# Patient Record
Sex: Female | Born: 1965 | Race: Black or African American | Hispanic: No | Marital: Single
Health system: Southern US, Academic
[De-identification: ages and names within clinical notes are randomized; demographics above are authoritative.]

## PROBLEM LIST (undated history)

## (undated) ENCOUNTER — Ambulatory Visit

## (undated) ENCOUNTER — Other Ambulatory Visit

## (undated) ENCOUNTER — Encounter

## (undated) ENCOUNTER — Encounter: Attending: Nephrology | Primary: Nephrology

## (undated) ENCOUNTER — Ambulatory Visit: Payer: MEDICARE

## (undated) ENCOUNTER — Inpatient Hospital Stay

## (undated) ENCOUNTER — Telehealth

## (undated) ENCOUNTER — Encounter: Payer: MEDICARE | Attending: Nephrology | Primary: Nephrology

## (undated) ENCOUNTER — Encounter: Attending: Adult Health | Primary: Adult Health

## (undated) ENCOUNTER — Ambulatory Visit: Payer: Medicare (Managed Care)

## (undated) ENCOUNTER — Ambulatory Visit: Payer: Medicare (Managed Care) | Attending: Nephrology | Primary: Nephrology

## (undated) ENCOUNTER — Ambulatory Visit: Payer: MEDICARE | Attending: Nephrology | Primary: Nephrology

## (undated) ENCOUNTER — Inpatient Hospital Stay: Payer: Medicare (Managed Care)

## (undated) ENCOUNTER — Encounter: Payer: MEDICARE | Attending: General Practice | Primary: General Practice

## (undated) ENCOUNTER — Non-Acute Institutional Stay: Payer: MEDICARE

## (undated) DIAGNOSIS — I871 Compression of vein: Secondary | ICD-10-CM

## (undated) DIAGNOSIS — E119 Type 2 diabetes mellitus without complications: Secondary | ICD-10-CM

## (undated) DIAGNOSIS — I499 Cardiac arrhythmia, unspecified: Secondary | ICD-10-CM

## (undated) DIAGNOSIS — Z8614 Personal history of Methicillin resistant Staphylococcus aureus infection: Secondary | ICD-10-CM

## (undated) DIAGNOSIS — D0512 Intraductal carcinoma in situ of left breast: Secondary | ICD-10-CM

## (undated) DIAGNOSIS — I639 Cerebral infarction, unspecified: Secondary | ICD-10-CM

## (undated) DIAGNOSIS — I340 Nonrheumatic mitral (valve) insufficiency: Secondary | ICD-10-CM

## (undated) DIAGNOSIS — T82868A Thrombosis of vascular prosthetic devices, implants and grafts, initial encounter: Secondary | ICD-10-CM

## (undated) DIAGNOSIS — G40909 Epilepsy, unspecified, not intractable, without status epilepticus: Secondary | ICD-10-CM

## (undated) DIAGNOSIS — Z992 Dependence on renal dialysis: Secondary | ICD-10-CM

## (undated) DIAGNOSIS — Z940 Kidney transplant status: Secondary | ICD-10-CM

## (undated) DIAGNOSIS — Z9289 Personal history of other medical treatment: Secondary | ICD-10-CM

## (undated) DIAGNOSIS — A419 Sepsis, unspecified organism: Secondary | ICD-10-CM

## (undated) DIAGNOSIS — F419 Anxiety disorder, unspecified: Secondary | ICD-10-CM

## (undated) DIAGNOSIS — F32A Depression, unspecified: Secondary | ICD-10-CM

## (undated) DIAGNOSIS — R0789 Other chest pain: Secondary | ICD-10-CM

## (undated) DIAGNOSIS — N186 End stage renal disease: Secondary | ICD-10-CM

## (undated) DIAGNOSIS — I619 Nontraumatic intracerebral hemorrhage, unspecified: Secondary | ICD-10-CM

## (undated) DIAGNOSIS — I219 Acute myocardial infarction, unspecified: Secondary | ICD-10-CM

## (undated) DIAGNOSIS — K219 Gastro-esophageal reflux disease without esophagitis: Secondary | ICD-10-CM

## (undated) DIAGNOSIS — I10 Essential (primary) hypertension: Secondary | ICD-10-CM

## (undated) DIAGNOSIS — R569 Unspecified convulsions: Secondary | ICD-10-CM

## (undated) DIAGNOSIS — D7582 Heparin induced thrombocytopenia (HIT): Secondary | ICD-10-CM

## (undated) DIAGNOSIS — G2581 Restless legs syndrome: Secondary | ICD-10-CM

## (undated) DIAGNOSIS — D75829 Heparin-induced thrombocytopenia, unspecified: Secondary | ICD-10-CM

## (undated) DIAGNOSIS — M1990 Unspecified osteoarthritis, unspecified site: Secondary | ICD-10-CM

## (undated) HISTORY — DX: Essential (primary) hypertension: I10

## (undated) HISTORY — DX: Kidney transplant status: Z94.0

## (undated) HISTORY — DX: Other chest pain: R07.89

## (undated) HISTORY — DX: Gastro-esophageal reflux disease without esophagitis: K21.9

## (undated) HISTORY — DX: Thrombosis due to vascular prosthetic devices, implants and grafts, initial encounter: T82.868A

## (undated) HISTORY — DX: Dependence on renal dialysis: Z99.2

## (undated) HISTORY — DX: Cerebral infarction, unspecified: I63.9

## (undated) HISTORY — PX: INSERTION OF DIALYSIS CATHETER: SHX1324

## (undated) HISTORY — DX: Personal history of other medical treatment: Z92.89

## (undated) HISTORY — DX: Heparin induced thrombocytopenia (HIT): D75.82

## (undated) HISTORY — DX: Intraductal carcinoma in situ of left breast: D05.12

## (undated) HISTORY — DX: Nonrheumatic mitral (valve) insufficiency: I34.0

## (undated) HISTORY — DX: Heparin-induced thrombocytopenia, unspecified: D75.829

## (undated) HISTORY — DX: Restless legs syndrome: G25.81

## (undated) HISTORY — DX: Epilepsy, unspecified, not intractable, without status epilepticus: G40.909

## (undated) HISTORY — DX: End stage renal disease: N18.6

## (undated) HISTORY — DX: Sepsis, unspecified organism: A41.9

## (undated) HISTORY — DX: Cardiac arrhythmia, unspecified: I49.9

## (undated) HISTORY — DX: Nontraumatic intracerebral hemorrhage, unspecified: I61.9

## (undated) HISTORY — DX: Personal history of Methicillin resistant Staphylococcus aureus infection: Z86.14

## (undated) HISTORY — DX: Compression of vein: I87.1

---
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
## 2005-10-31 ENCOUNTER — Emergency Department: Payer: Self-pay | Admitting: General Practice

## 2006-04-08 ENCOUNTER — Ambulatory Visit: Payer: Self-pay

## 2007-02-12 ENCOUNTER — Ambulatory Visit: Payer: Self-pay | Admitting: Nephrology

## 2007-03-26 ENCOUNTER — Ambulatory Visit: Payer: Self-pay | Admitting: Nephrology

## 2007-06-16 ENCOUNTER — Ambulatory Visit: Payer: Self-pay | Admitting: Internal Medicine

## 2008-05-01 ENCOUNTER — Inpatient Hospital Stay: Payer: Self-pay | Admitting: *Deleted

## 2008-05-01 ENCOUNTER — Other Ambulatory Visit: Payer: Self-pay

## 2008-08-03 DIAGNOSIS — I639 Cerebral infarction, unspecified: Secondary | ICD-10-CM

## 2008-08-03 HISTORY — DX: Cerebral infarction, unspecified: I63.9

## 2008-10-26 ENCOUNTER — Ambulatory Visit: Payer: Self-pay | Admitting: Pulmonary Disease

## 2008-10-26 ENCOUNTER — Inpatient Hospital Stay (HOSPITAL_COMMUNITY): Admission: AD | Admit: 2008-10-26 | Discharge: 2008-11-01 | Payer: Self-pay | Admitting: Internal Medicine

## 2008-10-26 ENCOUNTER — Emergency Department: Payer: Self-pay | Admitting: Emergency Medicine

## 2008-10-30 ENCOUNTER — Ambulatory Visit: Payer: Self-pay | Admitting: Physical Medicine & Rehabilitation

## 2008-11-08 ENCOUNTER — Emergency Department: Payer: Self-pay | Admitting: Unknown Physician Specialty

## 2008-11-10 ENCOUNTER — Emergency Department: Payer: Self-pay | Admitting: Unknown Physician Specialty

## 2008-11-24 ENCOUNTER — Ambulatory Visit: Payer: Self-pay | Admitting: Internal Medicine

## 2010-04-02 IMAGING — CR DG CHEST 1V PORT
1 series · 1 of 1 positions shown · non-contrast
Comparison: none

REASON FOR EXAM: Shortness of breath
COMMENTS:

[view not recorded]
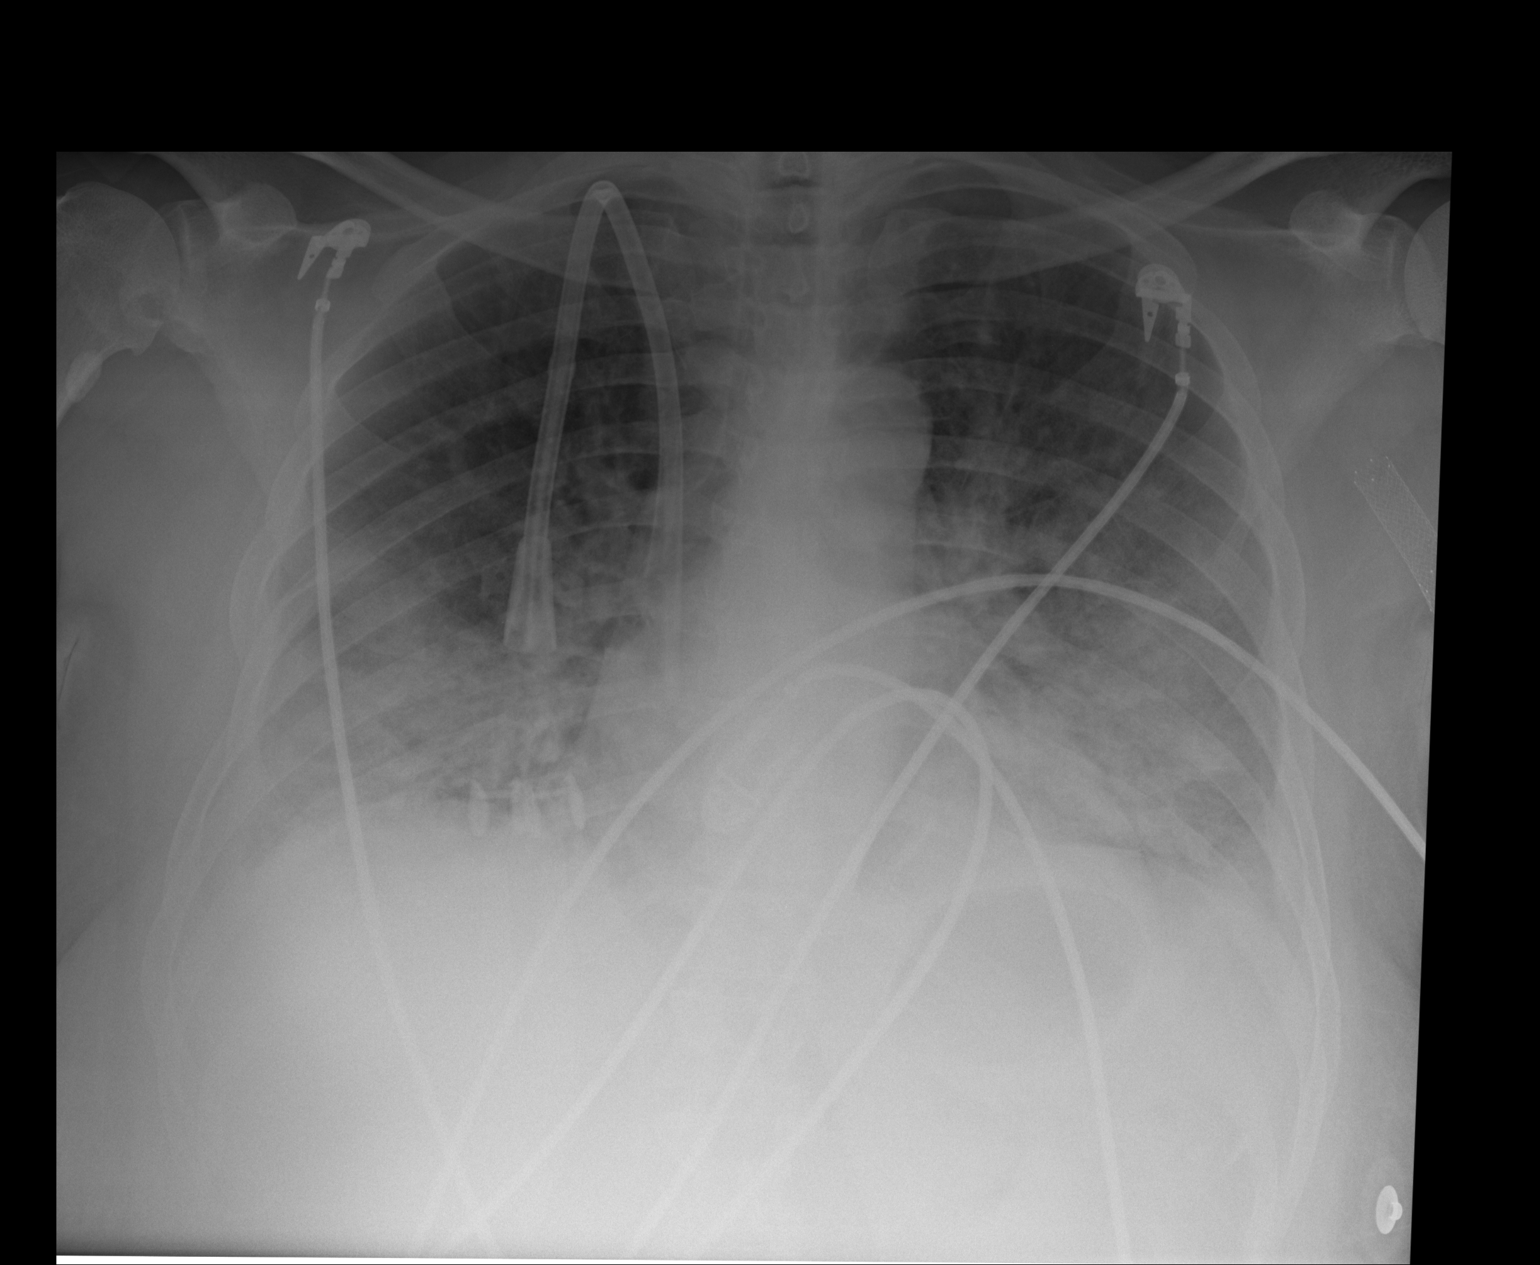

[1 of 1 positions shown; findings below may reference images not displayed]

PROCEDURE:     DXR - DXR PORTABLE CHEST SINGLE VIEW  - May 01, 2008  [DATE]

RESULT:     There is no previous exam available for comparison.

There is a large caliber central venous catheter present with two lumens.
The distal port is in the RIGHT atrium. The proximal port is in the inferior
portion of the superior vena cava. There is evidence of pulmonary vascular
congestion with pulmonary edema. Superimposed basilar pneumonia would be
difficult to completely exclude. Cardiac monitoring electrodes are present.
IMPRESSION: Pulmonary edema. Follow-up is recommended.

## 2010-04-19 ENCOUNTER — Emergency Department: Payer: Self-pay | Admitting: Emergency Medicine

## 2010-07-22 ENCOUNTER — Emergency Department: Payer: Self-pay | Admitting: Emergency Medicine

## 2010-09-01 ENCOUNTER — Emergency Department: Payer: Self-pay | Admitting: Emergency Medicine

## 2010-11-12 LAB — RENAL FUNCTION PANEL
BUN: 32 mg/dL — ABNORMAL HIGH (ref 6–23)
Chloride: 99 mEq/L (ref 96–112)
Glucose, Bld: 72 mg/dL (ref 70–99)
Potassium: 4.5 mEq/L (ref 3.5–5.1)

## 2010-11-13 LAB — CBC
Hemoglobin: 12.1 g/dL (ref 12.0–15.0)
Hemoglobin: 12.7 g/dL (ref 12.0–15.0)
MCHC: 33.9 g/dL (ref 30.0–36.0)
MCHC: 34.3 g/dL (ref 30.0–36.0)
MCHC: 34.5 g/dL (ref 30.0–36.0)
MCV: 95.6 fL (ref 78.0–100.0)
MCV: 95.6 fL (ref 78.0–100.0)
Platelets: 179 10*3/uL (ref 150–400)
Platelets: 263 10*3/uL (ref 150–400)
RBC: 3.54 MIL/uL — ABNORMAL LOW (ref 3.87–5.11)
RBC: 3.66 MIL/uL — ABNORMAL LOW (ref 3.87–5.11)
RBC: 3.85 MIL/uL — ABNORMAL LOW (ref 3.87–5.11)
WBC: 10 10*3/uL (ref 4.0–10.5)
WBC: 10.1 10*3/uL (ref 4.0–10.5)
WBC: 9.8 10*3/uL (ref 4.0–10.5)
WBC: 9.9 10*3/uL (ref 4.0–10.5)

## 2010-11-13 LAB — BASIC METABOLIC PANEL
BUN: 21 mg/dL (ref 6–23)
CO2: 24 mEq/L (ref 19–32)
CO2: 26 mEq/L (ref 19–32)
Calcium: 7.9 mg/dL — ABNORMAL LOW (ref 8.4–10.5)
Calcium: 8.6 mg/dL (ref 8.4–10.5)
Calcium: 9 mg/dL (ref 8.4–10.5)
Chloride: 106 mEq/L (ref 96–112)
Creatinine, Ser: 10.64 mg/dL — ABNORMAL HIGH (ref 0.4–1.2)
Creatinine, Ser: 11.19 mg/dL — ABNORMAL HIGH (ref 0.4–1.2)
GFR calc Af Amer: 4 mL/min — ABNORMAL LOW (ref >60)
GFR calc Af Amer: 5 mL/min — ABNORMAL LOW (ref >60)
GFR calc non Af Amer: 3 mL/min — ABNORMAL LOW (ref >60)
GFR calc non Af Amer: 4 mL/min — ABNORMAL LOW (ref >60)
Glucose, Bld: 103 mg/dL — ABNORMAL HIGH (ref 70–99)
Sodium: 135 mEq/L (ref 135–145)
Sodium: 138 mEq/L (ref 135–145)

## 2010-11-13 LAB — BLOOD GAS, ARTERIAL
Acid-Base Excess: 2.5 mmol/L — ABNORMAL HIGH (ref 0.0–2.0)
Acid-base deficit: 1.5 mmol/L (ref 0.0–2.0)
Acid-base deficit: 1.6 mmol/L (ref 0.0–2.0)
Acid-base deficit: 1.8 mmol/L (ref 0.0–2.0)
Bicarbonate: 22.8 mEq/L (ref 20.0–24.0)
Bicarbonate: 23.8 mEq/L (ref 20.0–24.0)
Drawn by: 311491
FIO2: 0.4 %
FIO2: 0.4 %
FIO2: 100 %
MECHVT: 500 mL
O2 Saturation: 93.7 %
O2 Saturation: 98.6 %
O2 Saturation: 98.7 %
PEEP: 5 cmH2O
PEEP: 5 cmH2O
Patient temperature: 100
Patient temperature: 98.1
RATE: 12 resp/min
RATE: 12 resp/min
pCO2 arterial: 35.6 mmHg (ref 35.0–45.0)
pCO2 arterial: 44.3 mmHg (ref 35.0–45.0)
pH, Arterial: 7.397 (ref 7.350–7.400)
pO2, Arterial: 104 mmHg — ABNORMAL HIGH (ref 80.0–100.0)
pO2, Arterial: 130 mmHg — ABNORMAL HIGH (ref 80.0–100.0)
pO2, Arterial: 326 mmHg — ABNORMAL HIGH (ref 80.0–100.0)

## 2010-11-13 LAB — RENAL FUNCTION PANEL
Albumin: 2.7 g/dL — ABNORMAL LOW (ref 3.5–5.2)
Albumin: 3 g/dL — ABNORMAL LOW (ref 3.5–5.2)
Calcium: 8.6 mg/dL (ref 8.4–10.5)
Chloride: 104 mEq/L (ref 96–112)
Chloride: 105 mEq/L (ref 96–112)
Creatinine, Ser: 12.94 mg/dL — ABNORMAL HIGH (ref 0.4–1.2)
GFR calc Af Amer: 3 mL/min — ABNORMAL LOW (ref >60)
GFR calc Af Amer: 4 mL/min — ABNORMAL LOW (ref >60)
GFR calc non Af Amer: 3 mL/min — ABNORMAL LOW (ref >60)
GFR calc non Af Amer: 3 mL/min — ABNORMAL LOW (ref >60)
Phosphorus: 5.3 mg/dL — ABNORMAL HIGH (ref 2.3–4.6)
Phosphorus: 5.3 mg/dL — ABNORMAL HIGH (ref 2.3–4.6)
Potassium: 6.5 mEq/L (ref 3.5–5.1)

## 2010-11-13 LAB — GLUCOSE, CAPILLARY
Glucose-Capillary: 102 mg/dL — ABNORMAL HIGH (ref 70–99)
Glucose-Capillary: 105 mg/dL — ABNORMAL HIGH (ref 70–99)
Glucose-Capillary: 105 mg/dL — ABNORMAL HIGH (ref 70–99)
Glucose-Capillary: 110 mg/dL — ABNORMAL HIGH (ref 70–99)
Glucose-Capillary: 85 mg/dL (ref 70–99)
Glucose-Capillary: 88 mg/dL (ref 70–99)
Glucose-Capillary: 99 mg/dL (ref 70–99)

## 2010-11-13 LAB — HEPATIC FUNCTION PANEL
Alkaline Phosphatase: 124 U/L — ABNORMAL HIGH (ref 39–117)
Bilirubin, Direct: 0.2 mg/dL (ref 0.0–0.3)
Indirect Bilirubin: 0.5 mg/dL (ref 0.3–0.9)
Total Bilirubin: 0.7 mg/dL (ref 0.3–1.2)

## 2010-11-13 LAB — MAGNESIUM
Magnesium: 2.2 mg/dL (ref 1.5–2.5)
Magnesium: 2.4 mg/dL (ref 1.5–2.5)

## 2010-11-13 LAB — APTT: aPTT: 29 seconds (ref 24–37)

## 2010-11-13 LAB — PROTIME-INR: Prothrombin Time: 13.9 seconds (ref 11.6–15.2)

## 2010-11-13 LAB — COMPREHENSIVE METABOLIC PANEL
CO2: 26 mEq/L (ref 19–32)
Calcium: 8.4 mg/dL (ref 8.4–10.5)
Creatinine, Ser: 10.97 mg/dL — ABNORMAL HIGH (ref 0.4–1.2)
GFR calc non Af Amer: 4 mL/min — ABNORMAL LOW (ref >60)
Glucose, Bld: 101 mg/dL — ABNORMAL HIGH (ref 70–99)

## 2010-12-16 NOTE — Procedures (Signed)
CLINICAL HISTORY:  A 46 year old female was found to have seizure, CT  scan demonstrate a right frontal lobe bleeding.   CURRENT MEDICATIONS:  Dilantin, Coumadin, Cozaar, clonidine, Norvasc,  Tylenol, Renagel, Nasonex, Versed, Fentanyl.   TECHNICAL COMMENT:  An 18-channel EEG was performed based on standard  international 10-20 system.  Total recording time is 21.2 minutes,  seventeenth channel dedicated to the EKG which has demonstrated normal  sinus rhythm of 78 beats per minute.   Upon awakening, the posterior background activity was diffusely low in  the theta range.  The anterior background activity was in the slower  range delta range activity, very low amplitude.   There are also intermittent right side slower activity, but there is no  evidence of epileptiform discharge.  There was occasionally C4 sharp  transient noticed, there was also frequent pulse artifact, but there was  no evidence of epileptiform discharge.  Photic stimulation and  hyperventilation was not performed.   In conclusion, this is an abnormal study because generalized slowing  indicating a cerebral dysfunction,  could be due to sedative effect of  Versed,fentanyl, or metabolic -toxic reasons.  There is no evidence of  epileptiform discharge.      Marcial Pacas, MD  Electronically Signed     [id number]  D:  10/26/2008 22:35:45  T:  10/27/2008 05:00:14  Job #:  [id number]

## 2010-12-16 NOTE — Consult Note (Signed)
[name], [name]              ACCOUNT NO.:  [id number]   MEDICAL RECORD NO.:  [id number]          PATIENT TYPE:  INP   LOCATION:  3109                         FACILITY:  [place]   PHYSICIAN:  Kary Kos, M.D.        DATE OF BIRTH:  10/27/1965   DATE OF CONSULTATION:  10/26/2008  DATE OF DISCHARGE:                                 CONSULTATION   REASON FOR CONSULTATION:  Right frontal lobar intracerebral hematoma.   HISTORY OF PRESENT ILLNESS:  The patient is a 45 year old female with  limited available history, but was known as she has end-stage renal  disease, history of HITT, CHF, hypertension who presented after being  found by her boyfriend seizing.  EMS picked her up.  No witnessed  seizures by EMS.  The patient was intubated, taken to [place].  On  arrival to [place], reportedly the patient's systolic pressures were in  excess of 200, and the patient underwent workup including a CT of her  head showing a lobar hypertensive hemorrhage and the patient was  transferred here.  Reportedly, the patient also has been on Coumadin and  anticoagulated.  Although the coags from [place] show her INR to be  1.1.   Remainder of her past medical history is not available nor surgical  history at this time.   PHYSICAL EXAMINATION:  The patient is obtunded and sedated on Diprivan.  Pupils are 2-mm and nonreactive.  She appears to be symmetric,  purposeful upper and lower extremities, but certainly does not follow  command secondary to sedation.   On review of her CT scan, there is a small 3 x 4 cm right frontal  intracerebral hematoma with very minimal mass effect, no midline shift,  all local.  No other masses, lesions, or hemorrhages visualized.   ASSESSMENT:  This is a 45 year old female with significant past medical  history and coagulopathy who probably has a hypertensive hemorrhage,  though etiology is still somewhat unclear.  History is also limited at  this time until  additional family will come and be available to obtain  from.  The patient has been admitted to critical care.  Recommend  continued aggressive hypertensive management.  Continue reverse  coagulopathy, although INR is already 1.1 so most wound healing being  done was probably dysfunctional platelets, seizure prophylaxis, and  initiate Dilantin.  Neurology consult for management of seizures and  also continue workup etiology of the hemorrhage.           ______________________________  Kary Kos, M.D.     GC/MEDQ  D:  10/26/2008  T:  10/26/2008  Job:  [id number]

## 2010-12-16 NOTE — Consult Note (Signed)
[name], [name]              ACCOUNT NO.:  [id number]   MEDICAL RECORD NO.:  [id number]          PATIENT TYPE:  INP   LOCATION:  3109                         FACILITY:  [place]   PHYSICIAN:  Marcial Pacas, MD          DATE OF BIRTH:  10/27/1965   DATE OF CONSULTATION:  DATE OF DISCHARGE:                                 CONSULTATION   CHIEF COMPLAINT:  Intracranial bleeding and seizure.   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  CT of the brain showed a 4 x 3 x 6 cm right  frontal lobe acute hemorrhage, later transferred to [facility] for continued care.   Neurology is consulted for management of intracranial hemorrhage and  seizure.   REVIEW OF SYSTEMS:  Not obtainable.   PAST MEDICAL HISTORY:  Hypertension, chronic renal insufficiency,  hemodialysis dependent, heparin-induced thrombocytopenia, congestive  heart failure, Coumadin for unknown reasons.   PAST SURGICAL HISTORY:  Unknown.   FAMILY HISTORY:  Unknown.   SOCIAL HISTORY:  Not obtainable.   HOME MEDICATION:  Amlodipine, atenolol, clonidine, Cozaar, Prevacid,  renal tab multivitamin, Sensipar, and Coumadin 4 mg every day.   ALLERGIES:  HEPARIN, IBUPROFEN, PENICILLIN.   PHYSICAL EXAMINATION:  GENERAL:  She is intubated; on low-dose sedation,  Versed and fentanyl.  VITAL SIGNS:  Heart rate was is 80s, blood pressure now is 140/80.  CARDIAC:  Regular rate and  rhythm.  PULMONARY:  Clear to auscultation bilaterally.  NECK:  Supple.  No carotid bruits.  NEUROLOGIC:  She is moving four extremities, and on stimulation.  Cranial nerves II through XII, pupil was small, sluggish, reactive,  doll's eyes present, cornea brisk, breathing over vent.  Motor  examination, moves all extremities, withdrawal to pain, and seems to be  fairly symmetric.  Deep tendon reflexes were brisk, symmetric.  Plantar  responses were flexor.   CT of the head without contrast from [facility]  reviewed.  There was right frontal acute hemorrhage, 4 x 3 cm, involving  6 slice, calculated volume around 36 mL.   LABORATORY EVALUATION:  CMP, low sodium 133, elevated potassium 5.0,  creatinine 11, normal liver functional tests.  INR was 1.0, CBC elevated  WBC of 13.7.   ASSESSMENT AND PLAN:  A 45 year old female, with intracranial  hemorrhage, most likely due to hypertension.  Also, has comorbidity of  end-stage renal disease hemodialysis dependent, was on Coumadin  treatment for left upper extremity DVT.  1. Hold off anticoagulation.  2. Blood pressure control goal is 140-160/80-90.  3. Repeat CT head without contrast this afternoon.  4. Load with  Dilantin 20 mg/kg and then 100 mg t.i.d., based on      current examination, she is no longer actively seizing, EEG.  5. Continue hemodialysis.      Marcial Pacas, MD  Electronically Signed     YY/MEDQ  D:  10/26/2008  T:  10/27/2008  Job:  [id number]

## 2010-12-19 NOTE — Discharge Summary (Signed)
[name], [name]              ACCOUNT NO.:  [id number]   MEDICAL RECORD NO.:  [id number]          PATIENT TYPE:  INP   LOCATION:  6706                         FACILITY:  [place]   PHYSICIAN:  Donato Heinz, M.D.DATE OF BIRTH:  [birth year]   DATE OF ADMISSION:  10/26/2008  DATE OF DISCHARGE:  11/01/2008                               DISCHARGE SUMMARY   ADMISSION DIAGNOSES:  77. A 45 year old black female with acute right frontal hemorrhage.  2. Malignant hypertension.  3. Seizure activity.  4. History of heparin-induced thrombocytopenia-thrombosis.  5. History of end-stage renal disease on chronic hemodialysis.  6. History of congestive heart failure.   DISCHARGE DIAGNOSES:  1. Right frontal hemorrhage/cerebrovascular accident.  2. Malignant hypertension.  3. Seizure disorder.  4. End-stage renal disease on chronic hemodialysis.  5. Anemia.  6. Secondary hyperparathyroidism.  7. History of heparin-induced thrombocytopenia.  8. Nonfunctional arteriovenous fistula status post successful      dilatation of outflow vein stenosis by fistulogram and angioplasty.   BRIEF HISTORY/REASON FOR THE ADMISSION:  [name] is a 45 year old  black female with history of end-stage renal disease on chronic  hemodialysis at the [facility] followed by Dr. Rudi Coco at the [facility] found by her boyfriend seizing for an unknown  length of time and presented to [facility].  Initially,  she was noted then to have recurrent seizure with difficulty protecting  airway, subsequently intubated, blood pressure was elevated at 200/120  on presentation.  CT scan of her brain showed a 4 x 3 x 6-cm right  frontal lobe acute hemorrhage.  They transferred to [place] were continued to care.  Neurology was consulted  for management of intracranial hemorrhage and seizure.   PHYSICAL EXAMINATION:  On admission by Dr. Krista Blue showed:  GENERAL APPEARANCE:  A black female intubated on low-dose of adenosine  with Versed and fentanyl.  VITAL SIGNS:  Blood pressure was now 140/80, heart rate was in the 80s.  HEART:  She had regular rate and rhythm auscultated.  PULMONARY:  Lungs are clear to auscultation.  NECK:  Supple.  No carotid bruits heard.  NEUROLOGIC:  She was moving all 4 extremities upon stimulation.  Cranial  nerves II through XII, pupils were small and sluggish,  reactive.  Dull  eyes present, corneal brisk, breathing over the vent.  MOTOR:  Moves all extremities, withdraws to pain, seems to be fairly  symmetric.  Deep tendon reflexes were brisk, symmetric.  Plantar  responses were flexor.   CT scan of her head without contrast from [facility]  revealed that there was a right frontal acute hemorrhage 4 x 6 involving  6-slice calculated volume of around 36 mm.   LABORATORY EVALUATION:  CMP showed a sodium of 133, potassium 5.0, and  creatinine 11.  Normal liver function tests.  INR was 1.0.  CBC showed a  white count of 13.7.   INITIAL ASSESSMENT AND PLAN:  A 45 year old black female with  intracranial hemorrhage, most likely due to hypertension, had been on  Coumadin for treatment of  left upper extremity deep venous thrombosis.  Plan was to hold off anticoagulation for this time to control her blood  pressure having a goal range of 140-160/80-90 initially.  Repeat CT scan  of her head without contrast.  Load with Dilantin and continue on  hemodialysis as scheduled.   COURSE IN THE HOSPITAL:  1. Acute right frontal hemorrhage.  The patient was seen in consult by      Critical Care Service and Neurology Service.  Neurosurgical Service      was consulted initially.  Note:  She is a nonsurgical candidate at      this time, wanted Neurology Service to consult etiology of      hematoma, which was thought to be probably malignant hypertension,      which caused the seizing and bleed.  She was  loaded with IV      Dilantin.  Blood pressure was maintained and levels noted on      admission to prevent significant drops.  Vent was managed by      Critical Care Service.  An MRA of the head without contrast on      October 29, 2008.  Exam quality was limited by the patient's motion.      There is no large vessel occlusion, probable atherosclerotic      disease in both vertebral arteries.  Noted acute hematoma at the      right frontal lobe unchanged from prior CT scan most likely primary      hemorrhage related to the patient's hypertension.  Underlying      vascular malformation or amyloid angio were not enlarged.  There      was no acute ischemic infarct.  The right frontal hematoma measured      39 x 23 cm.   Note, the patient was on, at home, the medication list of amlodipine,  atenolol, clonidine, and Cozaar for hypertension.  There is a  questionable history of noncompliance, but this is on background of flow  sheets.  Diastolics are 0000000 and elevated systolics on all flow sheet  at the [facility].  __________hemodialysis weight gain is  5.   Note, the patient was extubated at a.m. on October 28, 2008.  Blood  pressure was somewhat elevated.  Started on p.o. Cozaar, clonidine, and  Norvasc.  1. Hypertension.  The patient was feeling better with blood pressure      fairly stable at 172/108 near the time of discharge and deemed      might be discharged to home by Dr. Marval Regal.  The patient was up,      ambulating.  For blood pressure medications, she did ask for      refills on her clonidine, amlodipine, which are written for her.      She was told to be more compliant or she might have another seizure-      type activity and more bleed.  2. End-stage renal disease.  The patient was continued on      hemodialysis.  She was using a right IJ PermCath, which was      inserted in some at the time at [facility] area.  Note, she did      have a fistulogram was to  be scheduled at [facility], but this was      ordered by the Renal Service and done on October 31, 2008, by      Radiology here.  She had successful dilatation of  outflow vein      stenosis in antecubital fossa to 6 mm.  She had some elevated      venous pressures noted when her fistula was attempted to be used      here.  Last day of dialysis on November 01, 2008, and we were using a      PermCath, was to use her AV fistula, when she returned to her home      unit in [place] by Dr. Rudi Coco to follow up on that.   Note, she is followed by [facility], Dr. Rudi Coco at  [facility] on Tuesday, Thursday, and Saturday as scheduled.  Her  dry weight will be 73.0 kg.  She will continue to use no-heparin  hemodialysis because of her intracranial bleed and history of positive  HIT.  1. Seizure disorder.  The patient was loaded initially on IV Dilantin      and continued on p.o. Dilantin at 300 mg at bedtime as ordered by      Neurology.  She will need to have a followup __________ total dose      of Dilantin as an outpatient at the dialysis unit.  Neurology      __________ the patient was noted on November 01, 2008, ready for      discharge to home with home health RN to continue with physical      therapy.  Follow up with Dr. Krista Blue in 2 months' time.  The MRI from      October 29, 2008, showed right frontal hemorrhage unchanged from      previous CAT scan.  Again, noted MRA showed no large vessel      occlusion.  Neurology exam on the day of discharge appeared cranial      nerve II through XII were normal per Neurology and her muscle tone      was normal, 5/5.  Sensation was intact.  2. Anemia.  The patient had a hemoglobin of 9.9 at the time of      discharge.  She is on Epogen at 2000 units every hemodialysis.  3. Secondary hyperparathyroidism.  She was noted to have calcium of      8.1, phosphorous of 4.8, in the setting of albumin at 2.7.  She is      on Sensipar 60 mg  daily, Renagel 800 mg 2 each meal, not on Vitamin      D by her records given during hospital stay.  Follow PTH per Dr.      Mervyn Skeeters as an outpatient at hemodialysis unit.  4. The patient noted in the past to have DVT and had been on Coumadin      as an outpatient, but this was stopped because of her bleed.  She      was noted to have subtherapeutic INR from outpatient records on      October 25, 2008, with INR noted below 1.5.   MEDICATIONS AT THE TIME OF DISCHARGE:  1. Clonidine/Catapres patch TTS-3 one to chest, every Saturday.  2. Amlodipine 5 mg at bedtime.  3. Cozaar 50 mg 2 at bedtime.  4. Sensipar 60 mg daily.  5. Renagel 800 mg 2 tablets each meal t.i.d.  6. Nephro-Vite daily.  7. Protonix 40 mg daily.  8. Dilantin 100 mg 3 at bedtime.  9. Lopressor 50 mg b.i.d.  10.Outpatient hemodialysis, Epogen 2000 every hemodialysis.   DIET:  Renal-failure diet, limit total fluids to 1200 mL  daily.   ACTIVITY:  As tolerated.  Have home health RN, PT, see the patient.   WOUND CARE:  Told to keep her PermCath dry and clean and no showers.   FOLLOWUP:  Follow up with the [facility] on Tuesday,  Thursday, and Saturday, Dr. Joycelyn Schmid Kiser's group.  Follow up with Dr.  Krista Blue, neurologist at [facility] in 2 months and she is instructed.   Discharge summary and planning took more than 30 minutes' time.      Foye Clock, P.A.    ______________________________  Donato Heinz, M.D.    DWZ/MEDQ  D:  11/07/2008  T:  11/08/2008  Job:  [id number]   cc:   Rudi Coco

## 2011-02-16 ENCOUNTER — Inpatient Hospital Stay: Payer: Self-pay | Admitting: Internal Medicine

## 2011-12-14 LAB — HM PAP SMEAR

## 2013-11-02 ENCOUNTER — Emergency Department: Payer: Self-pay | Admitting: Emergency Medicine

## 2013-11-02 LAB — COMPREHENSIVE METABOLIC PANEL
ALK PHOS: 286 U/L — AB
ALT: 19 U/L (ref 12–78)
Albumin: 3.6 g/dL (ref 3.4–5.0)
Anion Gap: 12 (ref 7–16)
BUN: 30 mg/dL — ABNORMAL HIGH (ref 7–18)
Bilirubin,Total: 0.3 mg/dL (ref 0.2–1.0)
CALCIUM: 8.1 mg/dL — AB (ref 8.5–10.1)
CREATININE: 10.81 mg/dL — AB (ref 0.60–1.30)
Chloride: 97 mmol/L — ABNORMAL LOW (ref 98–107)
Co2: 24 mmol/L (ref 21–32)
EGFR (African American): 4 — ABNORMAL LOW
EGFR (Non-African Amer.): 4 — ABNORMAL LOW
Glucose: 152 mg/dL — ABNORMAL HIGH (ref 65–99)
Osmolality: 276 (ref 275–301)
POTASSIUM: 3.9 mmol/L (ref 3.5–5.1)
SGOT(AST): 17 U/L (ref 15–37)
Sodium: 133 mmol/L — ABNORMAL LOW (ref 136–145)
Total Protein: 8.8 g/dL — ABNORMAL HIGH (ref 6.4–8.2)

## 2013-11-02 LAB — CK TOTAL AND CKMB (NOT AT ARMC)
CK, TOTAL: 202 U/L — AB
CK-MB: 1 ng/mL (ref 0.5–3.6)

## 2013-11-02 LAB — CBC
HCT: 44.2 % (ref 35.0–47.0)
HGB: 14.4 g/dL (ref 12.0–16.0)
MCH: 29.9 pg (ref 26.0–34.0)
MCHC: 32.5 g/dL (ref 32.0–36.0)
MCV: 92 fL (ref 80–100)
Platelet: 325 10*3/uL (ref 150–440)
RBC: 4.8 10*6/uL (ref 3.80–5.20)
RDW: 14.3 % (ref 11.5–14.5)
WBC: 8.9 10*3/uL (ref 3.6–11.0)

## 2013-11-02 LAB — APTT: Activated PTT: 38.3 secs — ABNORMAL HIGH (ref 23.6–35.9)

## 2013-11-02 LAB — PROTIME-INR
INR: 1
Prothrombin Time: 12.8 secs (ref 11.5–14.7)

## 2013-11-02 LAB — TROPONIN I: Troponin-I: <0.02 ng/mL

## 2013-11-03 ENCOUNTER — Telehealth: Payer: Self-pay

## 2013-11-03 NOTE — Telephone Encounter (Signed)
Called pt to sched f/u w/ Dr. Rockey Situ after visit to [facility] ED for arrhythmia on amiodarone.  Pt sched to be seen 11/17/13 @ 2:15.

## 2013-11-17 ENCOUNTER — Ambulatory Visit (INDEPENDENT_AMBULATORY_CARE_PROVIDER_SITE_OTHER): Payer: Medicare Other | Admitting: Cardiovascular Disease

## 2013-11-17 ENCOUNTER — Encounter: Payer: Self-pay | Admitting: Cardiovascular Disease

## 2013-11-17 ENCOUNTER — Encounter (INDEPENDENT_AMBULATORY_CARE_PROVIDER_SITE_OTHER): Payer: Self-pay

## 2013-11-17 VITALS — BP 128/98 | HR 80 | Ht 66.0 in | Wt 207.0 lb

## 2013-11-17 DIAGNOSIS — I498 Other specified cardiac arrhythmias: Secondary | ICD-10-CM

## 2013-11-17 DIAGNOSIS — I471 Supraventricular tachycardia: Secondary | ICD-10-CM

## 2013-11-17 DIAGNOSIS — I619 Nontraumatic intracerebral hemorrhage, unspecified: Secondary | ICD-10-CM

## 2013-11-17 DIAGNOSIS — G40909 Epilepsy, unspecified, not intractable, without status epilepticus: Secondary | ICD-10-CM

## 2013-11-17 DIAGNOSIS — I129 Hypertensive chronic kidney disease with stage 1 through stage 4 chronic kidney disease, or unspecified chronic kidney disease: Secondary | ICD-10-CM | POA: Insufficient documentation

## 2013-11-17 DIAGNOSIS — Z992 Dependence on renal dialysis: Secondary | ICD-10-CM

## 2013-11-17 DIAGNOSIS — I499 Cardiac arrhythmia, unspecified: Secondary | ICD-10-CM

## 2013-11-17 DIAGNOSIS — N186 End stage renal disease: Secondary | ICD-10-CM | POA: Insufficient documentation

## 2013-11-17 DIAGNOSIS — I10 Essential (primary) hypertension: Secondary | ICD-10-CM

## 2013-11-17 HISTORY — DX: Nontraumatic intracerebral hemorrhage, unspecified: I61.9

## 2013-11-17 HISTORY — DX: Dependence on renal dialysis: Z99.2

## 2013-11-17 MED ORDER — AMIODARONE HCL 200 MG PO TABS: 200.0000 mg | ORAL_TABLET | Freq: Two times a day (BID) | ORAL | Status: DC | PRN

## 2013-11-17 NOTE — Assessment & Plan Note (Signed)
Periodic tachycardia and hypotension on dialysis. Likely related to low dry weight

## 2013-11-17 NOTE — Assessment & Plan Note (Signed)
She has been on a transplant list for 7 years, followed at [facility].

## 2013-11-17 NOTE — Progress Notes (Signed)
Patient ID: [name], female    DOB: 01/30/1966, 48 y.o.   MRN: [id number]  HPI Comments: Ms. Otremba is a pleasant 48 year old woman with end-stage renal disease, on hemodialysis for the past 7 years, with history of seizure disorder, hypertension, stroke in March 2010 with intracranial bleed requiring intubation for at least 47 week at [facility], who has been unable to tolerate anticoagulation, who presents for narrow complex tachycardia with rate 160 beats per minute seen in the emergency room 11/02/2013. Prior hospital notes indicate history of MRSA in the past, restless leg syndrome, clot of the AV graft on her left arm, previously on Coumadin  She reports that she had severe allergies, sinus congestion, chest congestion on April 2. She was laying in bed when she had acute onset of tachycardia. Symptoms lasted 5 hours. She was not thinking clearly and finally went to the emergency room after family encouraged her to go. She had palpitations, shortness of breath, fatigue with malaise..  In the emergency room, she was given amiodarone infusion as it was unsure if she was in SVT or atrial flutter. Her rhythm broke and she was discharged on amiodarone by mouth twice a day.  In the last 2 weeks, she denies having any further arrhythmia or tachycardia. She has felt well with no complaints. She reports that she does have tachycardia during hemodialysis if her dry weight is set low. Recently she has been feeling well during dialysis. She is on a transplant list and reports having previous echocardiogram and stress test which were normal by her report.  EKG shows normal sinus rhythm with rate 80 beats per minute, no significant ST or T wave changes Laboratory hospital 11/03/2011 shows creatinine 10.8, normal cardiac enzymes, normal LFTs, sodium 133   Outpatient Encounter Prescriptions as of 11/17/2013  Medication Sig  . acetaminophen (TYLENOL) 500 MG tablet Take 500 mg by mouth every 6  (six) hours as needed.  Marland Kitchen amiodarone (PACERONE) 200 MG tablet Take 1 tablet (200 mg total) by mouth 2 (two) times daily as needed.  Marland Kitchen b complex-vitamin c-folic acid (NEPHRO-VITE) 0.8 MG TABS tablet Take 1 tablet by mouth at bedtime.  . cinacalcet (SENSIPAR) 60 MG tablet Take 60 mg by mouth daily.  Marland Kitchen levETIRAcetam (KEPPRA) 250 MG tablet Take 500 mg by mouth 2 (two) times daily. Takes also 250 mg when has dialysis days.  Marland Kitchen NASONEX 50 MCG/ACT nasal spray Place 2 sprays into the nose daily.   Marland Kitchen omeprazole (PRILOSEC) 20 MG capsule Take 20 mg by mouth daily.   Marland Kitchen rOPINIRole (REQUIP) 0.5 MG tablet Take 0.5 mg by mouth as needed.   . sevelamer (RENAGEL) 800 MG tablet Take 800 mg by mouth 3 (three) times daily with meals.  . sodium bicarbonate 325 MG tablet Take 325 mg by mouth daily.  . SPS 15 GM/60ML suspension Take 60 g by mouth once a week.    Review of Systems  Constitutional: Negative.   HENT: Negative.   Eyes: Negative.   Respiratory: Negative.   Cardiovascular: Negative.   Gastrointestinal: Negative.   Endocrine: Negative.   Musculoskeletal: Negative.   Skin: Negative.   Allergic/Immunologic: Negative.   Neurological: Negative.   Hematological: Negative.   Psychiatric/Behavioral: Negative.   All other systems reviewed and are negative.   BP 128/98  Pulse 80  Ht 5\' 6"  (1.676 m)  Wt 207 lb (93.895 kg)  BMI 33.43 kg/m2  Physical Exam  Nursing note and vitals reviewed. Constitutional: She is oriented  to person, place, and time. She appears well-developed and well-nourished.  HENT:  Head: Normocephalic.  Nose: Nose normal.  Mouth/Throat: Oropharynx is clear and moist.  Eyes: Conjunctivae are normal. Pupils are equal, round, and reactive to light.  Neck: Normal range of motion. Neck supple. No JVD present.  Cardiovascular: Normal rate, regular rhythm, S1 normal, S2 normal, normal heart sounds and intact distal pulses.  Exam reveals no gallop and no friction rub.   No murmur  heard. Pulmonary/Chest: Effort normal and breath sounds normal. No respiratory distress. She has no wheezes. She has no rales. She exhibits no tenderness.  Abdominal: Soft. Bowel sounds are normal. She exhibits no distension. There is no tenderness.  Musculoskeletal: Normal range of motion. She exhibits no edema and no tenderness.  Lymphadenopathy:    She has no cervical adenopathy.  Neurological: She is alert and oriented to person, place, and time. Coordination normal.  Skin: Skin is warm and dry. No rash noted. No erythema.  Psychiatric: She has a normal mood and affect. Her behavior is normal. Judgment and thought content normal.    Assessment and Plan

## 2013-11-17 NOTE — Assessment & Plan Note (Signed)
Blood pressure is well controlled on today's visit. No changes made to the medications. We did talk about starting low-dose metoprolol for heart rate control. She reports having hypotension in the past

## 2013-11-17 NOTE — Patient Instructions (Addendum)
You are doing well. Please decrease the amiodarone down to one a day  If you go back into the fast rhythm Take 2 amiodarone pills,lay on the bed Wait a few hour (2 hours) Then take 2 more amiodarone Call the office  Please call us if you have new issues that need to be addressed before your next appt.  Your physician wants you to follow-up in: 6 months.  You will receive a reminder letter in the mail two months in advance. If you don't receive a letter, please call our office to schedule the follow-up appointment.

## 2013-11-17 NOTE — Assessment & Plan Note (Signed)
Rhythm appears to be SVT though unable to exclude other arrhythmia such as atrial flutter. As she is unable to be anticoagulated, given her prior history of intracranial bleed, we will continue her on low-dose amiodarone 200 mg daily. We have recommended if she has further episodes of profound tachycardia concerning for arrhythmia, that she take 2 of her amiodarone pills, weight several hours and then repeat 2 more pills if she remains any tachycardia. Also recommended that she call our office immediately. No anticoagulation at this time. We'll try to obtain prior echocardiogram and stress test done in 2014.

## 2013-11-17 NOTE — Assessment & Plan Note (Signed)
Prior stroke in 2010. No anticoagulation at this time

## 2014-03-03 HISTORY — PX: KIDNEY TRANSPLANT: SHX239

## 2014-03-24 DIAGNOSIS — Z940 Kidney transplant status: Secondary | ICD-10-CM | POA: Insufficient documentation

## 2014-03-24 DIAGNOSIS — K219 Gastro-esophageal reflux disease without esophagitis: Secondary | ICD-10-CM | POA: Insufficient documentation

## 2014-03-24 HISTORY — DX: Kidney transplant status: Z94.0

## 2014-03-30 ENCOUNTER — Other Ambulatory Visit: Payer: Self-pay | Admitting: Nephrology

## 2014-03-30 LAB — CBC WITH DIFFERENTIAL/PLATELET
Basophil #: 0.1 10*3/uL (ref 0.0–0.1)
Basophil %: 0.5 %
EOS PCT: 0 %
Eosinophil #: 0 10*3/uL (ref 0.0–0.7)
HCT: 27.2 % — AB (ref 35.0–47.0)
HGB: 8.5 g/dL — ABNORMAL LOW (ref 12.0–16.0)
Lymphocyte #: 0.1 10*3/uL — ABNORMAL LOW (ref 1.0–3.6)
Lymphocyte %: 0.6 %
MCH: 29.5 pg (ref 26.0–34.0)
MCHC: 31.4 g/dL — ABNORMAL LOW (ref 32.0–36.0)
MCV: 94 fL (ref 80–100)
MONO ABS: 0.8 x10 3/mm (ref 0.2–0.9)
Monocyte %: 7.1 %
NEUTROS ABS: 10.8 10*3/uL — AB (ref 1.4–6.5)
NEUTROS PCT: 91.8 %
Platelet: 260 10*3/uL (ref 150–440)
RBC: 2.9 10*6/uL — ABNORMAL LOW (ref 3.80–5.20)
RDW: 13.9 % (ref 11.5–14.5)
WBC: 11.7 10*3/uL — ABNORMAL HIGH (ref 3.6–11.0)

## 2014-03-30 LAB — MAGNESIUM: Magnesium: 1.7 mg/dL — ABNORMAL LOW

## 2014-03-30 LAB — LIPID PANEL
CHOLESTEROL: 139 mg/dL (ref 0–200)
HDL Cholesterol: 31 mg/dL — ABNORMAL LOW (ref 40–60)
LDL CHOLESTEROL, CALC: 66 mg/dL (ref 0–100)
TRIGLYCERIDES: 210 mg/dL — AB (ref 0–200)
VLDL CHOLESTEROL, CALC: 42 mg/dL — AB (ref 5–40)

## 2014-03-30 LAB — COMPREHENSIVE METABOLIC PANEL
ALBUMIN: 3.1 g/dL — AB (ref 3.4–5.0)
ALT: 22 U/L
ANION GAP: 11 (ref 7–16)
AST: 12 U/L — AB (ref 15–37)
Alkaline Phosphatase: 272 U/L — ABNORMAL HIGH
BILIRUBIN TOTAL: 0.7 mg/dL (ref 0.2–1.0)
BUN: 21 mg/dL — ABNORMAL HIGH (ref 7–18)
CO2: 19 mmol/L — AB (ref 21–32)
CREATININE: 2.44 mg/dL — AB (ref 0.60–1.30)
Calcium, Total: 7.2 mg/dL — ABNORMAL LOW (ref 8.5–10.1)
Chloride: 107 mmol/L (ref 98–107)
EGFR (Non-African Amer.): 23 — ABNORMAL LOW
GFR CALC AF AMER: 26 — AB
GLUCOSE: 102 mg/dL — AB (ref 65–99)
Osmolality: 277 (ref 275–301)
Potassium: 3.9 mmol/L (ref 3.5–5.1)
Sodium: 137 mmol/L (ref 136–145)
Total Protein: 7.1 g/dL (ref 6.4–8.2)

## 2014-03-30 LAB — PHOSPHORUS: Phosphorus: 2.7 mg/dL (ref 2.5–4.9)

## 2014-03-30 LAB — ALT: ALT: 23 U/L

## 2014-03-30 LAB — GAMMA GT: GGT: 61 U/L (ref 5–85)

## 2014-04-02 LAB — BASIC METABOLIC PANEL
Anion Gap: 10 (ref 7–16)
BUN: 16 mg/dL (ref 7–18)
Calcium, Total: 7.1 mg/dL — ABNORMAL LOW (ref 8.5–10.1)
Chloride: 107 mmol/L (ref 98–107)
Co2: 19 mmol/L — ABNORMAL LOW (ref 21–32)
Creatinine: 2.34 mg/dL — ABNORMAL HIGH (ref 0.60–1.30)
GFR CALC AF AMER: 28 — AB
GFR CALC NON AF AMER: 24 — AB
Glucose: 101 mg/dL — ABNORMAL HIGH (ref 65–99)
OSMOLALITY: 273 (ref 275–301)
Potassium: 4.3 mmol/L (ref 3.5–5.1)
Sodium: 136 mmol/L (ref 136–145)

## 2014-04-02 LAB — CBC WITH DIFFERENTIAL/PLATELET
Basophil #: 0.1 10*3/uL (ref 0.0–0.1)
Basophil %: 1.2 %
EOS ABS: 0 10*3/uL (ref 0.0–0.7)
EOS PCT: 0.1 %
HCT: 26 % — ABNORMAL LOW (ref 35.0–47.0)
HGB: 8.5 g/dL — AB (ref 12.0–16.0)
Lymphocyte #: 0.2 10*3/uL — ABNORMAL LOW (ref 1.0–3.6)
Lymphocyte %: 2.5 %
MCH: 30.3 pg (ref 26.0–34.0)
MCHC: 32.5 g/dL (ref 32.0–36.0)
MCV: 93 fL (ref 80–100)
MONO ABS: 0.7 x10 3/mm (ref 0.2–0.9)
Monocyte %: 8 %
NEUTROS ABS: 7.8 10*3/uL — AB (ref 1.4–6.5)
NEUTROS PCT: 88.2 %
Platelet: 417 10*3/uL (ref 150–440)
RBC: 2.79 10*6/uL — AB (ref 3.80–5.20)
RDW: 14.1 % (ref 11.5–14.5)
WBC: 8.9 10*3/uL (ref 3.6–11.0)

## 2014-04-02 LAB — MAGNESIUM: Magnesium: 1.9 mg/dL

## 2014-04-02 LAB — PHOSPHORUS: PHOSPHORUS: 3.7 mg/dL (ref 2.5–4.9)

## 2014-04-03 ENCOUNTER — Other Ambulatory Visit: Payer: Self-pay | Admitting: Nephrology

## 2014-04-06 LAB — CBC WITH DIFFERENTIAL/PLATELET
Basophil #: 0.1 10*3/uL (ref 0.0–0.1)
Basophil %: 1 %
Eosinophil #: 0 10*3/uL (ref 0.0–0.7)
Eosinophil %: 0.1 %
HCT: 25.4 % — ABNORMAL LOW (ref 35.0–47.0)
HGB: 7.9 g/dL — ABNORMAL LOW (ref 12.0–16.0)
LYMPHS ABS: 0.1 10*3/uL — AB (ref 1.0–3.6)
LYMPHS PCT: 1.2 %
MCH: 28.9 pg (ref 26.0–34.0)
MCHC: 31.3 g/dL — ABNORMAL LOW (ref 32.0–36.0)
MCV: 92 fL (ref 80–100)
Monocyte #: 0.6 x10 3/mm (ref 0.2–0.9)
Monocyte %: 7.3 %
Neutrophil #: 7.4 10*3/uL — ABNORMAL HIGH (ref 1.4–6.5)
Neutrophil %: 90.4 %
Platelet: 426 10*3/uL (ref 150–440)
RBC: 2.75 10*6/uL — ABNORMAL LOW (ref 3.80–5.20)
RDW: 14.4 % (ref 11.5–14.5)
WBC: 8.2 10*3/uL (ref 3.6–11.0)

## 2014-04-06 LAB — BASIC METABOLIC PANEL
ANION GAP: 9 (ref 7–16)
BUN: 14 mg/dL (ref 7–18)
CO2: 20 mmol/L — AB (ref 21–32)
Calcium, Total: 7.6 mg/dL — ABNORMAL LOW (ref 8.5–10.1)
Chloride: 107 mmol/L (ref 98–107)
Creatinine: 2.4 mg/dL — ABNORMAL HIGH (ref 0.60–1.30)
EGFR (African American): 27 — ABNORMAL LOW
EGFR (Non-African Amer.): 23 — ABNORMAL LOW
Glucose: 104 mg/dL — ABNORMAL HIGH (ref 65–99)
OSMOLALITY: 273 (ref 275–301)
Potassium: 4.3 mmol/L (ref 3.5–5.1)
Sodium: 136 mmol/L (ref 136–145)

## 2014-04-06 LAB — PHOSPHORUS: Phosphorus: 4.8 mg/dL (ref 2.5–4.9)

## 2014-04-06 LAB — MAGNESIUM: MAGNESIUM: 1.9 mg/dL

## 2014-04-10 LAB — CBC WITH DIFFERENTIAL/PLATELET
BASOS PCT: 0.8 %
Basophil #: 0.1 10*3/uL (ref 0.0–0.1)
EOS PCT: 0.1 %
Eosinophil #: 0 10*3/uL (ref 0.0–0.7)
HCT: 27.5 % — AB (ref 35.0–47.0)
HGB: 8.5 g/dL — ABNORMAL LOW (ref 12.0–16.0)
LYMPHS ABS: 0.2 10*3/uL — AB (ref 1.0–3.6)
Lymphocyte %: 2.6 %
MCH: 28.8 pg (ref 26.0–34.0)
MCHC: 31 g/dL — ABNORMAL LOW (ref 32.0–36.0)
MCV: 93 fL (ref 80–100)
Monocyte #: 0.5 x10 3/mm (ref 0.2–0.9)
Monocyte %: 7.9 %
Neutrophil #: 5.8 10*3/uL (ref 1.4–6.5)
Neutrophil %: 88.6 %
PLATELETS: 439 10*3/uL (ref 150–440)
RBC: 2.96 10*6/uL — AB (ref 3.80–5.20)
RDW: 15 % — ABNORMAL HIGH (ref 11.5–14.5)
WBC: 6.5 10*3/uL (ref 3.6–11.0)

## 2014-04-10 LAB — BASIC METABOLIC PANEL
ANION GAP: 7 (ref 7–16)
BUN: 10 mg/dL (ref 7–18)
CO2: 21 mmol/L (ref 21–32)
CREATININE: 2.18 mg/dL — AB (ref 0.60–1.30)
Calcium, Total: 7.9 mg/dL — ABNORMAL LOW (ref 8.5–10.1)
Chloride: 109 mmol/L — ABNORMAL HIGH (ref 98–107)
EGFR (African American): 30 — ABNORMAL LOW
EGFR (Non-African Amer.): 26 — ABNORMAL LOW
Glucose: 96 mg/dL (ref 65–99)
Osmolality: 273 (ref 275–301)
Potassium: 4.6 mmol/L (ref 3.5–5.1)
Sodium: 137 mmol/L (ref 136–145)

## 2014-04-10 LAB — MAGNESIUM: MAGNESIUM: 1.6 mg/dL — AB

## 2014-04-10 LAB — PHOSPHORUS: Phosphorus: 4.9 mg/dL (ref 2.5–4.9)

## 2014-04-13 LAB — CBC WITH DIFFERENTIAL/PLATELET
BASOS ABS: 0.1 10*3/uL (ref 0.0–0.1)
Basophil %: 1 %
Eosinophil #: 0 10*3/uL (ref 0.0–0.7)
Eosinophil %: 0.1 %
HCT: 27.2 % — ABNORMAL LOW (ref 35.0–47.0)
HGB: 8.6 g/dL — ABNORMAL LOW (ref 12.0–16.0)
LYMPHS ABS: 0.1 10*3/uL — AB (ref 1.0–3.6)
Lymphocyte %: 2.2 %
MCH: 29.5 pg (ref 26.0–34.0)
MCHC: 31.8 g/dL — ABNORMAL LOW (ref 32.0–36.0)
MCV: 93 fL (ref 80–100)
MONO ABS: 0.4 x10 3/mm (ref 0.2–0.9)
Monocyte %: 8.3 %
NEUTROS PCT: 88.4 %
Neutrophil #: 4.4 10*3/uL (ref 1.4–6.5)
Platelet: 451 10*3/uL — ABNORMAL HIGH (ref 150–440)
RBC: 2.93 10*6/uL — AB (ref 3.80–5.20)
RDW: 14.7 % — ABNORMAL HIGH (ref 11.5–14.5)
WBC: 5 10*3/uL (ref 3.6–11.0)

## 2014-04-13 LAB — BASIC METABOLIC PANEL
Anion Gap: 11 (ref 7–16)
BUN: 14 mg/dL (ref 7–18)
Calcium, Total: 6.9 mg/dL — CL (ref 8.5–10.1)
Chloride: 109 mmol/L — ABNORMAL HIGH (ref 98–107)
Co2: 18 mmol/L — ABNORMAL LOW (ref 21–32)
Creatinine: 2.17 mg/dL — ABNORMAL HIGH (ref 0.60–1.30)
EGFR (African American): 30 — ABNORMAL LOW
EGFR (Non-African Amer.): 26 — ABNORMAL LOW
GLUCOSE: 93 mg/dL (ref 65–99)
Osmolality: 276 (ref 275–301)
Potassium: 4.9 mmol/L (ref 3.5–5.1)
Sodium: 138 mmol/L (ref 136–145)

## 2014-04-13 LAB — PHOSPHORUS: Phosphorus: 5.2 mg/dL — ABNORMAL HIGH (ref 2.5–4.9)

## 2014-04-13 LAB — MAGNESIUM: Magnesium: 1.8 mg/dL

## 2014-04-19 LAB — BASIC METABOLIC PANEL
Anion Gap: 10 (ref 7–16)
BUN: 16 mg/dL (ref 7–18)
CHLORIDE: 113 mmol/L — AB (ref 98–107)
Calcium, Total: 8.8 mg/dL (ref 8.5–10.1)
Co2: 17 mmol/L — ABNORMAL LOW (ref 21–32)
Creatinine: 2.02 mg/dL — ABNORMAL HIGH (ref 0.60–1.30)
GFR CALC AF AMER: 33 — AB
GFR CALC NON AF AMER: 29 — AB
GLUCOSE: 98 mg/dL (ref 65–99)
Osmolality: 281 (ref 275–301)
Potassium: 4.5 mmol/L (ref 3.5–5.1)
Sodium: 140 mmol/L (ref 136–145)

## 2014-04-19 LAB — CBC WITH DIFFERENTIAL/PLATELET
BASOS ABS: 0.1 10*3/uL (ref 0.0–0.1)
BASOS PCT: 1.1 %
EOS ABS: 0 10*3/uL (ref 0.0–0.7)
EOS PCT: 0.5 %
HCT: 28.5 % — AB (ref 35.0–47.0)
HGB: 8.7 g/dL — ABNORMAL LOW (ref 12.0–16.0)
Lymphocyte #: 0.2 10*3/uL — ABNORMAL LOW (ref 1.0–3.6)
Lymphocyte %: 3.8 %
MCH: 28.5 pg (ref 26.0–34.0)
MCHC: 30.4 g/dL — ABNORMAL LOW (ref 32.0–36.0)
MCV: 94 fL (ref 80–100)
MONO ABS: 0.4 x10 3/mm (ref 0.2–0.9)
Monocyte %: 8.7 %
NEUTROS PCT: 85.9 %
Neutrophil #: 4.2 10*3/uL (ref 1.4–6.5)
Platelet: 452 10*3/uL — ABNORMAL HIGH (ref 150–440)
RBC: 3.04 10*6/uL — ABNORMAL LOW (ref 3.80–5.20)
RDW: 15.2 % — AB (ref 11.5–14.5)
WBC: 4.9 10*3/uL (ref 3.6–11.0)

## 2014-04-19 LAB — MAGNESIUM: Magnesium: 1.9 mg/dL

## 2014-04-19 LAB — PHOSPHORUS: Phosphorus: 4.6 mg/dL (ref 2.5–4.9)

## 2014-04-23 LAB — CBC WITH DIFFERENTIAL/PLATELET
Basophil #: 0 10*3/uL (ref 0.0–0.1)
Basophil %: 0.9 %
EOS PCT: 0.8 %
Eosinophil #: 0 10*3/uL (ref 0.0–0.7)
HCT: 29.3 % — AB (ref 35.0–47.0)
HGB: 9 g/dL — ABNORMAL LOW (ref 12.0–16.0)
LYMPHS ABS: 0.2 10*3/uL — AB (ref 1.0–3.6)
LYMPHS PCT: 4.4 %
MCH: 28.5 pg (ref 26.0–34.0)
MCHC: 30.7 g/dL — AB (ref 32.0–36.0)
MCV: 93 fL (ref 80–100)
Monocyte #: 0.4 x10 3/mm (ref 0.2–0.9)
Monocyte %: 10.4 %
NEUTROS PCT: 83.5 %
Neutrophil #: 3.4 10*3/uL (ref 1.4–6.5)
PLATELETS: 379 10*3/uL (ref 150–440)
RBC: 3.15 10*6/uL — ABNORMAL LOW (ref 3.80–5.20)
RDW: 15.2 % — ABNORMAL HIGH (ref 11.5–14.5)
WBC: 4.1 10*3/uL (ref 3.6–11.0)

## 2014-04-23 LAB — BASIC METABOLIC PANEL
Anion Gap: 8 (ref 7–16)
BUN: 15 mg/dL (ref 7–18)
CHLORIDE: 111 mmol/L — AB (ref 98–107)
Calcium, Total: 9.4 mg/dL (ref 8.5–10.1)
Co2: 18 mmol/L — ABNORMAL LOW (ref 21–32)
Creatinine: 2.07 mg/dL — ABNORMAL HIGH (ref 0.60–1.30)
EGFR (African American): 32 — ABNORMAL LOW
GFR CALC NON AF AMER: 28 — AB
Glucose: 99 mg/dL (ref 65–99)
Osmolality: 275 (ref 275–301)
Potassium: 4.9 mmol/L (ref 3.5–5.1)
Sodium: 137 mmol/L (ref 136–145)

## 2014-04-23 LAB — PHOSPHORUS: Phosphorus: 4 mg/dL (ref 2.5–4.9)

## 2014-04-23 LAB — MAGNESIUM: MAGNESIUM: 2 mg/dL

## 2014-05-03 ENCOUNTER — Encounter: Payer: Self-pay | Admitting: Nephrology

## 2014-05-03 LAB — CBC WITH DIFFERENTIAL/PLATELET
BASOS ABS: 0 10*3/uL (ref 0.0–0.1)
Basophil %: 0.7 %
EOS PCT: 2.2 %
Eosinophil #: 0.1 10*3/uL (ref 0.0–0.7)
HCT: 30.5 % — AB (ref 35.0–47.0)
HGB: 9.6 g/dL — ABNORMAL LOW (ref 12.0–16.0)
Lymphocyte #: 0.3 10*3/uL — ABNORMAL LOW (ref 1.0–3.6)
Lymphocyte %: 5.7 %
MCH: 29.5 pg (ref 26.0–34.0)
MCHC: 31.5 g/dL — ABNORMAL LOW (ref 32.0–36.0)
MCV: 94 fL (ref 80–100)
Monocyte #: 0.6 x10 3/mm (ref 0.2–0.9)
Monocyte %: 10.1 %
Neutrophil #: 4.6 10*3/uL (ref 1.4–6.5)
Neutrophil %: 81.3 %
Platelet: 297 10*3/uL (ref 150–440)
RBC: 3.26 10*6/uL — AB (ref 3.80–5.20)
RDW: 16.3 % — ABNORMAL HIGH (ref 11.5–14.5)
WBC: 5.6 10*3/uL (ref 3.6–11.0)

## 2014-05-03 LAB — BASIC METABOLIC PANEL
ANION GAP: 8 (ref 7–16)
BUN: 11 mg/dL (ref 7–18)
CHLORIDE: 110 mmol/L — AB (ref 98–107)
CO2: 19 mmol/L — AB (ref 21–32)
Calcium, Total: 9.3 mg/dL (ref 8.5–10.1)
Creatinine: 1.78 mg/dL — ABNORMAL HIGH (ref 0.60–1.30)
EGFR (African American): 39 — ABNORMAL LOW
GFR CALC NON AF AMER: 32 — AB
GLUCOSE: 102 mg/dL — AB (ref 65–99)
OSMOLALITY: 273 (ref 275–301)
POTASSIUM: 5.2 mmol/L — AB (ref 3.5–5.1)
Sodium: 137 mmol/L (ref 136–145)

## 2014-05-03 LAB — MAGNESIUM: Magnesium: 1.5 mg/dL — ABNORMAL LOW

## 2014-05-03 LAB — PHOSPHORUS: Phosphorus: 4.1 mg/dL (ref 2.5–4.9)

## 2014-05-07 LAB — CBC WITH DIFFERENTIAL/PLATELET
BASOS ABS: 0 10*3/uL (ref 0.0–0.1)
Basophil %: 0.6 %
EOS ABS: 0.2 10*3/uL (ref 0.0–0.7)
EOS PCT: 2.7 %
HCT: 29.2 % — ABNORMAL LOW (ref 35.0–47.0)
HGB: 8.9 g/dL — ABNORMAL LOW (ref 12.0–16.0)
LYMPHS PCT: 6.3 %
Lymphocyte #: 0.4 10*3/uL — ABNORMAL LOW (ref 1.0–3.6)
MCH: 28.8 pg (ref 26.0–34.0)
MCHC: 30.4 g/dL — ABNORMAL LOW (ref 32.0–36.0)
MCV: 95 fL (ref 80–100)
Monocyte #: 0.6 x10 3/mm (ref 0.2–0.9)
Monocyte %: 10.5 %
Neutrophil #: 4.6 10*3/uL (ref 1.4–6.5)
Neutrophil %: 79.9 %
Platelet: 267 10*3/uL (ref 150–440)
RBC: 3.09 10*6/uL — ABNORMAL LOW (ref 3.80–5.20)
RDW: 16.2 % — ABNORMAL HIGH (ref 11.5–14.5)
WBC: 5.8 10*3/uL (ref 3.6–11.0)

## 2014-05-07 LAB — BASIC METABOLIC PANEL
ANION GAP: 5 — AB (ref 7–16)
BUN: 17 mg/dL (ref 7–18)
CALCIUM: 9 mg/dL (ref 8.5–10.1)
CHLORIDE: 108 mmol/L — AB (ref 98–107)
CO2: 20 mmol/L — AB (ref 21–32)
CREATININE: 1.99 mg/dL — AB (ref 0.60–1.30)
EGFR (African American): 35 — ABNORMAL LOW
EGFR (Non-African Amer.): 29 — ABNORMAL LOW
Glucose: 100 mg/dL — ABNORMAL HIGH (ref 65–99)
OSMOLALITY: 268 (ref 275–301)
Potassium: 5.3 mmol/L — ABNORMAL HIGH (ref 3.5–5.1)
Sodium: 133 mmol/L — ABNORMAL LOW (ref 136–145)

## 2014-05-07 LAB — MAGNESIUM: Magnesium: 1.4 mg/dL — ABNORMAL LOW

## 2014-05-07 LAB — PHOSPHORUS: Phosphorus: 4.3 mg/dL (ref 2.5–4.9)

## 2014-05-10 LAB — BASIC METABOLIC PANEL
Anion Gap: 7 (ref 7–16)
BUN: 13 mg/dL (ref 7–18)
CHLORIDE: 112 mmol/L — AB (ref 98–107)
CO2: 18 mmol/L — AB (ref 21–32)
Calcium, Total: 9.1 mg/dL (ref 8.5–10.1)
Creatinine: 1.78 mg/dL — ABNORMAL HIGH (ref 0.60–1.30)
EGFR (African American): 39 — ABNORMAL LOW
EGFR (Non-African Amer.): 32 — ABNORMAL LOW
Glucose: 103 mg/dL — ABNORMAL HIGH (ref 65–99)
Osmolality: 274 (ref 275–301)
POTASSIUM: 5.1 mmol/L (ref 3.5–5.1)
Sodium: 137 mmol/L (ref 136–145)

## 2014-05-10 LAB — CBC WITH DIFFERENTIAL/PLATELET
BASOS ABS: 0 10*3/uL (ref 0.0–0.1)
BASOS PCT: 0.6 %
EOS ABS: 0.1 10*3/uL (ref 0.0–0.7)
EOS PCT: 2.3 %
HCT: 31.1 % — ABNORMAL LOW (ref 35.0–47.0)
HGB: 9.8 g/dL — AB (ref 12.0–16.0)
LYMPHS ABS: 0.3 10*3/uL — AB (ref 1.0–3.6)
Lymphocyte %: 7.5 %
MCH: 30.2 pg (ref 26.0–34.0)
MCHC: 31.7 g/dL — ABNORMAL LOW (ref 32.0–36.0)
MCV: 95 fL (ref 80–100)
MONO ABS: 0.4 x10 3/mm (ref 0.2–0.9)
Monocyte %: 9.9 %
NEUTROS ABS: 3.5 10*3/uL (ref 1.4–6.5)
Neutrophil %: 79.7 %
PLATELETS: 302 10*3/uL (ref 150–440)
RBC: 3.26 10*6/uL — ABNORMAL LOW (ref 3.80–5.20)
RDW: 16.1 % — ABNORMAL HIGH (ref 11.5–14.5)
WBC: 4.5 10*3/uL (ref 3.6–11.0)

## 2014-05-10 LAB — PHOSPHORUS: Phosphorus: 4 mg/dL (ref 2.5–4.9)

## 2014-05-10 LAB — MAGNESIUM: Magnesium: 1.6 mg/dL — ABNORMAL LOW

## 2014-05-14 LAB — CBC WITH DIFFERENTIAL/PLATELET
BASOS PCT: 0.7 %
Basophil #: 0 10*3/uL (ref 0.0–0.1)
EOS ABS: 0.1 10*3/uL (ref 0.0–0.7)
EOS PCT: 2.1 %
HCT: 31.6 % — ABNORMAL LOW (ref 35.0–47.0)
HGB: 9.9 g/dL — AB (ref 12.0–16.0)
Lymphocyte #: 0.4 10*3/uL — ABNORMAL LOW (ref 1.0–3.6)
Lymphocyte %: 6.8 %
MCH: 29.9 pg (ref 26.0–34.0)
MCHC: 31.4 g/dL — ABNORMAL LOW (ref 32.0–36.0)
MCV: 95 fL (ref 80–100)
Monocyte #: 0.5 x10 3/mm (ref 0.2–0.9)
Monocyte %: 9 %
NEUTROS ABS: 4.3 10*3/uL (ref 1.4–6.5)
NEUTROS PCT: 81.4 %
PLATELETS: 301 10*3/uL (ref 150–440)
RBC: 3.33 10*6/uL — AB (ref 3.80–5.20)
RDW: 17.1 % — AB (ref 11.5–14.5)
WBC: 5.3 10*3/uL (ref 3.6–11.0)

## 2014-05-14 LAB — BASIC METABOLIC PANEL
ANION GAP: 10 (ref 7–16)
BUN: 14 mg/dL (ref 7–18)
CALCIUM: 9.2 mg/dL (ref 8.5–10.1)
CHLORIDE: 111 mmol/L — AB (ref 98–107)
CO2: 19 mmol/L — AB (ref 21–32)
CREATININE: 1.65 mg/dL — AB (ref 0.60–1.30)
EGFR (Non-African Amer.): 35 — ABNORMAL LOW
GFR CALC AF AMER: 43 — AB
GLUCOSE: 98 mg/dL (ref 65–99)
Osmolality: 280 (ref 275–301)
Potassium: 5 mmol/L (ref 3.5–5.1)
Sodium: 140 mmol/L (ref 136–145)

## 2014-05-14 LAB — MAGNESIUM: MAGNESIUM: 1.7 mg/dL — AB

## 2014-05-14 LAB — PHOSPHORUS: Phosphorus: 3.3 mg/dL (ref 2.5–4.9)

## 2014-05-17 LAB — COMPREHENSIVE METABOLIC PANEL
ALBUMIN: 3.9 g/dL (ref 3.4–5.0)
ANION GAP: 7 (ref 7–16)
AST: 13 U/L — AB (ref 15–37)
Alkaline Phosphatase: 208 U/L — ABNORMAL HIGH
BILIRUBIN TOTAL: 0.2 mg/dL (ref 0.2–1.0)
BUN: 18 mg/dL (ref 7–18)
CALCIUM: 9.5 mg/dL (ref 8.5–10.1)
CHLORIDE: 111 mmol/L — AB (ref 98–107)
Co2: 19 mmol/L — ABNORMAL LOW (ref 21–32)
Creatinine: 1.83 mg/dL — ABNORMAL HIGH (ref 0.60–1.30)
GLUCOSE: 103 mg/dL — AB (ref 65–99)
OSMOLALITY: 276 (ref 275–301)
POTASSIUM: 5.2 mmol/L — AB (ref 3.5–5.1)
SGPT (ALT): 24 U/L
SODIUM: 137 mmol/L (ref 136–145)
TOTAL PROTEIN: 7.8 g/dL (ref 6.4–8.2)

## 2014-05-17 LAB — CBC WITH DIFFERENTIAL/PLATELET
Basophil #: 0 10*3/uL (ref 0.0–0.1)
Basophil %: 0.5 %
Eosinophil #: 0.2 10*3/uL (ref 0.0–0.7)
Eosinophil %: 2.4 %
HCT: 32.2 % — ABNORMAL LOW (ref 35.0–47.0)
HGB: 9.8 g/dL — ABNORMAL LOW (ref 12.0–16.0)
Lymphocyte #: 0.4 10*3/uL — ABNORMAL LOW (ref 1.0–3.6)
Lymphocyte %: 7 %
MCH: 28.9 pg (ref 26.0–34.0)
MCHC: 30.5 g/dL — AB (ref 32.0–36.0)
MCV: 95 fL (ref 80–100)
Monocyte #: 0.5 x10 3/mm (ref 0.2–0.9)
Monocyte %: 8 %
Neutrophil #: 5.3 10*3/uL (ref 1.4–6.5)
Neutrophil %: 82.1 %
Platelet: 311 10*3/uL (ref 150–440)
RBC: 3.39 10*6/uL — ABNORMAL LOW (ref 3.80–5.20)
RDW: 16.9 % — ABNORMAL HIGH (ref 11.5–14.5)
WBC: 6.4 10*3/uL (ref 3.6–11.0)

## 2014-05-17 LAB — PHOSPHORUS: Phosphorus: 4.2 mg/dL (ref 2.5–4.9)

## 2014-05-17 LAB — MAGNESIUM: MAGNESIUM: 1.5 mg/dL — AB

## 2014-05-21 LAB — CBC WITH DIFFERENTIAL/PLATELET
Basophil #: 0.1 10*3/uL (ref 0.0–0.1)
Basophil %: 0.9 %
Eosinophil #: 0.1 10*3/uL (ref 0.0–0.7)
Eosinophil %: 1.9 %
HCT: 31.9 % — ABNORMAL LOW (ref 35.0–47.0)
HGB: 9.8 g/dL — ABNORMAL LOW (ref 12.0–16.0)
Lymphocyte #: 0.5 10*3/uL — ABNORMAL LOW (ref 1.0–3.6)
Lymphocyte %: 8.8 %
MCH: 29.2 pg (ref 26.0–34.0)
MCHC: 30.8 g/dL — ABNORMAL LOW (ref 32.0–36.0)
MCV: 95 fL (ref 80–100)
Monocyte #: 0.5 x10 3/mm (ref 0.2–0.9)
Monocyte %: 9.2 %
Neutrophil #: 4.7 10*3/uL (ref 1.4–6.5)
Neutrophil %: 79.2 %
Platelet: 328 10*3/uL (ref 150–440)
RBC: 3.36 10*6/uL — ABNORMAL LOW (ref 3.80–5.20)
RDW: 16.7 % — ABNORMAL HIGH (ref 11.5–14.5)
WBC: 6 10*3/uL (ref 3.6–11.0)

## 2014-05-21 LAB — PHOSPHORUS: Phosphorus: 3.5 mg/dL (ref 2.5–4.9)

## 2014-05-21 LAB — COMPREHENSIVE METABOLIC PANEL
Albumin: 3.7 g/dL (ref 3.4–5.0)
Alkaline Phosphatase: 204 U/L — ABNORMAL HIGH
Anion Gap: 7 (ref 7–16)
BUN: 14 mg/dL (ref 7–18)
Bilirubin,Total: 0.2 mg/dL (ref 0.2–1.0)
Calcium, Total: 9 mg/dL (ref 8.5–10.1)
Chloride: 111 mmol/L — ABNORMAL HIGH (ref 98–107)
Co2: 19 mmol/L — ABNORMAL LOW (ref 21–32)
Creatinine: 1.97 mg/dL — ABNORMAL HIGH (ref 0.60–1.30)
EGFR (African American): 35 — ABNORMAL LOW
EGFR (Non-African Amer.): 29 — ABNORMAL LOW
Glucose: 104 mg/dL — ABNORMAL HIGH (ref 65–99)
Osmolality: 275 (ref 275–301)
Potassium: 4.8 mmol/L (ref 3.5–5.1)
SGOT(AST): 10 U/L — ABNORMAL LOW (ref 15–37)
SGPT (ALT): 18 U/L
Sodium: 137 mmol/L (ref 136–145)
Total Protein: 8.1 g/dL (ref 6.4–8.2)

## 2014-05-21 LAB — MAGNESIUM: Magnesium: 1.8 mg/dL

## 2014-05-28 LAB — CBC WITH DIFFERENTIAL/PLATELET
BASOS ABS: 0 10*3/uL (ref 0.0–0.1)
Basophil %: 0.8 %
EOS PCT: 2.3 %
Eosinophil #: 0.1 10*3/uL (ref 0.0–0.7)
HCT: 32.8 % — ABNORMAL LOW (ref 35.0–47.0)
HGB: 10.3 g/dL — ABNORMAL LOW (ref 12.0–16.0)
Lymphocyte #: 0.5 10*3/uL — ABNORMAL LOW (ref 1.0–3.6)
Lymphocyte %: 8.5 %
MCH: 29.9 pg (ref 26.0–34.0)
MCHC: 31.5 g/dL — ABNORMAL LOW (ref 32.0–36.0)
MCV: 95 fL (ref 80–100)
MONO ABS: 0.6 x10 3/mm (ref 0.2–0.9)
MONOS PCT: 10.2 %
NEUTROS ABS: 4.4 10*3/uL (ref 1.4–6.5)
NEUTROS PCT: 78.2 %
Platelet: 314 10*3/uL (ref 150–440)
RBC: 3.46 10*6/uL — AB (ref 3.80–5.20)
RDW: 17.1 % — ABNORMAL HIGH (ref 11.5–14.5)
WBC: 5.6 10*3/uL (ref 3.6–11.0)

## 2014-05-28 LAB — BASIC METABOLIC PANEL
ANION GAP: 7 (ref 7–16)
BUN: 21 mg/dL — AB (ref 7–18)
CO2: 20 mmol/L — AB (ref 21–32)
Calcium, Total: 9.2 mg/dL (ref 8.5–10.1)
Chloride: 112 mmol/L — ABNORMAL HIGH (ref 98–107)
Creatinine: 1.65 mg/dL — ABNORMAL HIGH (ref 0.60–1.30)
EGFR (African American): 43 — ABNORMAL LOW
EGFR (Non-African Amer.): 35 — ABNORMAL LOW
GLUCOSE: 103 mg/dL — AB (ref 65–99)
Osmolality: 281 (ref 275–301)
Potassium: 5.1 mmol/L (ref 3.5–5.1)
Sodium: 139 mmol/L (ref 136–145)

## 2014-05-28 LAB — MAGNESIUM: Magnesium: 1.6 mg/dL — ABNORMAL LOW

## 2014-05-28 LAB — PHOSPHORUS: Phosphorus: 4 mg/dL (ref 2.5–4.9)

## 2014-06-03 ENCOUNTER — Encounter: Payer: Self-pay | Admitting: Nephrology

## 2014-06-04 LAB — CBC WITH DIFFERENTIAL/PLATELET
BASOS PCT: 0.5 %
Basophil #: 0 10*3/uL (ref 0.0–0.1)
EOS ABS: 0.1 10*3/uL (ref 0.0–0.7)
EOS PCT: 1.8 %
HCT: 35.2 % (ref 35.0–47.0)
HGB: 10.9 g/dL — ABNORMAL LOW (ref 12.0–16.0)
Lymphocyte #: 0.7 10*3/uL — ABNORMAL LOW (ref 1.0–3.6)
Lymphocyte %: 11.2 %
MCH: 29.4 pg (ref 26.0–34.0)
MCHC: 31 g/dL — AB (ref 32.0–36.0)
MCV: 95 fL (ref 80–100)
MONO ABS: 0.6 x10 3/mm (ref 0.2–0.9)
Monocyte %: 9.5 %
Neutrophil #: 5 10*3/uL (ref 1.4–6.5)
Neutrophil %: 77 %
Platelet: 307 10*3/uL (ref 150–440)
RBC: 3.7 10*6/uL — ABNORMAL LOW (ref 3.80–5.20)
RDW: 16.9 % — ABNORMAL HIGH (ref 11.5–14.5)
WBC: 6.5 10*3/uL (ref 3.6–11.0)

## 2014-06-04 LAB — COMPREHENSIVE METABOLIC PANEL
ALK PHOS: 190 U/L — AB
ANION GAP: 8 (ref 7–16)
Albumin: 3.9 g/dL (ref 3.4–5.0)
BILIRUBIN TOTAL: 0.3 mg/dL (ref 0.2–1.0)
BUN: 10 mg/dL (ref 7–18)
CHLORIDE: 109 mmol/L — AB (ref 98–107)
CO2: 22 mmol/L (ref 21–32)
Calcium, Total: 9.3 mg/dL (ref 8.5–10.1)
Creatinine: 1.66 mg/dL — ABNORMAL HIGH (ref 0.60–1.30)
EGFR (Non-African Amer.): 35 — ABNORMAL LOW
GFR CALC AF AMER: 43 — AB
Glucose: 99 mg/dL (ref 65–99)
Osmolality: 277 (ref 275–301)
Potassium: 5.2 mmol/L — ABNORMAL HIGH (ref 3.5–5.1)
SGOT(AST): 16 U/L (ref 15–37)
SGPT (ALT): 19 U/L
Sodium: 139 mmol/L (ref 136–145)
Total Protein: 7.9 g/dL (ref 6.4–8.2)

## 2014-06-04 LAB — LIPID PANEL
CHOLESTEROL: 178 mg/dL (ref 0–200)
HDL Cholesterol: 26 mg/dL — ABNORMAL LOW (ref 40–60)
LDL CHOLESTEROL, CALC: 105 mg/dL — AB (ref 0–100)
Triglycerides: 234 mg/dL — ABNORMAL HIGH (ref 0–200)
VLDL CHOLESTEROL, CALC: 47 mg/dL — AB (ref 5–40)

## 2014-06-04 LAB — MAGNESIUM: MAGNESIUM: 1.8 mg/dL

## 2014-06-04 LAB — GAMMA GT: GGT: 34 U/L (ref 5–85)

## 2014-06-04 LAB — PHOSPHORUS: PHOSPHORUS: 3.2 mg/dL (ref 2.5–4.9)

## 2014-06-11 LAB — BASIC METABOLIC PANEL
Anion Gap: 8 (ref 7–16)
BUN: 21 mg/dL — AB (ref 7–18)
CO2: 19 mmol/L — AB (ref 21–32)
CREATININE: 1.86 mg/dL — AB (ref 0.60–1.30)
Calcium, Total: 9 mg/dL (ref 8.5–10.1)
Chloride: 109 mmol/L — ABNORMAL HIGH (ref 98–107)
EGFR (Non-African Amer.): 31 — ABNORMAL LOW
GFR CALC AF AMER: 37 — AB
Glucose: 101 mg/dL — ABNORMAL HIGH (ref 65–99)
OSMOLALITY: 275 (ref 275–301)
POTASSIUM: 5.4 mmol/L — AB (ref 3.5–5.1)
Sodium: 136 mmol/L (ref 136–145)

## 2014-06-11 LAB — CBC WITH DIFFERENTIAL/PLATELET
BASOS ABS: 0.1 10*3/uL (ref 0.0–0.1)
Basophil %: 0.8 %
EOS PCT: 1.7 %
Eosinophil #: 0.1 10*3/uL (ref 0.0–0.7)
HCT: 34.2 % — ABNORMAL LOW (ref 35.0–47.0)
HGB: 10.8 g/dL — AB (ref 12.0–16.0)
LYMPHS PCT: 11.7 %
Lymphocyte #: 0.9 10*3/uL — ABNORMAL LOW (ref 1.0–3.6)
MCH: 29.8 pg (ref 26.0–34.0)
MCHC: 31.5 g/dL — ABNORMAL LOW (ref 32.0–36.0)
MCV: 94 fL (ref 80–100)
Monocyte #: 0.8 x10 3/mm (ref 0.2–0.9)
Monocyte %: 9.9 %
Neutrophil #: 6 10*3/uL (ref 1.4–6.5)
Neutrophil %: 75.9 %
Platelet: 290 10*3/uL (ref 150–440)
RBC: 3.62 10*6/uL — ABNORMAL LOW (ref 3.80–5.20)
RDW: 17.2 % — AB (ref 11.5–14.5)
WBC: 7.9 10*3/uL (ref 3.6–11.0)

## 2014-06-11 LAB — MAGNESIUM: Magnesium: 1.6 mg/dL — ABNORMAL LOW

## 2014-06-11 LAB — PHOSPHORUS: Phosphorus: 4 mg/dL (ref 2.5–4.9)

## 2014-06-15 LAB — BASIC METABOLIC PANEL
Anion Gap: 8 (ref 7–16)
BUN: 21 mg/dL — AB (ref 7–18)
CALCIUM: 8.6 mg/dL (ref 8.5–10.1)
CREATININE: 1.96 mg/dL — AB (ref 0.60–1.30)
Chloride: 109 mmol/L — ABNORMAL HIGH (ref 98–107)
Co2: 19 mmol/L — ABNORMAL LOW (ref 21–32)
EGFR (African American): 35 — ABNORMAL LOW
EGFR (Non-African Amer.): 29 — ABNORMAL LOW
Glucose: 89 mg/dL (ref 65–99)
OSMOLALITY: 274 (ref 275–301)
POTASSIUM: 4.9 mmol/L (ref 3.5–5.1)
Sodium: 136 mmol/L (ref 136–145)

## 2014-06-15 LAB — CBC WITH DIFFERENTIAL/PLATELET
BASOS PCT: 0.6 %
Basophil #: 0 10*3/uL (ref 0.0–0.1)
EOS ABS: 0.1 10*3/uL (ref 0.0–0.7)
Eosinophil %: 1.6 %
HCT: 33.2 % — ABNORMAL LOW (ref 35.0–47.0)
HGB: 10.4 g/dL — AB (ref 12.0–16.0)
Lymphocyte #: 0.8 10*3/uL — ABNORMAL LOW (ref 1.0–3.6)
Lymphocyte %: 11.6 %
MCH: 29.8 pg (ref 26.0–34.0)
MCHC: 31.4 g/dL — ABNORMAL LOW (ref 32.0–36.0)
MCV: 95 fL (ref 80–100)
MONO ABS: 0.7 x10 3/mm (ref 0.2–0.9)
MONOS PCT: 9.9 %
NEUTROS PCT: 76.3 %
Neutrophil #: 5.3 10*3/uL (ref 1.4–6.5)
Platelet: 274 10*3/uL (ref 150–440)
RBC: 3.5 10*6/uL — ABNORMAL LOW (ref 3.80–5.20)
RDW: 16.5 % — ABNORMAL HIGH (ref 11.5–14.5)
WBC: 6.9 10*3/uL (ref 3.6–11.0)

## 2014-06-15 LAB — PHOSPHORUS: Phosphorus: 3.6 mg/dL (ref 2.5–4.9)

## 2014-06-15 LAB — MAGNESIUM: Magnesium: 1.7 mg/dL — ABNORMAL LOW

## 2014-06-18 LAB — CBC WITH DIFFERENTIAL/PLATELET
BASOS ABS: 0 10*3/uL (ref 0.0–0.1)
BASOS PCT: 0.4 %
EOS PCT: 1.7 %
Eosinophil #: 0.1 10*3/uL (ref 0.0–0.7)
HCT: 35.4 % (ref 35.0–47.0)
HGB: 11.1 g/dL — ABNORMAL LOW (ref 12.0–16.0)
LYMPHS ABS: 0.9 10*3/uL — AB (ref 1.0–3.6)
Lymphocyte %: 13.8 %
MCH: 29.7 pg (ref 26.0–34.0)
MCHC: 31.2 g/dL — AB (ref 32.0–36.0)
MCV: 95 fL (ref 80–100)
MONO ABS: 0.5 x10 3/mm (ref 0.2–0.9)
MONOS PCT: 7.9 %
NEUTROS ABS: 4.9 10*3/uL (ref 1.4–6.5)
Neutrophil %: 76.2 %
PLATELETS: 297 10*3/uL (ref 150–440)
RBC: 3.72 10*6/uL — ABNORMAL LOW (ref 3.80–5.20)
RDW: 17.3 % — ABNORMAL HIGH (ref 11.5–14.5)
WBC: 6.4 10*3/uL (ref 3.6–11.0)

## 2014-06-18 LAB — BASIC METABOLIC PANEL
ANION GAP: 4 — AB (ref 7–16)
BUN: 16 mg/dL (ref 7–18)
CREATININE: 1.72 mg/dL — AB (ref 0.60–1.30)
Calcium, Total: 9.1 mg/dL (ref 8.5–10.1)
Chloride: 111 mmol/L — ABNORMAL HIGH (ref 98–107)
Co2: 21 mmol/L (ref 21–32)
EGFR (African American): 41 — ABNORMAL LOW
GFR CALC NON AF AMER: 34 — AB
Glucose: 100 mg/dL — ABNORMAL HIGH (ref 65–99)
OSMOLALITY: 273 (ref 275–301)
Potassium: 5.3 mmol/L — ABNORMAL HIGH (ref 3.5–5.1)
Sodium: 136 mmol/L (ref 136–145)

## 2014-06-18 LAB — MAGNESIUM: MAGNESIUM: 1.5 mg/dL — AB

## 2014-06-18 LAB — PHOSPHORUS: Phosphorus: 3.5 mg/dL (ref 2.5–4.9)

## 2014-06-25 LAB — CBC WITH DIFFERENTIAL/PLATELET
BASOS ABS: 0 10*3/uL (ref 0.0–0.1)
BASOS PCT: 0.5 %
EOS PCT: 2.3 %
Eosinophil #: 0.1 10*3/uL (ref 0.0–0.7)
HCT: 35 % (ref 35.0–47.0)
HGB: 10.8 g/dL — ABNORMAL LOW (ref 12.0–16.0)
Lymphocyte #: 0.8 10*3/uL — ABNORMAL LOW (ref 1.0–3.6)
Lymphocyte %: 13 %
MCH: 29.4 pg (ref 26.0–34.0)
MCHC: 30.9 g/dL — AB (ref 32.0–36.0)
MCV: 95 fL (ref 80–100)
MONO ABS: 0.6 x10 3/mm (ref 0.2–0.9)
MONOS PCT: 10.4 %
Neutrophil #: 4.3 10*3/uL (ref 1.4–6.5)
Neutrophil %: 73.8 %
PLATELETS: 298 10*3/uL (ref 150–440)
RBC: 3.68 10*6/uL — ABNORMAL LOW (ref 3.80–5.20)
RDW: 17.2 % — ABNORMAL HIGH (ref 11.5–14.5)
WBC: 5.9 10*3/uL (ref 3.6–11.0)

## 2014-06-25 LAB — BASIC METABOLIC PANEL
Anion Gap: 6 — ABNORMAL LOW (ref 7–16)
BUN: 23 mg/dL — ABNORMAL HIGH (ref 7–18)
CALCIUM: 8.8 mg/dL (ref 8.5–10.1)
Chloride: 113 mmol/L — ABNORMAL HIGH (ref 98–107)
Co2: 20 mmol/L — ABNORMAL LOW (ref 21–32)
Creatinine: 1.83 mg/dL — ABNORMAL HIGH (ref 0.60–1.30)
EGFR (African American): 38 — ABNORMAL LOW
EGFR (Non-African Amer.): 31 — ABNORMAL LOW
GLUCOSE: 92 mg/dL (ref 65–99)
Osmolality: 281 (ref 275–301)
Potassium: 5.1 mmol/L (ref 3.5–5.1)
SODIUM: 139 mmol/L (ref 136–145)

## 2014-06-25 LAB — PHOSPHORUS: PHOSPHORUS: 4 mg/dL (ref 2.5–4.9)

## 2014-06-25 LAB — MAGNESIUM: MAGNESIUM: 1.9 mg/dL

## 2014-07-02 LAB — CBC WITH DIFFERENTIAL/PLATELET
Basophil #: 0 10*3/uL (ref 0.0–0.1)
Basophil %: 0.6 %
EOS PCT: 2 %
Eosinophil #: 0.1 10*3/uL (ref 0.0–0.7)
HCT: 35.7 % (ref 35.0–47.0)
HGB: 11 g/dL — ABNORMAL LOW (ref 12.0–16.0)
Lymphocyte #: 0.7 10*3/uL — ABNORMAL LOW (ref 1.0–3.6)
Lymphocyte %: 11.2 %
MCH: 29.6 pg (ref 26.0–34.0)
MCHC: 30.9 g/dL — ABNORMAL LOW (ref 32.0–36.0)
MCV: 96 fL (ref 80–100)
MONO ABS: 0.6 x10 3/mm (ref 0.2–0.9)
Monocyte %: 9.6 %
Neutrophil #: 4.9 10*3/uL (ref 1.4–6.5)
Neutrophil %: 76.6 %
Platelet: 291 10*3/uL (ref 150–440)
RBC: 3.73 10*6/uL — ABNORMAL LOW (ref 3.80–5.20)
RDW: 16.8 % — ABNORMAL HIGH (ref 11.5–14.5)
WBC: 6.4 10*3/uL (ref 3.6–11.0)

## 2014-07-02 LAB — BASIC METABOLIC PANEL
ANION GAP: 8 (ref 7–16)
BUN: 23 mg/dL — AB (ref 7–18)
CREATININE: 1.86 mg/dL — AB (ref 0.60–1.30)
Calcium, Total: 8.9 mg/dL (ref 8.5–10.1)
Chloride: 108 mmol/L — ABNORMAL HIGH (ref 98–107)
Co2: 19 mmol/L — ABNORMAL LOW (ref 21–32)
EGFR (African American): 37 — ABNORMAL LOW
EGFR (Non-African Amer.): 31 — ABNORMAL LOW
GLUCOSE: 95 mg/dL (ref 65–99)
OSMOLALITY: 274 (ref 275–301)
POTASSIUM: 5 mmol/L (ref 3.5–5.1)
Sodium: 135 mmol/L — ABNORMAL LOW (ref 136–145)

## 2014-07-02 LAB — PHOSPHORUS: PHOSPHORUS: 3.7 mg/dL (ref 2.5–4.9)

## 2014-07-02 LAB — MAGNESIUM: Magnesium: 1.7 mg/dL — ABNORMAL LOW

## 2014-07-03 ENCOUNTER — Encounter: Payer: Self-pay | Admitting: Nephrology

## 2014-07-09 LAB — CBC WITH DIFFERENTIAL/PLATELET
BASOS ABS: 0 10*3/uL (ref 0.0–0.1)
BASOS PCT: 0.5 %
Eosinophil #: 0.2 10*3/uL (ref 0.0–0.7)
Eosinophil %: 4 %
HCT: 37.8 % (ref 35.0–47.0)
HGB: 11.6 g/dL — AB (ref 12.0–16.0)
Lymphocyte #: 0.7 10*3/uL — ABNORMAL LOW (ref 1.0–3.6)
Lymphocyte %: 12.2 %
MCH: 29.7 pg (ref 26.0–34.0)
MCHC: 30.8 g/dL — AB (ref 32.0–36.0)
MCV: 97 fL (ref 80–100)
MONO ABS: 0.5 x10 3/mm (ref 0.2–0.9)
Monocyte %: 9.8 %
Neutrophil #: 4 10*3/uL (ref 1.4–6.5)
Neutrophil %: 73.5 %
Platelet: 321 10*3/uL (ref 150–440)
RBC: 3.91 10*6/uL (ref 3.80–5.20)
RDW: 16.5 % — ABNORMAL HIGH (ref 11.5–14.5)
WBC: 5.4 10*3/uL (ref 3.6–11.0)

## 2014-07-09 LAB — BASIC METABOLIC PANEL
Anion Gap: 10 (ref 7–16)
BUN: 21 mg/dL — ABNORMAL HIGH (ref 7–18)
CALCIUM: 8.4 mg/dL — AB (ref 8.5–10.1)
CHLORIDE: 107 mmol/L (ref 98–107)
CREATININE: 1.9 mg/dL — AB (ref 0.60–1.30)
Co2: 20 mmol/L — ABNORMAL LOW (ref 21–32)
EGFR (African American): 37 — ABNORMAL LOW
GFR CALC NON AF AMER: 30 — AB
Glucose: 101 mg/dL — ABNORMAL HIGH (ref 65–99)
OSMOLALITY: 277 (ref 275–301)
Potassium: 5.4 mmol/L — ABNORMAL HIGH (ref 3.5–5.1)
Sodium: 137 mmol/L (ref 136–145)

## 2014-07-09 LAB — MAGNESIUM: Magnesium: 1.6 mg/dL — ABNORMAL LOW

## 2014-07-09 LAB — PHOSPHORUS: PHOSPHORUS: 3.5 mg/dL (ref 2.5–4.9)

## 2014-07-16 LAB — BASIC METABOLIC PANEL
Anion Gap: 8 (ref 7–16)
BUN: 26 mg/dL — ABNORMAL HIGH (ref 7–18)
CALCIUM: 9 mg/dL (ref 8.5–10.1)
CO2: 19 mmol/L — AB (ref 21–32)
Chloride: 110 mmol/L — ABNORMAL HIGH (ref 98–107)
Creatinine: 1.84 mg/dL — ABNORMAL HIGH (ref 0.60–1.30)
EGFR (African American): 38 — ABNORMAL LOW
EGFR (Non-African Amer.): 31 — ABNORMAL LOW
GLUCOSE: 109 mg/dL — AB (ref 65–99)
Osmolality: 279 (ref 275–301)
Potassium: 5.1 mmol/L (ref 3.5–5.1)
SODIUM: 137 mmol/L (ref 136–145)

## 2014-07-16 LAB — CBC WITH DIFFERENTIAL/PLATELET
BASOS PCT: 1.2 %
Basophil #: 0.1 10*3/uL (ref 0.0–0.1)
Eosinophil #: 0.1 10*3/uL (ref 0.0–0.7)
Eosinophil %: 2 %
HCT: 35.3 % (ref 35.0–47.0)
HGB: 11 g/dL — ABNORMAL LOW (ref 12.0–16.0)
LYMPHS ABS: 0.7 10*3/uL — AB (ref 1.0–3.6)
LYMPHS PCT: 12.7 %
MCH: 29.9 pg (ref 26.0–34.0)
MCHC: 31.3 g/dL — ABNORMAL LOW (ref 32.0–36.0)
MCV: 96 fL (ref 80–100)
MONOS PCT: 7.8 %
Monocyte #: 0.4 x10 3/mm (ref 0.2–0.9)
NEUTROS ABS: 4.3 10*3/uL (ref 1.4–6.5)
Neutrophil %: 76.3 %
Platelet: 283 10*3/uL (ref 150–440)
RBC: 3.69 10*6/uL — AB (ref 3.80–5.20)
RDW: 16 % — ABNORMAL HIGH (ref 11.5–14.5)
WBC: 5.7 10*3/uL (ref 3.6–11.0)

## 2014-07-16 LAB — MAGNESIUM: MAGNESIUM: 1.8 mg/dL

## 2014-07-16 LAB — PHOSPHORUS: Phosphorus: 3.9 mg/dL (ref 2.5–4.9)

## 2014-07-23 LAB — CBC WITH DIFFERENTIAL/PLATELET
Basophil #: 0 10*3/uL (ref 0.0–0.1)
Basophil %: 0.5 %
EOS ABS: 0.1 10*3/uL (ref 0.0–0.7)
Eosinophil %: 2.3 %
HCT: 35 % (ref 35.0–47.0)
HGB: 11.1 g/dL — AB (ref 12.0–16.0)
LYMPHS PCT: 13.2 %
Lymphocyte #: 0.8 10*3/uL — ABNORMAL LOW (ref 1.0–3.6)
MCH: 29.8 pg (ref 26.0–34.0)
MCHC: 31.6 g/dL — ABNORMAL LOW (ref 32.0–36.0)
MCV: 94 fL (ref 80–100)
Monocyte #: 0.4 x10 3/mm (ref 0.2–0.9)
Monocyte %: 6.5 %
Neutrophil #: 4.8 10*3/uL (ref 1.4–6.5)
Neutrophil %: 77.5 %
Platelet: 260 10*3/uL (ref 150–440)
RBC: 3.7 10*6/uL — ABNORMAL LOW (ref 3.80–5.20)
RDW: 15.6 % — ABNORMAL HIGH (ref 11.5–14.5)
WBC: 6.3 10*3/uL (ref 3.6–11.0)

## 2014-07-23 LAB — BASIC METABOLIC PANEL
Anion Gap: 5 — ABNORMAL LOW (ref 7–16)
BUN: 19 mg/dL — ABNORMAL HIGH (ref 7–18)
CALCIUM: 8.6 mg/dL (ref 8.5–10.1)
CREATININE: 1.75 mg/dL — AB (ref 0.60–1.30)
Chloride: 109 mmol/L — ABNORMAL HIGH (ref 98–107)
Co2: 21 mmol/L (ref 21–32)
EGFR (African American): 40 — ABNORMAL LOW
GFR CALC NON AF AMER: 33 — AB
GLUCOSE: 119 mg/dL — AB (ref 65–99)
Osmolality: 273 (ref 275–301)
Potassium: 5 mmol/L (ref 3.5–5.1)
Sodium: 135 mmol/L — ABNORMAL LOW (ref 136–145)

## 2014-07-23 LAB — MAGNESIUM: Magnesium: 1.7 mg/dL — ABNORMAL LOW

## 2014-07-23 LAB — PHOSPHORUS: PHOSPHORUS: 3.4 mg/dL (ref 2.5–4.9)

## 2014-07-30 LAB — CBC WITH DIFFERENTIAL/PLATELET
BASOS PCT: 0.3 %
Basophil #: 0 10*3/uL (ref 0.0–0.1)
EOS ABS: 0.1 10*3/uL (ref 0.0–0.7)
Eosinophil %: 3 %
HCT: 35.9 % (ref 35.0–47.0)
HGB: 11.1 g/dL — ABNORMAL LOW (ref 12.0–16.0)
LYMPHS ABS: 0.6 10*3/uL — AB (ref 1.0–3.6)
Lymphocyte %: 13.3 %
MCH: 29.1 pg (ref 26.0–34.0)
MCHC: 30.8 g/dL — ABNORMAL LOW (ref 32.0–36.0)
MCV: 94 fL (ref 80–100)
Monocyte #: 0.3 x10 3/mm (ref 0.2–0.9)
Monocyte %: 5.7 %
NEUTROS ABS: 3.5 10*3/uL (ref 1.4–6.5)
Neutrophil %: 77.7 %
Platelet: 247 10*3/uL (ref 150–440)
RBC: 3.81 10*6/uL (ref 3.80–5.20)
RDW: 15.9 % — AB (ref 11.5–14.5)
WBC: 4.5 10*3/uL (ref 3.6–11.0)

## 2014-07-30 LAB — BASIC METABOLIC PANEL
Anion Gap: 6 — ABNORMAL LOW (ref 7–16)
BUN: 21 mg/dL — ABNORMAL HIGH (ref 7–18)
Calcium, Total: 9 mg/dL (ref 8.5–10.1)
Chloride: 110 mmol/L — ABNORMAL HIGH (ref 98–107)
Co2: 21 mmol/L (ref 21–32)
Creatinine: 1.67 mg/dL — ABNORMAL HIGH (ref 0.60–1.30)
EGFR (Non-African Amer.): 35 — ABNORMAL LOW
GFR CALC AF AMER: 42 — AB
Glucose: 101 mg/dL — ABNORMAL HIGH (ref 65–99)
OSMOLALITY: 277 (ref 275–301)
Potassium: 5.1 mmol/L (ref 3.5–5.1)
SODIUM: 137 mmol/L (ref 136–145)

## 2014-07-30 LAB — PHOSPHORUS: PHOSPHORUS: 3.5 mg/dL (ref 2.5–4.9)

## 2014-07-30 LAB — MAGNESIUM: Magnesium: 1.6 mg/dL — ABNORMAL LOW

## 2014-08-03 ENCOUNTER — Encounter: Payer: Self-pay | Admitting: Nephrology

## 2014-08-06 LAB — BASIC METABOLIC PANEL
Anion Gap: 9 (ref 7–16)
BUN: 23 mg/dL — ABNORMAL HIGH (ref 7–18)
CALCIUM: 9.1 mg/dL (ref 8.5–10.1)
CREATININE: 1.87 mg/dL — AB (ref 0.60–1.30)
Chloride: 110 mmol/L — ABNORMAL HIGH (ref 98–107)
Co2: 20 mmol/L — ABNORMAL LOW (ref 21–32)
EGFR (African American): 37 — ABNORMAL LOW
EGFR (Non-African Amer.): 31 — ABNORMAL LOW
GLUCOSE: 90 mg/dL (ref 65–99)
Osmolality: 281 (ref 275–301)
Potassium: 4.7 mmol/L (ref 3.5–5.1)
SODIUM: 139 mmol/L (ref 136–145)

## 2014-08-06 LAB — CBC WITH DIFFERENTIAL/PLATELET
Basophil #: 0 10*3/uL (ref 0.0–0.1)
Basophil %: 0 %
Eosinophil #: 0.1 10*3/uL (ref 0.0–0.7)
Eosinophil %: 3 %
HCT: 38.7 % (ref 35.0–47.0)
HGB: 12.1 g/dL (ref 12.0–16.0)
LYMPHS PCT: 17.2 %
Lymphocyte #: 0.7 10*3/uL — ABNORMAL LOW (ref 1.0–3.6)
MCH: 29.1 pg (ref 26.0–34.0)
MCHC: 31.3 g/dL — AB (ref 32.0–36.0)
MCV: 93 fL (ref 80–100)
MONOS PCT: 5 %
Monocyte #: 0.2 x10 3/mm (ref 0.2–0.9)
Neutrophil #: 2.9 10*3/uL (ref 1.4–6.5)
Neutrophil %: 74.8 %
PLATELETS: 278 10*3/uL (ref 150–440)
RBC: 4.17 10*6/uL (ref 3.80–5.20)
RDW: 15.1 % — AB (ref 11.5–14.5)
WBC: 3.9 10*3/uL (ref 3.6–11.0)

## 2014-08-06 LAB — MAGNESIUM: Magnesium: 1.6 mg/dL — ABNORMAL LOW

## 2014-08-06 LAB — PHOSPHORUS: PHOSPHORUS: 3.5 mg/dL (ref 2.5–4.9)

## 2014-08-13 LAB — CBC WITH DIFFERENTIAL/PLATELET
BASOS PCT: 1 %
Basophil #: 0 10*3/uL (ref 0.0–0.1)
EOS ABS: 0.1 10*3/uL (ref 0.0–0.7)
EOS PCT: 1.8 %
HCT: 36.4 % (ref 35.0–47.0)
HGB: 11.4 g/dL — AB (ref 12.0–16.0)
LYMPHS ABS: 0.6 10*3/uL — AB (ref 1.0–3.6)
Lymphocyte %: 17.4 %
MCH: 28.8 pg (ref 26.0–34.0)
MCHC: 31.2 g/dL — ABNORMAL LOW (ref 32.0–36.0)
MCV: 92 fL (ref 80–100)
MONO ABS: 0.3 x10 3/mm (ref 0.2–0.9)
Monocyte %: 8.6 %
Neutrophil #: 2.6 10*3/uL (ref 1.4–6.5)
Neutrophil %: 71.2 %
Platelet: 269 10*3/uL (ref 150–440)
RBC: 3.95 10*6/uL (ref 3.80–5.20)
RDW: 15.4 % — ABNORMAL HIGH (ref 11.5–14.5)
WBC: 3.7 10*3/uL (ref 3.6–11.0)

## 2014-08-13 LAB — BASIC METABOLIC PANEL
Anion Gap: 8 (ref 7–16)
BUN: 20 mg/dL — ABNORMAL HIGH (ref 7–18)
CALCIUM: 8.9 mg/dL (ref 8.5–10.1)
CHLORIDE: 106 mmol/L (ref 98–107)
Co2: 19 mmol/L — ABNORMAL LOW (ref 21–32)
Creatinine: 1.97 mg/dL — ABNORMAL HIGH (ref 0.60–1.30)
EGFR (African American): 35 — ABNORMAL LOW
EGFR (Non-African Amer.): 29 — ABNORMAL LOW
Glucose: 98 mg/dL (ref 65–99)
Osmolality: 269 (ref 275–301)
POTASSIUM: 5.1 mmol/L (ref 3.5–5.1)
SODIUM: 133 mmol/L — AB (ref 136–145)

## 2014-08-13 LAB — MAGNESIUM: Magnesium: 1.6 mg/dL — ABNORMAL LOW

## 2014-08-13 LAB — PHOSPHORUS: PHOSPHORUS: 3.4 mg/dL (ref 2.5–4.9)

## 2014-08-15 ENCOUNTER — Encounter: Payer: Self-pay | Admitting: Cardiovascular Disease

## 2014-08-15 ENCOUNTER — Ambulatory Visit (INDEPENDENT_AMBULATORY_CARE_PROVIDER_SITE_OTHER): Payer: Medicare Other | Admitting: Cardiovascular Disease

## 2014-08-15 VITALS — BP 110/72 | HR 70 | Ht 66.5 in | Wt 204.2 lb

## 2014-08-15 DIAGNOSIS — N186 End stage renal disease: Secondary | ICD-10-CM

## 2014-08-15 DIAGNOSIS — I10 Essential (primary) hypertension: Secondary | ICD-10-CM

## 2014-08-15 DIAGNOSIS — I471 Supraventricular tachycardia: Secondary | ICD-10-CM

## 2014-08-15 NOTE — Progress Notes (Signed)
Patient ID: [name], female    DOB: 1966/01/24, 49 y.o.   MRN: [id number]  HPI Comments: [name] is a pleasant 49 year old woman with end-stage renal disease, on hemodialysis for the past 7 years, with history of seizure disorder, hypertension, stroke in March 2010 with intracranial bleed requiring intubation for at least one week at [facility], who has been unable to tolerate anticoagulation, history of narrow complex tachycardia with rate 160 beats per minute  11/02/2013, in the setting of upper respiratory infection, started on amiodarone. 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 This was done at [facility]. She has done well, reports having some problem in October with her electrolytes but otherwise no problems. She denies having any significant arrhythmia. Her amiodarone was held by [facility] Without the amiodarone, she reports doing well. She denies any lower extremity edema, shortness of breath or fluid retention All of her blood work is done through [facility]  EKG on today's visit shows normal sinus rhythm with rate 69 bpm, no significant ST or T-wave changes  Other past medical history She reports that she had severe allergies, sinus congestion, chest congestion on April 2. She was laying in bed when she had acute onset of tachycardia. Symptoms lasted 5 hours. She was not thinking clearly and finally went to the emergency room after family encouraged her to go. She had palpitations, shortness of breath, fatigue with malaise..  In the emergency room, she was given amiodarone infusion as it was unsure if she was in SVT or atrial flutter. Her rhythm broke and she was discharged on amiodarone by mouth twice a  day.   Allergies  Allergen Reactions  . Heparin   . Ibuprofen   . Penicillins     Outpatient Encounter Prescriptions as of 08/15/2014  Medication Sig  . acetaminophen (TYLENOL) 500 MG tablet Take 500 mg by mouth every 6 (six) hours as needed.  Marland Kitchen acetaminophen-codeine (TYLENOL #3) 300-30 MG per tablet Take 1 tablet by mouth every 6 (six) hours as needed.   Marland Kitchen aspirin 81 MG chewable tablet Chew 81 mg by mouth daily.   . carvedilol (COREG) 12.5 MG tablet Take 12.5 mg by mouth 2 (two) times daily with a meal.   . clindamycin (CLEOCIN) 300 MG capsule Take 2 capsules (600 mg) 30-60 minutes prior to dental procedure.  . dapsone 100 MG tablet Take 100 mg by mouth daily.   Marland Kitchen gabapentin (NEURONTIN) 300 MG capsule Take 3 capsules (300 mg) by mouth in the morning and at noon, 600 mg in the evening.  . levETIRAcetam (KEPPRA) 250 MG tablet Take 500 mg by mouth 2 (two) times daily.   . mycophenolate (MYFORTIC) 180 MG EC tablet Take 540 mg by mouth 2 (two) times daily.   Marland Kitchen NASONEX 50 MCG/ACT nasal spray Place 2 sprays into the nose daily.   Marland Kitchen omeprazole (PRILOSEC) 20 MG capsule Take 20 mg by mouth at bedtime.   . sodium bicarbonate 325 MG tablet Take 325 mg by mouth daily.  . sodium bicarbonate 650 MG tablet Take 2 tablets (1300 mg) three times a day.  . tacrolimus (PROGRAF) 1 MG capsule 6mg  in the am and 6mg  in the pm, V42.0 kidney transplant, tx date 03/24/14, DAW1  . [  DISCONTINUED] amiodarone (PACERONE) 200 MG tablet Take 1 tablet (200 mg total) by mouth 2 (two) times daily as needed. (Patient not taking: Reported on 08/15/2014)  . [DISCONTINUED] b complex-vitamin c-folic acid (NEPHRO-VITE) 0.8 MG TABS tablet Take 1 tablet by mouth at bedtime.  . [DISCONTINUED] cinacalcet (SENSIPAR) 60 MG tablet Take 60 mg by mouth daily.  . [DISCONTINUED] levETIRAcetam (KEPPRA) 250 MG tablet Take 500 mg by mouth 2 (two) times daily. Takes also 250 mg when has dialysis days.  . [DISCONTINUED] rOPINIRole (REQUIP) 0.5 MG  tablet Take 0.5 mg by mouth as needed.   . [DISCONTINUED] sevelamer (RENAGEL) 800 MG tablet Take 800 mg by mouth 3 (three) times daily with meals.  . [DISCONTINUED] SPS 15 GM/60ML suspension Take 60 g by mouth once a week.     Past Medical History  Diagnosis Date  . End stage renal disease     dialysis on Tues, Thursday, and Sat.   . Hypertension   . RLS (restless legs syndrome)   . End stage renal disease   . Seizure disorder   . History of methicillin resistant Staphylococcus aureus infection   . Cerebrovascular accident 2010    without any focal neurological deficits.   . Clotted renal dialysis AV graft   . Arrhythmia   . Dialysis patient     3 x weekly.     Past Surgical History  Procedure Laterality Date  . Insertion of dialysis catheter      x 2  . Kidney transplant  03/2014    right side.    Social History  reports that she has never smoked. She does not have any smokeless tobacco history on file. She reports that she does not drink alcohol or use illicit drugs.  Family History family history includes Heart attack in her father and mother; Hyperlipidemia in her father and mother; Hypertension in her father and mother.   Review of Systems  Constitutional: Negative.   Respiratory: Negative.   Cardiovascular: Negative.   Gastrointestinal: Negative.   Endocrine: Negative.   Musculoskeletal: Negative.   Skin: Negative.   Neurological: Negative.   Hematological: Negative.   Psychiatric/Behavioral: Negative.   All other systems reviewed and are negative.   BP 110/72 mmHg  Pulse 70  Ht 5' 6.5" (1.689 m)  Wt 204 lb 4 oz (92.647 kg)  BMI 32.48 kg/m2  Physical Exam  Constitutional: She is oriented to person, place, and time. She appears well-developed and well-nourished.  Obese  HENT:  Head: Normocephalic.  Nose: Nose normal.  Mouth/Throat: Oropharynx is clear and moist.  Eyes: Conjunctivae are normal. Pupils are equal, round, and reactive to light.  Neck:  Normal range of motion. Neck supple. No JVD present.  Cardiovascular: Normal rate, regular rhythm, S1 normal, S2 normal, normal heart sounds and intact distal pulses.  Exam reveals no gallop and no friction rub.   No murmur heard. Pulmonary/Chest: Effort normal and breath sounds normal. No respiratory distress. She has no wheezes. She has no rales. She exhibits no tenderness.  Abdominal: Soft. Bowel sounds are normal. She exhibits no distension. There is no tenderness.  Musculoskeletal: Normal range of motion. She exhibits no edema or tenderness.  Lymphadenopathy:    She has no cervical adenopathy.  Neurological: She is alert and oriented to person, place, and time. Coordination normal.  Skin: Skin is warm and dry. No rash noted. No erythema.  Psychiatric: She has a normal mood and affect. Her behavior is normal. Judgment and thought content normal.  Assessment and Plan  Nursing note and vitals reviewed.

## 2014-08-15 NOTE — Assessment & Plan Note (Signed)
Blood pressure is well controlled on today's visit. No changes made to the medications. 

## 2014-08-15 NOTE — Patient Instructions (Signed)
You are doing well. No medication changes were made.  Please call us if you have new issues that need to be addressed before your next appt.  Your physician wants you to follow-up in: 12 months.  You will receive a reminder letter in the mail two months in advance. If you don't receive a letter, please call our office to schedule the follow-up appointment. 

## 2014-08-15 NOTE — Assessment & Plan Note (Signed)
Renal transplant in 2015. Doing well

## 2014-08-15 NOTE — Assessment & Plan Note (Signed)
No significant arrhythmia. She is off the amiodarone We have instructed her to call our office for any tachycardia

## 2014-08-20 LAB — CBC WITH DIFFERENTIAL/PLATELET
BASOS PCT: 1.3 %
Basophil #: 0.1 10*3/uL (ref 0.0–0.1)
EOS ABS: 0.1 10*3/uL (ref 0.0–0.7)
EOS PCT: 2.5 %
HCT: 37 % (ref 35.0–47.0)
HGB: 11.6 g/dL — ABNORMAL LOW (ref 12.0–16.0)
Lymphocyte #: 0.8 10*3/uL — ABNORMAL LOW (ref 1.0–3.6)
Lymphocyte %: 18.8 %
MCH: 28.1 pg (ref 26.0–34.0)
MCHC: 31.2 g/dL — AB (ref 32.0–36.0)
MCV: 90 fL (ref 80–100)
MONOS PCT: 9.4 %
Monocyte #: 0.4 x10 3/mm (ref 0.2–0.9)
NEUTROS ABS: 2.8 10*3/uL (ref 1.4–6.5)
Neutrophil %: 68 %
Platelet: 355 10*3/uL (ref 150–440)
RBC: 4.11 10*6/uL (ref 3.80–5.20)
RDW: 15.6 % — ABNORMAL HIGH (ref 11.5–14.5)
WBC: 4.1 10*3/uL (ref 3.6–11.0)

## 2014-08-20 LAB — BASIC METABOLIC PANEL
Anion Gap: 7 (ref 7–16)
BUN: 24 mg/dL — AB (ref 7–18)
Calcium, Total: 8.9 mg/dL (ref 8.5–10.1)
Chloride: 107 mmol/L (ref 98–107)
Co2: 21 mmol/L (ref 21–32)
Creatinine: 1.88 mg/dL — ABNORMAL HIGH (ref 0.60–1.30)
EGFR (African American): 37 — ABNORMAL LOW
GFR CALC NON AF AMER: 30 — AB
GLUCOSE: 98 mg/dL (ref 65–99)
Osmolality: 274 (ref 275–301)
POTASSIUM: 5.3 mmol/L — AB (ref 3.5–5.1)
Sodium: 135 mmol/L — ABNORMAL LOW (ref 136–145)

## 2014-08-20 LAB — PHOSPHORUS: Phosphorus: 3.4 mg/dL (ref 2.5–4.9)

## 2014-08-20 LAB — MAGNESIUM: Magnesium: 1.7 mg/dL — ABNORMAL LOW

## 2014-09-03 ENCOUNTER — Other Ambulatory Visit: Payer: Self-pay | Admitting: Nephrology

## 2014-09-10 LAB — CBC WITH DIFFERENTIAL/PLATELET
BASOS PCT: 0.7 %
Basophil #: 0.1 10*3/uL (ref 0.0–0.1)
EOS PCT: 1.8 %
Eosinophil #: 0.2 10*3/uL (ref 0.0–0.7)
HCT: 35.5 % (ref 35.0–47.0)
HGB: 11.3 g/dL — ABNORMAL LOW (ref 12.0–16.0)
LYMPHS PCT: 14.3 %
Lymphocyte #: 1.2 10*3/uL (ref 1.0–3.6)
MCH: 28 pg (ref 26.0–34.0)
MCHC: 31.8 g/dL — ABNORMAL LOW (ref 32.0–36.0)
MCV: 88 fL (ref 80–100)
MONO ABS: 0.6 x10 3/mm (ref 0.2–0.9)
Monocyte %: 7.5 %
Neutrophil #: 6.5 10*3/uL (ref 1.4–6.5)
Neutrophil %: 75.7 %
Platelet: 302 10*3/uL (ref 150–440)
RBC: 4.04 10*6/uL (ref 3.80–5.20)
RDW: 16.8 % — ABNORMAL HIGH (ref 11.5–14.5)
WBC: 8.6 10*3/uL (ref 3.6–11.0)

## 2014-09-10 LAB — BASIC METABOLIC PANEL
ANION GAP: 10 (ref 7–16)
BUN: 27 mg/dL — AB (ref 7–18)
CHLORIDE: 108 mmol/L — AB (ref 98–107)
Calcium, Total: 9 mg/dL (ref 8.5–10.1)
Co2: 19 mmol/L — ABNORMAL LOW (ref 21–32)
Creatinine: 2.32 mg/dL — ABNORMAL HIGH (ref 0.60–1.30)
EGFR (African American): 29 — ABNORMAL LOW
GFR CALC NON AF AMER: 24 — AB
Glucose: 115 mg/dL — ABNORMAL HIGH (ref 65–99)
OSMOLALITY: 280 (ref 275–301)
Potassium: 5.3 mmol/L — ABNORMAL HIGH (ref 3.5–5.1)
Sodium: 137 mmol/L (ref 136–145)

## 2014-09-10 LAB — MAGNESIUM: Magnesium: 1.7 mg/dL — ABNORMAL LOW

## 2014-09-10 LAB — PHOSPHORUS: Phosphorus: 3.9 mg/dL (ref 2.5–4.9)

## 2014-10-02 ENCOUNTER — Other Ambulatory Visit
Admit: 2014-10-02 | Disposition: A | Discharge status: Home / Self Care | Payer: Self-pay | Attending: Nephrology | Admitting: Nephrology

## 2014-11-02 ENCOUNTER — Other Ambulatory Visit
Admit: 2014-11-02 | Disposition: A | Discharge status: Home / Self Care | Payer: Self-pay | Attending: Nephrology | Admitting: Nephrology

## 2014-11-09 LAB — BASIC METABOLIC PANEL
Anion Gap: 7 (ref 7–16)
BUN: 35 mg/dL — ABNORMAL HIGH
CREATININE: 2.16 mg/dL — AB
Calcium, Total: 9.5 mg/dL
Chloride: 107 mmol/L
Co2: 21 mmol/L — ABNORMAL LOW
EGFR (African American): 31 — ABNORMAL LOW
EGFR (Non-African Amer.): 26 — ABNORMAL LOW
Glucose: 103 mg/dL — ABNORMAL HIGH
Potassium: 5 mmol/L
Sodium: 135 mmol/L

## 2014-11-09 LAB — CBC WITH DIFFERENTIAL/PLATELET
Basophil #: 0 10*3/uL (ref 0.0–0.1)
Basophil %: 0.5 %
Eosinophil #: 0.1 10*3/uL (ref 0.0–0.7)
Eosinophil %: 1.3 %
HCT: 39.1 % (ref 35.0–47.0)
HGB: 12.3 g/dL (ref 12.0–16.0)
Lymphocyte #: 1 10*3/uL (ref 1.0–3.6)
Lymphocyte %: 15.2 %
MCH: 27.7 pg (ref 26.0–34.0)
MCHC: 31.3 g/dL — ABNORMAL LOW (ref 32.0–36.0)
MCV: 88 fL (ref 80–100)
Monocyte #: 0.5 x10 3/mm (ref 0.2–0.9)
Monocyte %: 7.5 %
Neutrophil #: 4.9 10*3/uL (ref 1.4–6.5)
Neutrophil %: 75.5 %
Platelet: 237 10*3/uL (ref 150–440)
RBC: 4.43 10*6/uL (ref 3.80–5.20)
RDW: 17.1 % — ABNORMAL HIGH (ref 11.5–14.5)
WBC: 6.4 10*3/uL (ref 3.6–11.0)

## 2014-11-09 LAB — MAGNESIUM: Magnesium: 1.7 mg/dL

## 2014-11-09 LAB — PHOSPHORUS: Phosphorus: 3.4 mg/dL

## 2014-11-19 LAB — CBC WITH DIFFERENTIAL/PLATELET
BASOS ABS: 0.1 10*3/uL (ref 0.0–0.1)
BASOS PCT: 0.8 %
Eosinophil #: 0.1 10*3/uL (ref 0.0–0.7)
Eosinophil %: 1.8 %
HCT: 42.1 % (ref 35.0–47.0)
HGB: 13.2 g/dL (ref 12.0–16.0)
Lymphocyte #: 1.3 10*3/uL (ref 1.0–3.6)
Lymphocyte %: 19.9 %
MCH: 27.9 pg (ref 26.0–34.0)
MCHC: 31.3 g/dL — AB (ref 32.0–36.0)
MCV: 89 fL (ref 80–100)
MONO ABS: 0.5 x10 3/mm (ref 0.2–0.9)
Monocyte %: 7.2 %
NEUTROS PCT: 70.3 %
Neutrophil #: 4.4 10*3/uL (ref 1.4–6.5)
PLATELETS: 247 10*3/uL (ref 150–440)
RBC: 4.73 10*6/uL (ref 3.80–5.20)
RDW: 16.1 % — ABNORMAL HIGH (ref 11.5–14.5)
WBC: 6.3 10*3/uL (ref 3.6–11.0)

## 2014-11-19 LAB — PHOSPHORUS: Phosphorus: 4 mg/dL

## 2014-11-19 LAB — BASIC METABOLIC PANEL
ANION GAP: 6 — AB (ref 7–16)
BUN: 25 mg/dL — ABNORMAL HIGH
CO2: 23 mmol/L
CREATININE: 1.87 mg/dL — AB
Calcium, Total: 9.7 mg/dL
Chloride: 106 mmol/L
EGFR (African American): 36 — ABNORMAL LOW
EGFR (Non-African Amer.): 31 — ABNORMAL LOW
Glucose: 106 mg/dL — ABNORMAL HIGH
Potassium: 5 mmol/L
Sodium: 135 mmol/L

## 2014-11-19 LAB — MAGNESIUM: MAGNESIUM: 1.7 mg/dL

## 2014-12-07 ENCOUNTER — Other Ambulatory Visit
Admission: RE | Admit: 2014-12-07 | Discharge: 2014-12-07 | Disposition: A | Discharge status: Home / Self Care | Payer: Medicare Other | Source: Ambulatory Visit | Attending: Nephrology | Admitting: Nephrology

## 2014-12-07 DIAGNOSIS — Z789 Other specified health status: Secondary | ICD-10-CM | POA: Diagnosis not present

## 2014-12-07 DIAGNOSIS — E1129 Type 2 diabetes mellitus with other diabetic kidney complication: Secondary | ICD-10-CM | POA: Insufficient documentation

## 2014-12-07 DIAGNOSIS — E559 Vitamin D deficiency, unspecified: Secondary | ICD-10-CM | POA: Insufficient documentation

## 2014-12-07 DIAGNOSIS — Z1159 Encounter for screening for other viral diseases: Secondary | ICD-10-CM | POA: Insufficient documentation

## 2014-12-07 DIAGNOSIS — D631 Anemia in chronic kidney disease: Secondary | ICD-10-CM | POA: Diagnosis not present

## 2014-12-07 DIAGNOSIS — Z79899 Other long term (current) drug therapy: Secondary | ICD-10-CM | POA: Diagnosis not present

## 2014-12-07 DIAGNOSIS — N390 Urinary tract infection, site not specified: Secondary | ICD-10-CM | POA: Diagnosis not present

## 2014-12-07 DIAGNOSIS — Z940 Kidney transplant status: Secondary | ICD-10-CM | POA: Insufficient documentation

## 2014-12-07 LAB — CBC WITH DIFFERENTIAL/PLATELET
Basophils Absolute: 0 10*3/uL (ref 0–0.1)
Basophils Relative: 1 %
EOS ABS: 0.1 10*3/uL (ref 0–0.7)
EOS PCT: 2 %
HCT: 40.6 % (ref 35.0–47.0)
Hemoglobin: 12.8 g/dL (ref 12.0–16.0)
LYMPHS ABS: 0.8 10*3/uL — AB (ref 1.0–3.6)
Lymphocytes Relative: 14 %
MCH: 28.1 pg (ref 26.0–34.0)
MCHC: 31.6 g/dL — AB (ref 32.0–36.0)
MCV: 88.7 fL (ref 80.0–100.0)
MONO ABS: 0.5 10*3/uL (ref 0.2–0.9)
MONOS PCT: 8 %
Neutro Abs: 4.8 10*3/uL (ref 1.4–6.5)
Neutrophils Relative %: 75 %
PLATELETS: 229 10*3/uL (ref 150–440)
RBC: 4.57 MIL/uL (ref 3.80–5.20)
RDW: 15.6 % — ABNORMAL HIGH (ref 11.5–14.5)
WBC: 6.2 10*3/uL (ref 3.6–11.0)

## 2014-12-07 LAB — LIPID PANEL
CHOL/HDL RATIO: 6.3 ratio
Cholesterol: 213 mg/dL — ABNORMAL HIGH (ref 0–200)
HDL: 34 mg/dL — ABNORMAL LOW (ref >40)
LDL Cholesterol: 144 mg/dL — ABNORMAL HIGH (ref 0–99)
Triglycerides: 175 mg/dL — ABNORMAL HIGH (ref <150)
VLDL: 35 mg/dL (ref 0–40)

## 2014-12-07 LAB — COMPREHENSIVE METABOLIC PANEL
ALT: 12 U/L — ABNORMAL LOW (ref 14–54)
ANION GAP: 6 (ref 5–15)
AST: 14 U/L — ABNORMAL LOW (ref 15–41)
Albumin: 3.8 g/dL (ref 3.5–5.0)
Alkaline Phosphatase: 67 U/L (ref 38–126)
BILIRUBIN TOTAL: 0.4 mg/dL (ref 0.3–1.2)
BUN: 28 mg/dL — ABNORMAL HIGH (ref 6–20)
CO2: 23 mmol/L (ref 22–32)
CREATININE: 1.94 mg/dL — AB (ref 0.44–1.00)
Calcium: 9.2 mg/dL (ref 8.9–10.3)
Chloride: 105 mmol/L (ref 101–111)
GFR calc Af Amer: 34 mL/min — ABNORMAL LOW (ref >60)
GFR, EST NON AFRICAN AMERICAN: 29 mL/min — AB (ref >60)
Glucose, Bld: 97 mg/dL (ref 65–99)
Potassium: 4.9 mmol/L (ref 3.5–5.1)
Sodium: 134 mmol/L — ABNORMAL LOW (ref 135–145)
Total Protein: 7.1 g/dL (ref 6.5–8.1)

## 2014-12-07 LAB — GAMMA GT: GGT: 28 U/L (ref 7–50)

## 2014-12-07 LAB — MAGNESIUM: Magnesium: 1.6 mg/dL — ABNORMAL LOW (ref 1.7–2.4)

## 2014-12-07 LAB — PHOSPHORUS: Phosphorus: 3.6 mg/dL (ref 2.5–4.6)

## 2014-12-21 ENCOUNTER — Encounter
Admission: RE | Admit: 2014-12-21 | Discharge: 2014-12-21 | Disposition: A | Discharge status: Home / Self Care | Payer: Medicare Other | Source: Ambulatory Visit | Attending: Nephrology | Admitting: Nephrology

## 2014-12-21 DIAGNOSIS — E559 Vitamin D deficiency, unspecified: Secondary | ICD-10-CM | POA: Insufficient documentation

## 2014-12-21 DIAGNOSIS — Z114 Encounter for screening for human immunodeficiency virus [HIV]: Secondary | ICD-10-CM | POA: Diagnosis not present

## 2014-12-21 DIAGNOSIS — Z9483 Pancreas transplant status: Secondary | ICD-10-CM | POA: Insufficient documentation

## 2014-12-21 DIAGNOSIS — Z940 Kidney transplant status: Secondary | ICD-10-CM | POA: Insufficient documentation

## 2014-12-21 DIAGNOSIS — T8610 Unspecified complication of kidney transplant: Secondary | ICD-10-CM | POA: Insufficient documentation

## 2014-12-21 DIAGNOSIS — Z09 Encounter for follow-up examination after completed treatment for conditions other than malignant neoplasm: Secondary | ICD-10-CM | POA: Diagnosis present

## 2014-12-21 DIAGNOSIS — Z79899 Other long term (current) drug therapy: Secondary | ICD-10-CM | POA: Insufficient documentation

## 2014-12-21 DIAGNOSIS — D631 Anemia in chronic kidney disease: Secondary | ICD-10-CM | POA: Diagnosis not present

## 2014-12-21 LAB — CBC WITH DIFFERENTIAL/PLATELET
Basophils Absolute: 0 10*3/uL (ref 0–0.1)
Basophils Relative: 1 %
Eosinophils Absolute: 0.1 10*3/uL (ref 0–0.7)
Eosinophils Relative: 2 %
HEMATOCRIT: 41.8 % (ref 35.0–47.0)
Hemoglobin: 13.3 g/dL (ref 12.0–16.0)
LYMPHS PCT: 15 %
Lymphs Abs: 0.9 10*3/uL — ABNORMAL LOW (ref 1.0–3.6)
MCH: 28.3 pg (ref 26.0–34.0)
MCHC: 31.9 g/dL — ABNORMAL LOW (ref 32.0–36.0)
MCV: 88.9 fL (ref 80.0–100.0)
MONOS PCT: 9 %
Monocytes Absolute: 0.5 10*3/uL (ref 0.2–0.9)
NEUTROS ABS: 4.6 10*3/uL (ref 1.4–6.5)
Neutrophils Relative %: 73 %
Platelets: 224 10*3/uL (ref 150–440)
RBC: 4.7 MIL/uL (ref 3.80–5.20)
RDW: 15.5 % — AB (ref 11.5–14.5)
WBC: 6.3 10*3/uL (ref 3.6–11.0)

## 2014-12-21 LAB — COMPREHENSIVE METABOLIC PANEL
ALT: 14 U/L (ref 14–54)
ANION GAP: 5 (ref 5–15)
AST: 14 U/L — ABNORMAL LOW (ref 15–41)
Albumin: 4.1 g/dL (ref 3.5–5.0)
Alkaline Phosphatase: 66 U/L (ref 38–126)
BUN: 24 mg/dL — AB (ref 6–20)
CALCIUM: 9.6 mg/dL (ref 8.9–10.3)
CO2: 23 mmol/L (ref 22–32)
Chloride: 106 mmol/L (ref 101–111)
Creatinine, Ser: 1.84 mg/dL — ABNORMAL HIGH (ref 0.44–1.00)
GFR calc Af Amer: 36 mL/min — ABNORMAL LOW (ref >60)
GFR calc non Af Amer: 31 mL/min — ABNORMAL LOW (ref >60)
Glucose, Bld: 106 mg/dL — ABNORMAL HIGH (ref 65–99)
Potassium: 5.7 mmol/L — ABNORMAL HIGH (ref 3.5–5.1)
SODIUM: 134 mmol/L — AB (ref 135–145)
Total Bilirubin: 0.1 mg/dL — ABNORMAL LOW (ref 0.3–1.2)
Total Protein: 7.8 g/dL (ref 6.5–8.1)

## 2014-12-21 LAB — PHOSPHORUS: Phosphorus: 3.4 mg/dL (ref 2.5–4.6)

## 2014-12-21 LAB — MAGNESIUM: Magnesium: 1.7 mg/dL (ref 1.7–2.4)

## 2015-01-09 ENCOUNTER — Encounter
Admission: RE | Admit: 2015-01-09 | Discharge: 2015-01-09 | Disposition: A | Discharge status: Home / Self Care | Payer: Medicare Other | Source: Ambulatory Visit | Attending: Specialist | Admitting: Specialist

## 2015-01-09 DIAGNOSIS — B259 Cytomegaloviral disease, unspecified: Secondary | ICD-10-CM | POA: Diagnosis not present

## 2015-01-09 DIAGNOSIS — Z114 Encounter for screening for human immunodeficiency virus [HIV]: Secondary | ICD-10-CM | POA: Insufficient documentation

## 2015-01-09 DIAGNOSIS — D631 Anemia in chronic kidney disease: Secondary | ICD-10-CM | POA: Insufficient documentation

## 2015-01-09 DIAGNOSIS — E1129 Type 2 diabetes mellitus with other diabetic kidney complication: Secondary | ICD-10-CM | POA: Diagnosis not present

## 2015-01-09 DIAGNOSIS — D899 Disorder involving the immune mechanism, unspecified: Secondary | ICD-10-CM | POA: Insufficient documentation

## 2015-01-09 DIAGNOSIS — Z09 Encounter for follow-up examination after completed treatment for conditions other than malignant neoplasm: Secondary | ICD-10-CM | POA: Diagnosis not present

## 2015-01-09 DIAGNOSIS — E559 Vitamin D deficiency, unspecified: Secondary | ICD-10-CM | POA: Insufficient documentation

## 2015-01-09 DIAGNOSIS — T8610 Unspecified complication of kidney transplant: Secondary | ICD-10-CM | POA: Insufficient documentation

## 2015-01-09 DIAGNOSIS — Z79899 Other long term (current) drug therapy: Secondary | ICD-10-CM | POA: Diagnosis not present

## 2015-01-09 DIAGNOSIS — Z9483 Pancreas transplant status: Secondary | ICD-10-CM | POA: Diagnosis not present

## 2015-01-09 DIAGNOSIS — Z789 Other specified health status: Secondary | ICD-10-CM | POA: Diagnosis not present

## 2015-01-09 DIAGNOSIS — N390 Urinary tract infection, site not specified: Secondary | ICD-10-CM | POA: Diagnosis not present

## 2015-01-09 DIAGNOSIS — Z940 Kidney transplant status: Secondary | ICD-10-CM | POA: Diagnosis present

## 2015-01-09 LAB — CBC WITH DIFFERENTIAL/PLATELET
Basophils Absolute: 0 10*3/uL (ref 0–0.1)
Basophils Relative: 1 %
EOS ABS: 0.1 10*3/uL (ref 0–0.7)
Eosinophils Relative: 2 %
HCT: 39.4 % (ref 35.0–47.0)
Hemoglobin: 12.8 g/dL (ref 12.0–16.0)
LYMPHS PCT: 15 %
Lymphs Abs: 1 10*3/uL (ref 1.0–3.6)
MCH: 29 pg (ref 26.0–34.0)
MCHC: 32.5 g/dL (ref 32.0–36.0)
MCV: 89.1 fL (ref 80.0–100.0)
MONO ABS: 0.5 10*3/uL (ref 0.2–0.9)
MONOS PCT: 8 %
NEUTROS ABS: 4.8 10*3/uL (ref 1.4–6.5)
Neutrophils Relative %: 74 %
PLATELETS: 231 10*3/uL (ref 150–440)
RBC: 4.42 MIL/uL (ref 3.80–5.20)
RDW: 15.6 % — ABNORMAL HIGH (ref 11.5–14.5)
WBC: 6.4 10*3/uL (ref 3.6–11.0)

## 2015-01-09 LAB — BASIC METABOLIC PANEL
Anion gap: 3 — ABNORMAL LOW (ref 5–15)
BUN: 32 mg/dL — AB (ref 6–20)
CO2: 21 mmol/L — ABNORMAL LOW (ref 22–32)
CREATININE: 1.78 mg/dL — AB (ref 0.44–1.00)
Calcium: 9.5 mg/dL (ref 8.9–10.3)
Chloride: 110 mmol/L (ref 101–111)
GFR calc Af Amer: 38 mL/min — ABNORMAL LOW (ref >60)
GFR, EST NON AFRICAN AMERICAN: 33 mL/min — AB (ref >60)
GLUCOSE: 102 mg/dL — AB (ref 65–99)
POTASSIUM: 5.5 mmol/L — AB (ref 3.5–5.1)
Sodium: 134 mmol/L — ABNORMAL LOW (ref 135–145)

## 2015-01-09 LAB — MAGNESIUM: MAGNESIUM: 1.7 mg/dL (ref 1.7–2.4)

## 2015-01-09 LAB — PHOSPHORUS: PHOSPHORUS: 3.6 mg/dL (ref 2.5–4.6)

## 2015-02-19 ENCOUNTER — Other Ambulatory Visit
Admission: RE | Admit: 2015-02-19 | Discharge: 2015-02-19 | Disposition: A | Discharge status: Home / Self Care | Payer: Medicare Other | Source: Ambulatory Visit | Attending: Nephrology | Admitting: Nephrology

## 2015-02-19 DIAGNOSIS — Z79899 Other long term (current) drug therapy: Secondary | ICD-10-CM | POA: Insufficient documentation

## 2015-02-19 DIAGNOSIS — Z09 Encounter for follow-up examination after completed treatment for conditions other than malignant neoplasm: Secondary | ICD-10-CM | POA: Diagnosis present

## 2015-02-19 DIAGNOSIS — N390 Urinary tract infection, site not specified: Secondary | ICD-10-CM | POA: Insufficient documentation

## 2015-02-19 DIAGNOSIS — Z940 Kidney transplant status: Secondary | ICD-10-CM | POA: Diagnosis present

## 2015-02-19 DIAGNOSIS — D631 Anemia in chronic kidney disease: Secondary | ICD-10-CM | POA: Diagnosis not present

## 2015-02-19 DIAGNOSIS — Z9483 Pancreas transplant status: Secondary | ICD-10-CM | POA: Insufficient documentation

## 2015-02-19 DIAGNOSIS — T8610 Unspecified complication of kidney transplant: Secondary | ICD-10-CM | POA: Insufficient documentation

## 2015-02-19 DIAGNOSIS — Z789 Other specified health status: Secondary | ICD-10-CM | POA: Insufficient documentation

## 2015-02-19 DIAGNOSIS — E559 Vitamin D deficiency, unspecified: Secondary | ICD-10-CM | POA: Insufficient documentation

## 2015-02-19 DIAGNOSIS — Z114 Encounter for screening for human immunodeficiency virus [HIV]: Secondary | ICD-10-CM | POA: Diagnosis not present

## 2015-02-19 DIAGNOSIS — D899 Disorder involving the immune mechanism, unspecified: Secondary | ICD-10-CM | POA: Insufficient documentation

## 2015-02-19 LAB — BASIC METABOLIC PANEL
Anion gap: 4 — ABNORMAL LOW (ref 5–15)
BUN: 29 mg/dL — ABNORMAL HIGH (ref 6–20)
CO2: 23 mmol/L (ref 22–32)
CREATININE: 1.9 mg/dL — AB (ref 0.44–1.00)
Calcium: 9.4 mg/dL (ref 8.9–10.3)
Chloride: 107 mmol/L (ref 101–111)
GFR calc Af Amer: 35 mL/min — ABNORMAL LOW (ref >60)
GFR calc non Af Amer: 30 mL/min — ABNORMAL LOW (ref >60)
Glucose, Bld: 106 mg/dL — ABNORMAL HIGH (ref 65–99)
Potassium: 4.8 mmol/L (ref 3.5–5.1)
Sodium: 134 mmol/L — ABNORMAL LOW (ref 135–145)

## 2015-02-19 LAB — CBC WITH DIFFERENTIAL/PLATELET
BASOS PCT: 1 %
Basophils Absolute: 0 10*3/uL (ref 0–0.1)
Eosinophils Absolute: 0.2 10*3/uL (ref 0–0.7)
Eosinophils Relative: 2 %
HCT: 40.5 % (ref 35.0–47.0)
Hemoglobin: 13 g/dL (ref 12.0–16.0)
LYMPHS PCT: 15 %
Lymphs Abs: 1.2 10*3/uL (ref 1.0–3.6)
MCH: 28.4 pg (ref 26.0–34.0)
MCHC: 32.1 g/dL (ref 32.0–36.0)
MCV: 88.5 fL (ref 80.0–100.0)
Monocytes Absolute: 0.6 10*3/uL (ref 0.2–0.9)
Monocytes Relative: 8 %
NEUTROS ABS: 5.7 10*3/uL (ref 1.4–6.5)
Neutrophils Relative %: 74 %
PLATELETS: 229 10*3/uL (ref 150–440)
RBC: 4.58 MIL/uL (ref 3.80–5.20)
RDW: 14.2 % (ref 11.5–14.5)
WBC: 7.6 10*3/uL (ref 3.6–11.0)

## 2015-02-19 LAB — MAGNESIUM: Magnesium: 1.8 mg/dL (ref 1.7–2.4)

## 2015-02-19 LAB — PHOSPHORUS: PHOSPHORUS: 3.2 mg/dL (ref 2.5–4.6)

## 2015-02-20 LAB — TACROLIMUS LEVEL: Tacrolimus (FK506) - LabCorp: 6 ng/mL (ref 2.0–20.0)

## 2015-04-25 ENCOUNTER — Encounter
Admission: RE | Admit: 2015-04-25 | Discharge: 2015-04-25 | Disposition: A | Discharge status: Home / Self Care | Payer: Medicare Other | Source: Ambulatory Visit | Attending: Nephrology | Admitting: Nephrology

## 2015-04-25 DIAGNOSIS — N390 Urinary tract infection, site not specified: Secondary | ICD-10-CM | POA: Insufficient documentation

## 2015-04-25 DIAGNOSIS — B259 Cytomegaloviral disease, unspecified: Secondary | ICD-10-CM | POA: Diagnosis not present

## 2015-04-25 DIAGNOSIS — Z79899 Other long term (current) drug therapy: Secondary | ICD-10-CM | POA: Diagnosis present

## 2015-04-25 DIAGNOSIS — Z09 Encounter for follow-up examination after completed treatment for conditions other than malignant neoplasm: Secondary | ICD-10-CM | POA: Diagnosis not present

## 2015-04-25 DIAGNOSIS — Z114 Encounter for screening for human immunodeficiency virus [HIV]: Secondary | ICD-10-CM | POA: Insufficient documentation

## 2015-04-25 DIAGNOSIS — Z789 Other specified health status: Secondary | ICD-10-CM | POA: Diagnosis not present

## 2015-04-25 DIAGNOSIS — Z940 Kidney transplant status: Secondary | ICD-10-CM | POA: Diagnosis present

## 2015-04-25 DIAGNOSIS — E559 Vitamin D deficiency, unspecified: Secondary | ICD-10-CM | POA: Insufficient documentation

## 2015-04-25 DIAGNOSIS — Z9483 Pancreas transplant status: Secondary | ICD-10-CM | POA: Diagnosis not present

## 2015-04-25 DIAGNOSIS — D899 Disorder involving the immune mechanism, unspecified: Secondary | ICD-10-CM | POA: Insufficient documentation

## 2015-04-25 DIAGNOSIS — T8610 Unspecified complication of kidney transplant: Secondary | ICD-10-CM | POA: Diagnosis not present

## 2015-04-25 DIAGNOSIS — E1129 Type 2 diabetes mellitus with other diabetic kidney complication: Secondary | ICD-10-CM | POA: Insufficient documentation

## 2015-04-25 LAB — CBC WITH DIFFERENTIAL/PLATELET
BASOS ABS: 0 10*3/uL (ref 0–0.1)
Basophils Relative: 1 %
Eosinophils Absolute: 0.2 10*3/uL (ref 0–0.7)
Eosinophils Relative: 2 %
HEMATOCRIT: 41.3 % (ref 35.0–47.0)
Hemoglobin: 13.1 g/dL (ref 12.0–16.0)
LYMPHS PCT: 15 %
Lymphs Abs: 1.2 10*3/uL (ref 1.0–3.6)
MCH: 28.3 pg (ref 26.0–34.0)
MCHC: 31.7 g/dL — ABNORMAL LOW (ref 32.0–36.0)
MCV: 89.5 fL (ref 80.0–100.0)
Monocytes Absolute: 0.5 10*3/uL (ref 0.2–0.9)
Monocytes Relative: 7 %
NEUTROS ABS: 5.8 10*3/uL (ref 1.4–6.5)
Neutrophils Relative %: 75 %
PLATELETS: 246 10*3/uL (ref 150–440)
RBC: 4.61 MIL/uL (ref 3.80–5.20)
RDW: 13.8 % (ref 11.5–14.5)
WBC: 7.7 10*3/uL (ref 3.6–11.0)

## 2015-04-25 LAB — BASIC METABOLIC PANEL
ANION GAP: 6 (ref 5–15)
BUN: 32 mg/dL — ABNORMAL HIGH (ref 6–20)
CO2: 23 mmol/L (ref 22–32)
Calcium: 9.8 mg/dL (ref 8.9–10.3)
Chloride: 105 mmol/L (ref 101–111)
Creatinine, Ser: 1.81 mg/dL — ABNORMAL HIGH (ref 0.44–1.00)
GFR calc Af Amer: 37 mL/min — ABNORMAL LOW (ref >60)
GFR calc non Af Amer: 32 mL/min — ABNORMAL LOW (ref >60)
Glucose, Bld: 108 mg/dL — ABNORMAL HIGH (ref 65–99)
POTASSIUM: 5.2 mmol/L — AB (ref 3.5–5.1)
SODIUM: 134 mmol/L — AB (ref 135–145)

## 2015-04-25 LAB — PHOSPHORUS: Phosphorus: 3 mg/dL (ref 2.5–4.6)

## 2015-04-25 LAB — MAGNESIUM: Magnesium: 1.8 mg/dL (ref 1.7–2.4)

## 2015-05-06 ENCOUNTER — Encounter
Admission: RE | Admit: 2015-05-06 | Discharge: 2015-05-06 | Disposition: A | Discharge status: Home / Self Care | Payer: Medicare Other | Source: Ambulatory Visit | Attending: Nephrology | Admitting: Nephrology

## 2015-05-06 DIAGNOSIS — E1129 Type 2 diabetes mellitus with other diabetic kidney complication: Secondary | ICD-10-CM | POA: Insufficient documentation

## 2015-05-06 DIAGNOSIS — Z09 Encounter for follow-up examination after completed treatment for conditions other than malignant neoplasm: Secondary | ICD-10-CM | POA: Insufficient documentation

## 2015-05-06 DIAGNOSIS — E559 Vitamin D deficiency, unspecified: Secondary | ICD-10-CM | POA: Insufficient documentation

## 2015-05-06 DIAGNOSIS — Z9483 Pancreas transplant status: Secondary | ICD-10-CM | POA: Diagnosis not present

## 2015-05-06 DIAGNOSIS — B259 Cytomegaloviral disease, unspecified: Secondary | ICD-10-CM | POA: Insufficient documentation

## 2015-05-06 DIAGNOSIS — D899 Disorder involving the immune mechanism, unspecified: Secondary | ICD-10-CM | POA: Insufficient documentation

## 2015-05-06 DIAGNOSIS — Z79899 Other long term (current) drug therapy: Secondary | ICD-10-CM | POA: Insufficient documentation

## 2015-05-06 DIAGNOSIS — Z114 Encounter for screening for human immunodeficiency virus [HIV]: Secondary | ICD-10-CM | POA: Diagnosis not present

## 2015-05-06 DIAGNOSIS — Z789 Other specified health status: Secondary | ICD-10-CM | POA: Insufficient documentation

## 2015-05-06 DIAGNOSIS — Z940 Kidney transplant status: Secondary | ICD-10-CM | POA: Insufficient documentation

## 2015-05-06 DIAGNOSIS — N390 Urinary tract infection, site not specified: Secondary | ICD-10-CM | POA: Insufficient documentation

## 2015-05-06 DIAGNOSIS — T8610 Unspecified complication of kidney transplant: Secondary | ICD-10-CM | POA: Diagnosis not present

## 2015-05-06 LAB — BASIC METABOLIC PANEL
ANION GAP: 3 — AB (ref 5–15)
BUN: 25 mg/dL — ABNORMAL HIGH (ref 6–20)
CO2: 21 mmol/L — ABNORMAL LOW (ref 22–32)
Calcium: 9.7 mg/dL (ref 8.9–10.3)
Chloride: 111 mmol/L (ref 101–111)
Creatinine, Ser: 1.68 mg/dL — ABNORMAL HIGH (ref 0.44–1.00)
GFR, EST AFRICAN AMERICAN: 41 mL/min — AB (ref >60)
GFR, EST NON AFRICAN AMERICAN: 35 mL/min — AB (ref >60)
Glucose, Bld: 114 mg/dL — ABNORMAL HIGH (ref 65–99)
POTASSIUM: 4.9 mmol/L (ref 3.5–5.1)
Sodium: 135 mmol/L (ref 135–145)

## 2015-05-06 LAB — CBC WITH DIFFERENTIAL/PLATELET
BASOS ABS: 0.1 10*3/uL (ref 0–0.1)
BASOS PCT: 1 %
Eosinophils Absolute: 0.2 10*3/uL (ref 0–0.7)
Eosinophils Relative: 2 %
HEMATOCRIT: 39.7 % (ref 35.0–47.0)
Hemoglobin: 12.8 g/dL (ref 12.0–16.0)
Lymphocytes Relative: 18 %
Lymphs Abs: 1.3 10*3/uL (ref 1.0–3.6)
MCH: 28.8 pg (ref 26.0–34.0)
MCHC: 32.2 g/dL (ref 32.0–36.0)
MCV: 89.5 fL (ref 80.0–100.0)
MONO ABS: 0.6 10*3/uL (ref 0.2–0.9)
Monocytes Relative: 9 %
NEUTROS ABS: 5.1 10*3/uL (ref 1.4–6.5)
Neutrophils Relative %: 70 %
PLATELETS: 238 10*3/uL (ref 150–440)
RBC: 4.44 MIL/uL (ref 3.80–5.20)
RDW: 14.1 % (ref 11.5–14.5)
WBC: 7.3 10*3/uL (ref 3.6–11.0)

## 2015-05-06 LAB — MAGNESIUM: Magnesium: 1.6 mg/dL — ABNORMAL LOW (ref 1.7–2.4)

## 2015-05-06 LAB — PHOSPHORUS: PHOSPHORUS: 3.7 mg/dL (ref 2.5–4.6)

## 2015-08-22 ENCOUNTER — Encounter: Payer: Self-pay | Admitting: Physician Assistant

## 2015-08-22 DIAGNOSIS — I340 Nonrheumatic mitral (valve) insufficiency: Secondary | ICD-10-CM | POA: Insufficient documentation

## 2015-08-23 ENCOUNTER — Ambulatory Visit: Payer: Self-pay | Admitting: Physician Assistant

## 2015-09-12 DIAGNOSIS — D0512 Intraductal carcinoma in situ of left breast: Secondary | ICD-10-CM | POA: Insufficient documentation

## 2015-09-23 ENCOUNTER — Telehealth: Payer: Self-pay | Admitting: Cardiovascular Disease

## 2015-09-23 NOTE — Telephone Encounter (Signed)
3 attempts to schedule fu from recall list.  Patient says she has had a lot going on with kidneys, and abnormal mammograms. She prefers to call back at a later time when able to be seen.  Deleting recall.

## 2015-09-24 HISTORY — PX: MASTECTOMY PARTIAL / LUMPECTOMY: SUR851

## 2015-10-25 HISTORY — PX: MASTECTOMY PARTIAL / LUMPECTOMY: SUR851

## 2015-11-22 ENCOUNTER — Ambulatory Visit: Payer: Commercial Managed Care - HMO | Admitting: Radiation Oncology

## 2015-11-22 ENCOUNTER — Ambulatory Visit
Admission: RE | Admit: 2015-11-22 | Payer: Commercial Managed Care - HMO | Source: Ambulatory Visit | Admitting: Radiation Oncology

## 2015-11-26 ENCOUNTER — Encounter: Payer: Self-pay | Admitting: Radiation Oncology

## 2015-11-26 ENCOUNTER — Ambulatory Visit
Admission: RE | Admit: 2015-11-26 | Discharge: 2015-11-26 | Disposition: A | Discharge status: Home / Self Care | Payer: Commercial Managed Care - HMO | Source: Ambulatory Visit | Attending: Radiation Oncology | Admitting: Radiation Oncology

## 2015-11-26 VITALS — BP 126/89 | HR 75 | Temp 98.0°F | Ht 66.0 in | Wt 225.3 lb

## 2015-11-26 DIAGNOSIS — N186 End stage renal disease: Secondary | ICD-10-CM | POA: Diagnosis not present

## 2015-11-26 DIAGNOSIS — Z510 Encounter for antineoplastic radiation therapy: Secondary | ICD-10-CM | POA: Insufficient documentation

## 2015-11-26 DIAGNOSIS — I499 Cardiac arrhythmia, unspecified: Secondary | ICD-10-CM | POA: Insufficient documentation

## 2015-11-26 DIAGNOSIS — Z8673 Personal history of transient ischemic attack (TIA), and cerebral infarction without residual deficits: Secondary | ICD-10-CM | POA: Insufficient documentation

## 2015-11-26 DIAGNOSIS — Z8614 Personal history of Methicillin resistant Staphylococcus aureus infection: Secondary | ICD-10-CM | POA: Diagnosis not present

## 2015-11-26 DIAGNOSIS — D0512 Intraductal carcinoma in situ of left breast: Secondary | ICD-10-CM

## 2015-11-26 DIAGNOSIS — Z940 Kidney transplant status: Secondary | ICD-10-CM | POA: Insufficient documentation

## 2015-11-26 DIAGNOSIS — Z79899 Other long term (current) drug therapy: Secondary | ICD-10-CM | POA: Diagnosis not present

## 2015-11-26 DIAGNOSIS — G40909 Epilepsy, unspecified, not intractable, without status epilepticus: Secondary | ICD-10-CM | POA: Insufficient documentation

## 2015-11-26 DIAGNOSIS — I129 Hypertensive chronic kidney disease with stage 1 through stage 4 chronic kidney disease, or unspecified chronic kidney disease: Secondary | ICD-10-CM | POA: Diagnosis not present

## 2015-11-26 DIAGNOSIS — Z170 Estrogen receptor positive status [ER+]: Secondary | ICD-10-CM | POA: Insufficient documentation

## 2015-11-26 DIAGNOSIS — I340 Nonrheumatic mitral (valve) insufficiency: Secondary | ICD-10-CM | POA: Insufficient documentation

## 2015-11-26 DIAGNOSIS — Z87891 Personal history of nicotine dependence: Secondary | ICD-10-CM | POA: Insufficient documentation

## 2015-11-26 DIAGNOSIS — G2581 Restless legs syndrome: Secondary | ICD-10-CM | POA: Insufficient documentation

## 2015-11-26 NOTE — Consult Note (Signed)
Except an outstanding is perfect of Radiation Oncology NEW PATIENT EVALUATION  Name: [name]  MRN: [id number]  Date:   11/26/2015     DOB: [birth year]   This 50 y.o. 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  REFERRING PHYSICIAN: True, Isabella Bowens, MD  CHIEF COMPLAINT:  Chief Complaint  Patient presents with  . Breast Cancer    initial evaluation of radiation therapy for breast cancer    DIAGNOSIS: The encounter diagnosis was DCIS (ductal carcinoma in situ) of breast, left.   PREVIOUS INVESTIGATIONS:  Mammograms ultrasound reviewed Pathology report reviewed Clinical notes reviewed  HPI: Patient is a 50 year old female who presented with an abnormal mammogram of her left breast showing pleomorphic calcifications in the upper outer quadrant initially measuring 1.3 cm on mammogram and ultrasound. She underwent a stereotactic guided biopsy of the left breast positive for ductal carcinoma in situ overall grade 2 ER/PR positive. There was comedonecrosis. On finger 21st she underwent a wide local excision incision circumferential around the nipple areolar complex for a grade 25.6 cm area of ductal carcinoma in situ. Medial deep margin was positive at the Inc. and she did underwent reexcision on 10/25/2015 showing no residual ductal carcinoma in situ. She has been consult at [facility] radiation oncology with recommendation for adjuvant radiation therapy. She is seen today closer to home and is doing well. She specifically denies breast tenderness cough or bone pain. She does have a history of end-stage renal disease status post renal transplant history of seizure disorder hypertension CVA in 2010 and history of mitral regurgitation.  PLANNED TREATMENT REGIMEN: Whole breast radiation  PAST MEDICAL HISTORY:  has a past medical history of End  stage renal disease ([place]); Hypertension; RLS (restless legs syndrome); Seizure disorder ([place]); History of methicillin resistant Staphylococcus aureus infection; Cerebrovascular accident ([place]) (2010); Clotted renal dialysis AV graft ([place]); Arrhythmia; Renal transplant, status post (03/24/2014); History of stress test; and Mitral regurgitation.    PAST SURGICAL HISTORY:  Past Surgical History  Procedure Laterality Date  . Insertion of dialysis catheter      x 2  . Kidney transplant  03/2014    right side.    FAMILY HISTORY: family history includes Heart attack in her father and mother; Hyperlipidemia in her father and mother; Hypertension in her father and mother.  SOCIAL HISTORY:  reports that she quit smoking about 7 years ago. She has never used smokeless tobacco. She reports that she uses illicit drugs ("Crack" cocaine and Marijuana). She reports that she does not drink alcohol.  ALLERGIES: Heparin; Ibuprofen; and Penicillins  MEDICATIONS:  Current Outpatient Prescriptions  Medication Sig Dispense Refill  . acetaminophen (TYLENOL) 500 MG tablet Take 500 mg by mouth every 6 (six) hours as needed.    Marland Kitchen aspirin EC 81 MG tablet Take 81 mg by mouth daily.    Marland Kitchen gabapentin (NEURONTIN) 300 MG capsule Take 3 capsules (300 mg) by mouth in the morning and at noon, 600 mg in the evening.    . levETIRAcetam (KEPPRA) 250 MG tablet Take 500 mg by mouth 2 (two) times daily.     Marland Kitchen NASONEX 50 MCG/ACT nasal spray Place 2 sprays into the nose daily.     Marland Kitchen omeprazole (PRILOSEC) 20 MG capsule Take 20 mg by mouth at bedtime.     Marland Kitchen oxyCODONE (OXY IR/ROXICODONE) 5  MG immediate release tablet Take 5 mg by mouth every 4 (four) hours as needed for severe pain.    . sodium bicarbonate 650 MG tablet Take 2 tablets (1300 mg) three times a day.    . tacrolimus (PROGRAF) 1 MG capsule 6mg  in the am and 6mg  in the pm, V42.0 kidney transplant, tx date 03/24/14, DAW1    . acetaminophen-codeine (TYLENOL #3) 300-30 MG per  tablet Take 1 tablet by mouth every 6 (six) hours as needed. Reported on 11/26/2015    . carvedilol (COREG) 12.5 MG tablet Take 12.5 mg by mouth 2 (two) times daily with a meal.     . clindamycin (CLEOCIN) 300 MG capsule Reported on 11/26/2015    . sodium bicarbonate 325 MG tablet Take 325 mg by mouth daily. Reported on 11/26/2015     No current facility-administered medications for this encounter.    ECOG PERFORMANCE STATUS:  0 - Asymptomatic  REVIEW OF SYSTEMS: At the present time Patient denies any weight loss, fatigue, weakness, fever, chills or night sweats. Patient denies any loss of vision, blurred vision. Patient denies any ringing  of the ears or hearing loss. No irregular heartbeat. Patient denies heart murmur or history of fainting. Patient denies any chest pain or pain radiating to her upper extremities. Patient denies any shortness of breath, difficulty breathing at night, cough or hemoptysis. Patient denies any swelling in the lower legs. Patient denies any nausea vomiting, vomiting of blood, or coffee ground material in the vomitus. Patient denies any stomach pain. Patient states has had normal bowel movements no significant constipation or diarrhea. Patient denies any dysuria, hematuria or significant nocturia. Patient denies any problems walking, swelling in the joints or loss of balance. Patient denies any skin changes, loss of hair or loss of weight. Patient denies any excessive worrying or anxiety or significant depression. Patient denies any problems with insomnia. Patient denies excessive thirst, polyuria, polydipsia. Patient denies any swollen glands, patient denies easy bruising or easy bleeding. Patient denies any recent infections, allergies or URI. Patient "s visual fields have not changed significantly in recent time. PHYSICAL EXAM: BP 126/89 mmHg  Pulse 75  Temp(Src) 98 F (36.7 C)  Ht 5\' 6"  (1.676 m)  Wt 225 lb 5 oz (102.2 kg)  BMI 36.38 kg/m2 Well-developed slightly  obese female in NAD breasts are large and pendulous. No dominant mass or nodularity is noted in either breast in 2 positions examined. She has a wide local excision scar circumferential around the nipple areolar complex on the left breast which is well-healed. She has approximately 2 and half centimeter nodule present in lumpectomy cavity consistent with hematoma. No other dominant mass or nodularity is noted in either breast in 2 positions examined. No axillary or supraclavicular adenopathy is appreciated. Well-developed well-nourished patient in NAD. HEENT reveals PERLA, EOMI, discs not visualized.  Oral cavity is clear. No oral mucosal lesions are identified. Neck is clear without evidence of cervical or supraclavicular adenopathy. Lungs are clear to A&P. Cardiac examination is essentially unremarkable with regular rate and rhythm without murmur rub or thrill. Abdomen is benign with no organomegaly or masses noted. Motor sensory and DTR levels are equal and symmetric in the upper and lower extremities. Cranial nerves II through XII are grossly intact. Proprioception is intact. No peripheral adenopathy or edema is identified. No motor or sensory levels are noted. Crude visual fields are within normal range.  LABORATORY DATA: Mammograms ultrasound are reviewed    RADIOLOGY RESULTS: Pathology report reviewed  IMPRESSION: Stage 0 ductal carcinoma in situ ER/PR positive status post wide local excision of the left breast in 50 year old female  PLAN: Based the patient's large breast size would not be an ideal candidate for hyperfractionated treatment. Would plan on delivering 5000 cGy over 5 weeks. Would also like to boost her scar another 1600 cGy using electron beam based on the questionable positive margin status post reexcision. Patient also may be a candidate for tamoxifen therapy after completion of radiation with all of her other comorbidities will let medical oncology at [facility] decide that.  Risks and benefits of radiation including skin reaction which may be exacerbated based on the large breast size, fatigue, alteration of blood counts, inclusion of some superficial lung all were discussed in detail with the patient. I have personally set up and ordered CT simulation later this week.  I would like to take this opportunity for allowing me to participate in the care of your patient.Armstead Peaks., MD

## 2015-11-26 NOTE — Progress Notes (Signed)
  Oncology Nurse Navigator Documentation  Navigator Location: [facility] (11/26/15 1100) Navigator Encounter Type: Initial RadOnc (11/26/15 1100)   Abnormal Finding Date: 09/12/15 (11/26/15 1100) Confirmed Diagnosis Date: 09/18/15 (11/26/15 1100) Surgery Date: 09/24/15 (11/26/15 1100) Treatment Initiated Date: 09/24/15 (11/26/15 1100) Patient Visit Type: RadOnc (11/26/15 1100) Treatment Phase: Pre-Tx/Tx Discussion (11/26/15 1100) Barriers/Navigation Needs: Transportation;Education (11/26/15 1100) Education: Transport During Treatment;Preparing for Upcoming Surgery/ Treatment (11/26/15 1100) Interventions: Coordination of Care (11/26/15 1100)            Acuity: Level 1 (11/26/15 1100) Acuity Level 1: Initial guidance, education and coordination as needed;Minimal follow up required (11/26/15 1100)       Time Spent with Patient: 30 (11/26/15 1100)

## 2015-11-28 ENCOUNTER — Ambulatory Visit
Admission: RE | Admit: 2015-11-28 | Discharge: 2015-11-28 | Disposition: A | Discharge status: Home / Self Care | Payer: Commercial Managed Care - HMO | Source: Ambulatory Visit | Attending: Radiation Oncology | Admitting: Radiation Oncology

## 2015-11-28 DIAGNOSIS — Z510 Encounter for antineoplastic radiation therapy: Secondary | ICD-10-CM | POA: Diagnosis not present

## 2015-11-29 ENCOUNTER — Other Ambulatory Visit: Payer: Self-pay | Admitting: *Deleted

## 2015-11-29 DIAGNOSIS — D0512 Intraductal carcinoma in situ of left breast: Secondary | ICD-10-CM

## 2015-12-03 DIAGNOSIS — Z170 Estrogen receptor positive status [ER+]: Secondary | ICD-10-CM | POA: Diagnosis not present

## 2015-12-03 DIAGNOSIS — Z8614 Personal history of Methicillin resistant Staphylococcus aureus infection: Secondary | ICD-10-CM | POA: Diagnosis not present

## 2015-12-03 DIAGNOSIS — I340 Nonrheumatic mitral (valve) insufficiency: Secondary | ICD-10-CM | POA: Diagnosis not present

## 2015-12-03 DIAGNOSIS — Z8673 Personal history of transient ischemic attack (TIA), and cerebral infarction without residual deficits: Secondary | ICD-10-CM | POA: Diagnosis not present

## 2015-12-03 DIAGNOSIS — N186 End stage renal disease: Secondary | ICD-10-CM | POA: Diagnosis not present

## 2015-12-03 DIAGNOSIS — G2581 Restless legs syndrome: Secondary | ICD-10-CM | POA: Diagnosis not present

## 2015-12-03 DIAGNOSIS — I499 Cardiac arrhythmia, unspecified: Secondary | ICD-10-CM | POA: Diagnosis not present

## 2015-12-03 DIAGNOSIS — D0512 Intraductal carcinoma in situ of left breast: Secondary | ICD-10-CM | POA: Diagnosis not present

## 2015-12-03 DIAGNOSIS — G40909 Epilepsy, unspecified, not intractable, without status epilepticus: Secondary | ICD-10-CM | POA: Diagnosis not present

## 2015-12-03 DIAGNOSIS — Z940 Kidney transplant status: Secondary | ICD-10-CM | POA: Diagnosis not present

## 2015-12-03 DIAGNOSIS — I129 Hypertensive chronic kidney disease with stage 1 through stage 4 chronic kidney disease, or unspecified chronic kidney disease: Secondary | ICD-10-CM | POA: Diagnosis not present

## 2015-12-03 DIAGNOSIS — Z87891 Personal history of nicotine dependence: Secondary | ICD-10-CM | POA: Diagnosis not present

## 2015-12-03 DIAGNOSIS — Z79899 Other long term (current) drug therapy: Secondary | ICD-10-CM | POA: Diagnosis not present

## 2015-12-03 DIAGNOSIS — Z510 Encounter for antineoplastic radiation therapy: Secondary | ICD-10-CM | POA: Diagnosis present

## 2015-12-05 ENCOUNTER — Ambulatory Visit
Admission: RE | Admit: 2015-12-05 | Discharge: 2015-12-05 | Disposition: A | Discharge status: Home / Self Care | Payer: Commercial Managed Care - HMO | Source: Ambulatory Visit | Attending: Radiation Oncology | Admitting: Radiation Oncology

## 2015-12-05 DIAGNOSIS — Z510 Encounter for antineoplastic radiation therapy: Secondary | ICD-10-CM | POA: Diagnosis not present

## 2015-12-09 ENCOUNTER — Ambulatory Visit
Admission: RE | Admit: 2015-12-09 | Discharge: 2015-12-09 | Disposition: A | Discharge status: Home / Self Care | Payer: Commercial Managed Care - HMO | Source: Ambulatory Visit | Attending: Radiation Oncology | Admitting: Radiation Oncology

## 2015-12-09 ENCOUNTER — Inpatient Hospital Stay: Payer: Medicare Other | Attending: Nephrology

## 2015-12-09 DIAGNOSIS — Z510 Encounter for antineoplastic radiation therapy: Secondary | ICD-10-CM | POA: Diagnosis not present

## 2015-12-10 ENCOUNTER — Inpatient Hospital Stay: Payer: Medicare Other

## 2015-12-10 ENCOUNTER — Ambulatory Visit
Admission: RE | Admit: 2015-12-10 | Discharge: 2015-12-10 | Disposition: A | Discharge status: Home / Self Care | Payer: Commercial Managed Care - HMO | Source: Ambulatory Visit | Attending: Radiation Oncology | Admitting: Radiation Oncology

## 2015-12-10 DIAGNOSIS — Z510 Encounter for antineoplastic radiation therapy: Secondary | ICD-10-CM | POA: Diagnosis not present

## 2015-12-11 ENCOUNTER — Ambulatory Visit
Admission: RE | Admit: 2015-12-11 | Discharge: 2015-12-11 | Disposition: A | Discharge status: Home / Self Care | Payer: Commercial Managed Care - HMO | Source: Ambulatory Visit | Attending: Radiation Oncology | Admitting: Radiation Oncology

## 2015-12-11 ENCOUNTER — Inpatient Hospital Stay: Payer: Medicare Other

## 2015-12-11 DIAGNOSIS — Z510 Encounter for antineoplastic radiation therapy: Secondary | ICD-10-CM | POA: Diagnosis not present

## 2015-12-12 ENCOUNTER — Inpatient Hospital Stay: Payer: Medicare Other

## 2015-12-12 ENCOUNTER — Ambulatory Visit
Admission: RE | Admit: 2015-12-12 | Discharge: 2015-12-12 | Disposition: A | Discharge status: Home / Self Care | Payer: Commercial Managed Care - HMO | Source: Ambulatory Visit | Attending: Radiation Oncology | Admitting: Radiation Oncology

## 2015-12-12 DIAGNOSIS — Z510 Encounter for antineoplastic radiation therapy: Secondary | ICD-10-CM | POA: Diagnosis not present

## 2015-12-13 ENCOUNTER — Inpatient Hospital Stay: Payer: Medicare Other

## 2015-12-13 ENCOUNTER — Ambulatory Visit
Admission: RE | Admit: 2015-12-13 | Discharge: 2015-12-13 | Disposition: A | Discharge status: Home / Self Care | Payer: Commercial Managed Care - HMO | Source: Ambulatory Visit | Attending: Radiation Oncology | Admitting: Radiation Oncology

## 2015-12-13 DIAGNOSIS — Z510 Encounter for antineoplastic radiation therapy: Secondary | ICD-10-CM | POA: Diagnosis not present

## 2015-12-16 ENCOUNTER — Inpatient Hospital Stay: Payer: Medicare Other

## 2015-12-16 ENCOUNTER — Ambulatory Visit
Admission: RE | Admit: 2015-12-16 | Discharge: 2015-12-16 | Disposition: A | Discharge status: Home / Self Care | Payer: Commercial Managed Care - HMO | Source: Ambulatory Visit | Attending: Radiation Oncology | Admitting: Radiation Oncology

## 2015-12-16 DIAGNOSIS — Z510 Encounter for antineoplastic radiation therapy: Secondary | ICD-10-CM | POA: Diagnosis not present

## 2015-12-17 ENCOUNTER — Inpatient Hospital Stay: Payer: Medicare Other

## 2015-12-17 ENCOUNTER — Ambulatory Visit
Admission: RE | Admit: 2015-12-17 | Discharge: 2015-12-17 | Disposition: A | Discharge status: Home / Self Care | Payer: Commercial Managed Care - HMO | Source: Ambulatory Visit | Attending: Radiation Oncology | Admitting: Radiation Oncology

## 2015-12-17 DIAGNOSIS — Z510 Encounter for antineoplastic radiation therapy: Secondary | ICD-10-CM | POA: Diagnosis not present

## 2015-12-18 ENCOUNTER — Inpatient Hospital Stay: Payer: Medicare Other

## 2015-12-18 ENCOUNTER — Ambulatory Visit
Admission: RE | Admit: 2015-12-18 | Discharge: 2015-12-18 | Disposition: A | Discharge status: Home / Self Care | Payer: Commercial Managed Care - HMO | Source: Ambulatory Visit | Attending: Radiation Oncology | Admitting: Radiation Oncology

## 2015-12-18 DIAGNOSIS — Z510 Encounter for antineoplastic radiation therapy: Secondary | ICD-10-CM | POA: Diagnosis not present

## 2015-12-19 ENCOUNTER — Ambulatory Visit
Admission: RE | Admit: 2015-12-19 | Discharge: 2015-12-19 | Disposition: A | Discharge status: Home / Self Care | Payer: Commercial Managed Care - HMO | Source: Ambulatory Visit | Attending: Radiation Oncology | Admitting: Radiation Oncology

## 2015-12-19 ENCOUNTER — Inpatient Hospital Stay: Payer: Medicare Other

## 2015-12-19 DIAGNOSIS — Z510 Encounter for antineoplastic radiation therapy: Secondary | ICD-10-CM | POA: Diagnosis not present

## 2015-12-20 ENCOUNTER — Inpatient Hospital Stay: Payer: Medicare Other

## 2015-12-20 ENCOUNTER — Ambulatory Visit
Admission: RE | Admit: 2015-12-20 | Discharge: 2015-12-20 | Disposition: A | Discharge status: Home / Self Care | Payer: Commercial Managed Care - HMO | Source: Ambulatory Visit | Attending: Radiation Oncology | Admitting: Radiation Oncology

## 2015-12-20 DIAGNOSIS — Z510 Encounter for antineoplastic radiation therapy: Secondary | ICD-10-CM | POA: Diagnosis not present

## 2015-12-23 ENCOUNTER — Inpatient Hospital Stay: Payer: Medicare Other

## 2015-12-23 ENCOUNTER — Ambulatory Visit
Admission: RE | Admit: 2015-12-23 | Discharge: 2015-12-23 | Disposition: A | Discharge status: Home / Self Care | Payer: Commercial Managed Care - HMO | Source: Ambulatory Visit | Attending: Radiation Oncology | Admitting: Radiation Oncology

## 2015-12-23 DIAGNOSIS — Z510 Encounter for antineoplastic radiation therapy: Secondary | ICD-10-CM | POA: Diagnosis not present

## 2015-12-24 ENCOUNTER — Ambulatory Visit
Admission: RE | Admit: 2015-12-24 | Discharge: 2015-12-24 | Disposition: A | Discharge status: Home / Self Care | Payer: Commercial Managed Care - HMO | Source: Ambulatory Visit | Attending: Radiation Oncology | Admitting: Radiation Oncology

## 2015-12-24 ENCOUNTER — Inpatient Hospital Stay: Payer: Medicare Other

## 2015-12-24 ENCOUNTER — Other Ambulatory Visit
Admission: RE | Admit: 2015-12-24 | Discharge: 2015-12-24 | Disposition: A | Discharge status: Home / Self Care | Payer: Medicare Other | Source: Ambulatory Visit | Attending: Radiation Oncology | Admitting: Radiation Oncology

## 2015-12-24 DIAGNOSIS — D0512 Intraductal carcinoma in situ of left breast: Secondary | ICD-10-CM | POA: Diagnosis present

## 2015-12-24 DIAGNOSIS — Z510 Encounter for antineoplastic radiation therapy: Secondary | ICD-10-CM | POA: Diagnosis not present

## 2015-12-24 LAB — CBC
HEMATOCRIT: 38.1 % (ref 35.0–47.0)
HEMOGLOBIN: 12.5 g/dL (ref 12.0–16.0)
MCH: 28.2 pg (ref 26.0–34.0)
MCHC: 32.8 g/dL (ref 32.0–36.0)
MCV: 85.9 fL (ref 80.0–100.0)
Platelets: 236 10*3/uL (ref 150–440)
RBC: 4.44 MIL/uL (ref 3.80–5.20)
RDW: 14.9 % — ABNORMAL HIGH (ref 11.5–14.5)
WBC: 7.7 10*3/uL (ref 3.6–11.0)

## 2015-12-25 ENCOUNTER — Ambulatory Visit
Admission: RE | Admit: 2015-12-25 | Discharge: 2015-12-25 | Disposition: A | Discharge status: Home / Self Care | Payer: Commercial Managed Care - HMO | Source: Ambulatory Visit | Attending: Radiation Oncology | Admitting: Radiation Oncology

## 2015-12-25 ENCOUNTER — Inpatient Hospital Stay: Payer: Medicare Other

## 2015-12-25 DIAGNOSIS — Z510 Encounter for antineoplastic radiation therapy: Secondary | ICD-10-CM | POA: Diagnosis not present

## 2015-12-26 ENCOUNTER — Other Ambulatory Visit: Payer: Self-pay | Admitting: *Deleted

## 2015-12-26 ENCOUNTER — Inpatient Hospital Stay: Payer: Medicare Other

## 2015-12-26 ENCOUNTER — Ambulatory Visit
Admission: RE | Admit: 2015-12-26 | Discharge: 2015-12-26 | Disposition: A | Discharge status: Home / Self Care | Payer: Commercial Managed Care - HMO | Source: Ambulatory Visit | Attending: Radiation Oncology | Admitting: Radiation Oncology

## 2015-12-26 DIAGNOSIS — Z510 Encounter for antineoplastic radiation therapy: Secondary | ICD-10-CM | POA: Diagnosis not present

## 2015-12-26 DIAGNOSIS — D0512 Intraductal carcinoma in situ of left breast: Secondary | ICD-10-CM

## 2015-12-26 MED ORDER — PROMETHAZINE HCL 25 MG PO TABS
25.0000 mg | ORAL_TABLET | Freq: Four times a day (QID) | ORAL | Status: DC | PRN
Start: 2015-12-26 — End: 2016-08-09

## 2015-12-27 ENCOUNTER — Inpatient Hospital Stay: Payer: Medicare Other

## 2015-12-27 ENCOUNTER — Ambulatory Visit
Admission: RE | Admit: 2015-12-27 | Discharge: 2015-12-27 | Disposition: A | Discharge status: Home / Self Care | Payer: Commercial Managed Care - HMO | Source: Ambulatory Visit | Attending: Radiation Oncology | Admitting: Radiation Oncology

## 2015-12-27 DIAGNOSIS — Z510 Encounter for antineoplastic radiation therapy: Secondary | ICD-10-CM | POA: Diagnosis not present

## 2015-12-30 ENCOUNTER — Inpatient Hospital Stay: Payer: Medicare Other

## 2015-12-31 ENCOUNTER — Ambulatory Visit
Admission: RE | Admit: 2015-12-31 | Discharge: 2015-12-31 | Disposition: A | Discharge status: Home / Self Care | Payer: Commercial Managed Care - HMO | Source: Ambulatory Visit | Attending: Radiation Oncology | Admitting: Radiation Oncology

## 2015-12-31 ENCOUNTER — Inpatient Hospital Stay: Payer: Medicare Other

## 2015-12-31 DIAGNOSIS — Z510 Encounter for antineoplastic radiation therapy: Secondary | ICD-10-CM | POA: Diagnosis not present

## 2016-01-01 ENCOUNTER — Inpatient Hospital Stay: Payer: Medicare Other

## 2016-01-01 ENCOUNTER — Ambulatory Visit
Admission: RE | Admit: 2016-01-01 | Discharge: 2016-01-01 | Disposition: A | Discharge status: Home / Self Care | Payer: Commercial Managed Care - HMO | Source: Ambulatory Visit | Attending: Radiation Oncology | Admitting: Radiation Oncology

## 2016-01-01 DIAGNOSIS — Z510 Encounter for antineoplastic radiation therapy: Secondary | ICD-10-CM | POA: Diagnosis not present

## 2016-01-02 ENCOUNTER — Ambulatory Visit
Admission: RE | Admit: 2016-01-02 | Discharge: 2016-01-02 | Disposition: A | Discharge status: Home / Self Care | Payer: Commercial Managed Care - HMO | Source: Ambulatory Visit | Attending: Radiation Oncology | Admitting: Radiation Oncology

## 2016-01-02 ENCOUNTER — Inpatient Hospital Stay: Payer: Medicare Other | Attending: Nephrology

## 2016-01-02 ENCOUNTER — Inpatient Hospital Stay: Payer: Medicare Other

## 2016-01-02 DIAGNOSIS — Z510 Encounter for antineoplastic radiation therapy: Secondary | ICD-10-CM | POA: Diagnosis not present

## 2016-01-03 ENCOUNTER — Inpatient Hospital Stay: Payer: Medicare Other

## 2016-01-03 ENCOUNTER — Ambulatory Visit
Admission: RE | Admit: 2016-01-03 | Discharge: 2016-01-03 | Disposition: A | Discharge status: Home / Self Care | Payer: Commercial Managed Care - HMO | Source: Ambulatory Visit | Attending: Radiation Oncology | Admitting: Radiation Oncology

## 2016-01-03 DIAGNOSIS — Z510 Encounter for antineoplastic radiation therapy: Secondary | ICD-10-CM | POA: Diagnosis not present

## 2016-01-06 ENCOUNTER — Inpatient Hospital Stay: Payer: Medicare Other

## 2016-01-06 ENCOUNTER — Other Ambulatory Visit: Payer: Self-pay | Admitting: *Deleted

## 2016-01-06 ENCOUNTER — Ambulatory Visit
Admission: RE | Admit: 2016-01-06 | Discharge: 2016-01-06 | Disposition: A | Discharge status: Home / Self Care | Payer: Commercial Managed Care - HMO | Source: Ambulatory Visit | Attending: Radiation Oncology | Admitting: Radiation Oncology

## 2016-01-06 ENCOUNTER — Ambulatory Visit: Admission: RE | Admit: 2016-01-06 | Payer: Commercial Managed Care - HMO | Source: Ambulatory Visit

## 2016-01-06 DIAGNOSIS — D0512 Intraductal carcinoma in situ of left breast: Secondary | ICD-10-CM

## 2016-01-06 MED ORDER — SILVER SULFADIAZINE 1 % EX CREA: 1.0000 "application " | TOPICAL_CREAM | Freq: Two times a day (BID) | CUTANEOUS | Status: DC

## 2016-01-07 ENCOUNTER — Ambulatory Visit
Admission: RE | Admit: 2016-01-07 | Discharge: 2016-01-07 | Disposition: A | Discharge status: Home / Self Care | Payer: Commercial Managed Care - HMO | Source: Ambulatory Visit | Attending: Radiation Oncology | Admitting: Radiation Oncology

## 2016-01-07 ENCOUNTER — Ambulatory Visit: Payer: Commercial Managed Care - HMO

## 2016-01-07 ENCOUNTER — Inpatient Hospital Stay: Payer: Medicare Other

## 2016-01-08 ENCOUNTER — Ambulatory Visit: Payer: Commercial Managed Care - HMO

## 2016-01-08 ENCOUNTER — Inpatient Hospital Stay: Payer: Medicare Other

## 2016-01-08 ENCOUNTER — Ambulatory Visit
Admission: RE | Admit: 2016-01-08 | Discharge: 2016-01-08 | Disposition: A | Discharge status: Home / Self Care | Payer: Commercial Managed Care - HMO | Source: Ambulatory Visit | Attending: Radiation Oncology | Admitting: Radiation Oncology

## 2016-01-09 ENCOUNTER — Inpatient Hospital Stay: Payer: Medicare Other

## 2016-01-09 ENCOUNTER — Ambulatory Visit
Admission: RE | Admit: 2016-01-09 | Discharge: 2016-01-09 | Disposition: A | Discharge status: Home / Self Care | Payer: Commercial Managed Care - HMO | Source: Ambulatory Visit | Attending: Radiation Oncology | Admitting: Radiation Oncology

## 2016-01-09 ENCOUNTER — Ambulatory Visit: Payer: Commercial Managed Care - HMO

## 2016-01-10 ENCOUNTER — Ambulatory Visit
Admission: RE | Admit: 2016-01-10 | Discharge: 2016-01-10 | Disposition: A | Discharge status: Home / Self Care | Payer: Commercial Managed Care - HMO | Source: Ambulatory Visit | Attending: Radiation Oncology | Admitting: Radiation Oncology

## 2016-01-10 ENCOUNTER — Inpatient Hospital Stay: Payer: Medicare Other

## 2016-01-10 ENCOUNTER — Ambulatory Visit: Payer: Commercial Managed Care - HMO

## 2016-01-13 ENCOUNTER — Inpatient Hospital Stay: Payer: Medicare Other

## 2016-01-13 ENCOUNTER — Ambulatory Visit: Admission: RE | Admit: 2016-01-13 | Payer: Commercial Managed Care - HMO | Source: Ambulatory Visit

## 2016-01-13 ENCOUNTER — Ambulatory Visit
Admission: RE | Admit: 2016-01-13 | Discharge: 2016-01-13 | Disposition: A | Discharge status: Home / Self Care | Payer: Commercial Managed Care - HMO | Source: Ambulatory Visit | Attending: Radiation Oncology | Admitting: Radiation Oncology

## 2016-01-13 DIAGNOSIS — Z510 Encounter for antineoplastic radiation therapy: Secondary | ICD-10-CM | POA: Diagnosis not present

## 2016-01-14 ENCOUNTER — Ambulatory Visit: Payer: Commercial Managed Care - HMO

## 2016-01-14 ENCOUNTER — Inpatient Hospital Stay: Payer: Medicare Other

## 2016-01-14 ENCOUNTER — Ambulatory Visit
Admission: RE | Admit: 2016-01-14 | Discharge: 2016-01-14 | Disposition: A | Discharge status: Home / Self Care | Payer: Commercial Managed Care - HMO | Source: Ambulatory Visit | Attending: Radiation Oncology | Admitting: Radiation Oncology

## 2016-01-14 DIAGNOSIS — Z510 Encounter for antineoplastic radiation therapy: Secondary | ICD-10-CM | POA: Diagnosis not present

## 2016-01-15 ENCOUNTER — Inpatient Hospital Stay: Payer: Medicare Other

## 2016-01-15 ENCOUNTER — Ambulatory Visit: Payer: Commercial Managed Care - HMO

## 2016-01-15 ENCOUNTER — Ambulatory Visit
Admission: RE | Admit: 2016-01-15 | Discharge: 2016-01-15 | Disposition: A | Discharge status: Home / Self Care | Payer: Commercial Managed Care - HMO | Source: Ambulatory Visit | Attending: Radiation Oncology | Admitting: Radiation Oncology

## 2016-01-15 DIAGNOSIS — Z510 Encounter for antineoplastic radiation therapy: Secondary | ICD-10-CM | POA: Diagnosis not present

## 2016-01-16 ENCOUNTER — Inpatient Hospital Stay: Payer: Medicare Other

## 2016-01-16 ENCOUNTER — Ambulatory Visit
Admission: RE | Admit: 2016-01-16 | Discharge: 2016-01-16 | Disposition: A | Discharge status: Home / Self Care | Payer: Commercial Managed Care - HMO | Source: Ambulatory Visit | Attending: Radiation Oncology | Admitting: Radiation Oncology

## 2016-01-16 ENCOUNTER — Ambulatory Visit: Payer: Commercial Managed Care - HMO

## 2016-01-16 DIAGNOSIS — Z510 Encounter for antineoplastic radiation therapy: Secondary | ICD-10-CM | POA: Diagnosis not present

## 2016-01-17 ENCOUNTER — Ambulatory Visit
Admission: RE | Admit: 2016-01-17 | Discharge: 2016-01-17 | Disposition: A | Discharge status: Home / Self Care | Payer: Commercial Managed Care - HMO | Source: Ambulatory Visit | Attending: Radiation Oncology | Admitting: Radiation Oncology

## 2016-01-17 ENCOUNTER — Inpatient Hospital Stay: Payer: Medicare Other

## 2016-01-17 ENCOUNTER — Ambulatory Visit: Payer: Commercial Managed Care - HMO

## 2016-01-17 DIAGNOSIS — Z510 Encounter for antineoplastic radiation therapy: Secondary | ICD-10-CM | POA: Diagnosis not present

## 2016-01-20 ENCOUNTER — Inpatient Hospital Stay: Payer: Medicare Other

## 2016-01-20 ENCOUNTER — Ambulatory Visit
Admission: RE | Admit: 2016-01-20 | Discharge: 2016-01-20 | Disposition: A | Discharge status: Home / Self Care | Payer: Commercial Managed Care - HMO | Source: Ambulatory Visit | Attending: Radiation Oncology | Admitting: Radiation Oncology

## 2016-01-20 ENCOUNTER — Ambulatory Visit: Payer: Commercial Managed Care - HMO

## 2016-01-20 DIAGNOSIS — Z510 Encounter for antineoplastic radiation therapy: Secondary | ICD-10-CM | POA: Diagnosis not present

## 2016-01-21 ENCOUNTER — Ambulatory Visit: Payer: Commercial Managed Care - HMO

## 2016-01-21 ENCOUNTER — Inpatient Hospital Stay: Payer: Medicare Other

## 2016-01-21 ENCOUNTER — Ambulatory Visit
Admission: RE | Admit: 2016-01-21 | Discharge: 2016-01-21 | Disposition: A | Discharge status: Home / Self Care | Payer: Commercial Managed Care - HMO | Source: Ambulatory Visit | Attending: Radiation Oncology | Admitting: Radiation Oncology

## 2016-01-21 DIAGNOSIS — Z510 Encounter for antineoplastic radiation therapy: Secondary | ICD-10-CM | POA: Diagnosis not present

## 2016-01-22 ENCOUNTER — Ambulatory Visit: Payer: Commercial Managed Care - HMO

## 2016-01-22 ENCOUNTER — Ambulatory Visit
Admission: RE | Admit: 2016-01-22 | Discharge: 2016-01-22 | Disposition: A | Discharge status: Home / Self Care | Payer: Commercial Managed Care - HMO | Source: Ambulatory Visit | Attending: Radiation Oncology | Admitting: Radiation Oncology

## 2016-01-22 ENCOUNTER — Inpatient Hospital Stay: Payer: Medicare Other

## 2016-01-22 DIAGNOSIS — Z510 Encounter for antineoplastic radiation therapy: Secondary | ICD-10-CM | POA: Diagnosis not present

## 2016-01-23 ENCOUNTER — Ambulatory Visit: Payer: Commercial Managed Care - HMO

## 2016-01-23 ENCOUNTER — Inpatient Hospital Stay: Payer: Medicare Other

## 2016-01-23 ENCOUNTER — Ambulatory Visit
Admission: RE | Admit: 2016-01-23 | Discharge: 2016-01-23 | Disposition: A | Discharge status: Home / Self Care | Payer: Commercial Managed Care - HMO | Source: Ambulatory Visit | Attending: Radiation Oncology | Admitting: Radiation Oncology

## 2016-01-23 DIAGNOSIS — Z510 Encounter for antineoplastic radiation therapy: Secondary | ICD-10-CM | POA: Diagnosis not present

## 2016-01-24 ENCOUNTER — Inpatient Hospital Stay: Payer: Medicare Other

## 2016-01-24 ENCOUNTER — Ambulatory Visit
Admission: RE | Admit: 2016-01-24 | Discharge: 2016-01-24 | Disposition: A | Discharge status: Home / Self Care | Payer: Commercial Managed Care - HMO | Source: Ambulatory Visit | Attending: Radiation Oncology | Admitting: Radiation Oncology

## 2016-01-24 ENCOUNTER — Ambulatory Visit: Payer: Commercial Managed Care - HMO

## 2016-01-24 DIAGNOSIS — Z510 Encounter for antineoplastic radiation therapy: Secondary | ICD-10-CM | POA: Diagnosis not present

## 2016-01-27 ENCOUNTER — Ambulatory Visit
Admission: RE | Admit: 2016-01-27 | Discharge: 2016-01-27 | Disposition: A | Discharge status: Home / Self Care | Payer: Commercial Managed Care - HMO | Source: Ambulatory Visit | Attending: Radiation Oncology | Admitting: Radiation Oncology

## 2016-01-27 ENCOUNTER — Ambulatory Visit: Payer: Commercial Managed Care - HMO

## 2016-01-27 ENCOUNTER — Inpatient Hospital Stay: Payer: Medicare Other

## 2016-01-27 DIAGNOSIS — Z510 Encounter for antineoplastic radiation therapy: Secondary | ICD-10-CM | POA: Diagnosis not present

## 2016-01-28 ENCOUNTER — Ambulatory Visit: Payer: Commercial Managed Care - HMO

## 2016-01-28 ENCOUNTER — Ambulatory Visit
Admission: RE | Admit: 2016-01-28 | Discharge: 2016-01-28 | Disposition: A | Discharge status: Home / Self Care | Payer: Commercial Managed Care - HMO | Source: Ambulatory Visit | Attending: Radiation Oncology | Admitting: Radiation Oncology

## 2016-01-28 DIAGNOSIS — Z510 Encounter for antineoplastic radiation therapy: Secondary | ICD-10-CM | POA: Diagnosis not present

## 2016-01-29 ENCOUNTER — Ambulatory Visit: Payer: Commercial Managed Care - HMO

## 2016-01-29 ENCOUNTER — Ambulatory Visit
Admission: RE | Admit: 2016-01-29 | Discharge: 2016-01-29 | Disposition: A | Discharge status: Home / Self Care | Payer: Commercial Managed Care - HMO | Source: Ambulatory Visit | Attending: Radiation Oncology | Admitting: Radiation Oncology

## 2016-01-29 DIAGNOSIS — Z510 Encounter for antineoplastic radiation therapy: Secondary | ICD-10-CM | POA: Diagnosis not present

## 2016-01-30 ENCOUNTER — Ambulatory Visit: Payer: Commercial Managed Care - HMO

## 2016-01-30 ENCOUNTER — Ambulatory Visit
Admission: RE | Admit: 2016-01-30 | Discharge: 2016-01-30 | Disposition: A | Discharge status: Home / Self Care | Payer: Commercial Managed Care - HMO | Source: Ambulatory Visit | Attending: Radiation Oncology | Admitting: Radiation Oncology

## 2016-01-30 DIAGNOSIS — Z510 Encounter for antineoplastic radiation therapy: Secondary | ICD-10-CM | POA: Diagnosis not present

## 2016-01-31 ENCOUNTER — Ambulatory Visit
Admission: RE | Admit: 2016-01-31 | Discharge: 2016-01-31 | Disposition: A | Discharge status: Home / Self Care | Payer: Commercial Managed Care - HMO | Source: Ambulatory Visit | Attending: Radiation Oncology | Admitting: Radiation Oncology

## 2016-01-31 ENCOUNTER — Ambulatory Visit: Payer: Commercial Managed Care - HMO

## 2016-01-31 DIAGNOSIS — Z510 Encounter for antineoplastic radiation therapy: Secondary | ICD-10-CM | POA: Diagnosis not present

## 2016-02-01 DIAGNOSIS — G40909 Epilepsy, unspecified, not intractable, without status epilepticus: Secondary | ICD-10-CM | POA: Diagnosis not present

## 2016-02-01 DIAGNOSIS — Z79899 Other long term (current) drug therapy: Secondary | ICD-10-CM | POA: Diagnosis not present

## 2016-02-01 DIAGNOSIS — Z8673 Personal history of transient ischemic attack (TIA), and cerebral infarction without residual deficits: Secondary | ICD-10-CM | POA: Diagnosis not present

## 2016-02-01 DIAGNOSIS — Z87891 Personal history of nicotine dependence: Secondary | ICD-10-CM | POA: Diagnosis not present

## 2016-02-01 DIAGNOSIS — Z8614 Personal history of Methicillin resistant Staphylococcus aureus infection: Secondary | ICD-10-CM | POA: Diagnosis not present

## 2016-02-01 DIAGNOSIS — Z940 Kidney transplant status: Secondary | ICD-10-CM | POA: Diagnosis not present

## 2016-02-01 DIAGNOSIS — I499 Cardiac arrhythmia, unspecified: Secondary | ICD-10-CM | POA: Diagnosis not present

## 2016-02-01 DIAGNOSIS — Z170 Estrogen receptor positive status [ER+]: Secondary | ICD-10-CM | POA: Diagnosis not present

## 2016-02-01 DIAGNOSIS — Z510 Encounter for antineoplastic radiation therapy: Secondary | ICD-10-CM | POA: Diagnosis present

## 2016-02-01 DIAGNOSIS — D0512 Intraductal carcinoma in situ of left breast: Secondary | ICD-10-CM | POA: Diagnosis not present

## 2016-02-01 DIAGNOSIS — N186 End stage renal disease: Secondary | ICD-10-CM | POA: Diagnosis not present

## 2016-02-01 DIAGNOSIS — I129 Hypertensive chronic kidney disease with stage 1 through stage 4 chronic kidney disease, or unspecified chronic kidney disease: Secondary | ICD-10-CM | POA: Diagnosis not present

## 2016-02-01 DIAGNOSIS — G2581 Restless legs syndrome: Secondary | ICD-10-CM | POA: Diagnosis not present

## 2016-02-01 DIAGNOSIS — I340 Nonrheumatic mitral (valve) insufficiency: Secondary | ICD-10-CM | POA: Diagnosis not present

## 2016-02-03 ENCOUNTER — Ambulatory Visit
Admission: RE | Admit: 2016-02-03 | Discharge: 2016-02-03 | Disposition: A | Discharge status: Home / Self Care | Payer: Commercial Managed Care - HMO | Source: Ambulatory Visit | Attending: Radiation Oncology | Admitting: Radiation Oncology

## 2016-02-03 ENCOUNTER — Ambulatory Visit: Payer: Commercial Managed Care - HMO

## 2016-02-03 DIAGNOSIS — Z79899 Other long term (current) drug therapy: Secondary | ICD-10-CM | POA: Diagnosis not present

## 2016-02-03 DIAGNOSIS — I499 Cardiac arrhythmia, unspecified: Secondary | ICD-10-CM | POA: Diagnosis not present

## 2016-02-03 DIAGNOSIS — G2581 Restless legs syndrome: Secondary | ICD-10-CM | POA: Diagnosis not present

## 2016-02-03 DIAGNOSIS — Z170 Estrogen receptor positive status [ER+]: Secondary | ICD-10-CM | POA: Diagnosis not present

## 2016-02-03 DIAGNOSIS — Z940 Kidney transplant status: Secondary | ICD-10-CM | POA: Diagnosis not present

## 2016-02-03 DIAGNOSIS — I129 Hypertensive chronic kidney disease with stage 1 through stage 4 chronic kidney disease, or unspecified chronic kidney disease: Secondary | ICD-10-CM | POA: Diagnosis not present

## 2016-02-03 DIAGNOSIS — D0512 Intraductal carcinoma in situ of left breast: Secondary | ICD-10-CM | POA: Diagnosis not present

## 2016-02-03 DIAGNOSIS — Z87891 Personal history of nicotine dependence: Secondary | ICD-10-CM | POA: Diagnosis not present

## 2016-02-03 DIAGNOSIS — N186 End stage renal disease: Secondary | ICD-10-CM | POA: Diagnosis not present

## 2016-02-03 DIAGNOSIS — Z8673 Personal history of transient ischemic attack (TIA), and cerebral infarction without residual deficits: Secondary | ICD-10-CM | POA: Diagnosis not present

## 2016-02-03 DIAGNOSIS — Z510 Encounter for antineoplastic radiation therapy: Secondary | ICD-10-CM | POA: Diagnosis not present

## 2016-02-03 DIAGNOSIS — I340 Nonrheumatic mitral (valve) insufficiency: Secondary | ICD-10-CM | POA: Diagnosis not present

## 2016-02-03 DIAGNOSIS — Z8614 Personal history of Methicillin resistant Staphylococcus aureus infection: Secondary | ICD-10-CM | POA: Diagnosis not present

## 2016-02-05 ENCOUNTER — Ambulatory Visit: Payer: Commercial Managed Care - HMO

## 2016-02-05 ENCOUNTER — Ambulatory Visit
Admission: RE | Admit: 2016-02-05 | Discharge: 2016-02-05 | Disposition: A | Discharge status: Home / Self Care | Payer: Commercial Managed Care - HMO | Source: Ambulatory Visit | Attending: Radiation Oncology | Admitting: Radiation Oncology

## 2016-02-05 DIAGNOSIS — I340 Nonrheumatic mitral (valve) insufficiency: Secondary | ICD-10-CM | POA: Diagnosis not present

## 2016-02-05 DIAGNOSIS — N186 End stage renal disease: Secondary | ICD-10-CM | POA: Diagnosis not present

## 2016-02-05 DIAGNOSIS — D0512 Intraductal carcinoma in situ of left breast: Secondary | ICD-10-CM | POA: Diagnosis not present

## 2016-02-05 DIAGNOSIS — G2581 Restless legs syndrome: Secondary | ICD-10-CM | POA: Diagnosis not present

## 2016-02-05 DIAGNOSIS — Z87891 Personal history of nicotine dependence: Secondary | ICD-10-CM | POA: Diagnosis not present

## 2016-02-05 DIAGNOSIS — Z170 Estrogen receptor positive status [ER+]: Secondary | ICD-10-CM | POA: Diagnosis not present

## 2016-02-05 DIAGNOSIS — Z79899 Other long term (current) drug therapy: Secondary | ICD-10-CM | POA: Diagnosis not present

## 2016-02-05 DIAGNOSIS — I499 Cardiac arrhythmia, unspecified: Secondary | ICD-10-CM | POA: Diagnosis not present

## 2016-02-05 DIAGNOSIS — Z510 Encounter for antineoplastic radiation therapy: Secondary | ICD-10-CM | POA: Diagnosis not present

## 2016-02-05 DIAGNOSIS — Z8614 Personal history of Methicillin resistant Staphylococcus aureus infection: Secondary | ICD-10-CM | POA: Diagnosis not present

## 2016-02-05 DIAGNOSIS — Z8673 Personal history of transient ischemic attack (TIA), and cerebral infarction without residual deficits: Secondary | ICD-10-CM | POA: Diagnosis not present

## 2016-02-05 DIAGNOSIS — Z940 Kidney transplant status: Secondary | ICD-10-CM | POA: Diagnosis not present

## 2016-02-05 DIAGNOSIS — I129 Hypertensive chronic kidney disease with stage 1 through stage 4 chronic kidney disease, or unspecified chronic kidney disease: Secondary | ICD-10-CM | POA: Diagnosis not present

## 2016-02-06 ENCOUNTER — Ambulatory Visit
Admission: RE | Admit: 2016-02-06 | Discharge: 2016-02-06 | Disposition: A | Discharge status: Home / Self Care | Payer: Commercial Managed Care - HMO | Source: Ambulatory Visit | Attending: Radiation Oncology | Admitting: Radiation Oncology

## 2016-02-06 ENCOUNTER — Ambulatory Visit: Payer: Commercial Managed Care - HMO

## 2016-02-06 DIAGNOSIS — Z87891 Personal history of nicotine dependence: Secondary | ICD-10-CM | POA: Diagnosis not present

## 2016-02-06 DIAGNOSIS — Z8614 Personal history of Methicillin resistant Staphylococcus aureus infection: Secondary | ICD-10-CM | POA: Diagnosis not present

## 2016-02-06 DIAGNOSIS — Z8673 Personal history of transient ischemic attack (TIA), and cerebral infarction without residual deficits: Secondary | ICD-10-CM | POA: Diagnosis not present

## 2016-02-06 DIAGNOSIS — I499 Cardiac arrhythmia, unspecified: Secondary | ICD-10-CM | POA: Diagnosis not present

## 2016-02-06 DIAGNOSIS — D0512 Intraductal carcinoma in situ of left breast: Secondary | ICD-10-CM | POA: Diagnosis not present

## 2016-02-06 DIAGNOSIS — Z940 Kidney transplant status: Secondary | ICD-10-CM | POA: Diagnosis not present

## 2016-02-06 DIAGNOSIS — Z510 Encounter for antineoplastic radiation therapy: Secondary | ICD-10-CM | POA: Diagnosis not present

## 2016-02-06 DIAGNOSIS — I340 Nonrheumatic mitral (valve) insufficiency: Secondary | ICD-10-CM | POA: Diagnosis not present

## 2016-02-06 DIAGNOSIS — I129 Hypertensive chronic kidney disease with stage 1 through stage 4 chronic kidney disease, or unspecified chronic kidney disease: Secondary | ICD-10-CM | POA: Diagnosis not present

## 2016-02-06 DIAGNOSIS — Z170 Estrogen receptor positive status [ER+]: Secondary | ICD-10-CM | POA: Diagnosis not present

## 2016-02-06 DIAGNOSIS — G2581 Restless legs syndrome: Secondary | ICD-10-CM | POA: Diagnosis not present

## 2016-02-06 DIAGNOSIS — N186 End stage renal disease: Secondary | ICD-10-CM | POA: Diagnosis not present

## 2016-02-06 DIAGNOSIS — Z79899 Other long term (current) drug therapy: Secondary | ICD-10-CM | POA: Diagnosis not present

## 2016-03-03 LAB — HM PAP SMEAR

## 2016-03-04 DIAGNOSIS — Z940 Kidney transplant status: Secondary | ICD-10-CM | POA: Diagnosis not present

## 2016-03-04 DIAGNOSIS — D899 Disorder involving the immune mechanism, unspecified: Secondary | ICD-10-CM | POA: Diagnosis not present

## 2016-03-04 DIAGNOSIS — T8610 Unspecified complication of kidney transplant: Secondary | ICD-10-CM | POA: Diagnosis not present

## 2016-03-10 DIAGNOSIS — C50919 Malignant neoplasm of unspecified site of unspecified female breast: Secondary | ICD-10-CM | POA: Diagnosis not present

## 2016-03-17 ENCOUNTER — Encounter: Payer: Self-pay | Admitting: Emergency Medicine

## 2016-03-17 ENCOUNTER — Telehealth: Payer: Self-pay | Admitting: *Deleted

## 2016-03-17 ENCOUNTER — Emergency Department: Payer: Medicare Other

## 2016-03-17 DIAGNOSIS — Z992 Dependence on renal dialysis: Secondary | ICD-10-CM | POA: Diagnosis not present

## 2016-03-17 DIAGNOSIS — I120 Hypertensive chronic kidney disease with stage 5 chronic kidney disease or end stage renal disease: Secondary | ICD-10-CM | POA: Diagnosis not present

## 2016-03-17 DIAGNOSIS — M79602 Pain in left arm: Secondary | ICD-10-CM | POA: Diagnosis not present

## 2016-03-17 DIAGNOSIS — Z940 Kidney transplant status: Secondary | ICD-10-CM | POA: Diagnosis not present

## 2016-03-17 DIAGNOSIS — Z87891 Personal history of nicotine dependence: Secondary | ICD-10-CM | POA: Insufficient documentation

## 2016-03-17 DIAGNOSIS — Z5321 Procedure and treatment not carried out due to patient leaving prior to being seen by health care provider: Secondary | ICD-10-CM | POA: Diagnosis not present

## 2016-03-17 DIAGNOSIS — N186 End stage renal disease: Secondary | ICD-10-CM | POA: Insufficient documentation

## 2016-03-17 DIAGNOSIS — M25522 Pain in left elbow: Secondary | ICD-10-CM | POA: Diagnosis not present

## 2016-03-17 LAB — CK: Total CK: 221 U/L (ref 38–234)

## 2016-03-17 LAB — BASIC METABOLIC PANEL
Anion gap: 7 (ref 5–15)
BUN: 36 mg/dL — ABNORMAL HIGH (ref 6–20)
CALCIUM: 9.9 mg/dL (ref 8.9–10.3)
CHLORIDE: 108 mmol/L (ref 101–111)
CO2: 19 mmol/L — ABNORMAL LOW (ref 22–32)
CREATININE: 2.53 mg/dL — AB (ref 0.44–1.00)
GFR calc non Af Amer: 21 mL/min — ABNORMAL LOW (ref >60)
GFR, EST AFRICAN AMERICAN: 25 mL/min — AB (ref >60)
Glucose, Bld: 111 mg/dL — ABNORMAL HIGH (ref 65–99)
Potassium: 4.2 mmol/L (ref 3.5–5.1)
SODIUM: 134 mmol/L — AB (ref 135–145)

## 2016-03-17 LAB — CBC
HCT: 37.5 % (ref 35.0–47.0)
Hemoglobin: 12.5 g/dL (ref 12.0–16.0)
MCH: 29.8 pg (ref 26.0–34.0)
MCHC: 33.5 g/dL (ref 32.0–36.0)
MCV: 88.9 fL (ref 80.0–100.0)
PLATELETS: 226 10*3/uL (ref 150–440)
RBC: 4.21 MIL/uL (ref 3.80–5.20)
RDW: 13.9 % (ref 11.5–14.5)
WBC: 8.4 10*3/uL (ref 3.6–11.0)

## 2016-03-17 LAB — TROPONIN I: Troponin I: <0.03 ng/mL (ref <0.03)

## 2016-03-17 NOTE — Telephone Encounter (Signed)
Patient notified of appointment change.  Date and Time were acceptable to patient.

## 2016-03-17 NOTE — ED Triage Notes (Signed)
Pt comes into the ED via POV c/o left arm pain that started around a month ago.  Patient had left sided breast cancer and breast surgery, but no lymph nodes were removed.  Denies h/o lymphedema, chest pain, shortness of breath, or specific injury to arm.  Patient states the pain has began throbbing more today than normal.  Patient in NAD at this time with even and unlabored respirations.  Patient's transplant physician put her on tramadol to see if it helped, it has given no relief.

## 2016-03-18 ENCOUNTER — Emergency Department
Admission: EM | Admit: 2016-03-18 | Discharge: 2016-03-18 | Disposition: A | Discharge status: Home / Self Care | Payer: Medicare Other | Attending: Emergency Medicine | Admitting: Emergency Medicine

## 2016-03-18 ENCOUNTER — Telehealth: Payer: Self-pay | Admitting: Emergency Medicine

## 2016-03-18 ENCOUNTER — Ambulatory Visit: Payer: Medicare Other | Admitting: Radiation Oncology

## 2016-03-18 ENCOUNTER — Inpatient Hospital Stay: Payer: Self-pay

## 2016-03-18 NOTE — Telephone Encounter (Signed)
Called patient due to lwot to inquire about condition and follow up plans. Left message.   

## 2016-03-24 ENCOUNTER — Inpatient Hospital Stay: Payer: Medicare Other | Attending: Nephrology

## 2016-03-24 ENCOUNTER — Encounter: Payer: Self-pay | Admitting: Radiation Oncology

## 2016-03-24 ENCOUNTER — Encounter (INDEPENDENT_AMBULATORY_CARE_PROVIDER_SITE_OTHER): Payer: Self-pay

## 2016-03-24 ENCOUNTER — Ambulatory Visit
Admission: RE | Admit: 2016-03-24 | Discharge: 2016-03-24 | Disposition: A | Discharge status: Home / Self Care | Payer: Medicare Other | Source: Ambulatory Visit | Attending: Radiation Oncology | Admitting: Radiation Oncology

## 2016-03-24 VITALS — BP 121/84 | HR 69 | Temp 96.3°F | Resp 20 | Wt 217.8 lb

## 2016-03-24 DIAGNOSIS — D0512 Intraductal carcinoma in situ of left breast: Secondary | ICD-10-CM | POA: Diagnosis not present

## 2016-03-24 DIAGNOSIS — Z170 Estrogen receptor positive status [ER+]: Secondary | ICD-10-CM | POA: Diagnosis not present

## 2016-03-24 DIAGNOSIS — Z923 Personal history of irradiation: Secondary | ICD-10-CM | POA: Insufficient documentation

## 2016-03-24 DIAGNOSIS — Z940 Kidney transplant status: Secondary | ICD-10-CM | POA: Insufficient documentation

## 2016-03-24 DIAGNOSIS — Z79810 Long term (current) use of selective estrogen receptor modulators (SERMs): Secondary | ICD-10-CM | POA: Diagnosis not present

## 2016-03-24 DIAGNOSIS — N186 End stage renal disease: Secondary | ICD-10-CM | POA: Insufficient documentation

## 2016-03-24 NOTE — Progress Notes (Signed)
Radiation Oncology Follow up Note  Name: [name]   Date:   03/24/2016 MRN:  [id number] DOB: 1966/06/23    This 50 y.o. female presents to the clinic today for one-month follow-up status post radiation therapy to her left breast for ductal carcinoma in site.  REFERRING PROVIDER: True, Isabella Bowens, MD  HPI: Patient is now seen out 1 month having completed radiation therapy to her left breast for ductal carcinoma in situ ER/PR positive. She is seen today in routine follow-up and is doing well she specifically denies breast tenderness cough or bone pain. She is orally started on tamoxifen is tolerated that well without side effect. She does have a history of end-stage renal disease and is status post transplantation..  COMPLICATIONS OF TREATMENT: none  FOLLOW UP COMPLIANCE: keeps appointments   PHYSICAL EXAM:  BP 121/84   Pulse 69   Temp (!) 96.3 F (35.7 C)   Resp 20   Wt 217 lb 13 oz (98.8 kg)   LMP 03/03/2016   BMI 34.11 kg/m  Lungs are clear to A&P cardiac examination essentially unremarkable with regular rate and rhythm. No dominant mass or nodularity is noted in either breast in 2 positions examined. Incision is well-healed. No axillary or supraclavicular adenopathy is appreciated. Cosmetic result is excellent. She still has hyperpigmentation of her left breast. Well-developed well-nourished patient in NAD. HEENT reveals PERLA, EOMI, discs not visualized.  Oral cavity is clear. No oral mucosal lesions are identified. Neck is clear without evidence of cervical or supraclavicular adenopathy. Lungs are clear to A&P. Cardiac examination is essentially unremarkable with regular rate and rhythm without murmur rub or thrill. Abdomen is benign with no organomegaly or masses noted. Motor sensory and DTR levels are equal and symmetric in the upper and lower extremities. Cranial nerves II through XII are grossly intact. Proprioception is intact. No peripheral adenopathy or edema is identified.  No motor or sensory levels are noted. Crude visual fields are within normal range.  RADIOLOGY RESULTS: No current films for review  PLAN: Present time patient is doing well 1 month out with no evidence of disease healing nicely from her surgery and radiation therapy. I am please were overall progress. She continues on tamoxifen therapy without side effect. I've asked to see her back in 4-5 months for follow-up. She knows to call sooner with any concerns.  I would like to take this opportunity to thank you for allowing me to participate in the care of your patient.Armstead Peaks., MD

## 2016-05-18 DIAGNOSIS — R875 Abnormal microbiological findings in specimens from female genital organs: Secondary | ICD-10-CM | POA: Diagnosis not present

## 2016-05-18 DIAGNOSIS — Z01419 Encounter for gynecological examination (general) (routine) without abnormal findings: Secondary | ICD-10-CM | POA: Diagnosis not present

## 2016-05-18 LAB — HM PAP SMEAR: HM PAP: NEGATIVE

## 2016-07-08 DIAGNOSIS — D899 Disorder involving the immune mechanism, unspecified: Secondary | ICD-10-CM | POA: Diagnosis not present

## 2016-07-08 DIAGNOSIS — Z940 Kidney transplant status: Secondary | ICD-10-CM | POA: Diagnosis not present

## 2016-07-08 DIAGNOSIS — N270 Small kidney, unilateral: Secondary | ICD-10-CM | POA: Diagnosis not present

## 2016-08-09 ENCOUNTER — Encounter: Payer: Self-pay | Admitting: Emergency Medicine

## 2016-08-09 ENCOUNTER — Emergency Department: Payer: Medicare Other

## 2016-08-09 ENCOUNTER — Inpatient Hospital Stay
Admission: EM | Admit: 2016-08-09 | Discharge: 2016-08-11 | DRG: 872 | Disposition: A | Discharge status: Home / Self Care | Payer: Medicare Other | Attending: Internal Medicine | Admitting: Internal Medicine

## 2016-08-09 DIAGNOSIS — Z79810 Long term (current) use of selective estrogen receptor modulators (SERMs): Secondary | ICD-10-CM | POA: Diagnosis not present

## 2016-08-09 DIAGNOSIS — R509 Fever, unspecified: Secondary | ICD-10-CM | POA: Diagnosis not present

## 2016-08-09 DIAGNOSIS — Z87891 Personal history of nicotine dependence: Secondary | ICD-10-CM

## 2016-08-09 DIAGNOSIS — R0682 Tachypnea, not elsewhere classified: Secondary | ICD-10-CM | POA: Diagnosis not present

## 2016-08-09 DIAGNOSIS — A419 Sepsis, unspecified organism: Secondary | ICD-10-CM | POA: Diagnosis not present

## 2016-08-09 DIAGNOSIS — R059 Cough, unspecified: Secondary | ICD-10-CM

## 2016-08-09 DIAGNOSIS — J209 Acute bronchitis, unspecified: Secondary | ICD-10-CM | POA: Diagnosis present

## 2016-08-09 DIAGNOSIS — Z8249 Family history of ischemic heart disease and other diseases of the circulatory system: Secondary | ICD-10-CM

## 2016-08-09 DIAGNOSIS — Z8614 Personal history of Methicillin resistant Staphylococcus aureus infection: Secondary | ICD-10-CM

## 2016-08-09 DIAGNOSIS — N183 Chronic kidney disease, stage 3 (moderate): Secondary | ICD-10-CM | POA: Diagnosis not present

## 2016-08-09 DIAGNOSIS — Z853 Personal history of malignant neoplasm of breast: Secondary | ICD-10-CM

## 2016-08-09 DIAGNOSIS — D899 Disorder involving the immune mechanism, unspecified: Secondary | ICD-10-CM | POA: Diagnosis not present

## 2016-08-09 DIAGNOSIS — Z940 Kidney transplant status: Secondary | ICD-10-CM | POA: Diagnosis not present

## 2016-08-09 DIAGNOSIS — Z8673 Personal history of transient ischemic attack (TIA), and cerebral infarction without residual deficits: Secondary | ICD-10-CM | POA: Diagnosis not present

## 2016-08-09 DIAGNOSIS — Z79899 Other long term (current) drug therapy: Secondary | ICD-10-CM

## 2016-08-09 DIAGNOSIS — D849 Immunodeficiency, unspecified: Secondary | ICD-10-CM

## 2016-08-09 DIAGNOSIS — B9789 Other viral agents as the cause of diseases classified elsewhere: Secondary | ICD-10-CM | POA: Diagnosis present

## 2016-08-09 DIAGNOSIS — J3489 Other specified disorders of nose and nasal sinuses: Secondary | ICD-10-CM | POA: Diagnosis not present

## 2016-08-09 DIAGNOSIS — G40909 Epilepsy, unspecified, not intractable, without status epilepticus: Secondary | ICD-10-CM | POA: Diagnosis present

## 2016-08-09 DIAGNOSIS — J40 Bronchitis, not specified as acute or chronic: Secondary | ICD-10-CM | POA: Diagnosis not present

## 2016-08-09 DIAGNOSIS — R05 Cough: Secondary | ICD-10-CM | POA: Diagnosis not present

## 2016-08-09 HISTORY — DX: Unspecified convulsions: R56.9

## 2016-08-09 HISTORY — DX: Sepsis, unspecified organism: A41.9

## 2016-08-09 LAB — EXPECTORATED SPUTUM ASSESSMENT W REFEX TO RESP CULTURE

## 2016-08-09 LAB — URINALYSIS, COMPLETE (UACMP) WITH MICROSCOPIC
BILIRUBIN URINE: NEGATIVE
GLUCOSE, UA: NEGATIVE mg/dL
KETONES UR: NEGATIVE mg/dL
LEUKOCYTES UA: NEGATIVE
Nitrite: NEGATIVE
PH: 5 (ref 5.0–8.0)
Protein, ur: 30 mg/dL — AB
Specific Gravity, Urine: 1.018 (ref 1.005–1.030)

## 2016-08-09 LAB — COMPREHENSIVE METABOLIC PANEL
ALT: 18 U/L (ref 14–54)
ANION GAP: 8 (ref 5–15)
AST: 21 U/L (ref 15–41)
Albumin: 4 g/dL (ref 3.5–5.0)
Alkaline Phosphatase: 30 U/L — ABNORMAL LOW (ref 38–126)
BILIRUBIN TOTAL: 0.5 mg/dL (ref 0.3–1.2)
BUN: 36 mg/dL — ABNORMAL HIGH (ref 6–20)
CHLORIDE: 108 mmol/L (ref 101–111)
CO2: 17 mmol/L — ABNORMAL LOW (ref 22–32)
Calcium: 9.6 mg/dL (ref 8.9–10.3)
Creatinine, Ser: 2.05 mg/dL — ABNORMAL HIGH (ref 0.44–1.00)
GFR, EST AFRICAN AMERICAN: 32 mL/min — AB (ref >60)
GFR, EST NON AFRICAN AMERICAN: 27 mL/min — AB (ref >60)
Glucose, Bld: 132 mg/dL — ABNORMAL HIGH (ref 65–99)
POTASSIUM: 4.5 mmol/L (ref 3.5–5.1)
Sodium: 133 mmol/L — ABNORMAL LOW (ref 135–145)
TOTAL PROTEIN: 7.8 g/dL (ref 6.5–8.1)

## 2016-08-09 LAB — EXPECTORATED SPUTUM ASSESSMENT W GRAM STAIN, RFLX TO RESP C

## 2016-08-09 LAB — CBC
HEMATOCRIT: 35.8 % (ref 35.0–47.0)
Hemoglobin: 12.2 g/dL (ref 12.0–16.0)
MCH: 30.5 pg (ref 26.0–34.0)
MCHC: 34.1 g/dL (ref 32.0–36.0)
MCV: 89.3 fL (ref 80.0–100.0)
PLATELETS: 177 10*3/uL (ref 150–440)
RBC: 4.01 MIL/uL (ref 3.80–5.20)
RDW: 13.7 % (ref 11.5–14.5)
WBC: 7.3 10*3/uL (ref 3.6–11.0)

## 2016-08-09 LAB — MRSA PCR SCREENING: MRSA BY PCR: NEGATIVE

## 2016-08-09 LAB — RAPID INFLUENZA A&B ANTIGENS
Influenza A (ARMC): NEGATIVE
Influenza B (ARMC): NEGATIVE

## 2016-08-09 MED ORDER — TACROLIMUS 1 MG PO CAPS
4.0000 mg | ORAL_CAPSULE | Freq: Two times a day (BID) | ORAL | Status: DC
  Administered 2016-08-09 – 2016-08-11 (×4): 4 mg via ORAL
  Filled 2016-08-09 (×7): qty 4

## 2016-08-09 MED ORDER — LEVETIRACETAM 500 MG PO TABS
500.0000 mg | ORAL_TABLET | Freq: Two times a day (BID) | ORAL | Status: DC
  Administered 2016-08-09 – 2016-08-11 (×5): 500 mg via ORAL
  Filled 2016-08-09 (×5): qty 1

## 2016-08-09 MED ORDER — MYCOPHENOLATE SODIUM 180 MG PO TBEC
360.0000 mg | DELAYED_RELEASE_TABLET | Freq: Three times a day (TID) | ORAL | Status: DC
  Administered 2016-08-09 – 2016-08-11 (×5): 360 mg via ORAL
  Filled 2016-08-09 (×6): qty 2

## 2016-08-09 MED ORDER — HEPARIN SODIUM (PORCINE) 5000 UNIT/ML IJ SOLN
5000.0000 [IU] | Freq: Three times a day (TID) | INTRAMUSCULAR | Status: DC
  Filled 2016-08-09: qty 1

## 2016-08-09 MED ORDER — ASPIRIN EC 81 MG PO TBEC
81.0000 mg | DELAYED_RELEASE_TABLET | Freq: Every day | ORAL | Status: DC
  Administered 2016-08-09 – 2016-08-11 (×3): 81 mg via ORAL
  Filled 2016-08-09 (×3): qty 1

## 2016-08-09 MED ORDER — SODIUM BICARBONATE 650 MG PO TABS
1300.0000 mg | ORAL_TABLET | Freq: Three times a day (TID) | ORAL | Status: DC
  Administered 2016-08-09 – 2016-08-11 (×6): 1300 mg via ORAL
  Filled 2016-08-09 (×6): qty 2

## 2016-08-09 MED ORDER — ACETAMINOPHEN 325 MG PO TABS
650.0000 mg | ORAL_TABLET | Freq: Four times a day (QID) | ORAL | Status: DC | PRN
  Administered 2016-08-09 – 2016-08-11 (×3): 650 mg via ORAL
  Filled 2016-08-09 (×4): qty 2

## 2016-08-09 MED ORDER — HYDROCODONE-ACETAMINOPHEN 5-325 MG PO TABS
1.0000 | ORAL_TABLET | ORAL | Status: DC | PRN
  Administered 2016-08-09: 2 via ORAL
  Administered 2016-08-09: 1 via ORAL
  Filled 2016-08-09: qty 1
  Filled 2016-08-09 (×2): qty 2

## 2016-08-09 MED ORDER — GUAIFENESIN-DM 100-10 MG/5ML PO SYRP
5.0000 mL | ORAL_SOLUTION | ORAL | Status: DC | PRN
  Administered 2016-08-09: 5 mL via ORAL
  Filled 2016-08-09 (×2): qty 5

## 2016-08-09 MED ORDER — VANCOMYCIN HCL 10 G IV SOLR
2000.0000 mg | Freq: Once | INTRAVENOUS | Status: AC
  Administered 2016-08-09: 2000 mg via INTRAVENOUS
  Filled 2016-08-09: qty 2000

## 2016-08-09 MED ORDER — SENNOSIDES-DOCUSATE SODIUM 8.6-50 MG PO TABS: 1.0000 | ORAL_TABLET | Freq: Every evening | ORAL | Status: DC | PRN

## 2016-08-09 MED ORDER — GABAPENTIN 300 MG PO CAPS
600.0000 mg | ORAL_CAPSULE | Freq: Every day | ORAL | Status: DC
  Administered 2016-08-09 – 2016-08-10 (×2): 600 mg via ORAL
  Filled 2016-08-09 (×2): qty 2

## 2016-08-09 MED ORDER — CARVEDILOL 12.5 MG PO TABS: 12.5000 mg | ORAL_TABLET | Freq: Two times a day (BID) | ORAL | Status: DC

## 2016-08-09 MED ORDER — BENZONATATE 100 MG PO CAPS
100.0000 mg | ORAL_CAPSULE | Freq: Three times a day (TID) | ORAL | Status: DC
  Administered 2016-08-09 – 2016-08-11 (×6): 100 mg via ORAL
  Filled 2016-08-09 (×6): qty 1

## 2016-08-09 MED ORDER — TAMOXIFEN CITRATE 20 MG PO TABS
20.0000 mg | ORAL_TABLET | Freq: Every day | ORAL | Status: DC
  Administered 2016-08-09 – 2016-08-11 (×3): 20 mg via ORAL
  Filled 2016-08-09 (×3): qty 1

## 2016-08-09 MED ORDER — ONDANSETRON HCL 4 MG PO TABS: 4.0000 mg | ORAL_TABLET | Freq: Four times a day (QID) | ORAL | Status: DC | PRN

## 2016-08-09 MED ORDER — SODIUM CHLORIDE 0.9 % IV SOLN
INTRAVENOUS | Status: DC
  Administered 2016-08-09 – 2016-08-10 (×3): via INTRAVENOUS

## 2016-08-09 MED ORDER — PANTOPRAZOLE SODIUM 40 MG PO TBEC
40.0000 mg | DELAYED_RELEASE_TABLET | Freq: Every day | ORAL | Status: DC
  Administered 2016-08-09 – 2016-08-11 (×3): 40 mg via ORAL
  Filled 2016-08-09 (×3): qty 1

## 2016-08-09 MED ORDER — CARVEDILOL 12.5 MG PO TABS
12.5000 mg | ORAL_TABLET | Freq: Two times a day (BID) | ORAL | Status: DC
  Administered 2016-08-09 – 2016-08-11 (×4): 12.5 mg via ORAL
  Filled 2016-08-09 (×4): qty 1

## 2016-08-09 MED ORDER — ACETAMINOPHEN 650 MG RE SUPP: 650.0000 mg | Freq: Four times a day (QID) | RECTAL | Status: DC | PRN

## 2016-08-09 MED ORDER — ADULT MULTIVITAMIN W/MINERALS CH
1.0000 | ORAL_TABLET | Freq: Every day | ORAL | Status: DC
  Administered 2016-08-10 – 2016-08-11 (×2): 1 via ORAL
  Filled 2016-08-09 (×2): qty 1

## 2016-08-09 MED ORDER — GABAPENTIN 300 MG PO CAPS
300.0000 mg | ORAL_CAPSULE | Freq: Two times a day (BID) | ORAL | Status: DC
  Administered 2016-08-09 – 2016-08-11 (×4): 300 mg via ORAL
  Filled 2016-08-09 (×5): qty 1

## 2016-08-09 MED ORDER — ONDANSETRON HCL 4 MG/2ML IJ SOLN: 4.0000 mg | Freq: Four times a day (QID) | INTRAMUSCULAR | Status: DC | PRN

## 2016-08-09 MED ORDER — SODIUM CHLORIDE 0.9 % IV BOLUS (SEPSIS)
1000.0000 mL | Freq: Once | INTRAVENOUS | Status: AC
Start: 2016-08-09 — End: 2016-08-09
  Administered 2016-08-09: 1000 mL via INTRAVENOUS

## 2016-08-09 MED ORDER — FLUTICASONE PROPIONATE 50 MCG/ACT NA SUSP
1.0000 | Freq: Every day | NASAL | Status: DC
  Administered 2016-08-09: 1 via NASAL
  Filled 2016-08-09: qty 16

## 2016-08-09 MED ORDER — PIPERACILLIN-TAZOBACTAM 3.375 G IVPB
3.3750 g | Freq: Three times a day (TID) | INTRAVENOUS | Status: DC
  Administered 2016-08-09 – 2016-08-10 (×4): 3.375 g via INTRAVENOUS
  Filled 2016-08-09 (×4): qty 50

## 2016-08-09 MED ORDER — PIPERACILLIN-TAZOBACTAM 3.375 G IVPB 30 MIN
3.3750 g | Freq: Once | INTRAVENOUS | Status: AC
  Administered 2016-08-09: 3.375 g via INTRAVENOUS
  Filled 2016-08-09: qty 50

## 2016-08-09 NOTE — ED Provider Notes (Signed)
[facility] Emergency Department Provider Note  ____________________________________________  Time seen: Approximately 8:23 AM  I have reviewed the triage vital signs and the nursing notes.   HISTORY  Chief Complaint URI    HPI [name] is a 51 y.o. female s/p renal transplant 2015 on immunosuppressive therapy, breast cancer not currently on chemotherapy, presenting with cough and fever. The patient reports that for the past 3 weeks she has had intermittent cough that is productive, but over the last several days she has had fever up to 101.0. She reports congestion, rhinorrhea. No ear pain or sore throat. No nausea vomiting or diarrhea, abdominal pain.   Past Medical History:  Diagnosis Date  . Arrhythmia   . Cerebrovascular accident [facility]) 2010   without any focal neurological deficits.   . Clotted renal dialysis AV graft ([place])   . End stage renal disease ([place])    a. previously on dialysis on Tues, Thurs, & 07-Nov-2022 x 7 yrs; b. s/p deceased donor transplant 2014/03/31   . History of methicillin resistant Staphylococcus aureus infection   . History of stress test    a. 04/2013: normal, EF 65%  . Hypertension   . Mitral regurgitation    a. 01/2014: EF >55%, LVH, mild MR, dilated LA  . Renal transplant, status post March 31, 2014  . RLS (restless legs syndrome)   . Seizure disorder ([place])   . Seizures [facility])     Patient Active Problem List   Diagnosis Date Noted  . Mitral regurgitation   . Renal transplant, status post Mar 31, 2014  . SVT (supraventricular tachycardia) ([place]) 11/17/2013  . End stage renal disease ([place]) 11/17/2013  . 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 Seizure disorder ([place]) 11/17/2013  . Essential hypertension 11/17/2013    Past Surgical History:  Procedure Laterality Date  . INSERTION OF DIALYSIS CATHETER     x 2  . KIDNEY TRANSPLANT  03/2014   right side.     Current Outpatient Rx  . Order #: [id number] Class: Historical Med  . Order #: [id number] Class: Historical Med  . Order #: [id number] Class: Historical Med  . Order #: [id number] Class: Historical Med  . Order #: [id number] Class: Historical Med  . Order #: [id number] Class: Historical Med  . Order #: [id number] Class: Historical Med  . Order #: [id number] Class: Historical Med  . Order #: [id number] Class: Historical Med  . Order #: [id number] Class: Historical Med  . Order #: [id number] Class: Historical Med  . Order #: [id number] Class: Historical Med  . Order #: [id number] Class: Normal  . Order #: [id number] Class: Normal  . Order #: [id number] Class: Historical Med  . Order #: [id number] Class: Historical Med  . Order #: [id number] Class: Historical Med  . Order #: [id number] Class: Historical Med  . Order #: [id number] Class: Historical Med    Allergies Heparin; Ibuprofen; and Penicillins  Family History  Problem Relation Age of Onset  . Heart attack Mother   . Hypertension Mother   . Hyperlipidemia Mother   . Heart attack Father   . Hypertension Father   . Hyperlipidemia Father     Social History Social History  Substance Use Topics  . Smoking status: Former Smoker    Quit date: 11/25/2008  . Smokeless tobacco: Never Used  . Alcohol use No    Review of Systems Constitutional: Positive fever. Positive myalgias and general malaise. Eyes: No visual changes. No eye discharge. ENT: No sore throat. Positive congestion and rhinorrhea. No ear pain. Cardiovascular: Denies chest pain.  Denies palpitations. Respiratory: Denies shortness of breath.  Positive productive cough. Gastrointestinal: No abdominal pain.  No nausea, no vomiting.  No diarrhea.  No constipation. Genitourinary: Negative for dysuria. Musculoskeletal: Negative for back pain. Skin: Negative for rash. Neurological: Negative for headaches. No focal numbness, tingling or weakness.   10-point ROS otherwise  negative.  ____________________________________________   PHYSICAL EXAM:  VITAL SIGNS: ED Triage Vitals  Enc Vitals Group     BP 08/09/16 0213 108/65     Pulse Rate 08/09/16 0213 100     Resp 08/09/16 0213 16     Temp 08/09/16 0213 (!) 101 F (38.3 C)     Temp Source 08/09/16 0213 Oral     SpO2 08/09/16 0213 97 %     Weight 08/09/16 0215 225 lb (102.1 kg)     Height 08/09/16 0215 5\' 5"  (1.651 m)     Head Circumference --      Peak Flow --      Pain Score 08/09/16 0215 8     Pain Loc --      Pain Edu? --      Excl. in [place]? --     Constitutional: Alert and oriented. Chronically ill appearing but in no acute distress. Answers questions appropriately. Eyes: Conjunctivae are normal.  EOMI. No scleral icterus. No eye discharge. Head: Atraumatic. Nose: Positive congestion without rhinorrhea. Mouth/Throat: Mucous membranes are moist.  Neck: No stridor.  Supple.  No JVD. No meningismus. Cardiovascular: Normal rate, regular rhythm. No murmurs, rubs or gallops.  Respiratory: Normal respiratory effort.  No accessory muscle use or retractions. Lungs CTAB.  No wheezes, rales or ronchi. Gastrointestinal: Overweight. Soft, nontender and nondistended.  No guarding or rebound.  No peritoneal signs. Musculoskeletal: No LE edema. Neurologic:  A&Ox3.  Speech is clear.  Face and smile are symmetric.  EOMI.  Moves all extremities well. Skin:  Skin is warm, dry and intact. No rash noted. Psychiatric: Mood and affect are normal. Speech and behavior are normal.  Normal judgement.  ____________________________________________   LABS (all labs ordered are listed, but only abnormal results are displayed)  Labs Reviewed  RAPID INFLUENZA A&B ANTIGENS ([facility] ONLY)  CULTURE, BLOOD (ROUTINE X 2)  CULTURE, BLOOD (ROUTINE X 2)  URINE CULTURE  CULTURE, EXPECTORATED SPUTUM-ASSESSMENT  CBC  COMPREHENSIVE METABOLIC PANEL  URINALYSIS, COMPLETE (UACMP) WITH MICROSCOPIC    ____________________________________________  EKG  ED ECG REPORT I, Eula Listen, the attending physician, personally viewed and interpreted this ECG.   Date: 08/09/2016  EKG Time: 929  Rate: 87  Rhythm: normal sinus rhythm  Axis: normal  Intervals:none  ST&T Change: No STEMI  ____________________________________________  RADIOLOGY  No results found.  ____________________________________________   PROCEDURES  Procedure(s) performed: None  Procedures  Critical Care performed: No ____________________________________________   INITIAL IMPRESSION / ASSESSMENT AND PLAN / ED COURSE  Pertinent labs & imaging results that were available during my care of the patient were reviewed by me and considered in my medical decision making (see chart for details).  51 y.o. female status post renal transplant on immunosuppressive therapy presenting with several weeks of productive cough, now with fever. Overall, the patient is well-appearing without a toxic examination, but I am concerned about her fever, especially in the setting of immunosuppressive therapy. We will culture her with blood cultures, urine culture. I'll get a chest x-ray to evaluate for pneumonia. Her influenza testing is negative. She will receive broad-spectrum antibiotics and require admission to the hospital for further evaluation and treatment.  -----------------------------------------  10:30 AM on 08/09/2016 -----------------------------------------  The patient is admitted to the hospitalists at this time. Her influenza testing is negative, her white blood cell count is reassuring. She is minimally hyponatremic and has a creatinine of 2.05 which appears to be within her recent baseline. The hospitalist will follow up her urinalysis. I have paged the renal transplant physician on call at [facility] for any further recommendations or requests. ____________________________________________  FINAL CLINICAL  IMPRESSION(S) / ED DIAGNOSES  Final diagnoses:  None    Clinical Course as of Aug 09 1028  Sun Aug 09, 2016  1009 Patient's chest x-ray is consistent with bronchitis, without any acute infiltrate. Her CBC has a normal white blood cell count. Her influenza testing is negative. I'm awaiting the result remainder of her labs as well as urinalysis. The patient has received broad-spectrum antibiotics and has tolerated those well. She is feeling slightly better at this time. Plan admission.  [AN]    Clinical Course User Index [AN] Eula Listen, MD      NEW MEDICATIONS STARTED DURING THIS VISIT:  New Prescriptions   No medications on file      Eula Listen, MD 08/09/16 1047

## 2016-08-09 NOTE — Progress Notes (Signed)
Called D. Mody to get the order for heparin discontinued since the patient is allergic, and also patient asking for a multivitamin.  She gave me a verbal order to discontinue and for a multivitamin.

## 2016-08-09 NOTE — H&P (Signed)
[facility] [place] NAME: [name]    MR#:  [id number]  DATE OF BIRTH:  06/03/1966  DATE OF ADMISSION:  08/09/2016  PRIMARY CARE PHYSICIAN: Jolene Schimke, MD   REQUESTING/REFERRING PHYSICIAN: *dr Mariea Clonts CHIEF COMPLAINT:   Fever and cough HISTORY OF PRESENT ILLNESS:  [name]  is a 51 y.o. female with a known history of s/p renal transplant 2015 on immunosuppressive therapy, breast cancer and CKD stage 3 who presents with cough, sinus drainage and fever. She reports over the past week she has had increasing sinus drainage. Over the past 3 days she has had fevers. She initially had low-grade fevers ranging from 99-99.9. Today however she reports she had a temperature of 101 so she presented to the ER for further evaluation. Her cough is productive with thick white sputum. She denies chills or shortness of breath. She denies sick contacts. She is up-to-date with her vaccinations. She last saw her renal transplant physician in December. She denies chest pain, urinary symptoms or back pain.  PAST MEDICAL HISTORY:   Past Medical History:  Diagnosis Date  . Arrhythmia   . Cerebrovascular accident [facility]) 2010   without any focal neurological deficits.   . Clotted renal dialysis AV graft ([place])   . End stage renal disease ([place])    a. previously on dialysis on Tues, Thurs, & 11-12-2022 x 7 yrs; b. s/p deceased donor transplant Apr 05, 2014   . History of methicillin resistant Staphylococcus aureus infection   . History of stress test    a. 04/2013: normal, EF 65%  . Hypertension   . Mitral regurgitation    a. 01/2014: EF >55%, LVH, mild MR, dilated LA  . Renal transplant, status post 04-05-2014  . RLS (restless legs syndrome)   . Seizure disorder ([place])   . Seizures ([place])     PAST SURGICAL HISTORY:   Past Surgical History:  Procedure Laterality Date  . INSERTION OF DIALYSIS CATHETER     x 2  . KIDNEY TRANSPLANT  03/2014   right side.    SOCIAL  HISTORY:   Social History  Substance Use Topics  . Smoking status: Former Smoker    Quit date: 11/25/2008  . Smokeless tobacco: Never Used  . Alcohol use No    FAMILY HISTORY:   Family History  Problem Relation Age of Onset  . Heart attack Mother   . Hypertension Mother   . Hyperlipidemia Mother   . Heart attack Father   . Hypertension Father   . Hyperlipidemia Father     DRUG ALLERGIES:   Allergies  Allergen Reactions  . Heparin   . Ibuprofen     Caused kidney damage  . Penicillins     REVIEW OF SYSTEMS:   Review of Systems  Constitutional: Positive for fever and malaise/fatigue. Negative for chills.  HENT: Negative.  Negative for ear discharge, ear pain, hearing loss, nosebleeds and sore throat.   Eyes: Negative.  Negative for blurred vision and pain.  Respiratory: Positive for cough. Negative for hemoptysis, shortness of breath and wheezing.   Cardiovascular: Negative.  Negative for chest pain, palpitations and leg swelling.  Gastrointestinal: Negative.  Negative for abdominal pain, blood in stool, diarrhea, nausea and vomiting.  Genitourinary: Negative.  Negative for dysuria.  Musculoskeletal: Negative.  Negative for back pain.  Skin: Negative.   Neurological: Positive for weakness. Negative for dizziness, tremors, speech change, focal weakness, seizures and headaches.  Endo/Heme/Allergies: Negative.  Does not bruise/bleed  easily.  Psychiatric/Behavioral: Negative.  Negative for depression, hallucinations and suicidal ideas.    MEDICATIONS AT HOME:   Prior to Admission medications   Medication Sig Start Date End Date Taking? Authorizing Provider      Yes Historical Provider, MD    08/10/14  Yes Historical Provider, MD  fluticasone Asencion Islam) 50 MCG/ACT nasal spray  03/11/16  Yes Historical Provider, MD  aspirin EC 81 MG tablet Take 81 mg by mouth daily.    Historical Provider, MD  carvedilol (COREG) 12.5 MG tablet Take 12.5 mg by mouth 2 (two) times daily with a  meal.  05/15/14 05/15/15  Historical Provider, MD  clindamycin (CLEOCIN) 300 MG capsule Reported on 11/26/2015 07/04/14   Historical Provider, MD  gabapentin (NEURONTIN) 300 MG capsule Take 3 capsules (300 mg) by mouth in the morning and at noon, 600 mg in the evening. 06/13/14   Historical Provider, MD  levETIRAcetam (KEPPRA) 250 MG tablet Take 500 mg by mouth 3) times daily.  12/26/13   Historical Provider, MD  mycophenolate (MYFORTIC) 180 MG EC tablet Take 360 mg by mouth 2 (two) times daily.  10/22/15 10/21/16  Historical Provider, MD  NASONEX 50 MCG/ACT nasal spray Place 2 sprays into the nose daily.  11/01/13   Historical Provider, MD  omeprazole (PRILOSEC) 20 MG capsule Take 20 mg by mouth at bedtime.  09/15/13   Historical Provider, MD   Take 5 mg by mouth every 4 (four) hours as needed for severe pain.    Historical Provider, MD   Take 1 tablet (25 mg total) by mouth every 6 (six) hours as needed for nausea or vomiting. 12/26/15   Noreene Filbert, MD   Apply 1 application topically 2 (two) times daily. Patient not taking: Reported on 03/24/2016 01/06/16   Noreene Filbert, MD  sodium bicarbonate 325 MG tablet Take 650mg  by mouth daily. Reported on 11/26/2015    Historical Provider, MD  sodium bicarbonate 650 MG tablet Take 2 tablets (1300 mg) three times a day. 04/11/14   Historical Provider, MD  tacrolimus (PROGRAF) 1 MG capsule 6mg  in the am and 6mg  in the pm, V42.0 kidney transplant, tx date 03/24/14, DAW1 06/06/14   Historical Provider, MD  tamoxifen (NOLVADEX) 20 MG tablet Take 20 mg by mouth. 12/03/15 12/02/16  Historical Provider, MD    03/04/16   Historical Provider, MD      VITAL SIGNS:  Blood pressure 120/82, pulse 86, temperature 99.6 F (37.6 C), temperature source Oral, resp. rate (!) 32, height 5\' 5"  (1.651 m), weight 102.1 kg (225 lb), last menstrual period 03/03/2016, SpO2 96 %.  PHYSICAL EXAMINATION:   Physical Exam    LABORATORY PANEL:   CBC  Recent Labs Lab 08/09/16 0918  WBC  7.3  HGB 12.2  HCT 35.8  PLT 177   ------------------------------------------------------------------------------------------------------------------  Chemistries   Recent Labs Lab 08/09/16 0918  NA 133*  K 4.5  CL 108  CO2 17*  GLUCOSE 132*  BUN 36*  CREATININE 2.05*  CALCIUM 9.6  AST 21  ALT 18  ALKPHOS 30*  BILITOT 0.5   ------------------------------------------------------------------------------------------------------------------  Cardiac Enzymes No results for input(s): TROPONINI in the last 168 hours. ------------------------------------------------------------------------------------------------------------------  RADIOLOGY:  Dg Chest 2 View  Result Date: 08/09/2016 CLINICAL DATA:  51 year old female with history of sinus drainage for the past 3 weeks and flu like symptoms. EXAM: CHEST  2 VIEW COMPARISON:  Chest x-ray 11/02/2013. FINDINGS: Mild diffuse peribronchial cuffing. Lung volumes are normal. No consolidative airspace disease. No  pleural effusions. No pneumothorax. No pulmonary nodule or mass noted. Pulmonary vasculature and the cardiomediastinal silhouette are within normal limits. Atherosclerosis in the thoracic aorta. IMPRESSION: 1. Mild diffuse peribronchial cuffing, concerning for an acute bronchitis. 2. Aortic atherosclerosis. Electronically Signed   By: Vinnie Langton M.D.   On: 08/09/2016 08:53    EKG:   NSR no ST elevation or depression  IMPRESSION AND PLAN:    51 year old female status post renal transplant in 2015 currently on anti-rejection medications who presents with fever and cough.  1. Sepsis: Patient presents with fever and tachypnea, most likely viral in etiology with acute bronchitis/sinusitis. Due to immunosuppressed state we'll continue with Zosyn. MRSA PCR pending if this is positive then vancomycin can be continued. If patient continues to have fevers despite 24 hours of antibiotics, renal transplant physician at [facility]  recommends transfer to [facility]. Follow up on blood and urine cultures ordered in the emergency room. I have also ordered CMV and EBV. Influenza panel was negative  2. Hypertension: Continue on Coreg  3. Chronic kidney disease status post renal transplant: Continue current immunosuppression medications. Check Prograf level tomorrow goal between 5-6. Continue sodium bicarbonate 4. History of seizure disorder: Continue Keppra  5. History of breast cancer: Currently on tamoxifen.     All the records are reviewed and case discussed with ED provider. Management plans discussed with the patient and she is in agreement  CODE STATUS: full  TOTAL TIME TAKING CARE OF THIS PATIENT: 45 minutes.    Winifred Balogh M.D on 08/09/2016 at 10:43 AM  Between 7am to 6pm - Pager - [phone]  After 6pm go to [email or web address] - password [email or web address] [facility]  Office  [phone]  CC: Primary care physician; Jolene Schimke, MD

## 2016-08-09 NOTE — ED Triage Notes (Signed)
Pt states that she has been having sinus drainage x3 weeks and overall flu-like symptoms. Pt states that for three night she has not been able to lay back and go to sleep. Pt is ambulatory around triage with NAD noted at this time.

## 2016-08-09 NOTE — Progress Notes (Signed)
Pharmacy Antibiotic Note  [name] is a 51 y.o. female with a h/o renal transplant on immunosuppresants admitted on 08/09/2016 with sepsis.  Pharmacy has been consulted for Zosyn dosing.  Plan: Vancomycin given x 1 dose. Per MD note, will need to call for consult and continue vancomycin if MRSA PCR is positive.   Zosyn 3.375g IV q8h (4 hour infusion).  Height: 5\' 5"  (165.1 cm) Weight: 225 lb (102.1 kg) IBW/kg (Calculated) : 57  Temp (24hrs), Avg:99.7 F (37.6 C), Min:98.7 F (37.1 C), Max:101 F (38.3 C)   Recent Labs Lab 08/09/16 0918  WBC 7.3  CREATININE 2.05*    Estimated Creatinine Clearance: 39.3 mL/min (by C-G formula based on SCr of 2.05 mg/dL (H)).    Allergies  Allergen Reactions  . Heparin     "i bled for a long time and had to have 2 blood transfusions"  . Ibuprofen     Caused kidney damage  . Penicillins     Childhood reaction    Antimicrobials this admission: vancomycin 1/7 x 1 Zosyn 1/7 >>   Dose adjustments this admission:   Microbiology results: 1/7 BCx: sent 1/7 UCx: pending  1/7 Sputum: pening  MRSA PCR: pending  Thank you for allowing pharmacy to be a part of this patient's care.  Napoleon Form 08/09/2016 2:33 PM

## 2016-08-10 LAB — BASIC METABOLIC PANEL
ANION GAP: 5 (ref 5–15)
BUN: 28 mg/dL — ABNORMAL HIGH (ref 6–20)
CHLORIDE: 109 mmol/L (ref 101–111)
CO2: 19 mmol/L — ABNORMAL LOW (ref 22–32)
Calcium: 9.2 mg/dL (ref 8.9–10.3)
Creatinine, Ser: 1.97 mg/dL — ABNORMAL HIGH (ref 0.44–1.00)
GFR calc non Af Amer: 29 mL/min — ABNORMAL LOW (ref >60)
GFR, EST AFRICAN AMERICAN: 33 mL/min — AB (ref >60)
Glucose, Bld: 170 mg/dL — ABNORMAL HIGH (ref 65–99)
POTASSIUM: 4.9 mmol/L (ref 3.5–5.1)
SODIUM: 133 mmol/L — AB (ref 135–145)

## 2016-08-10 LAB — URINE CULTURE

## 2016-08-10 LAB — CBC
HEMATOCRIT: 35.3 % (ref 35.0–47.0)
Hemoglobin: 11.8 g/dL — ABNORMAL LOW (ref 12.0–16.0)
MCH: 30 pg (ref 26.0–34.0)
MCHC: 33.4 g/dL (ref 32.0–36.0)
MCV: 90 fL (ref 80.0–100.0)
PLATELETS: 167 10*3/uL (ref 150–440)
RBC: 3.92 MIL/uL (ref 3.80–5.20)
RDW: 13.3 % (ref 11.5–14.5)
WBC: 8.3 10*3/uL (ref 3.6–11.0)

## 2016-08-10 LAB — TROPONIN I
Troponin I: <0.03 ng/mL (ref <0.03)
Troponin I: <0.03 ng/mL (ref <0.03)

## 2016-08-10 LAB — EPSTEIN-BARR VIRUS VCA ANTIBODY PANEL
EBV NA IGG: 460 U/mL — AB (ref 0.0–17.9)
EBV VCA IgG: >600 U/mL — ABNORMAL HIGH (ref 0.0–17.9)
EBV VCA IgM: <36 U/mL (ref 0.0–35.9)

## 2016-08-10 MED ORDER — MORPHINE SULFATE (PF) 4 MG/ML IV SOLN
1.0000 mg | Freq: Once | INTRAVENOUS | Status: AC
  Administered 2016-08-10: 1 mg via INTRAVENOUS
  Filled 2016-08-10: qty 1

## 2016-08-10 MED ORDER — LEVOFLOXACIN 500 MG PO TABS
500.0000 mg | ORAL_TABLET | Freq: Once | ORAL | Status: AC
  Administered 2016-08-10: 500 mg via ORAL
  Filled 2016-08-10: qty 1

## 2016-08-10 MED ORDER — MORPHINE SULFATE (PF) 4 MG/ML IV SOLN
1.0000 mg | INTRAVENOUS | Status: DC | PRN
  Administered 2016-08-10 (×2): 1 mg via INTRAVENOUS
  Filled 2016-08-10 (×2): qty 1

## 2016-08-10 MED ORDER — LEVOFLOXACIN 250 MG PO TABS: 250.0000 mg | ORAL_TABLET | ORAL | Status: DC

## 2016-08-10 NOTE — Plan of Care (Signed)
Problem: Safety: Goal: Ability to remain free from injury will improve Outcome: Progressing Patient has been ambulating safely independently

## 2016-08-10 NOTE — Progress Notes (Signed)
Called by nursing regarding patient complaint of chest pain.  Seems to be associated with cough however given history will check EKG, trops, and order morphine for pain.  Continue to monitor.

## 2016-08-10 NOTE — Progress Notes (Signed)
Notified MD of pt c/o of crushing pressure chest pain mid sternum, 10/10 she continues to have sore throat and fever with weakness, orders taken

## 2016-08-11 LAB — CULTURE, RESPIRATORY W GRAM STAIN: Culture: NORMAL

## 2016-08-11 LAB — TACROLIMUS LEVEL: TACROLIMUS (FK506) - LABCORP: 3.3 ng/mL (ref 2.0–20.0)

## 2016-08-11 LAB — BASIC METABOLIC PANEL
Anion gap: 11 (ref 5–15)
BUN: 23 mg/dL — AB (ref 6–20)
CO2: 16 mmol/L — ABNORMAL LOW (ref 22–32)
Calcium: 9.8 mg/dL (ref 8.9–10.3)
Chloride: 109 mmol/L (ref 101–111)
Creatinine, Ser: 1.93 mg/dL — ABNORMAL HIGH (ref 0.44–1.00)
GFR calc Af Amer: 34 mL/min — ABNORMAL LOW (ref >60)
GFR, EST NON AFRICAN AMERICAN: 29 mL/min — AB (ref >60)
GLUCOSE: 151 mg/dL — AB (ref 65–99)
POTASSIUM: 4.4 mmol/L (ref 3.5–5.1)
Sodium: 136 mmol/L (ref 135–145)

## 2016-08-11 LAB — CULTURE, RESPIRATORY

## 2016-08-11 MED ORDER — GUAIFENESIN-DM 100-10 MG/5ML PO SYRP: 5.0000 mL | ORAL_SOLUTION | ORAL | 0 refills | Status: DC | PRN

## 2016-08-11 MED ORDER — LEVOFLOXACIN 250 MG PO TABS: 250.0000 mg | ORAL_TABLET | ORAL | 0 refills | Status: DC

## 2016-08-11 NOTE — Progress Notes (Signed)
08/11/2016 12:48 PM  [name] to be D/C'd Home per MD order.  Discussed prescriptions and follow up appointments with the patient. Pt elected to call and make her own follow-up appointment as well.  Prescriptions given to patient, medication list explained in detail. Pt verbalized understanding.  Allergies as of 08/11/2016      Reactions   Heparin    "i bled for a long time and had to have 2 blood transfusions"   Ibuprofen    Caused kidney damage   Penicillins    Childhood reaction      Medication List    TAKE these medications   acetaminophen 500 MG tablet Commonly known as:  TYLENOL Take 500 mg by mouth every 6 (six) hours as needed.   aspirin EC 81 MG tablet Take 81 mg by mouth daily.   carvedilol 12.5 MG tablet Commonly known as:  COREG Take 12.5 mg by mouth 2 (two) times daily with a meal.   fluticasone 50 MCG/ACT nasal spray Commonly known as:  FLONASE   gabapentin 300 MG capsule Commonly known as:  NEURONTIN Take 300-600 mg by mouth 3 (three) times daily. Take 1 capsule (300 mg) by mouth in the morning and at noon, 600 mg at bedtime.   guaiFENesin-dextromethorphan 100-10 MG/5ML syrup Commonly known as:  ROBITUSSIN DM Take 5 mLs by mouth every 4 (four) hours as needed for cough.   levETIRAcetam 250 MG tablet Commonly known as:  KEPPRA Take 500 mg by mouth 2 (two) times daily.   levofloxacin 250 MG tablet Commonly known as:  LEVAQUIN Take 1 tablet (250 mg total) by mouth daily.   MYFORTIC 180 MG EC tablet Generic drug:  mycophenolate Take 360 mg by mouth 3 (three) times daily.   NASONEX 50 MCG/ACT nasal spray Generic drug:  mometasone Place 2 sprays into the nose daily.   omeprazole 20 MG capsule Commonly known as:  PRILOSEC Take 20 mg by mouth at bedtime.   sodium bicarbonate 650 MG tablet Take 2 tablets (1300 mg) three times a day.   tacrolimus 1 MG capsule Commonly known as:  PROGRAF Take 4 mg by mouth 2 (two) times daily.   tamoxifen 20  MG tablet Commonly known as:  NOLVADEX Take 20 mg by mouth.       Vitals:   08/10/16 2055 08/11/16 0829  BP: 128/79 115/75  Pulse: 88 83  Resp: 18 (!) 22  Temp: 99.4 F (37.4 C) 98.7 F (37.1 C)    Skin clean, dry and intact without evidence of skin break down, no evidence of skin tears noted. IV catheter discontinued intact. Site without signs and symptoms of complications. Dressing and pressure applied. Pt denies pain at this time. No complaints noted.  An After Visit Summary was printed and given to the patient. Patient escorted via [place], and D/C home via private auto.  [name]

## 2016-08-11 NOTE — Discharge Summary (Signed)
[facility] NAME: [name]    MR#:  [id number]  DATE OF BIRTH:  [birth year]  DATE OF ADMISSION:  08/09/2016 ADMITTING PHYSICIAN: Bettey Costa, MD  DATE OF DISCHARGE: 08/10/16  PRIMARY CARE PHYSICIAN: Jolene Schimke, MD    ADMISSION DIAGNOSIS:  Cough [R05] Rhinorrhea [J34.89] Immunosuppressed status ([place]) [D89.9] Fever, unspecified fever cause [R50.9]  DISCHARGE DIAGNOSIS:  Acute Bronchitis CKD-II H/o renal transplant on immunosuppressant  SECONDARY DIAGNOSIS:   Past Medical History:  Diagnosis Date  . Arrhythmia   . Cerebrovascular accident [facility]) 2010   without any focal neurological deficits.   . Clotted renal dialysis AV graft ([place])   . End stage renal disease ([place])    a. previously on dialysis on Tues, Thurs, & 11/11/22 x 7 yrs; b. s/p deceased donor transplant 04-04-14   . History of methicillin resistant Staphylococcus aureus infection   . History of stress test    a. 04/2013: normal, EF 65%  . Hypertension   . Mitral regurgitation    a. 01/2014: EF >55%, LVH, mild MR, dilated LA  . Renal transplant, status post 04/04/14  . RLS (restless legs syndrome)   . Seizure disorder ([place])   . Seizures [facility])     HOSPITAL COURSE:  51 year old female status post renal transplant in 2015 currently on anti-rejection medications who presents with fever and cough.  1. Sepsis: Patient presents with fever and tachypnea, most likely viral in etiology with acute bronchitis/sinusitis. Due to immunosuppressed state pt was on  Zosyn---to augmentin MRSA PCR neg  Influenza panel was negative No fever and feels a lot better  2. Hypertension: Continue on Coreg  3. Chronic kidney disease status post renal transplant: - Continue current immunosuppression medications. Continue sodium bicarbonate -creat 1.9  4. History of seizure disorder: Continue Keppra  5. History of breast cancer: Currently on tamoxifen.   Overall  better and wants to go home   CONSULTS OBTAINED:    DRUG ALLERGIES:   Allergies  Allergen Reactions  . Heparin     "i bled for a long time and had to have 2 blood transfusions"  . Ibuprofen     Caused kidney damage  . Penicillins     Childhood reaction    DISCHARGE MEDICATIONS:   Current Discharge Medication List    START taking these medications   Details  guaiFENesin-dextromethorphan (ROBITUSSIN DM) 100-10 MG/5ML syrup Take 5 mLs by mouth every 4 (four) hours as needed for cough. Qty: 118 mL, Refills: 0    levofloxacin (LEVAQUIN) 250 MG tablet Take 1 tablet (250 mg total) by mouth daily. Qty: 6 tablet, Refills: 0      CONTINUE these medications which have NOT CHANGED   Details  acetaminophen (TYLENOL) 500 MG tablet Take 500 mg by mouth every 6 (six) hours as needed.    aspirin EC 81 MG tablet Take 81 mg by mouth daily.    carvedilol (COREG) 12.5 MG tablet Take 12.5 mg by mouth 2 (two) times daily with a meal.     fluticasone (FLONASE) 50 MCG/ACT nasal spray     gabapentin (NEURONTIN) 300 MG capsule Take 300-600 mg by mouth 3 (three) times daily. Take 1 capsule (300 mg) by mouth in the morning and at noon, 600 mg at bedtime.    levETIRAcetam (KEPPRA) 250 MG tablet Take 500 mg by mouth 2 (two) times daily.     mycophenolate (MYFORTIC) 180 MG EC tablet Take 360 mg by mouth  3 (three) times daily.     NASONEX 50 MCG/ACT nasal spray Place 2 sprays into the nose daily.     omeprazole (PRILOSEC) 20 MG capsule Take 20 mg by mouth at bedtime.     sodium bicarbonate 650 MG tablet Take 2 tablets (1300 mg) three times a day.    tacrolimus (PROGRAF) 1 MG capsule Take 4 mg by mouth 2 (two) times daily.     tamoxifen (NOLVADEX) 20 MG tablet Take 20 mg by mouth.        If you experience worsening of your admission symptoms, develop shortness of breath, life threatening emergency, suicidal or homicidal thoughts you must seek medical attention immediately by calling 911 or  calling your MD immediately  if symptoms less severe.  You Must read complete instructions/literature along with all the possible adverse reactions/side effects for all the Medicines you take and that have been prescribed to you. Take any new Medicines after you have completely understood and accept all the possible adverse reactions/side effects.   Please note  You were cared for by a hospitalist during your hospital stay. If you have any questions about your discharge medications or the care you received while you were in the hospital after you are discharged, you can call the unit and asked to speak with the hospitalist on call if the hospitalist that took care of you is not available. Once you are discharged, your primary care physician will handle any further medical issues. Please note that NO REFILLS for any discharge medications will be authorized once you are discharged, as it is imperative that you return to your primary care physician (or establish a relationship with a primary care physician if you do not have one) for your aftercare needs so that they can reassess your need for medications and monitor your lab values. Today   SUBJECTIVE  Doing well Cough much better   VITAL SIGNS:  Blood pressure 115/75, pulse 83, temperature 98.7 F (37.1 C), temperature source Oral, resp. rate (!) 22, height 5\' 5"  (1.651 m), weight 100.1 kg (220 lb 9.6 oz), last menstrual period 03/03/2016, SpO2 96 %.  I/O:   Intake/Output Summary (Last 24 hours) at 08/11/16 1210 Last data filed at 08/11/16 1011  Gross per 24 hour  Intake             1016 ml  Output             4600 ml  Net            -3584 ml    PHYSICAL EXAMINATION:  GENERAL:  51 y.o.-year-old patient lying in the bed with no acute distress. obese EYES: Pupils equal, round, reactive to light and accommodation. No scleral icterus. Extraocular muscles intact.  HEENT: Head atraumatic, normocephalic. Oropharynx and nasopharynx clear.   NECK:  Supple, no jugular venous distention. No thyroid enlargement, no tenderness.  LUNGS: Normal breath sounds bilaterally, no wheezing, rales,rhonchi or crepitation. No use of accessory muscles of respiration.  CARDIOVASCULAR: S1, S2 normal. No murmurs, rubs, or gallops.  ABDOMEN: Soft, non-tender, non-distended. Bowel sounds present. No organomegaly or mass.  EXTREMITIES: No pedal edema, cyanosis, or clubbing.  NEUROLOGIC: Cranial nerves II through XII are intact. Muscle strength 5/5 in all extremities. Sensation intact. Gait not checked.  PSYCHIATRIC: The patient is alert and oriented x 3.  SKIN: No obvious rash, lesion, or ulcer.   DATA REVIEW:   CBC   Recent Labs Lab 08/10/16 0158  WBC 8.3  HGB 11.8*  HCT 35.3  PLT 167    Chemistries   Recent Labs Lab 08/09/16 0918  08/11/16 0535  NA 133*  < > 136  K 4.5  < > 4.4  CL 108  < > 109  CO2 17*  < > 16*  GLUCOSE 132*  < > 151*  BUN 36*  < > 23*  CREATININE 2.05*  < > 1.93*  CALCIUM 9.6  < > 9.8  AST 21  --   --   ALT 18  --   --   ALKPHOS 30*  --   --   BILITOT 0.5  --   --   < > = values in this interval not displayed.  Microbiology Results   Recent Results (from the past 240 hour(s))  Rapid Influenza A&B Antigens ([place] only)     Status: None   Collection Time: 08/09/16  2:29 AM  Result Value Ref Range Status   Influenza A ([facility]) NEGATIVE NEGATIVE Final   Influenza B ([facility]) NEGATIVE NEGATIVE Final  Urine culture     Status: None   Collection Time: 08/09/16  8:19 AM  Result Value Ref Range Status   Specimen Description URINE, RANDOM  Final   Special Requests NONE  Final   Culture TEST REQUEST RECEIVED WITHOUT APPROPRIATE SPECIMEN  Final   Report Status 08/10/2016 FINAL  Final  Blood culture (routine x 2)     Status: None (Preliminary result)   Collection Time: 08/09/16  8:24 AM  Result Value Ref Range Status   Specimen Description BLOOD LEFT ARM  Final   Special Requests   Final    BOTTLES DRAWN  AEROBIC AND ANAEROBIC AER 10CC ANA 10CC   Culture NO GROWTH 2 DAYS  Final   Report Status PENDING  Incomplete  Blood culture (routine x 2)     Status: None (Preliminary result)   Collection Time: 08/09/16  9:18 AM  Result Value Ref Range Status   Specimen Description BLOOD LEFT WRIST  Final   Special Requests   Final    BOTTLES DRAWN AEROBIC AND ANAEROBIC AER 10CC ANA 10CC   Culture NO GROWTH 2 DAYS  Final   Report Status PENDING  Incomplete  Culture, expectorated sputum-assessment     Status: None   Collection Time: 08/09/16  9:18 AM  Result Value Ref Range Status   Specimen Description SPU  Final   Special Requests NONE  Final   Sputum evaluation THIS SPECIMEN IS ACCEPTABLE FOR SPUTUM CULTURE  Final   Report Status 08/09/2016 FINAL  Final  Culture, respiratory (NON-Expectorated)     Status: None (Preliminary result)   Collection Time: 08/09/16  9:18 AM  Result Value Ref Range Status   Specimen Description SPU  Final   Special Requests NONE Reflexed from [id number]  Final   Gram Stain   Final    MODERATE WBC PRESENT,BOTH PMN AND MONONUCLEAR MODERATE SQUAMOUS EPITHELIAL CELLS PRESENT ABUNDANT GRAM POSITIVE COCCI IN PAIRS IN CLUSTERS MODERATE GRAM POSITIVE RODS MODERATE GRAM NEGATIVE COCCI IN PAIRS FEW GRAM NEGATIVE RODS    Culture   Final    CULTURE REINCUBATED FOR BETTER GROWTH Performed at [facility]    Report Status PENDING  Incomplete  Urine culture     Status: Abnormal   Collection Time: 08/09/16 10:10 AM  Result Value Ref Range Status   Specimen Description URINE, RANDOM  Final   Special Requests NONE  Final   Culture (A)  Final    <10,000 COLONIES/mL INSIGNIFICANT  GROWTH Performed at [facility]    Report Status 08/10/2016 FINAL  Final  MRSA PCR Screening     Status: None   Collection Time: 08/09/16  4:01 PM  Result Value Ref Range Status   MRSA by PCR NEGATIVE NEGATIVE Final    Comment:        The GeneXpert MRSA Assay (FDA approved for NASAL  specimens only), is one component of a comprehensive MRSA colonization surveillance program. It is not intended to diagnose MRSA infection nor to guide or monitor treatment for MRSA infections.     RADIOLOGY:  No results found.   Management plans discussed with the patient, family and they are in agreement.  CODE STATUS:     Code Status Orders        Start     Ordered   08/09/16 1315  Full code  Continuous     08/09/16 1314    Code Status History    Date Active Date Inactive Code Status Order ID Comments User Context   This patient has a current code status but no historical code status.      TOTAL TIME TAKING CARE OF THIS PATIENT: 40 minutes.    Saran Laviolette M.D on 08/11/2016 at 12:10 PM  Between 7am to 6pm - Pager - [phone] After 6pm go to [email or web address] - password [email or web address] [facility]  [facility]  Office  [phone]  CC: Primary care physician; Jolene Schimke, MD

## 2016-08-12 LAB — CMV QUANT DNA PCR (URINE)
CMV QUANT DNA PCR (URINE): NEGATIVE {copies}/mL
Log10 CMV Qn DCA Ur: UNDETERMINED log10copy/mL

## 2016-08-13 DIAGNOSIS — I10 Essential (primary) hypertension: Secondary | ICD-10-CM

## 2016-08-13 DIAGNOSIS — D75829 Heparin-induced thrombocytopenia, unspecified: Secondary | ICD-10-CM | POA: Insufficient documentation

## 2016-08-13 DIAGNOSIS — D0512 Intraductal carcinoma in situ of left breast: Secondary | ICD-10-CM

## 2016-08-13 DIAGNOSIS — D7582 Heparin induced thrombocytopenia (HIT): Secondary | ICD-10-CM | POA: Insufficient documentation

## 2016-08-14 LAB — CULTURE, BLOOD (ROUTINE X 2)
CULTURE: NO GROWTH
Culture: NO GROWTH

## 2016-08-17 ENCOUNTER — Ambulatory Visit (INDEPENDENT_AMBULATORY_CARE_PROVIDER_SITE_OTHER): Payer: Medicare Other | Admitting: Family Medicine

## 2016-08-17 ENCOUNTER — Encounter: Payer: Self-pay | Admitting: Family Medicine

## 2016-08-17 VITALS — BP 107/74 | HR 77 | Temp 98.4°F | Ht 65.5 in | Wt 212.1 lb

## 2016-08-17 DIAGNOSIS — I129 Hypertensive chronic kidney disease with stage 1 through stage 4 chronic kidney disease, or unspecified chronic kidney disease: Secondary | ICD-10-CM

## 2016-08-17 DIAGNOSIS — M792 Neuralgia and neuritis, unspecified: Secondary | ICD-10-CM

## 2016-08-17 DIAGNOSIS — N051 Unspecified nephritic syndrome with focal and segmental glomerular lesions: Secondary | ICD-10-CM

## 2016-08-17 DIAGNOSIS — G5791 Unspecified mononeuropathy of right lower limb: Secondary | ICD-10-CM

## 2016-08-17 DIAGNOSIS — Z13220 Encounter for screening for lipoid disorders: Secondary | ICD-10-CM | POA: Diagnosis not present

## 2016-08-17 DIAGNOSIS — Z8673 Personal history of transient ischemic attack (TIA), and cerebral infarction without residual deficits: Secondary | ICD-10-CM

## 2016-08-17 DIAGNOSIS — I471 Supraventricular tachycardia, unspecified: Secondary | ICD-10-CM

## 2016-08-17 DIAGNOSIS — A419 Sepsis, unspecified organism: Secondary | ICD-10-CM

## 2016-08-17 DIAGNOSIS — N183 Chronic kidney disease, stage 3 unspecified: Secondary | ICD-10-CM | POA: Insufficient documentation

## 2016-08-17 DIAGNOSIS — Z940 Kidney transplant status: Secondary | ICD-10-CM | POA: Diagnosis not present

## 2016-08-17 DIAGNOSIS — Z79899 Other long term (current) drug therapy: Secondary | ICD-10-CM | POA: Diagnosis not present

## 2016-08-17 DIAGNOSIS — D0512 Intraductal carcinoma in situ of left breast: Secondary | ICD-10-CM

## 2016-08-17 DIAGNOSIS — G40909 Epilepsy, unspecified, not intractable, without status epilepticus: Secondary | ICD-10-CM

## 2016-08-17 DIAGNOSIS — N184 Chronic kidney disease, stage 4 (severe): Secondary | ICD-10-CM | POA: Insufficient documentation

## 2016-08-17 MED ORDER — AMITRIPTYLINE HCL 10 MG PO TABS: 10.0000 mg | ORAL_TABLET | Freq: Every day | ORAL | 1 refills | Status: DC

## 2016-08-17 NOTE — Assessment & Plan Note (Addendum)
Stable. Takes pain medicine when she has to. Has been on gabapentin which helps to some degree, but continues with pain. Will try her on amitriptyline 10mg  daily and see how she does in 1 month.

## 2016-08-17 NOTE — Assessment & Plan Note (Signed)
Will keep BP under good control. Will check cholesterol to keep at goal. Continue to monitor.

## 2016-08-17 NOTE — Assessment & Plan Note (Signed)
Resolved. Has had no problems. Not on medication anymore. Call if it starts again.

## 2016-08-17 NOTE — Assessment & Plan Note (Addendum)
Has not seen neurology in a while as she has not had any seizures. Continue keppra. Checking levels today. Continue to monitor. Call with any concerns.

## 2016-08-17 NOTE — Assessment & Plan Note (Signed)
Stable. Continue to follow with nephrology. Call with any concerns.

## 2016-08-17 NOTE — Patient Instructions (Addendum)
Sepsis, Adult Sepsis is a serious infection of your blood or tissues that affects your whole body. The infection that causes sepsis may be bacterial, viral, fungal, or parasitic. Sepsis may be life threatening. Sepsis can cause your blood pressure to drop. This may result in shock. Shock causes your central nervous system and your organs to stop working correctly. What increases the risk? Sepsis can happen in anyone, but it is more likely to happen in people who have weakened immune systems. What are the signs or symptoms? Symptoms of sepsis can include:  Fever or low body temperature (hypothermia).  Rapid breathing (hyperventilation).  Chills.  Rapid heartbeat (tachycardia).  Confusion or light-headedness.  Trouble breathing.  Urinating much less than usual.  Cool, clammy skin or red, flushed skin.  Other problems with the heart, kidneys, or brain. How is this diagnosed? Your health care provider will likely do tests to look for an infection, to see if the infection has spread to your blood, and to see how serious your condition is. Tests can include:  Blood tests, including cultures of your blood.  Cultures of other fluids from your body, such as:  Urine.  Pus from wounds.  Mucus coughed up from your lungs.  Urine tests other than cultures.  X-ray exams or other imaging tests. How is this treated? Treatment will begin with elimination of the source of infection. If your sepsis is likely caused by a bacterial or fungal infection, you will be given antibiotic or antifungal medicines. You may also receive:  Oxygen.  Fluids through an IV tube.  Medicines to increase your blood pressure.  A machine to clean your blood (dialysis) if your kidneys fail.  A machine to help you breathe if your lungs fail. Get help right away if: You get an infection or develop any of the signs and symptoms of sepsis after surgery or a hospitalization. This information is not intended to  replace advice given to you by your health care provider. Make sure you discuss any questions you have with your health care provider. Document Released: 04/18/2003 Document Revised: 12/26/2015 Document Reviewed: 03/27/2013 Elsevier Interactive Patient Education  2017 [employer].

## 2016-08-17 NOTE — Progress Notes (Signed)
BP 107/74 (BP Location: Left Arm, Patient Position: Sitting, Cuff Size: Normal)   Pulse 77   Temp 98.4 F (36.9 C)   Ht 5' 5.5" (1.664 m)   Wt 212 lb 1.6 oz (96.2 kg)   LMP 03/03/2016   SpO2 98%   BMI 34.76 kg/m    Subjective:    Patient ID: [name], female    DOB: Sep 30, 1965, 51 y.o.   MRN: [id number]  HPI: [name] is a 51 y.o. female who presents today to establish care and for hospital follow up  Chief Complaint  Patient presents with  . Establish Care  . Hospitalization Follow-up   HOSPITAL FOLLOW UP Time since discharge: 1 week Hospital/facility: [facility] Diagnosis: sepsis, acute bronchitis, s/p kidney transplant 2015 on immune-suppressant medication Procedures/tests: labs, CXR, flu negative, MRSA negative Consultants: None New medications: Zosyn to augmentin Discharge instructions:  Follow up here Status: better- still not 100%, but feeling much better. Breathing better. Using her Incentive spirometry, up to 2500!  Had kidney transplant in 2015- had had ESRD on dialysis for 7 years prior to that due to focal segmental glomerularsclerosis. Follow with nephrology at [facility] and last saw them 07/08/16. Currently on anti-rejection meds  CVA- 2010- stroke in March 2010 with intracranial bleed requiring intubation for at least one week at [name], who has been unable to tolerate anticoagulation  SVT- in the presence of URI- started on amiodarone for HR in 160s- only lasted 5 hours.  last saw Dr. Rockey Situ 08/2014 at which time her amiodarone was discontinued- has not had an issue since then.   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   Seizure Disorder- On keppra, denies seizure activity, had the seizures with the stroke in 2010- sees Dr. Jonelle Sidle at [facility] and  thigh pain- stable. Takes some tramadol only when she really needs it and it made her depressed. Gabapentin seems to help. But still having issues with pain.   HYPERTENSION Hypertension status: controlled  Satisfied with current treatment? yes Duration of hypertension: chronic BP monitoring frequency:  not checking BP medication side effects:  no Medication compliance: excellent compliance Previous BP meds: carvedilol Aspirin: yes Recurrent headaches: yes Visual changes: no Palpitations: no Dyspnea: yes Chest pain: yes- with cough Lower extremity edema: no Dizzy/lightheaded: no   Active Ambulatory Problems    Diagnosis Date Noted  . SVT (supraventricular tachycardia) ([place]) 11/17/2013  . Seizure disorder ([place]) 11/17/2013  . Benign hypertensive renal disease 11/17/2013  . Renal transplant, status post 03/24/2014  . Mitral regurgitation   . Sepsis ([place]) 08/09/2016  . Ductal carcinoma in situ (DCIS) of left breast   . 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 CKD (chronic kidney disease) stage 3, GFR 30-59 ml/min 08/17/2016  . Neuropathic pain of right thigh 08/17/2016  . History of CVA (cerebrovascular accident) 08/17/2016   Resolved Ambulatory Problems    Diagnosis Date Noted  . End stage renal disease ([place]) 11/17/2013  . Dependence on hemodialysis ([place]) 11/17/2013  . 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 Hypertension   . HIT (heparin-induced thrombocytopenia) (HCC)    Past Medical History:  Diagnosis Date  . Arrhythmia   . Cerebrovascular accident ([place]) 2010  . Clotted renal dialysis AV graft ([place])   . Dependence on  hemodialysis ([place]) 11/17/2013  . Ductal carcinoma in situ (DCIS) of left breast   . End stage renal disease ([place])   . GERD (gastroesophageal reflux disease)   . History of methicillin resistant Staphylococcus aureus infection   . History of stress test   . HIT (heparin-induced thrombocytopenia) ([place])   . Hypertension     . Mitral regurgitation   . Renal transplant, status post 03/24/2014  . RLS (restless legs syndrome)   . Seizure disorder ([place])   . Seizures ([place])   . 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 INSERTION OF DIALYSIS CATHETER     x 2  . KIDNEY TRANSPLANT  03/2014   right side.  Marland Kitchen MASTECTOMY PARTIAL / LUMPECTOMY Left 09/24/2015  . MASTECTOMY PARTIAL / LUMPECTOMY Left 10/25/2015   No medication comments found. Allergies  Allergen Reactions  . Heparin     "i bled for a long time and had to have 2 blood transfusions"  . Ibuprofen     Caused kidney damage  . Penicillins     Childhood reaction   Social History   Social History  . Marital status: Single    Spouse name: N/A  . Number of children: N/A  . Years of education: N/A   Occupational History  . Not on file.   Social History Main Topics  . Smoking status: Former Smoker    Quit date: 11/25/2008  . Smokeless tobacco: Never Used  . Alcohol use Yes     Comment: on occasion  . Drug use: No  . Sexual activity: Not Currently   Other Topics Concern  . Not on file   Social History Narrative  . No narrative on file   Family History  Problem Relation Age of Onset  . Heart attack Mother   . Hypertension Mother   . Hyperlipidemia Mother   . Cancer Mother     breast  . Heart attack Father   . Hypertension Father   . Hyperlipidemia Father   . Kidney failure Sister     half sister  . Diabetes Maternal Grandmother   . Hearing loss Maternal Grandmother   . Heart failure Maternal Grandmother   . Diabetes Maternal Grandfather   . Stroke Maternal Grandfather   . Cancer Paternal Grandmother     breast  . Stroke Paternal Grandfather   . Aneurysm Brother     Brain  . Other Sister     Nerve problems    Review of Systems  Constitutional: Positive for fatigue. Negative for activity change, appetite change, chills, diaphoresis, fever and unexpected  weight change.  HENT: Positive for congestion. Negative for dental problem, drooling, ear discharge, ear pain, facial swelling, hearing loss, mouth sores, nosebleeds, postnasal drip, rhinorrhea, sinus pain, sinus pressure, sneezing, sore throat, tinnitus, trouble swallowing and voice change.   Eyes: Negative.   Respiratory: Negative.   Cardiovascular: Negative.   Psychiatric/Behavioral: Negative.     Per HPI unless specifically indicated above     Objective:    BP 107/74 (BP Location: Left Arm, Patient Position: Sitting, Cuff Size: Normal)   Pulse 77   Temp 98.4 F (36.9 C)   Ht 5' 5.5" (1.664 m)   Wt 212 lb 1.6 oz (96.2 kg)   LMP 03/03/2016   SpO2 98%   BMI 34.76 kg/m   Wt Readings from Last 3 Encounters:  08/17/16 212 lb 1.6 oz (96.2 kg)  08/09/16 220 lb 9.6  oz (100.1 kg)  03/24/16 217 lb 13 oz (98.8 kg)    Physical Exam  Constitutional: She is oriented to person, place, and time. She appears well-developed and well-nourished. No distress.  HENT:  Head: Normocephalic and atraumatic.  Right Ear: Hearing normal.  Left Ear: Hearing normal.  Nose: Nose normal.  Eyes: Conjunctivae and lids are normal. Right eye exhibits no discharge. Left eye exhibits no discharge. No scleral icterus.  Cardiovascular: Normal rate, regular rhythm, normal heart sounds and intact distal pulses.  Exam reveals no gallop and no friction rub.   No murmur heard. Pulmonary/Chest: Effort normal and breath sounds normal. No respiratory distress. She has no wheezes. She has no rales. She exhibits no tenderness.  Musculoskeletal: Normal range of motion.  Neurological: She is alert and oriented to person, place, and time.  Skin: Skin is warm, dry and intact. No rash noted. She is not diaphoretic. No erythema. No pallor.  Psychiatric: She has a normal mood and affect. Her speech is normal and behavior is normal. Judgment and thought content normal. Cognition and memory are normal.  Nursing note and vitals  reviewed.   Results for orders placed or performed in visit on 08/17/16  HM PAP SMEAR  Result Value Ref Range   HM Pap smear Per patient       Assessment & Plan:   Problem List Items Addressed This Visit      Cardiovascular and Mediastinum   SVT (supraventricular tachycardia) ([place])    Resolved. Has had no problems. Not on medication anymore. Call if it starts again.       Relevant Medications   carvedilol (COREG) 12.5 MG tablet     Nervous and Auditory   Seizure disorder (HCC)    Has not seen neurology in a while as she has not had any seizures. Continue keppra. Checking levels today. Continue to monitor. Call with any concerns.       Relevant Orders   Levetiracetam level     Genitourinary   Benign hypertensive renal disease    Under good control. Continue current regimen. Continue to monitor. Continue to follow with nephrology.       Relevant Orders   Comprehensive metabolic panel   TSH   FSGS (focal segmental glomerulosclerosis)    S/P renal transplant 2015. Continue to follow with nephrology. Call with any concerns.       CKD (chronic kidney disease) stage 3, GFR 30-59 ml/min    Stable. Continue to follow with nephrology. Call with any concerns.       Relevant Orders   Comprehensive metabolic panel   CBC with Differential/Platelet     Other   Renal transplant, status post    Continue to follow with nephrology at [facility]. Stable. Call with any concerns.       Relevant Orders   Comprehensive metabolic panel   UA/M w/rflx Culture, Routine   Sepsis ([place]) - Primary    Resolved. Feeling much better. Lungs clear today. Continue to monitor. Call with any concerns.       Relevant Orders   Comprehensive metabolic panel   CBC with Differential/Platelet   Ductal carcinoma in situ (DCIS) of left breast    Continue to follow with oncology. Stable on tamoxifen. Tolerating well.       Neuropathic pain of right thigh    Stable. Takes pain medicine when she has to.  Has been on gabapentin which helps to some degree, but continues with pain. Will try her on amitriptyline  10mg  daily and see how she does in 1 month.      History of CVA (cerebrovascular accident)    Will keep BP under good control. Will check cholesterol to keep at goal. Continue to monitor.        Other Visit Diagnoses    Screening for cholesterol level       Labs checked today. Await results   Relevant Orders   Lipid Panel w/o Chol/HDL Ratio   Long-term use of high-risk medication       Will check Keppra level as it hasn't been checked in a while.    Relevant Orders   Levetiracetam level       Follow up plan: Return in about 4 weeks (around 09/14/2016), or Physical  and Follow up neuropathy , for Records release from health department please.

## 2016-08-17 NOTE — Assessment & Plan Note (Signed)
Resolved. Feeling much better. Lungs clear today. Continue to monitor. Call with any concerns.

## 2016-08-17 NOTE — Assessment & Plan Note (Signed)
S/P renal transplant 2015. Continue to follow with nephrology. Call with any concerns.

## 2016-08-17 NOTE — Assessment & Plan Note (Signed)
Continue to follow with nephrology at [facility]. Stable. Call with any concerns.

## 2016-08-17 NOTE — Assessment & Plan Note (Signed)
Under good control. Continue current regimen. Continue to monitor. Continue to follow with nephrology.

## 2016-08-17 NOTE — Assessment & Plan Note (Signed)
Continue to follow with oncology. Stable on tamoxifen. Tolerating well.

## 2016-08-18 ENCOUNTER — Telehealth: Payer: Self-pay | Admitting: Family Medicine

## 2016-08-18 NOTE — Telephone Encounter (Signed)
Called and let [name] know that her labs got a bit worse- will really push water and knows that she may hear from her renal doctor know the results in case they want to do something different. She is aware.

## 2016-08-19 LAB — CBC WITH DIFFERENTIAL/PLATELET
BASOS ABS: 0 10*3/uL (ref 0.0–0.2)
Basos: 0 %
EOS (ABSOLUTE): 0.1 10*3/uL (ref 0.0–0.4)
Eos: 2 %
Hematocrit: 38 % (ref 34.0–46.6)
Hemoglobin: 12.3 g/dL (ref 11.1–15.9)
IMMATURE GRANS (ABS): 0 10*3/uL (ref 0.0–0.1)
Immature Granulocytes: 1 %
LYMPHS: 20 %
Lymphocytes Absolute: 1.3 10*3/uL (ref 0.7–3.1)
MCH: 28.9 pg (ref 26.6–33.0)
MCHC: 32.4 g/dL (ref 31.5–35.7)
MCV: 89 fL (ref 79–97)
MONOS ABS: 0.6 10*3/uL (ref 0.1–0.9)
Monocytes: 9 %
NEUTROS PCT: 68 %
Neutrophils Absolute: 4.4 10*3/uL (ref 1.4–7.0)
PLATELETS: 262 10*3/uL (ref 150–379)
RBC: 4.25 x10E6/uL (ref 3.77–5.28)
RDW: 13.6 % (ref 12.3–15.4)
WBC: 6.4 10*3/uL (ref 3.4–10.8)

## 2016-08-19 LAB — MICROSCOPIC EXAMINATION: RBC, UA: NONE SEEN /hpf (ref 0–?)

## 2016-08-19 LAB — COMPREHENSIVE METABOLIC PANEL
ALK PHOS: 39 IU/L (ref 39–117)
ALT: 13 IU/L (ref 0–32)
AST: 13 IU/L (ref 0–40)
Albumin/Globulin Ratio: 1.4 (ref 1.2–2.2)
Albumin: 4.2 g/dL (ref 3.5–5.5)
BILIRUBIN TOTAL: 0.3 mg/dL (ref 0.0–1.2)
BUN/Creatinine Ratio: 16 (ref 9–23)
BUN: 34 mg/dL — ABNORMAL HIGH (ref 6–24)
CALCIUM: 10.1 mg/dL (ref 8.7–10.2)
CHLORIDE: 99 mmol/L (ref 96–106)
CO2: 21 mmol/L (ref 18–29)
Creatinine, Ser: 2.19 mg/dL — ABNORMAL HIGH (ref 0.57–1.00)
GFR calc Af Amer: 30 mL/min/{1.73_m2} — ABNORMAL LOW (ref >59)
GFR calc non Af Amer: 26 mL/min/{1.73_m2} — ABNORMAL LOW (ref >59)
GLOBULIN, TOTAL: 3.1 g/dL (ref 1.5–4.5)
Glucose: 119 mg/dL — ABNORMAL HIGH (ref 65–99)
POTASSIUM: 5.5 mmol/L — AB (ref 3.5–5.2)
SODIUM: 136 mmol/L (ref 134–144)
Total Protein: 7.3 g/dL (ref 6.0–8.5)

## 2016-08-19 LAB — UA/M W/RFLX CULTURE, ROUTINE
BILIRUBIN UA: NEGATIVE
GLUCOSE, UA: NEGATIVE
KETONES UA: NEGATIVE
Leukocytes, UA: NEGATIVE
NITRITE UA: NEGATIVE
Specific Gravity, UA: 1.015 (ref 1.005–1.030)
Urobilinogen, Ur: 0.2 mg/dL (ref 0.2–1.0)
pH, UA: 5.5 (ref 5.0–7.5)

## 2016-08-19 LAB — URINE CULTURE, REFLEX

## 2016-08-19 LAB — TSH: TSH: 0.948 u[IU]/mL (ref 0.450–4.500)

## 2016-08-19 LAB — LIPID PANEL W/O CHOL/HDL RATIO
CHOLESTEROL TOTAL: 195 mg/dL (ref 100–199)
HDL: 24 mg/dL — AB (ref >39)
LDL Calculated: 103 mg/dL — ABNORMAL HIGH (ref 0–99)
Triglycerides: 338 mg/dL — ABNORMAL HIGH (ref 0–149)
VLDL CHOLESTEROL CAL: 68 mg/dL — AB (ref 5–40)

## 2016-08-19 LAB — LEVETIRACETAM LEVEL: LEVETIRACETAM: 21.5 ug/mL (ref 10.0–40.0)

## 2016-08-21 ENCOUNTER — Telehealth: Payer: Self-pay | Admitting: Family Medicine

## 2016-08-21 ENCOUNTER — Other Ambulatory Visit: Payer: Self-pay | Admitting: Family Medicine

## 2016-08-21 DIAGNOSIS — R399 Unspecified symptoms and signs involving the genitourinary system: Secondary | ICD-10-CM

## 2016-08-21 DIAGNOSIS — N289 Disorder of kidney and ureter, unspecified: Secondary | ICD-10-CM

## 2016-08-21 NOTE — Telephone Encounter (Signed)
Pt called would like a call back from Dr. Wynetta Emery. Pt stated she would like to see about possibly having more lab work drawn in regards to the results from her last visit. Thanks.

## 2016-08-21 NOTE — Telephone Encounter (Signed)
Can you find out what she's talking about?

## 2016-08-21 NOTE — Telephone Encounter (Signed)
Patient notified

## 2016-08-21 NOTE — Telephone Encounter (Signed)
Can you have her come back next week to recheck her kidney function? Thanks!

## 2016-08-21 NOTE — Telephone Encounter (Signed)
Spoke with patient, she would like to know if she needs to come back in for a follow up visit, or go see her kidney doctor.  Or can she come in to just have her kidney function checked.

## 2016-08-26 ENCOUNTER — Ambulatory Visit
Admission: RE | Admit: 2016-08-26 | Discharge: 2016-08-26 | Disposition: A | Discharge status: Home / Self Care | Payer: Medicare Other | Source: Ambulatory Visit | Attending: Radiation Oncology | Admitting: Radiation Oncology

## 2016-08-26 ENCOUNTER — Encounter: Payer: Self-pay | Admitting: Radiation Oncology

## 2016-08-26 VITALS — BP 106/75 | HR 80 | Temp 96.6°F | Resp 20 | Wt 213.5 lb

## 2016-08-26 DIAGNOSIS — N186 End stage renal disease: Secondary | ICD-10-CM | POA: Insufficient documentation

## 2016-08-26 DIAGNOSIS — Z923 Personal history of irradiation: Secondary | ICD-10-CM | POA: Diagnosis not present

## 2016-08-26 DIAGNOSIS — I129 Hypertensive chronic kidney disease with stage 1 through stage 4 chronic kidney disease, or unspecified chronic kidney disease: Secondary | ICD-10-CM | POA: Diagnosis not present

## 2016-08-26 DIAGNOSIS — Z79810 Long term (current) use of selective estrogen receptor modulators (SERMs): Secondary | ICD-10-CM | POA: Insufficient documentation

## 2016-08-26 DIAGNOSIS — Z170 Estrogen receptor positive status [ER+]: Secondary | ICD-10-CM | POA: Insufficient documentation

## 2016-08-26 DIAGNOSIS — D0512 Intraductal carcinoma in situ of left breast: Secondary | ICD-10-CM | POA: Diagnosis not present

## 2016-08-26 DIAGNOSIS — Z940 Kidney transplant status: Secondary | ICD-10-CM | POA: Diagnosis not present

## 2016-08-26 NOTE — Progress Notes (Signed)
Radiation Oncology Follow up Note  Name: [name]   Date:   08/26/2016 MRN:  [id number] DOB: 10-Sep-1965    This 51 y.o. 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 She is seen today in routine follow-up is doing well she did have a episode of sinusitis and bronchitis about a week ago requiring hospitalization she has recovered well from that. She specifically denies breast tenderness cough or bone pain. She currently is on tamoxifen tolerating that well without side effect.. She is not yet had a follow-up mammogram she is seen her surgeon next several weeks and will be*at that time for a mammogram to be ordered.  COMPLICATIONS OF TREATMENT: none  FOLLOW UP COMPLIANCE: keeps appointments   PHYSICAL EXAM:  BP 106/75   Pulse 80   Temp (!) 96.6 F (35.9 C)   Resp 20   Wt 213 lb 8.3 oz (96.9 kg)   LMP 03/03/2016   BMI 34.99 kg/m  Lungs are clear to A&P cardiac examination essentially unremarkable with regular rate and rhythm. No dominant mass or nodularity is noted in either breast in 2 positions examined. Incision is well-healed. No axillary or supraclavicular adenopathy is appreciated. Cosmetic result is excellent. Well-developed well-nourished patient in NAD. HEENT reveals PERLA, EOMI, discs not visualized.  Oral cavity is clear. No oral mucosal lesions are identified. Neck is clear without evidence of cervical or supraclavicular adenopathy. Lungs are clear to A&P. Cardiac examination is essentially unremarkable with regular rate and rhythm without murmur rub or thrill. Abdomen is benign with no organomegaly or masses noted.  Motor sensory and DTR levels are equal and symmetric in the upper and lower extremities. Cranial nerves II through XII are grossly intact. Proprioception is intact. No peripheral adenopathy or edema is identified. No motor or sensory levels are noted. Crude visual fields are within normal range.  RADIOLOGY RESULTS: Patient will be seeing her surgeon for scheduling of bilateral diagnostic mammograms.  PLAN: Present time patient is doing well 6 months out. I've asked to see her back in 6 months for follow-up. Expect to see mammograms performed prior to that visit which I will review. Otherwise she continues to do well. She is tolerating tamoxifen without side effect. I have asked to see her back in 6 months for follow-up. Patient knows to call sooner with any concerns.  I would like to take this opportunity to thank you for allowing me to participate in the care of your patient.Armstead Peaks., MD

## 2016-08-28 ENCOUNTER — Other Ambulatory Visit: Payer: Medicare Other

## 2016-08-28 DIAGNOSIS — R399 Unspecified symptoms and signs involving the genitourinary system: Secondary | ICD-10-CM | POA: Diagnosis not present

## 2016-08-29 LAB — BASIC METABOLIC PANEL
BUN / CREAT RATIO: 17 (ref 9–23)
BUN: 27 mg/dL — ABNORMAL HIGH (ref 6–24)
CHLORIDE: 102 mmol/L (ref 96–106)
CO2: 16 mmol/L — ABNORMAL LOW (ref 18–29)
CREATININE: 1.62 mg/dL — AB (ref 0.57–1.00)
Calcium: 9.8 mg/dL (ref 8.7–10.2)
GFR calc non Af Amer: 37 mL/min/{1.73_m2} — ABNORMAL LOW (ref >59)
GFR, EST AFRICAN AMERICAN: 43 mL/min/{1.73_m2} — AB (ref >59)
GLUCOSE: 178 mg/dL — AB (ref 65–99)
Potassium: 5 mmol/L (ref 3.5–5.2)
SODIUM: 137 mmol/L (ref 134–144)

## 2016-08-31 ENCOUNTER — Telehealth: Payer: Self-pay | Admitting: Family Medicine

## 2016-08-31 NOTE — Telephone Encounter (Signed)
Called and spoke to [place]. She notes that she has taken her antibiotic and her cough syrup and her nasal spray and she notes that her mucous has been breaking up and clear. She notes that today, she started to have a bit more yellow in it. Let her know the results of her blood work. Discussed when to come in for an appointment. Will recheck her blood work at her appointment on 2/20. Kidney function has improved since last visit. Copy sent to her nephrologist

## 2016-09-22 ENCOUNTER — Ambulatory Visit: Payer: Medicare Other | Admitting: Family Medicine

## 2016-09-22 ENCOUNTER — Encounter: Payer: Medicare Other | Admitting: Family Medicine

## 2016-09-25 ENCOUNTER — Ambulatory Visit: Payer: Self-pay | Admitting: Family Medicine

## 2016-09-28 ENCOUNTER — Encounter: Payer: Self-pay | Admitting: Family Medicine

## 2016-09-28 ENCOUNTER — Ambulatory Visit (INDEPENDENT_AMBULATORY_CARE_PROVIDER_SITE_OTHER): Payer: Medicare Other | Admitting: Family Medicine

## 2016-09-28 VITALS — BP 117/78 | HR 76 | Temp 98.8°F | Resp 17 | Ht 65.5 in | Wt 217.0 lb

## 2016-09-28 DIAGNOSIS — I129 Hypertensive chronic kidney disease with stage 1 through stage 4 chronic kidney disease, or unspecified chronic kidney disease: Secondary | ICD-10-CM | POA: Diagnosis not present

## 2016-09-28 DIAGNOSIS — R109 Unspecified abdominal pain: Secondary | ICD-10-CM | POA: Diagnosis not present

## 2016-09-28 DIAGNOSIS — R739 Hyperglycemia, unspecified: Secondary | ICD-10-CM | POA: Diagnosis not present

## 2016-09-28 DIAGNOSIS — N3000 Acute cystitis without hematuria: Secondary | ICD-10-CM

## 2016-09-28 DIAGNOSIS — M2550 Pain in unspecified joint: Secondary | ICD-10-CM | POA: Diagnosis not present

## 2016-09-28 DIAGNOSIS — Z8739 Personal history of other diseases of the musculoskeletal system and connective tissue: Secondary | ICD-10-CM | POA: Diagnosis not present

## 2016-09-28 DIAGNOSIS — Z940 Kidney transplant status: Secondary | ICD-10-CM | POA: Diagnosis not present

## 2016-09-28 DIAGNOSIS — E559 Vitamin D deficiency, unspecified: Secondary | ICD-10-CM | POA: Diagnosis not present

## 2016-09-28 DIAGNOSIS — G5791 Unspecified mononeuropathy of right lower limb: Secondary | ICD-10-CM | POA: Diagnosis not present

## 2016-09-28 DIAGNOSIS — M792 Neuralgia and neuritis, unspecified: Secondary | ICD-10-CM

## 2016-09-28 MED ORDER — CIPROFLOXACIN HCL 250 MG PO TABS: 250.0000 mg | ORAL_TABLET | Freq: Two times a day (BID) | ORAL | 0 refills | Status: DC

## 2016-09-28 MED ORDER — NITROFURANTOIN MONOHYD MACRO 100 MG PO CAPS: 100.0000 mg | ORAL_CAPSULE | Freq: Two times a day (BID) | ORAL | 0 refills | Status: DC

## 2016-09-28 NOTE — Progress Notes (Signed)
BP 117/78 (BP Location: Left Arm, Patient Position: Sitting, Cuff Size: Large)   Pulse 76   Temp 98.8 F (37.1 C) (Oral)   Resp 17   Ht 5' 5.5" (1.664 m)   Wt 217 lb (98.4 kg)   LMP 03/03/2016   SpO2 100%   BMI 35.56 kg/m    Subjective:    Patient ID: [name], female    DOB: July 27, 1966, 51 y.o.   MRN: [id number]  HPI: [name] is a 51 y.o. female  Chief Complaint  Patient presents with  . Peripheral Neuropathy  . Flank Pain    Left side onset on and off 1-2 months   NEUROPATHY Neuropathy status: stable- no changes in the tingling in her back and her thigh- no better with her amitriptyline Satisfied with current treatment?: no Medication side effects: no Medication compliance:  excellent compliance Location: low back and thigh Pain: yes Severity: severe  Quality:  tingling Frequency: constant Bilateral: no Symmetric: no Numbness: yes Decreased sensation: yes Weakness: no Context: stable  URINARY SYMPTOMS Duration: flank pain for a year, burning when she pees for a day Dysuria: burning Urinary frequency: yes Urgency: yes Small volume voids: no Symptom severity: moderate Urinary incontinence: no Foul odor: no Hematuria: no Abdominal pain: no Back pain: yes Suprapubic pain/pressure: yes Flank pain: yes Fever:  no Vomiting: no Relief with cranberry juice: no Relief with pyridium: no Status: worse Previous urinary tract infection: yes Recurrent urinary tract infection: no  ARTHRALGIAS / JOINT ACHES Duration: chronic Pain: yes Symmetric: no  Severity: moderate Quality: dull, aching and throbbing Frequency: constant Context:  worse Decreased function/range of motion: yes  Erythema: no Swelling: yes Heat or warmth: no Morning stiffness: yes Aggravating factors: cold weather, wetness Alleviating factors: nothing Relief with NSAIDs?: No NSAIDs Taken Treatments attempted:  rest, ice, heat and APAP  Involved Joints:     Hands: yes  left    Wrists: no      Elbows: yes left    Shoulders: yes bilateral    Back: yes     Hips: no     Knees: no     Ankles: no     Feet: no    Relevant past medical, surgical, family and social history reviewed and updated as indicated. Interim medical history since our last visit reviewed. Allergies and medications reviewed and updated.  Review of Systems  Constitutional: Negative.   Respiratory: Negative.   Cardiovascular: Negative.   Genitourinary: Positive for dysuria, flank pain, frequency and urgency. Negative for decreased urine volume, difficulty urinating, dyspareunia, enuresis, genital sores, hematuria, menstrual problem, pelvic pain, vaginal bleeding, vaginal discharge and vaginal pain.  Musculoskeletal: Positive for arthralgias, back pain and myalgias. Negative for gait problem, joint swelling, neck pain and neck stiffness.  Psychiatric/Behavioral: Negative.     Per HPI unless specifically indicated above     Objective:    BP 117/78 (BP Location: Left Arm, Patient Position: Sitting, Cuff Size: Large)   Pulse 76   Temp 98.8 F (37.1 C) (Oral)   Resp 17   Ht 5' 5.5" (1.664 m)   Wt 217 lb (98.4 kg)   LMP 03/03/2016   SpO2 100%   BMI 35.56 kg/m   Wt Readings from Last 3 Encounters:  09/28/16 217 lb (98.4 kg)  08/26/16 213 lb 8.3 oz (96.9 kg)  08/17/16 212 lb 1.6 oz (96.2 kg)    Physical Exam  Constitutional: She is oriented to person, place, and time. She appears  well-developed and well-nourished. No distress.  HENT:  Head: Normocephalic and atraumatic.  Right Ear: Hearing normal.  Left Ear: Hearing normal.  Nose: Nose normal.  Eyes: Conjunctivae and lids are normal. Right eye exhibits no discharge. Left eye exhibits no discharge. No scleral icterus.  Cardiovascular: Normal rate, regular rhythm, normal heart sounds and intact distal pulses.  Exam reveals no gallop and no friction rub.   No murmur heard. Pulmonary/Chest: Effort normal and breath sounds  normal. No respiratory distress. She has no wheezes. She has no rales. She exhibits no tenderness.  Musculoskeletal: Normal range of motion.  Neurological: She is alert and oriented to person, place, and time.  Skin: Skin is warm, dry and intact. No rash noted. She is not diaphoretic. No erythema. No pallor.  Psychiatric: She has a normal mood and affect. Her speech is normal and behavior is normal. Judgment and thought content normal. Cognition and memory are normal.  Nursing note and vitals reviewed.   Results for orders placed or performed in visit on 09/28/16  HM PAP SMEAR  Result Value Ref Range   HM Pap smear Negative, with negative HPV       Assessment & Plan:   Problem List Items Addressed This Visit      Other   Neuropathic pain of right thigh    No better with amitriptyline. Doesn't want to continue with it. Will stop amitriptyline.        Other Visit Diagnoses    Acute cystitis without hematuria    -  Primary   Will treat with cipro, dose adjusted. Call if not getting better or getting worse.    Flank pain       Likely multi-factorial and in part due to radiation, and neuropathy, however, has UTI today- will treat that. Call if not getting better.    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 Will check rheum labs and for tick diseases. Await results.    Relevant Orders   CBC with Differential/Platelet   Comprehensive metabolic panel   TSH   VITAMIN D 25 Hydroxy (Vit-D Deficiency, Fractures)   Lyme Ab/Western Blot Reflex   Rocky mtn spotted fvr abs pnl(IgG+IgM)   Antinuclear Antib (ANA)   Ehrlichia Antibody Panel   Babesia microti Antibody Panel   RA Qn+CCP(IgG/A)+SjoSSA+SjoSSB   Sed Rate (ESR)   CK Total (and CKMB)   Elevated blood sugar       Checking A1c today- await results.    Relevant Orders   Hgb A1c w/o eAG       Follow up plan: Return in about 3 months (around  12/26/2016).

## 2016-09-28 NOTE — Assessment & Plan Note (Signed)
No better with amitriptyline. Doesn't want to continue with it. Will stop amitriptyline.

## 2016-09-29 LAB — UA/M W/RFLX CULTURE, ROUTINE
BILIRUBIN UA: NEGATIVE
GLUCOSE, UA: NEGATIVE
Ketones, UA: NEGATIVE
NITRITE UA: NEGATIVE
Protein, UA: NEGATIVE
Specific Gravity, UA: <1.005 — ABNORMAL LOW (ref 1.005–1.030)
UUROB: 0.2 mg/dL (ref 0.2–1.0)
pH, UA: 5.5 (ref 5.0–7.5)

## 2016-09-29 LAB — MICROSCOPIC EXAMINATION: RBC, UA: NONE SEEN /hpf (ref 0–?)

## 2016-09-29 LAB — HGB A1C W/O EAG: HEMOGLOBIN A1C: 6.8 % — AB (ref 4.8–5.6)

## 2016-09-30 LAB — CBC WITH DIFFERENTIAL/PLATELET
BASOS ABS: 0 10*3/uL (ref 0.0–0.2)
Basos: 0 %
EOS (ABSOLUTE): 0.2 10*3/uL (ref 0.0–0.4)
EOS: 2 %
Hematocrit: 36.1 % (ref 34.0–46.6)
Hemoglobin: 11.6 g/dL (ref 11.1–15.9)
IMMATURE GRANULOCYTES: 0 %
Immature Grans (Abs): 0 10*3/uL (ref 0.0–0.1)
Lymphocytes Absolute: 1.4 10*3/uL (ref 0.7–3.1)
Lymphs: 17 %
MCH: 29.1 pg (ref 26.6–33.0)
MCHC: 32.1 g/dL (ref 31.5–35.7)
MCV: 91 fL (ref 79–97)
MONOCYTES: 6 %
MONOS ABS: 0.5 10*3/uL (ref 0.1–0.9)
NEUTROS PCT: 75 %
Neutrophils Absolute: 6.2 10*3/uL (ref 1.4–7.0)
Platelets: 231 10*3/uL (ref 150–379)
RBC: 3.99 x10E6/uL (ref 3.77–5.28)
RDW: 13.5 % (ref 12.3–15.4)
WBC: 8.3 10*3/uL (ref 3.4–10.8)

## 2016-09-30 LAB — COMPREHENSIVE METABOLIC PANEL
ALT: 8 IU/L (ref 0–32)
AST: 10 IU/L (ref 0–40)
Albumin/Globulin Ratio: 1.5 (ref 1.2–2.2)
Albumin: 4.4 g/dL (ref 3.5–5.5)
Alkaline Phosphatase: 41 IU/L (ref 39–117)
BUN/Creatinine Ratio: 20 (ref 9–23)
BUN: 36 mg/dL — AB (ref 6–24)
Bilirubin Total: <0.2 mg/dL (ref 0.0–1.2)
CALCIUM: 10.2 mg/dL (ref 8.7–10.2)
CO2: 19 mmol/L (ref 18–29)
CREATININE: 1.78 mg/dL — AB (ref 0.57–1.00)
Chloride: 100 mmol/L (ref 96–106)
GFR calc Af Amer: 38 mL/min/{1.73_m2} — ABNORMAL LOW (ref >59)
GFR, EST NON AFRICAN AMERICAN: 33 mL/min/{1.73_m2} — AB (ref >59)
GLUCOSE: 109 mg/dL — AB (ref 65–99)
Globulin, Total: 3 g/dL (ref 1.5–4.5)
Potassium: 5.2 mmol/L (ref 3.5–5.2)
Sodium: 137 mmol/L (ref 134–144)
Total Protein: 7.4 g/dL (ref 6.0–8.5)

## 2016-09-30 LAB — VITAMIN D 25 HYDROXY (VIT D DEFICIENCY, FRACTURES): VIT D 25 HYDROXY: 17.1 ng/mL — AB (ref 30.0–100.0)

## 2016-09-30 LAB — RA QN+CCP(IGG/A)+SJOSSA+SJOSSB
Cyclic Citrullin Peptide Ab: 2 units (ref 0–19)
ENA SSA (RO) Ab: <0.2 AI (ref 0.0–0.9)
ENA SSB (LA) Ab: <0.2 AI (ref 0.0–0.9)

## 2016-09-30 LAB — LYME AB/WESTERN BLOT REFLEX: LYME DISEASE AB, QUANT, IGM: <0.8 index (ref 0.00–0.79)

## 2016-09-30 LAB — SEDIMENTATION RATE: Sed Rate: 9 mm/hr (ref 0–32)

## 2016-09-30 LAB — BABESIA MICROTI ANTIBODY PANEL
Babesia microti IgG: <1:10 {titer}
Babesia microti IgM: <1:10 {titer}

## 2016-09-30 LAB — ANA: Anti Nuclear Antibody(ANA): NEGATIVE

## 2016-09-30 LAB — EHRLICHIA ANTIBODY PANEL
E. CHAFFEENSIS (HME) IGM TITER: NEGATIVE
E.Chaffeensis (HME) IgG: NEGATIVE
HGE IGG TITER: NEGATIVE
HGE IGM TITER: NEGATIVE

## 2016-09-30 LAB — ROCKY MTN SPOTTED FVR ABS PNL(IGG+IGM)
RMSF IgG: NEGATIVE
RMSF IgM: 0.95 index — ABNORMAL HIGH (ref 0.00–0.89)

## 2016-09-30 LAB — CK TOTAL AND CKMB (NOT AT ARMC)
CK MB INDEX: 1.7 ng/mL (ref 0.0–5.3)
Total CK: 126 U/L (ref 24–173)

## 2016-09-30 LAB — TSH: TSH: 0.876 u[IU]/mL (ref 0.450–4.500)

## 2016-10-02 ENCOUNTER — Telehealth: Payer: Self-pay | Admitting: Family Medicine

## 2016-10-02 DIAGNOSIS — E559 Vitamin D deficiency, unspecified: Secondary | ICD-10-CM

## 2016-10-02 DIAGNOSIS — Z940 Kidney transplant status: Secondary | ICD-10-CM

## 2016-10-02 DIAGNOSIS — E1129 Type 2 diabetes mellitus with other diabetic kidney complication: Secondary | ICD-10-CM | POA: Insufficient documentation

## 2016-10-02 DIAGNOSIS — E119 Type 2 diabetes mellitus without complications: Secondary | ICD-10-CM

## 2016-10-02 NOTE — Telephone Encounter (Addendum)
Called [name] about her results. LMOM for her to call back.   Her sugars came back in the diabetic range. No need for medicine just yet, but this is going to be dangerous for her kidney. I'd like her to see the [facility], and she will also need to see an eye doctor. Referrals pended.   Her inflammation labs came back normal, no sign of tick bites either.   Her kidney function is up a little bit again. Make sure she's drinking the water and I know she's seeing the nephrologist again in April. We'll make sure to send her a copy again.   Her vitamin D is also very low, and I'd like to start her on a supplement- it's pended to go to her pharmacy when we talk to her.

## 2016-10-06 MED ORDER — VITAMIN D (ERGOCALCIFEROL) 1.25 MG (50000 UNIT) PO CAPS: 50000.0000 [IU] | ORAL_CAPSULE | ORAL | 0 refills | Status: DC

## 2016-10-06 NOTE — Telephone Encounter (Signed)
Called and gave patient the information below. She is comfortable with this plan. Referrals and medication signed. She was not aware of the diabetes diagnosis.

## 2016-10-21 ENCOUNTER — Telehealth: Payer: Self-pay | Admitting: Family Medicine

## 2016-10-21 MED ORDER — FLUCONAZOLE 150 MG PO TABS: 150.0000 mg | ORAL_TABLET | Freq: Once | ORAL | 0 refills | Status: AC

## 2016-10-21 NOTE — Telephone Encounter (Signed)
Patient has a question regarding the medication Dr Wynetta Emery prescribed her for UTI.  Thanks

## 2016-10-21 NOTE — Telephone Encounter (Signed)
I meant to say in the last message that the patient would like something for a yeast infection sent in to [facility].

## 2016-10-21 NOTE — Telephone Encounter (Signed)
Done

## 2016-10-21 NOTE — Telephone Encounter (Signed)
Called and left patient a VM letting her know that a medication was sent in for her.

## 2016-10-21 NOTE — Telephone Encounter (Signed)
Called and spoke with patient. She stated that she has been having some vaginal itching while and since taking cipro.for a UTI. She would like something sent in for a UTI to [facility].

## 2016-10-28 ENCOUNTER — Telehealth: Payer: Self-pay | Admitting: Family Medicine

## 2016-10-28 NOTE — Telephone Encounter (Signed)
Routing to provider  

## 2016-10-28 NOTE — Telephone Encounter (Signed)
Patient requested a call from Dr Wynetta Emery when she returns some time tomorrow.   Thanks

## 2016-10-28 NOTE — Telephone Encounter (Signed)
Can we get an idea of what she'd like- I'd be more than happy to call her tomorrow.

## 2016-10-28 NOTE — Telephone Encounter (Signed)
She was concerned about the results of diabetes.  She said she had never been told that. She said it just startled her to hear that she was a diabetic.

## 2016-10-29 NOTE — Telephone Encounter (Signed)
Called and discussed with patient. She is aware. She is going to see nutrition. All questions answered. She is hoping to have her labs for her transplant doctor drawn here. I told her that would be OK to save her a trip to [facility]. She will check with the transplant doctor to make sure they are OK with that.

## 2016-11-04 DIAGNOSIS — M545 Low back pain: Secondary | ICD-10-CM | POA: Diagnosis not present

## 2016-11-04 DIAGNOSIS — Z940 Kidney transplant status: Secondary | ICD-10-CM | POA: Diagnosis not present

## 2016-11-04 DIAGNOSIS — Z170 Estrogen receptor positive status [ER+]: Secondary | ICD-10-CM | POA: Diagnosis not present

## 2016-11-04 DIAGNOSIS — G8929 Other chronic pain: Secondary | ICD-10-CM | POA: Diagnosis not present

## 2016-11-04 DIAGNOSIS — Z79810 Long term (current) use of selective estrogen receptor modulators (SERMs): Secondary | ICD-10-CM | POA: Diagnosis not present

## 2016-11-04 DIAGNOSIS — E872 Acidosis: Secondary | ICD-10-CM | POA: Diagnosis not present

## 2016-11-04 DIAGNOSIS — Z7951 Long term (current) use of inhaled steroids: Secondary | ICD-10-CM | POA: Diagnosis not present

## 2016-11-04 DIAGNOSIS — Z9012 Acquired absence of left breast and nipple: Secondary | ICD-10-CM | POA: Diagnosis not present

## 2016-11-04 DIAGNOSIS — Z7982 Long term (current) use of aspirin: Secondary | ICD-10-CM | POA: Diagnosis not present

## 2016-11-04 DIAGNOSIS — D0512 Intraductal carcinoma in situ of left breast: Secondary | ICD-10-CM | POA: Diagnosis not present

## 2016-11-04 DIAGNOSIS — E875 Hyperkalemia: Secondary | ICD-10-CM | POA: Diagnosis not present

## 2016-11-04 DIAGNOSIS — Z4822 Encounter for aftercare following kidney transplant: Secondary | ICD-10-CM | POA: Diagnosis not present

## 2016-11-04 DIAGNOSIS — I10 Essential (primary) hypertension: Secondary | ICD-10-CM | POA: Diagnosis not present

## 2016-11-12 ENCOUNTER — Telehealth: Payer: Self-pay

## 2016-11-12 NOTE — Telephone Encounter (Signed)
Attempted to reach to schedule for Medicare Wellness. Message left for pt to return call.   

## 2016-11-17 ENCOUNTER — Encounter: Payer: Self-pay | Admitting: Dietician

## 2016-11-17 ENCOUNTER — Encounter: Payer: Medicare Other | Attending: Family Medicine | Admitting: Dietician

## 2016-11-17 VITALS — Ht 65.0 in | Wt 214.8 lb

## 2016-11-17 DIAGNOSIS — E119 Type 2 diabetes mellitus without complications: Secondary | ICD-10-CM | POA: Insufficient documentation

## 2016-11-17 DIAGNOSIS — Z940 Kidney transplant status: Secondary | ICD-10-CM | POA: Diagnosis not present

## 2016-11-17 DIAGNOSIS — Z6835 Body mass index (BMI) 35.0-35.9, adult: Secondary | ICD-10-CM | POA: Diagnosis not present

## 2016-11-17 DIAGNOSIS — Z713 Dietary counseling and surveillance: Secondary | ICD-10-CM | POA: Diagnosis not present

## 2016-11-17 NOTE — Progress Notes (Signed)
Medical Nutrition Therapy: Visit start time: 9937  end time: 1215  Assessment:  Diagnosis: diabetes, hx renal transplant Past medical history: renal failure Psychosocial issues/ stress concerns: none Preferred learning method:  . Auditory . Visual . Hands-on  Current weight: 214.8lbs  Height: 5'5" Medications, supplements: reconciled list in medical record  Progress and evaluation: Patient reports weight loss of about 3lbs. Recently, due to making diet changes. She is now eating smaller portions, has decreased meats and increased vegetables, restricts bread intake to lose weight. She reports often feeling nauseated and therefore eats saltine crackers and yogurt often. She is not testing blood sugar but will have a meter soon and will begin testing.   Physical activity: none recently, was walking outside her home, but now feels unsafe and has had sinus trouble. Has [employer] membership and plans to resume exercise as soon as she can.   Dietary Intake:  Usual eating pattern includes 2 meals and 3 snacks per day. Dining out frequency: 0-1 meals per week.  Breakfast: cream of wheat or grits and scrambled or boiled eggs Snack: yogurt Lunch: crackers Snack: yogurt, occasionally popcorn Supper: grilled chicken, salad, green beans Snack: yogurt or crackers Beverages: water, occasionally lemonade, fruit juice or punch, or tea or ginger ale.   Nutrition Care Education: Topics covered: diabetes, weight management Basic nutrition: basic food groups, appropriate nutrient balance, appropriate meal and snack schedule, general nutrition guidelines    Weight control: benefits of weight control, appropriate food choices and portions.  Advanced nutrition:  recipe modification, cooking techniques, dining out, food label reading Diabetes: appropriate meal and snack schedule, appropriate carb intake and balance, sick day guidelines-- advised eating small portions often and including protein source as  often as able. Discussed protein options. Discussed benefits of regular exercise on both blood sugars and weight.  Nutritional Diagnosis:  [place]-2.2 Altered nutrition-related laboratory As related to hyperglycemia.  As evidenced by HbA1C 6.8%. [place]-3.3 Overweight/obesity As related to history of excess calories, inactivity.  As evidenced by BMI 35.6, patient report.  Intervention: Instruction as noted above.   Commended patient for changes made thus far.    Set goals with patient input.   Education Materials given:  . General diet guidelines for Diabetes . Plate Planner . Food lists/ Planning A Balanced Meal . Sample meal pattern/ menus . Goals/ instructions  Learner/ who was taught:  . Patient   Level of understanding: Marland Kitchen Verbalizes/ demonstrates competency  Demonstrated degree of understanding via:   Teach back Learning barriers: . None  Willingness to learn/ readiness for change: . Eager, change in progress  Monitoring and Evaluation:  Dietary intake, exercise, BG control, and body weight      follow up: 12/15/16

## 2016-11-17 NOTE — Patient Instructions (Signed)
   Keep up your healthy food choices, great job!  Make sure to eat regularly, every 3-4 hours during the day.   Include a protein food, a small portion of starch, and a vegetable or fruit with each meal.   Gradually increase exercise.

## 2016-11-18 ENCOUNTER — Telehealth: Payer: Self-pay | Admitting: Family Medicine

## 2016-11-18 NOTE — Telephone Encounter (Signed)
Patient called to see if Dr Wynetta Emery would give her something for allergy/sinus or does she need to schedule an appt  [phone]  Thanks

## 2016-11-18 NOTE — Telephone Encounter (Signed)
Message routed to [place] by accident. Will route to correct provider now.

## 2016-11-18 NOTE — Telephone Encounter (Signed)
Routing to provider  

## 2016-11-19 MED ORDER — FLUTICASONE PROPIONATE 50 MCG/ACT NA SUSP: NASAL | 12 refills | Status: DC

## 2016-11-19 MED ORDER — CETIRIZINE HCL 10 MG PO TABS: 10.0000 mg | ORAL_TABLET | Freq: Every day | ORAL | 11 refills | Status: DC

## 2016-11-19 NOTE — Telephone Encounter (Signed)
Zyrtec sent to her pharmacy. She should keep using her flonase. If she's not better, come in.

## 2016-11-19 NOTE — Telephone Encounter (Signed)
Called patient, no answer, unable to leave a message. Will try again.  

## 2016-11-20 NOTE — Telephone Encounter (Signed)
Patient notified

## 2016-11-20 NOTE — Telephone Encounter (Signed)
Called patient, no answer, unable to leave a message. Will try again.  

## 2016-11-22 DIAGNOSIS — J0100 Acute maxillary sinusitis, unspecified: Secondary | ICD-10-CM | POA: Diagnosis not present

## 2016-11-27 ENCOUNTER — Encounter: Payer: Self-pay | Admitting: Family Medicine

## 2016-11-27 ENCOUNTER — Ambulatory Visit (INDEPENDENT_AMBULATORY_CARE_PROVIDER_SITE_OTHER): Payer: Medicare Other | Admitting: Family Medicine

## 2016-11-27 VITALS — BP 126/84 | HR 76 | Temp 98.8°F | Wt 215.0 lb

## 2016-11-27 DIAGNOSIS — J069 Acute upper respiratory infection, unspecified: Secondary | ICD-10-CM | POA: Diagnosis not present

## 2016-11-27 MED ORDER — DOXYCYCLINE HYCLATE 100 MG PO TABS: 100.0000 mg | ORAL_TABLET | Freq: Two times a day (BID) | ORAL | 0 refills | Status: DC

## 2016-11-27 NOTE — Progress Notes (Signed)
   BP 126/84   Pulse 76   Temp 98.8 F (37.1 C)   Wt 215 lb (97.5 kg)   LMP 03/03/2016   SpO2 98%   BMI 35.78 kg/m    Subjective:    Patient ID: [name], female    DOB: 07/30/1966, 51 y.o.   MRN: [id number]  HPI: [name] is a 51 y.o. female  Chief Complaint  Patient presents with  . URI    sick over 2 weeks. was seen at [facility] on Sunday. Given Zpack. Starts getting better and then it comes right back. Head/chest congestion, productive cough. some sore throat, sinus drainage, h/a. No known fever.    Patient presents with over 2 week history of congestion, productive cough of thick sputum, sore throat, sinus pain/pressure, HA. Denies fever, chills, aches, SOB, wheezing. Went to UC nearly a week ago and is completing a zpack today, minimal improvement. Taking home allergy medication, mucinex, sinus rinses, etc with no improvement.  Pt recently in remission from breast cancer and has hx of kidney transplant.   Relevant past medical, surgical, family and social history reviewed and updated as indicated. Interim medical history since our last visit reviewed. Allergies and medications reviewed and updated.  Review of Systems  Constitutional: Negative.   HENT: Positive for congestion, sinus pain, sinus pressure and sore throat.   Respiratory: Positive for cough.   Cardiovascular: Negative.   Gastrointestinal: Negative.   Genitourinary: Negative.   Musculoskeletal: Negative.   Neurological: Positive for headaches.  Psychiatric/Behavioral: Negative.     Per HPI unless specifically indicated above     Objective:    BP 126/84   Pulse 76   Temp 98.8 F (37.1 C)   Wt 215 lb (97.5 kg)   LMP 03/03/2016   SpO2 98%   BMI 35.78 kg/m   Wt Readings from Last 3 Encounters:  11/29/16 210 lb (95.3 kg)  11/27/16 215 lb (97.5 kg)  11/17/16 214 lb 12.8 oz (97.4 kg)    Physical Exam  Constitutional: She is oriented to person, place, and time. She appears well-developed and  well-nourished.  HENT:  Head: Atraumatic.  Thick discharge present in b/l nares Oropharynx erythematous posteriorly  Eyes: Conjunctivae are normal. Pupils are equal, round, and reactive to light.  Neck: Normal range of motion. Neck supple.  Cardiovascular: Normal rate and normal heart sounds.   Pulmonary/Chest: Effort normal and breath sounds normal. No respiratory distress.  Musculoskeletal: Normal range of motion.  Neurological: She is alert and oriented to person, place, and time.  Skin: Skin is warm and dry.  Psychiatric: She has a normal mood and affect. Her behavior is normal.  Nursing note and vitals reviewed.     Assessment & Plan:   Problem List Items Addressed This Visit    None    Visit Diagnoses    Upper respiratory tract infection, unspecified type    -  Primary   Will switch to doxycycline given poor response to azithromycin. Continue OTC remedies, humidifier, etc. F/u if worsening or no improvement       Follow up plan: Return in about 4 weeks (around 12/25/2016) for Regular f/u.

## 2016-11-29 ENCOUNTER — Emergency Department: Payer: Medicare Other

## 2016-11-29 ENCOUNTER — Encounter: Payer: Self-pay | Admitting: *Deleted

## 2016-11-29 ENCOUNTER — Emergency Department
Admission: EM | Admit: 2016-11-29 | Discharge: 2016-11-29 | Disposition: A | Discharge status: Home / Self Care | Payer: Medicare Other | Attending: Emergency Medicine | Admitting: Emergency Medicine

## 2016-11-29 DIAGNOSIS — Y999 Unspecified external cause status: Secondary | ICD-10-CM | POA: Diagnosis not present

## 2016-11-29 DIAGNOSIS — Z7982 Long term (current) use of aspirin: Secondary | ICD-10-CM | POA: Insufficient documentation

## 2016-11-29 DIAGNOSIS — Z79899 Other long term (current) drug therapy: Secondary | ICD-10-CM | POA: Diagnosis not present

## 2016-11-29 DIAGNOSIS — W230XXA Caught, crushed, jammed, or pinched between moving objects, initial encounter: Secondary | ICD-10-CM | POA: Diagnosis not present

## 2016-11-29 DIAGNOSIS — S62660A Nondisplaced fracture of distal phalanx of right index finger, initial encounter for closed fracture: Secondary | ICD-10-CM | POA: Diagnosis not present

## 2016-11-29 DIAGNOSIS — S6710XA Crushing injury of unspecified finger(s), initial encounter: Secondary | ICD-10-CM

## 2016-11-29 DIAGNOSIS — N186 End stage renal disease: Secondary | ICD-10-CM | POA: Insufficient documentation

## 2016-11-29 DIAGNOSIS — Y939 Activity, unspecified: Secondary | ICD-10-CM | POA: Insufficient documentation

## 2016-11-29 DIAGNOSIS — Z992 Dependence on renal dialysis: Secondary | ICD-10-CM | POA: Insufficient documentation

## 2016-11-29 DIAGNOSIS — I120 Hypertensive chronic kidney disease with stage 5 chronic kidney disease or end stage renal disease: Secondary | ICD-10-CM | POA: Insufficient documentation

## 2016-11-29 DIAGNOSIS — S67190A Crushing injury of right index finger, initial encounter: Secondary | ICD-10-CM | POA: Diagnosis not present

## 2016-11-29 DIAGNOSIS — Y92481 Parking lot as the place of occurrence of the external cause: Secondary | ICD-10-CM | POA: Insufficient documentation

## 2016-11-29 DIAGNOSIS — Z87891 Personal history of nicotine dependence: Secondary | ICD-10-CM | POA: Diagnosis not present

## 2016-11-29 DIAGNOSIS — S62639B Displaced fracture of distal phalanx of unspecified finger, initial encounter for open fracture: Secondary | ICD-10-CM

## 2016-11-29 MED ORDER — HYDROCODONE-ACETAMINOPHEN 5-325 MG PO TABS: 1.0000 | ORAL_TABLET | Freq: Three times a day (TID) | ORAL | 0 refills | Status: DC | PRN

## 2016-11-29 MED ORDER — HYDROCODONE-ACETAMINOPHEN 5-325 MG PO TABS
1.0000 | ORAL_TABLET | Freq: Once | ORAL | Status: AC
  Administered 2016-11-29: 1 via ORAL
  Filled 2016-11-29: qty 1

## 2016-11-29 MED ORDER — SULFAMETHOXAZOLE-TRIMETHOPRIM 800-160 MG PO TABS
1.0000 | ORAL_TABLET | Freq: Once | ORAL | Status: AC
  Administered 2016-11-29: 1 via ORAL
  Filled 2016-11-29: qty 1

## 2016-11-29 MED ORDER — BACITRACIN ZINC 500 UNIT/GM EX OINT
TOPICAL_OINTMENT | Freq: Once | CUTANEOUS | Status: AC
  Administered 2016-11-29: 1 via TOPICAL
  Filled 2016-11-29: qty 0.9

## 2016-11-29 MED ORDER — SULFAMETHOXAZOLE-TRIMETHOPRIM 800-160 MG PO TABS: 1.0000 | ORAL_TABLET | Freq: Two times a day (BID) | ORAL | 0 refills | Status: DC

## 2016-11-29 NOTE — ED Triage Notes (Signed)
Patient reports slamming her 2nd finger on right hand in a car door approximately 15 minutes PTA.

## 2016-11-29 NOTE — ED Provider Notes (Signed)
[facility] Emergency Department Provider Note ____________________________________________  Time seen: 2007  I have reviewed the triage vital signs and the nursing notes.  HISTORY  Chief Complaint  Finger Injury  HPI [name] is a 51 y.o. female Presents to the ED for evaluation of right index finger injury. Patient describes injuring her finger here in the parking lot as she went to close a car door. She definitely posterior right index finger into the door. She sustained a laceration over the nail cuticle and noted immediate pain and swelling. She presents now with disability with flexion range at the DIP. She also has some active bleeding to the proximal cuticle. She denies any other injury at this time.She is a recent kidney transplant patient reports a current tetanus status.  Past Medical History:  Diagnosis Date  . Arrhythmia   . Cerebrovascular accident [facility]) 2010   without any focal neurological deficits.   . Clotted renal dialysis AV graft ([place])   . 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 Ductal carcinoma in situ (DCIS) of left breast   . End stage renal disease ([place])    a. previously on dialysis on Tues, Thurs, & 11-21-2022 x 7 yrs; b. s/p deceased donor transplant 04/14/14   . GERD (gastroesophageal reflux disease)   . History of methicillin resistant Staphylococcus aureus infection   . History of stress test    a. 04/2013: normal, EF 65%  . HIT (heparin-induced thrombocytopenia) ([place])   . Hypertension   . Mitral regurgitation    a. 01/2014: EF >55%, LVH, mild MR, dilated LA  . Renal transplant, status post 14-Apr-2014  . RLS (restless legs syndrome)   . Seizure disorder ([place])   . Seizures ([place])   . Stroke due to intracerebral hemorrhage ([place]) 11/17/2013    Patient Active Problem List   Diagnosis Date Noted  . Diabetes mellitus type 2, diet-controlled ([place]) 10/02/2016  . Vitamin D deficiency 10/02/2016  . 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 CKD (chronic kidney disease) stage 3, GFR 30-59 ml/min 08/17/2016  . Neuropathic pain of right thigh 08/17/2016  . History of CVA (cerebrovascular accident) 08/17/2016  . Ductal carcinoma in situ (DCIS) of left breast   . Sepsis ([place]) 08/09/2016  . Mitral regurgitation   . Renal transplant, status post Apr 14, 2014  . SVT (supraventricular tachycardia) ([place]) 11/17/2013  . Seizure disorder ([place]) 11/17/2013  . Benign hypertensive renal disease 11/17/2013    Past Surgical History:  Procedure Laterality Date  . INSERTION OF DIALYSIS CATHETER     x 2  . KIDNEY TRANSPLANT  03/2014   right side.  Marland Kitchen MASTECTOMY PARTIAL / LUMPECTOMY Left 09/24/2015  . MASTECTOMY PARTIAL / LUMPECTOMY Left 10/25/2015    Prior to Admission medications   Medication Sig Start Date End Date Taking? Authorizing Provider  acetaminophen (TYLENOL) 500 MG tablet Take 500 mg by mouth every 6 (six) hours as needed.    Historical Provider, MD  aspirin EC 81 MG tablet Take 81 mg by mouth daily.    Historical Provider, MD  carvedilol (COREG) 12.5 MG tablet Take 12.5 mg by mouth 2 (two) times daily with a meal.  08/06/16   Historical Provider, MD  cetirizine (ZYRTEC) 10 MG tablet Take 1 tablet (10 mg total) by mouth daily. 11/19/16   Megan P Johnson, DO  docusate sodium (COLACE) 100 MG capsule Take 100 mg by mouth 2 (two) times daily as needed. 11/04/16 11/04/17  Historical Provider, MD  doxycycline (VIBRA-TABS)  100 MG tablet Take 1 tablet (100 mg total) by mouth 2 (two) times daily. 11/27/16   Volney American, PA-C  fluticasone [facility]) 50 MCG/ACT nasal spray Two sprays each nare twice a day. 11/19/16   Megan P Johnson, DO  gabapentin (NEURONTIN) 300 MG capsule Take 300-600 mg by mouth 3 (three) times daily. Take 1 capsule (300 mg) by mouth in the morning and at noon, 600 mg at bedtime. 06/13/14   Historical Provider, MD  HYDROcodone-acetaminophen (NORCO) 5-325 MG tablet Take 1 tablet by mouth 3  (three) times daily as needed. 11/29/16   Crystallynn Noorani V Bacon Kyara Boxer, PA-C  levETIRAcetam (KEPPRA) 500 MG tablet Take 500 mg by mouth daily. 11/09/16   Historical Provider, MD  mycophenolate (MYFORTIC) 180 MG EC tablet Take 360 mg by mouth. 10/02/16 10/02/17  Historical Provider, MD  omeprazole (PRILOSEC) 20 MG capsule Take 20 mg by mouth at bedtime.  09/15/13   Historical Provider, MD  sodium bicarbonate 650 MG tablet Take 2 tablets (1300 mg) three times a day. 04/11/14   Historical Provider, MD  SSD 1 % cream  09/05/16   Historical Provider, MD  sulfamethoxazole-trimethoprim (BACTRIM DS,SEPTRA DS) 800-160 MG tablet Take 1 tablet by mouth 2 (two) times daily. 11/29/16   Rockwell Zentz V Bacon Frederik Standley, PA-C  tacrolimus (PROGRAF) 1 MG capsule Take 4 mg by mouth 2 (two) times daily.  06/06/14   Historical Provider, MD  tamoxifen (NOLVADEX) 20 MG tablet Take 20 mg by mouth. 12/03/15 12/02/16  Historical Provider, MD  Vitamin D, Ergocalciferol, (DRISDOL) 50000 units CAPS capsule Take 1 capsule (50,000 Units total) by mouth every 7 (seven) days. 10/06/16   Megan P Johnson, DO    Allergies Heparin; Ibuprofen; and Penicillins  Family History  Problem Relation Age of Onset  . Heart attack Mother   . Hypertension Mother   . Hyperlipidemia Mother   . Cancer Mother     breast  . Heart attack Father   . Hypertension Father   . Hyperlipidemia Father   . Kidney failure Sister     half sister  . Diabetes Maternal Grandmother   . Hearing loss Maternal Grandmother   . Heart failure Maternal Grandmother   . Diabetes Maternal Grandfather   . Stroke Maternal Grandfather   . Cancer Paternal Grandmother     breast  . Stroke Paternal Grandfather   . Aneurysm Brother     Brain  . Other Sister     Nerve problems    Social History Social History  Substance Use Topics  . Smoking status: Former Smoker    Quit date: 11/25/2008  . Smokeless tobacco: Never Used  . Alcohol use Yes     Comment: on occasion    Review of  Systems  Constitutional: Negative for fever. Musculoskeletal: Negative for back pain. Right index finger pain as above. Skin: Negative for rash. Neurological: Negative for headaches, focal weakness or numbness. ____________________________________________  PHYSICAL EXAM:  VITAL SIGNS: ED Triage Vitals [11/29/16 1840]  Enc Vitals Group     BP (!) 146/85     Pulse Rate 80     Resp 18     Temp 98 F (36.7 C)     Temp Source Oral     SpO2 99 %     Weight 210 lb (95.3 kg)     Height 5\' 6"  (1.676 m)     Head Circumference      Peak Flow      Pain Score 10  Pain Loc      Pain Edu?      Excl. in [place]?     Constitutional: Alert and oriented. Well appearing and in no distress. Head: Normocephalic and atraumatic. Cardiovascular: Normal rate, regular rhythm. Normal distal pulses. Respiratory: Normal respiratory effort.  Musculoskeletal: Right index finger with a laceration to the cuticle proximal to the nail bed. Slow bleeding noted. No subungual hematoma noted. No nail avulsion seen. Early ecchymosis noted to the volar DIP. Decreased DIP flexion range due to pain. Nontender with normal range of motion in all extremities.  Neurologic:  Normal gross sensation. Normal speech and language. No gross focal neurologic deficits are appreciated. Skin:  Skin is warm, dry and intact. No rash noted. ____________________________________________   RADIOLOGY  Right Index Finger IMPRESSION: Comminuted nondisplaced fracture involving the tuft and distal shaft of the second distal phalanx.  I, Tyresha Fede, Dannielle Karvonen, personally viewed and evaluated these images (plain radiographs) as part of my medical decision making, as well as reviewing the written report by the radiologist. ____________________________________________  PROCEDURES  Norco 5-325 mg PO Bactrim DS i PO Bacitracin Ointment Finger Splint ____________________________________________  INITIAL IMPRESSION / ASSESSMENT AND PLAN  / ED COURSE  Patient with initial fracture management of a comminuted distal tuft fracture of the right index finger. She has a laceration of the cuticle without subungual hematoma. She is treated empirically with Bactrim for open fracture prophylaxis. Wound care instructions are provided. A prescription for Bactrim and Norco are provided. She will follow-up with Dr. Mack Guise for further fracture and wound management.  ____________________________________________  FINAL CLINICAL IMPRESSION(S) / ED DIAGNOSES  Final diagnoses:  Crushing injury of finger, initial encounter  Open fracture of tuft of distal phalanx of finger      Melvenia Needles, PA-C 11/29/16 2110    Carrie Mew, MD 11/30/16 2316

## 2016-11-29 NOTE — Discharge Instructions (Signed)
You have a crush injury to the fingertip that has caused multiple small breaks to the fingertip bone. Keep the wound of the cuticle clean, dry, and covered. Wash only with warm, salty water. Change the dressing daily or as needed. Follow-up with Dr. Mack Guise for recheck in a week. Return for any signs of infection. Take the antibiotic as directed and the pain medicine as needed.
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
## 2016-12-07 DIAGNOSIS — Z940 Kidney transplant status: Secondary | ICD-10-CM | POA: Diagnosis not present

## 2016-12-07 DIAGNOSIS — Z9012 Acquired absence of left breast and nipple: Secondary | ICD-10-CM | POA: Diagnosis not present

## 2016-12-07 DIAGNOSIS — R928 Other abnormal and inconclusive findings on diagnostic imaging of breast: Secondary | ICD-10-CM | POA: Diagnosis not present

## 2016-12-07 DIAGNOSIS — Z79810 Long term (current) use of selective estrogen receptor modulators (SERMs): Secondary | ICD-10-CM | POA: Diagnosis not present

## 2016-12-07 DIAGNOSIS — I120 Hypertensive chronic kidney disease with stage 5 chronic kidney disease or end stage renal disease: Secondary | ICD-10-CM | POA: Diagnosis not present

## 2016-12-07 DIAGNOSIS — Z8673 Personal history of transient ischemic attack (TIA), and cerebral infarction without residual deficits: Secondary | ICD-10-CM | POA: Diagnosis not present

## 2016-12-07 DIAGNOSIS — D0512 Intraductal carcinoma in situ of left breast: Secondary | ICD-10-CM | POA: Diagnosis not present

## 2016-12-07 DIAGNOSIS — R922 Inconclusive mammogram: Secondary | ICD-10-CM | POA: Diagnosis not present

## 2016-12-07 DIAGNOSIS — S62600D Fracture of unspecified phalanx of right index finger, subsequent encounter for fracture with routine healing: Secondary | ICD-10-CM | POA: Diagnosis not present

## 2016-12-07 DIAGNOSIS — Z9289 Personal history of other medical treatment: Secondary | ICD-10-CM | POA: Diagnosis not present

## 2016-12-07 DIAGNOSIS — Z08 Encounter for follow-up examination after completed treatment for malignant neoplasm: Secondary | ICD-10-CM | POA: Diagnosis not present

## 2016-12-07 DIAGNOSIS — N186 End stage renal disease: Secondary | ICD-10-CM | POA: Diagnosis not present

## 2016-12-07 DIAGNOSIS — Z803 Family history of malignant neoplasm of breast: Secondary | ICD-10-CM | POA: Diagnosis not present

## 2016-12-07 DIAGNOSIS — Z86000 Personal history of in-situ neoplasm of breast: Secondary | ICD-10-CM | POA: Diagnosis not present

## 2016-12-07 DIAGNOSIS — Z841 Family history of disorders of kidney and ureter: Secondary | ICD-10-CM | POA: Diagnosis not present

## 2016-12-07 DIAGNOSIS — K219 Gastro-esophageal reflux disease without esophagitis: Secondary | ICD-10-CM | POA: Diagnosis not present

## 2016-12-07 DIAGNOSIS — Z923 Personal history of irradiation: Secondary | ICD-10-CM | POA: Diagnosis not present

## 2016-12-07 DIAGNOSIS — Z905 Acquired absence of kidney: Secondary | ICD-10-CM | POA: Diagnosis not present

## 2016-12-10 DIAGNOSIS — S62660A Nondisplaced fracture of distal phalanx of right index finger, initial encounter for closed fracture: Secondary | ICD-10-CM | POA: Diagnosis not present

## 2016-12-15 ENCOUNTER — Ambulatory Visit: Payer: Medicare Other | Admitting: Dietician

## 2016-12-16 ENCOUNTER — Telehealth: Payer: Self-pay | Admitting: Dietician

## 2016-12-16 NOTE — Telephone Encounter (Signed)
Called patient to reschedule appointment, which she missed on 12/15/16. She reports several close friends have passed away recently and she has been busy dealing with related issues. She states she will call back later to reschedule.

## 2016-12-24 ENCOUNTER — Encounter: Payer: Self-pay | Admitting: Dietician

## 2016-12-24 NOTE — Progress Notes (Signed)
Have not heard back from patient to reschedule her cancelled appointment; sent discharge letter to MD.

## 2016-12-25 ENCOUNTER — Ambulatory Visit (INDEPENDENT_AMBULATORY_CARE_PROVIDER_SITE_OTHER): Payer: Medicare Other | Admitting: Family Medicine

## 2016-12-25 ENCOUNTER — Encounter: Payer: Self-pay | Admitting: Family Medicine

## 2016-12-25 VITALS — BP 123/80 | HR 79 | Temp 98.8°F | Wt 216.0 lb

## 2016-12-25 DIAGNOSIS — Z940 Kidney transplant status: Secondary | ICD-10-CM | POA: Diagnosis not present

## 2016-12-25 DIAGNOSIS — J069 Acute upper respiratory infection, unspecified: Secondary | ICD-10-CM | POA: Diagnosis not present

## 2016-12-25 DIAGNOSIS — E559 Vitamin D deficiency, unspecified: Secondary | ICD-10-CM

## 2016-12-25 LAB — UA/M W/RFLX CULTURE, ROUTINE
Bilirubin, UA: NEGATIVE
GLUCOSE, UA: NEGATIVE
Ketones, UA: NEGATIVE
LEUKOCYTES UA: NEGATIVE
Nitrite, UA: NEGATIVE
PH UA: 6 (ref 5.0–7.5)
Specific Gravity, UA: 1.015 (ref 1.005–1.030)
Urobilinogen, Ur: 0.2 mg/dL (ref 0.2–1.0)

## 2016-12-25 LAB — MICROSCOPIC EXAMINATION

## 2016-12-25 MED ORDER — DOXYCYCLINE HYCLATE 100 MG PO TABS
100.0000 mg | ORAL_TABLET | Freq: Two times a day (BID) | ORAL | 0 refills | Status: DC
Start: 2016-12-25 — End: 2017-01-28

## 2016-12-25 NOTE — Assessment & Plan Note (Signed)
Recently finished course of supplementation. Would like to have levels rechecked.

## 2016-12-25 NOTE — Assessment & Plan Note (Signed)
Await basic labs, will send on to transplant specialist when they come back.
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
## 2016-12-26 LAB — COMPREHENSIVE METABOLIC PANEL
A/G RATIO: 1.4 (ref 1.2–2.2)
ALT: 12 IU/L (ref 0–32)
AST: 15 IU/L (ref 0–40)
Albumin: 4.3 g/dL (ref 3.5–5.5)
Alkaline Phosphatase: 41 IU/L (ref 39–117)
BUN/Creatinine Ratio: 14 (ref 9–23)
BUN: 27 mg/dL — ABNORMAL HIGH (ref 6–24)
Bilirubin Total: 0.2 mg/dL (ref 0.0–1.2)
CO2: 19 mmol/L (ref 18–29)
Calcium: 9.9 mg/dL (ref 8.7–10.2)
Chloride: 104 mmol/L (ref 96–106)
Creatinine, Ser: 1.88 mg/dL — ABNORMAL HIGH (ref 0.57–1.00)
GFR calc non Af Amer: 31 mL/min/{1.73_m2} — ABNORMAL LOW (ref >59)
GFR, EST AFRICAN AMERICAN: 35 mL/min/{1.73_m2} — AB (ref >59)
Globulin, Total: 3 g/dL (ref 1.5–4.5)
Glucose: 162 mg/dL — ABNORMAL HIGH (ref 65–99)
POTASSIUM: 4.6 mmol/L (ref 3.5–5.2)
Sodium: 136 mmol/L (ref 134–144)
Total Protein: 7.3 g/dL (ref 6.0–8.5)

## 2016-12-26 LAB — CBC WITH DIFFERENTIAL/PLATELET
BASOS: 1 %
Basophils Absolute: 0 10*3/uL (ref 0.0–0.2)
EOS (ABSOLUTE): 0.2 10*3/uL (ref 0.0–0.4)
EOS: 3 %
HEMATOCRIT: 36.4 % (ref 34.0–46.6)
Hemoglobin: 11.8 g/dL (ref 11.1–15.9)
IMMATURE GRANS (ABS): 0 10*3/uL (ref 0.0–0.1)
IMMATURE GRANULOCYTES: 0 %
LYMPHS: 21 %
Lymphocytes Absolute: 1.2 10*3/uL (ref 0.7–3.1)
MCH: 28.9 pg (ref 26.6–33.0)
MCHC: 32.4 g/dL (ref 31.5–35.7)
MCV: 89 fL (ref 79–97)
Monocytes Absolute: 0.4 10*3/uL (ref 0.1–0.9)
Monocytes: 6 %
NEUTROS PCT: 69 %
Neutrophils Absolute: 4.2 10*3/uL (ref 1.4–7.0)
PLATELETS: 211 10*3/uL (ref 150–379)
RBC: 4.08 x10E6/uL (ref 3.77–5.28)
RDW: 13.8 % (ref 12.3–15.4)
WBC: 6 10*3/uL (ref 3.4–10.8)

## 2016-12-26 LAB — VITAMIN D 25 HYDROXY (VIT D DEFICIENCY, FRACTURES): Vit D, 25-Hydroxy: 34.3 ng/mL (ref 30.0–100.0)

## 2016-12-30 ENCOUNTER — Telehealth: Payer: Self-pay | Admitting: Family Medicine

## 2016-12-30 NOTE — Telephone Encounter (Signed)
No answer, no voicemail box.

## 2016-12-30 NOTE — Telephone Encounter (Signed)
Please call and let her know her vitamin D has improved greatly with supplementation and that we are faxing her other lab results to her kidney specialist

## 2016-12-30 NOTE — Telephone Encounter (Signed)
Pt would like a call back regarding results 

## 2016-12-30 NOTE — Telephone Encounter (Signed)
Patient notified, lab results faxed.

## 2016-12-31 MED ORDER — FLUCONAZOLE 150 MG PO TABS: 150.0000 mg | ORAL_TABLET | Freq: Once | ORAL | 0 refills | Status: AC

## 2016-12-31 NOTE — Telephone Encounter (Signed)
Diflucan sent

## 2016-12-31 NOTE — Telephone Encounter (Signed)
I spoke with patient and she said for the past 5 days she's been having a white cloudy discharge, itching, tingling, and sensitive. She says it feels like her last yeast infection and wants to know if you will call in diflucan for her.

## 2017-01-06 ENCOUNTER — Telehealth: Payer: Self-pay | Admitting: Family Medicine

## 2017-01-06 NOTE — Telephone Encounter (Signed)
Called pt to schedule Annual Wellness Visit with [facility]  - knb  °

## 2017-01-28 ENCOUNTER — Ambulatory Visit (INDEPENDENT_AMBULATORY_CARE_PROVIDER_SITE_OTHER): Payer: Self-pay | Admitting: Family Medicine

## 2017-01-28 ENCOUNTER — Ambulatory Visit: Payer: Medicare Other | Admitting: Family Medicine

## 2017-01-28 ENCOUNTER — Encounter: Payer: Self-pay | Admitting: Family Medicine

## 2017-01-28 VITALS — BP 122/79 | HR 81 | Temp 98.6°F | Wt 219.0 lb

## 2017-01-28 DIAGNOSIS — Z940 Kidney transplant status: Secondary | ICD-10-CM

## 2017-01-28 DIAGNOSIS — M25512 Pain in left shoulder: Secondary | ICD-10-CM

## 2017-01-28 LAB — UA/M W/RFLX CULTURE, ROUTINE
Bilirubin, UA: NEGATIVE
Glucose, UA: NEGATIVE
Leukocytes, UA: NEGATIVE
Nitrite, UA: NEGATIVE
PH UA: 6 (ref 5.0–7.5)
Specific Gravity, UA: 1.02 (ref 1.005–1.030)
UUROB: 0.2 mg/dL (ref 0.2–1.0)

## 2017-01-28 LAB — MICROSCOPIC EXAMINATION: WBC UA: NONE SEEN /HPF (ref 0–?)

## 2017-01-28 MED ORDER — HYDROCODONE-ACETAMINOPHEN 5-325 MG PO TABS: 1.0000 | ORAL_TABLET | Freq: Three times a day (TID) | ORAL | 0 refills | Status: DC | PRN
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
## 2017-01-29 ENCOUNTER — Telehealth: Payer: Self-pay | Admitting: Family Medicine

## 2017-01-29 LAB — CBC WITH DIFFERENTIAL/PLATELET
BASOS ABS: 0 10*3/uL (ref 0.0–0.2)
Basos: 0 %
EOS (ABSOLUTE): 0.1 10*3/uL (ref 0.0–0.4)
Eos: 2 %
HEMOGLOBIN: 12.1 g/dL (ref 11.1–15.9)
Hematocrit: 38.1 % (ref 34.0–46.6)
IMMATURE GRANULOCYTES: 0 %
Immature Grans (Abs): 0 10*3/uL (ref 0.0–0.1)
LYMPHS ABS: 1.9 10*3/uL (ref 0.7–3.1)
LYMPHS: 26 %
MCH: 28.5 pg (ref 26.6–33.0)
MCHC: 31.8 g/dL (ref 31.5–35.7)
MCV: 90 fL (ref 79–97)
MONOCYTES: 7 %
Monocytes Absolute: 0.5 10*3/uL (ref 0.1–0.9)
NEUTROS PCT: 65 %
Neutrophils Absolute: 4.7 10*3/uL (ref 1.4–7.0)
Platelets: 223 10*3/uL (ref 150–379)
RBC: 4.24 x10E6/uL (ref 3.77–5.28)
RDW: 14.4 % (ref 12.3–15.4)
WBC: 7.3 10*3/uL (ref 3.4–10.8)

## 2017-01-29 LAB — COMPREHENSIVE METABOLIC PANEL
A/G RATIO: 1.5 (ref 1.2–2.2)
ALBUMIN: 4.3 g/dL (ref 3.5–5.5)
ALT: 13 IU/L (ref 0–32)
AST: 15 IU/L (ref 0–40)
Alkaline Phosphatase: 46 IU/L (ref 39–117)
BUN / CREAT RATIO: 13 (ref 9–23)
BUN: 26 mg/dL — AB (ref 6–24)
CHLORIDE: 102 mmol/L (ref 96–106)
CO2: 19 mmol/L — ABNORMAL LOW (ref 20–29)
Calcium: 10.1 mg/dL (ref 8.7–10.2)
Creatinine, Ser: 2.08 mg/dL — ABNORMAL HIGH (ref 0.57–1.00)
GFR calc non Af Amer: 27 mL/min/{1.73_m2} — ABNORMAL LOW (ref >59)
GFR, EST AFRICAN AMERICAN: 31 mL/min/{1.73_m2} — AB (ref >59)
GLOBULIN, TOTAL: 2.8 g/dL (ref 1.5–4.5)
GLUCOSE: 119 mg/dL — AB (ref 65–99)
Potassium: 5 mmol/L (ref 3.5–5.2)
SODIUM: 139 mmol/L (ref 134–144)
TOTAL PROTEIN: 7.1 g/dL (ref 6.0–8.5)

## 2017-01-29 NOTE — Telephone Encounter (Signed)
Labs faxed to Dr. Herma Ard.

## 2017-01-29 NOTE — Telephone Encounter (Signed)
Please send a copy of yesterday's labs over to her transplant specialist. Thanks!

## 2017-01-29 NOTE — Assessment & Plan Note (Signed)
Labs drawn today, await results. Will fax to her transplant specialist.

## 2017-02-02 MED FILL — PROGRAF/1MG/CAP: PROGRAF/1MG/CAP | 30 days supply | Qty: 240 | Fill #3

## 2017-02-02 MED FILL — MYFORTIC/180MG/TAB: MYFORTIC/180MG/TAB | 30 days supply | Qty: 180 | Fill #3

## 2017-02-09 ENCOUNTER — Other Ambulatory Visit: Payer: Self-pay | Admitting: Family Medicine

## 2017-02-09 ENCOUNTER — Telehealth: Payer: Self-pay | Admitting: Family Medicine

## 2017-02-09 DIAGNOSIS — Z940 Kidney transplant status: Secondary | ICD-10-CM

## 2017-02-09 MED ORDER — LEVETIRACETAM 500 MG TABLET
ORAL_TABLET | Freq: Two times a day (BID) | ORAL | 11 refills | 0 days | Status: CP
Start: 2017-02-09 — End: 2018-02-04

## 2017-02-09 NOTE — Telephone Encounter (Signed)
I've put in standing orders for her to get her labs drawn here to be sent to Dr. Suzan Nailer (her transplant doctor). That being said- she does not have a follow up scheduled and is over due for a follow up on her diabetes. Can we please schedule her for around the end of the month so she can have her monthly labs and her follow up? Thanks!

## 2017-02-17 NOTE — Unmapped (Signed)
ADDED SPECIALTY DRUGS AND FIXED DATES

## 2017-02-22 NOTE — Telephone Encounter (Signed)
Scheduled

## 2017-02-22 NOTE — Telephone Encounter (Signed)
[name], can we try to get her scheduled?

## 2017-02-26 ENCOUNTER — Other Ambulatory Visit: Payer: Self-pay | Admitting: Family Medicine

## 2017-02-26 ENCOUNTER — Ambulatory Visit (INDEPENDENT_AMBULATORY_CARE_PROVIDER_SITE_OTHER): Payer: Medicare Other | Admitting: Family Medicine

## 2017-02-26 ENCOUNTER — Encounter: Payer: Self-pay | Admitting: Family Medicine

## 2017-02-26 VITALS — BP 141/82 | HR 88 | Temp 98.0°F | Wt 216.4 lb

## 2017-02-26 DIAGNOSIS — N898 Other specified noninflammatory disorders of vagina: Secondary | ICD-10-CM | POA: Diagnosis not present

## 2017-02-26 DIAGNOSIS — Z23 Encounter for immunization: Secondary | ICD-10-CM

## 2017-02-26 DIAGNOSIS — B9689 Other specified bacterial agents as the cause of diseases classified elsewhere: Secondary | ICD-10-CM

## 2017-02-26 DIAGNOSIS — Z111 Encounter for screening for respiratory tuberculosis: Secondary | ICD-10-CM | POA: Diagnosis not present

## 2017-02-26 DIAGNOSIS — Z940 Kidney transplant status: Secondary | ICD-10-CM | POA: Diagnosis not present

## 2017-02-26 DIAGNOSIS — E119 Type 2 diabetes mellitus without complications: Secondary | ICD-10-CM | POA: Diagnosis not present

## 2017-02-26 DIAGNOSIS — Z113 Encounter for screening for infections with a predominantly sexual mode of transmission: Secondary | ICD-10-CM | POA: Diagnosis not present

## 2017-02-26 DIAGNOSIS — N760 Acute vaginitis: Secondary | ICD-10-CM

## 2017-02-26 MED ORDER — METRONIDAZOLE 500 MG PO TABS: 500.0000 mg | ORAL_TABLET | Freq: Two times a day (BID) | ORAL | 0 refills | Status: DC

## 2017-02-26 NOTE — Patient Instructions (Addendum)
Pneumococcal Conjugate Vaccine (PCV13) What You Need to Know 1. Why get vaccinated? Vaccination can protect both children and adults from pneumococcal disease. Pneumococcal disease is caused by bacteria that can spread from person to person through close contact. It can cause ear infections, and it can also lead to more serious infections of the:  Lungs (pneumonia),  Blood (bacteremia), and  Covering of the brain and spinal cord (meningitis).  Pneumococcal pneumonia is most common among adults. Pneumococcal meningitis can cause deafness and brain damage, and it kills about 1 child in 10 who get it. Anyone can get pneumococcal disease, but children under 2 years of age and adults 65 years and older, people with certain medical conditions, and cigarette smokers are at the highest risk. Before there was a vaccine, the [place] saw:  more than 700 cases of meningitis,  about 13,000 blood infections,  about 5 million ear infections, and  about 200 deaths  in children under 5 each year from pneumococcal disease. Since vaccine became available, severe pneumococcal disease in these children has fallen by 88%. About 18,000 older adults die of pneumococcal disease each year in the [place]. Treatment of pneumococcal infections with penicillin and other drugs is not as effective as it used to be, because some strains of the disease have become resistant to these drugs. This makes prevention of the disease, through vaccination, even more important. 2. PCV13 vaccine Pneumococcal conjugate vaccine (called PCV13) protects against 13 types of pneumococcal bacteria. PCV13 is routinely given to children at 2, 4, 6, and 12-15 months of age. It is also recommended for children and adults 2 to 64 years of age with certain health conditions, and for all adults 65 years of age and older. Your doctor can give you details. 3. Some people should not get this vaccine Anyone who has ever had a  life-threatening allergic reaction to a dose of this vaccine, to an earlier pneumococcal vaccine called PCV7, or to any vaccine containing diphtheria toxoid (for example, DTaP), should not get PCV13. Anyone with a severe allergy to any component of PCV13 should not get the vaccine. Tell your doctor if the person being vaccinated has any severe allergies. If the person scheduled for vaccination is not feeling well, your healthcare provider might decide to reschedule the shot on another day. 4. Risks of a vaccine reaction With any medicine, including vaccines, there is a chance of reactions. These are usually mild and go away on their own, but serious reactions are also possible. Problems reported following PCV13 varied by age and dose in the series. The most common problems reported among children were:  About half became drowsy after the shot, had a temporary loss of appetite, or had redness or tenderness where the shot was given.  About 1 out of 3 had swelling where the shot was given.  About 1 out of 3 had a mild fever, and about 1 in 20 had a fever over 102.2F.  Up to about 8 out of 10 became fussy or irritable.  Adults have reported pain, redness, and swelling where the shot was given; also mild fever, fatigue, headache, chills, or muscle pain. Young children who get PCV13 along with inactivated flu vaccine at the same time may be at increased risk for seizures caused by fever. Ask your doctor for more information. Problems that could happen after any vaccine:  People sometimes faint after a medical procedure, including vaccination. Sitting or lying down for about 15 minutes can help prevent   fainting, and injuries caused by a fall. Tell your doctor if you feel dizzy, or have vision changes or ringing in the ears.  Some older children and adults get severe pain in the shoulder and have difficulty moving the arm where a shot was given. This happens very rarely.  Any medication can cause a  severe allergic reaction. Such reactions from a vaccine are very rare, estimated at about 1 in a million doses, and would happen within a few minutes to a few hours after the vaccination. As with any medicine, there is a very small chance of a vaccine causing a serious injury or death. The safety of vaccines is always being monitored. For more information, visit: www.cdc.gov/vaccinesafety/ 5. What if there is a serious reaction? What should I look for? Look for anything that concerns you, such as signs of a severe allergic reaction, very high fever, or unusual behavior. Signs of a severe allergic reaction can include hives, swelling of the face and throat, difficulty breathing, a fast heartbeat, dizziness, and weakness-usually within a few minutes to a few hours after the vaccination. What should I do?  If you think it is a severe allergic reaction or other emergency that can't wait, call 9-1-1 or get the person to the nearest hospital. Otherwise, call your doctor.  Reactions should be reported to the Vaccine Adverse Event Reporting System (VAERS). Your doctor should file this report, or you can do it yourself through the VAERS web site at www.vaers.hhs.gov, or by calling 1-800-822-7967. ? VAERS does not give medical advice. 6. The National Vaccine Injury Compensation Program The National Vaccine Injury Compensation Program (VICP) is a federal program that was created to compensate people who may have been injured by certain vaccines. Persons who believe they may have been injured by a vaccine can learn about the program and about filing a claim by calling 1-800-338-2382 or visiting the VICP website at www.hrsa.gov/vaccinecompensation. There is a time limit to file a claim for compensation. 7. How can I learn more?  Ask your healthcare provider. He or she can give you the vaccine package insert or suggest other sources of information.  Call your local or state health department.  Contact the  Centers for Disease Control and Prevention (CDC): ? Call 1-800-232-4636 (1-800-CDC-INFO) or ? Visit CDC's website at www.cdc.gov/vaccines Vaccine Information Statement, PCV13 Vaccine (06/07/2014) This information is not intended to replace advice given to you by your health care provider. Make sure you discuss any questions you have with your health care provider. Document Released: 05/17/2006 Document Revised: 04/09/2016 Document Reviewed: 04/09/2016 Elsevier Interactive Patient Education  2017 Elsevier Inc.  

## 2017-02-26 NOTE — Assessment & Plan Note (Signed)
Stable with A1c of 6.9. Continue diet and exercise. Recheck 3 months.

## 2017-02-26 NOTE — Progress Notes (Signed)
BP (!) 141/82 (BP Location: Right Arm, Patient Position: Sitting, Cuff Size: Normal)   Pulse 88   Temp 98 F (36.7 C)   Wt 216 lb 6 oz (98.1 kg)   LMP 03/03/2016   SpO2 97%   BMI 34.92 kg/m    Subjective:    Patient ID: [name], female    DOB: 06-Jun-1966, 51 y.o.   MRN: [id number]  HPI: [name] is a 51 y.o. female  Chief Complaint  Patient presents with  . Vaginal Discharge   DIABETES Hypoglycemic episodes:no Polydipsia/polyuria: no Visual disturbance: no Chest pain: no Paresthesias: no Glucose Monitoring: yes  Accucheck frequency: BID Taking Insulin?: no Blood Pressure Monitoring: not checking Retinal Examination: Not up to Date Foot Exam: Up to Date Diabetic Education: Not Completed Pneumovax: Given Today Influenza: Post-pone to Flu Season Aspirin: yes  VAGINAL IRRITATION and DISCHARGE Duration: 1 week Discharge description: cloudy  Pruritus: yes Dysuria: no Malodorous: no Urinary frequency: no Fevers: no Abdominal pain: no   Relevant past medical, surgical, family and social history reviewed and updated as indicated. Interim medical history since our last visit reviewed. Allergies and medications reviewed and updated.  Review of Systems  Constitutional: Negative.   Respiratory: Negative.  Negative for apnea.   Cardiovascular: Negative.   Genitourinary: Positive for vaginal discharge. Negative for decreased urine volume, difficulty urinating, dyspareunia, dysuria, enuresis, flank pain, frequency, genital sores, hematuria, menstrual problem, pelvic pain, urgency, vaginal bleeding and vaginal pain.  Psychiatric/Behavioral: Negative.     Per HPI unless specifically indicated above     Objective:    BP (!) 141/82 (BP Location: Right Arm, Patient Position: Sitting, Cuff Size: Normal)   Pulse 88   Temp 98 F (36.7 C)   Wt 216 lb 6 oz (98.1 kg)   LMP 03/03/2016   SpO2 97%   BMI 34.92 kg/m   Wt Readings from Last 3 Encounters:    02/26/17 216 lb 6 oz (98.1 kg)  01/28/17 219 lb (99.3 kg)  12/25/16 216 lb (98 kg)    Physical Exam  Constitutional: She is oriented to person, place, and time. She appears well-developed and well-nourished. No distress.  HENT:  Head: Normocephalic and atraumatic.  Right Ear: Hearing normal.  Left Ear: Hearing normal.  Nose: Nose normal.  Eyes: Conjunctivae and lids are normal. Right eye exhibits no discharge. Left eye exhibits no discharge. No scleral icterus.  Cardiovascular: Normal rate, regular rhythm, normal heart sounds and intact distal pulses.  Exam reveals no gallop and no friction rub.   No murmur heard. Pulmonary/Chest: Effort normal and breath sounds normal. No respiratory distress. She has no wheezes. She has no rales. She exhibits no tenderness.  Genitourinary: Vaginal discharge found.  Musculoskeletal: Normal range of motion.  Neurological: She is alert and oriented to person, place, and time.  Skin: Skin is warm, dry and intact. No rash noted. She is not diaphoretic. No erythema. No pallor.  Psychiatric: She has a normal mood and affect. Her speech is normal and behavior is normal. Judgment and thought content normal. Cognition and memory are normal.  Nursing note and vitals reviewed.   Results for orders placed or performed in visit on 01/28/17  Microscopic Examination  Result Value Ref Range   WBC, UA None seen 0 - 5 /hpf   RBC, UA 3-10 (A) 0 - 2 /hpf   Epithelial Cells (non renal) 0-10 0 - 10 /hpf   Renal Epithel, UA 0-10 (A) None seen /hpf  Bacteria, UA Few (A) None seen/Few  UA/M w/rflx Culture, Routine  Result Value Ref Range   Specific Gravity, UA 1.020 1.005 - 1.030   pH, UA 6.0 5.0 - 7.5   Color, UA Yellow Yellow   Appearance Ur Clear Clear   Leukocytes, UA Negative Negative   Protein, UA 1+ (A) Negative/Trace   Glucose, UA Negative Negative   Ketones, UA Trace (A) Negative   RBC, UA 2+ (A) Negative   Bilirubin, UA Negative Negative    Urobilinogen, Ur 0.2 0.2 - 1.0 mg/dL   Nitrite, UA Negative Negative   Microscopic Examination See below:   CBC with Differential/Platelet  Result Value Ref Range   WBC 7.3 3.4 - 10.8 x10E3/uL   RBC 4.24 3.77 - 5.28 x10E6/uL   Hemoglobin 12.1 11.1 - 15.9 g/dL   Hematocrit 38.1 34.0 - 46.6 %   MCV 90 79 - 97 fL   MCH 28.5 26.6 - 33.0 pg   MCHC 31.8 31.5 - 35.7 g/dL   RDW 14.4 12.3 - 15.4 %   Platelets 223 150 - 379 x10E3/uL   Neutrophils 65 Not Estab. %   Lymphs 26 Not Estab. %   Monocytes 7 Not Estab. %   Eos 2 Not Estab. %   Basos 0 Not Estab. %   Neutrophils Absolute 4.7 1.4 - 7.0 x10E3/uL   Lymphocytes Absolute 1.9 0.7 - 3.1 x10E3/uL   Monocytes Absolute 0.5 0.1 - 0.9 x10E3/uL   EOS (ABSOLUTE) 0.1 0.0 - 0.4 x10E3/uL   Basophils Absolute 0.0 0.0 - 0.2 x10E3/uL   Immature Granulocytes 0 Not Estab. %   Immature Grans (Abs) 0.0 0.0 - 0.1 x10E3/uL  Comprehensive metabolic panel  Result Value Ref Range   Glucose 119 (H) 65 - 99 mg/dL   BUN 26 (H) 6 - 24 mg/dL   Creatinine, Ser 2.08 (H) 0.57 - 1.00 mg/dL   GFR calc non Af Amer 27 (L) >59 mL/min/1.73   GFR calc Af Amer 31 (L) >59 mL/min/1.73   BUN/Creatinine Ratio 13 9 - 23   Sodium 139 134 - 144 mmol/L   Potassium 5.0 3.5 - 5.2 mmol/L   Chloride 102 96 - 106 mmol/L   CO2 19 (L) 20 - 29 mmol/L   Calcium 10.1 8.7 - 10.2 mg/dL   Total Protein 7.1 6.0 - 8.5 g/dL   Albumin 4.3 3.5 - 5.5 g/dL   Globulin, Total 2.8 1.5 - 4.5 g/dL   Albumin/Globulin Ratio 1.5 1.2 - 2.2   Bilirubin Total <0.2 0.0 - 1.2 mg/dL   Alkaline Phosphatase 46 39 - 117 IU/L   AST 15 0 - 40 IU/L   ALT 13 0 - 32 IU/L      Assessment & Plan:   Problem List Items Addressed This Visit      Endocrine   Diabetes mellitus type 2, diet-controlled ([place]) - Primary    Stable with A1c of 6.9. Continue diet and exercise. Recheck 3 months.       Relevant Orders   Bayer DCA Hb A1c Waived     Other   Renal transplant, status post    Checking labs today. Await  results. Continue to follow with transplant. Unable to find documentation of prevnar- will give today. Care everywhere and health department system checked.       Relevant Orders   Pneumococcal conjugate vaccine 13-valent (Completed)    Other Visit Diagnoses    Screening for STD (sexually transmitted disease)  Labs checked today. Await results.    Relevant Orders   GC/Chlamydia Probe Amp   Hepatitis C antibody   HSV(herpes simplex vrs) 1+2 ab-IgG   HIV antibody   RPR   Vaginal discharge       Wet prep checked today. Await results.    Relevant Orders   WET PREP FOR [place], YEAST, CLUE   Screening for tuberculosis       Quantiferon drawn today. Await results.    Relevant Orders   Quantiferon tb gold assay (blood)   BV (bacterial vaginosis)       Will treat with flagyl.    Relevant Medications   metroNIDAZOLE (FLAGYL) 500 MG tablet       Follow up plan: Return in about 3 months (around 05/29/2017) for Physical.

## 2017-02-26 NOTE — Assessment & Plan Note (Addendum)
Checking labs today. Await results. Continue to follow with transplant. Unable to find documentation of prevnar- will give today. Care everywhere and health department system checked.

## 2017-02-27 LAB — BASIC METABOLIC PANEL
BUN / CREAT RATIO: 16 (ref 9–23)
BUN: 30 mg/dL — ABNORMAL HIGH (ref 6–24)
CO2: 17 mmol/L — AB (ref 20–29)
Calcium: 10 mg/dL (ref 8.7–10.2)
Chloride: 101 mmol/L (ref 96–106)
Creatinine, Ser: 1.93 mg/dL — ABNORMAL HIGH (ref 0.57–1.00)
GFR, EST AFRICAN AMERICAN: 34 mL/min/{1.73_m2} — AB (ref >59)
GFR, EST NON AFRICAN AMERICAN: 30 mL/min/{1.73_m2} — AB (ref >59)
Glucose: 254 mg/dL — ABNORMAL HIGH (ref 65–99)
POTASSIUM: 5.6 mmol/L — AB (ref 3.5–5.2)
SODIUM: 136 mmol/L (ref 134–144)

## 2017-02-27 LAB — HSV(HERPES SIMPLEX VRS) I + II AB-IGG
HSV 1 Glycoprotein G Ab, IgG: <0.91 index (ref 0.00–0.90)
HSV 2 Glycoprotein G Ab, IgG: 8.19 index — ABNORMAL HIGH (ref 0.00–0.90)

## 2017-02-27 LAB — RPR: RPR: NONREACTIVE

## 2017-02-27 LAB — HIV ANTIBODY (ROUTINE TESTING W REFLEX): HIV Screen 4th Generation wRfx: NONREACTIVE

## 2017-02-27 LAB — HEPATITIS C ANTIBODY: Hep C Virus Ab: <0.1 s/co ratio (ref 0.0–0.9)

## 2017-03-01 ENCOUNTER — Telehealth: Payer: Self-pay | Admitting: Family Medicine

## 2017-03-01 LAB — GC/CHLAMYDIA PROBE AMP
Chlamydia trachomatis, NAA: NEGATIVE
Neisseria gonorrhoeae by PCR: NEGATIVE

## 2017-03-01 NOTE — Telephone Encounter (Signed)
Please let [name] know that her labs came back stable. She has been exposed to the genital herpes virus- but that doesn't mean she'll ever have an outbreak. If she does notice painful red bumps down there, call me. Everything else came back negative. Thanks!

## 2017-03-01 NOTE — Telephone Encounter (Signed)
Tried to call patient, no answer, unable to leave a message, will try again.

## 2017-03-02 LAB — WET PREP FOR TRICH, YEAST, CLUE
Clue Cell Exam: POSITIVE — AB
Trichomonas Exam: NEGATIVE
YEAST EXAM: NEGATIVE

## 2017-03-02 LAB — BAYER DCA HB A1C WAIVED: HB A1C (BAYER DCA - WAIVED): 6.9 % (ref <7.0)

## 2017-03-02 MED ORDER — VALACYCLOVIR HCL 1 G PO TABS: 1000.0000 mg | ORAL_TABLET | Freq: Two times a day (BID) | ORAL | 0 refills | Status: DC

## 2017-03-02 NOTE — Telephone Encounter (Signed)
Rx for valtrex sent to her pharmacy

## 2017-03-02 NOTE — Addendum Note (Signed)
Addended by: Valerie Roys on: 03/02/2017 10:04 AM   Modules accepted: Orders

## 2017-03-02 NOTE — Telephone Encounter (Signed)
Patient states that she has bumps in the vaginal area now, please send in a prescription.  [facility]

## 2017-03-03 ENCOUNTER — Encounter: Payer: Self-pay | Admitting: Family Medicine

## 2017-03-03 LAB — QUANTIFERON IN TUBE
QUANTIFERON MITOGEN VALUE: 6.11 [IU]/mL
QUANTIFERON NIL VALUE: 0.03 [IU]/mL
QUANTIFERON TB AG VALUE: 0.02 [IU]/mL
QUANTIFERON TB GOLD: NEGATIVE

## 2017-03-03 LAB — QUANTIFERON TB GOLD ASSAY (BLOOD)

## 2017-03-05 LAB — BASIC METABOLIC PANEL
BLOOD UREA NITROGEN: 16 mg/dL
CALCIUM: 10 mg/dL
CHLORIDE: 101 mmol/L
CREATININE: 1.93 mg/dL — ABNORMAL HIGH
EGFR MDRD AF AMER: 30 mL/min/{1.73_m2} — ABNORMAL LOW
POTASSIUM: 5.6 mmol/L — ABNORMAL HIGH
SODIUM: 136 mmol/L

## 2017-03-05 LAB — SODIUM: Lab: 136

## 2017-03-05 MED FILL — MYFORTIC/180MG/TAB: MYFORTIC/180MG/TAB | 30 days supply | Qty: 180 | Fill #4

## 2017-03-05 MED FILL — PROGRAF/1MG/CAP: PROGRAF/1MG/CAP | 30 days supply | Qty: 240 | Fill #4

## 2017-03-05 NOTE — Unmapped (Signed)
[facility] Refill and Clinical Coordination Note  Medication(s): Prograf 1 mg, Myfortic 180 mg    Arilyn Deonzella Fenster, DOB: 05-18-1966  Phone: [phone] (home) , Alternate phone contact: N/A  Shipping address: [address]  [place] [address]  Phone or address changes today?: No  All above HIPAA information verified.  Insurance changes? No    Completed refill and clinical call assessment today to schedule patient's medication shipment from the [facility] [phone]).      MEDICATION RECONCILIATION    Confirmed the medication and dosage are correct and have not changed: Yes, regimen is correct and unchanged.    Were there any changes to your medication(s) in the past month:  No, there are no changes reported at this time.    ADHERENCE    Is this medicine transplant or covered by Medicare Part B? Yes.    Prograf 1 mg   Quantity filled last month: 240   # of tablets left on hand: 40      Myfortic 180 mg   Quantity filled last month: 180   # of tablets left on hand: 30      Did you miss any doses in the past 4 weeks? No missed doses reported.  Adherence counseling provided? Not needed     SIDE EFFECT MANAGEMENT    Are you tolerating your medication?:  Lashondra reports tolerating the medication.  Side effect management discussed: None      Therapy is appropriate and should be continued.    Evidence of clinical benefit: See Epic note from 11/04/16      FINANCIAL/SHIPPING    Delivery Scheduled: Yes, Expected medication delivery date: Monday, Aug 6   Additional medications refilled: No additional medications/refills needed at this time.    Anvita did not have any additional questions at this time.    Delivery address validated in [facility] scheduling system: Yes, address listed above is correct.      We will follow up with patient monthly for standard refill processing and delivery.      Thank you,  Tawanna Solo [facility] Specialty Pharmacist

## 2017-03-05 NOTE — Unmapped (Signed)
Moving clinical call

## 2017-03-09 ENCOUNTER — Telehealth: Payer: Self-pay | Admitting: Family Medicine

## 2017-03-09 NOTE — Telephone Encounter (Signed)
Done

## 2017-03-10 ENCOUNTER — Ambulatory Visit
Admission: RE | Admit: 2017-03-10 | Discharge: 2017-03-10 | Disposition: A | Discharge status: Home / Self Care | Payer: Medicare Other | Source: Ambulatory Visit | Attending: Radiation Oncology | Admitting: Radiation Oncology

## 2017-03-10 ENCOUNTER — Encounter: Payer: Self-pay | Admitting: Radiation Oncology

## 2017-03-10 VITALS — BP 136/90 | HR 72 | Temp 97.2°F | Resp 20 | Wt 216.1 lb

## 2017-03-10 DIAGNOSIS — D0512 Intraductal carcinoma in situ of left breast: Secondary | ICD-10-CM | POA: Diagnosis not present

## 2017-03-10 DIAGNOSIS — Z923 Personal history of irradiation: Secondary | ICD-10-CM | POA: Diagnosis not present

## 2017-03-10 DIAGNOSIS — Z170 Estrogen receptor positive status [ER+]: Secondary | ICD-10-CM | POA: Insufficient documentation

## 2017-03-10 DIAGNOSIS — Z79810 Long term (current) use of selective estrogen receptor modulators (SERMs): Secondary | ICD-10-CM | POA: Insufficient documentation

## 2017-03-10 NOTE — Progress Notes (Signed)
Radiation Oncology Follow up Note  Name: [name]   Date:   03/10/2017 MRN:  [id number] DOB: 09/27/1965    This 51 y.o. 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 Seen today in routine follow-up she is doing well. She specifically denies breast tenderness cough or bone pain..her last mammogram was at [facility] BI-RADS 2 benign back in May 2018. She is currently ontamoxifen tolerating that well without side effect.  COMPLICATIONS OF TREATMENT: none  FOLLOW UP COMPLIANCE: keeps appointments   PHYSICAL EXAM:  BP 136/90   Pulse 72   Temp (!) 97.2 F (36.2 C)   Resp 20   Wt 216 lb 0.8 oz (98 kg)   LMP 03/03/2016   BMI 34.87 kg/m  Lungs are clear to A&P cardiac examination essentially unremarkable with regular rate and rhythm. No dominant mass or nodularity is noted in either breast in 2 positions examined. Incision is well-healed. No axillary or supraclavicular adenopathy is appreciated. Cosmetic result is excellent. Well-developed well-nourished patient in NAD. HEENT reveals PERLA, EOMI, discs not visualized.  Oral cavity is clear. No oral mucosal lesions are identified. Neck is clear without evidence of cervical or supraclavicular adenopathy. Lungs are clear to A&P. Cardiac examination is essentially unremarkable with regular rate and rhythm without murmur rub or thrill. Abdomen is benign with no organomegaly or masses noted. Motor sensory and DTR levels are equal and symmetric in the upper and lower extremities. Cranial nerves II through XII are grossly intact. Proprioception is intact. No peripheral adenopathy or edema is identified. No motor or sensory levels are noted. Crude visual fields are within  normal range.  RADIOLOGY RESULTS: mammograms are requested for my review report has been reviewed  PLAN: present time patient is doing well with no evidence of disease. I've asked to see her back in 1 year for follow-up. She is a rescheduled at [facility] for follow-up mammograms. She continues on tamoxifen without side effect. She continues to do well with no evidence of disease.patient is to call with any concerns.  I would like to take this opportunity to thank you for allowing me to participate in the care of your patient.Armstead Peaks., MD

## 2017-03-11 ENCOUNTER — Telehealth: Payer: Self-pay | Admitting: Family Medicine

## 2017-03-11 NOTE — Telephone Encounter (Signed)
Pt called and stated that the medication she was prescribed at her appt isn't helping. She would like a call back regarding other options. No other details given.

## 2017-03-11 NOTE — Telephone Encounter (Signed)
Called and left a message for patient to return my call.  

## 2017-03-11 NOTE — Telephone Encounter (Signed)
Patient states that she is still having symptoms of the infection that she was diagnosed with. She has finished all the antibiotic.  [employer].

## 2017-03-11 NOTE — Telephone Encounter (Signed)
Needs follow up appointment.  

## 2017-03-11 NOTE — Telephone Encounter (Signed)
[name]: Please get patient scheduled.

## 2017-03-12 NOTE — Telephone Encounter (Signed)
Called patient yesterday and today and not answer LVM to call us back to schedule an appointment with Dr. Wynetta Emery.

## 2017-03-17 ENCOUNTER — Ambulatory Visit (INDEPENDENT_AMBULATORY_CARE_PROVIDER_SITE_OTHER): Payer: Medicare Other | Admitting: Family Medicine

## 2017-03-17 ENCOUNTER — Encounter: Payer: Self-pay | Admitting: Family Medicine

## 2017-03-17 VITALS — BP 133/79 | HR 71 | Temp 98.7°F | Wt 213.0 lb

## 2017-03-17 DIAGNOSIS — N898 Other specified noninflammatory disorders of vagina: Secondary | ICD-10-CM

## 2017-03-17 MED ORDER — FLUCONAZOLE 150 MG PO TABS: 150.0000 mg | ORAL_TABLET | Freq: Once | ORAL | 0 refills | Status: AC

## 2017-03-17 NOTE — Patient Instructions (Signed)
Follow up as needed

## 2017-03-17 NOTE — Progress Notes (Signed)
   BP 133/79   Pulse 71   Temp 98.7 F (37.1 C)   Wt 213 lb (96.6 kg)   LMP 03/03/2016   SpO2 98%   BMI 34.38 kg/m    Subjective:    Patient ID: [name], female    DOB: 01/17/1966, 51 y.o.   MRN: [id number]  HPI: [name] is a 51 y.o. female  Chief Complaint  Patient presents with  . Vaginal Discharge    x 3 weeks thick and cloudy, itching. States it never really got better after the Flagyl.   Patient presents with 3 weeks of thick, clumpy vaginal discharge and vaginal itching. Was in several weeks ago with these sxs and was diagnosed with BV. Completed flagyl with no relief. Denies fever, chills, dysuria, pelvic pain, lesions.   Relevant past medical, surgical, family and social history reviewed and updated as indicated. Interim medical history since our last visit reviewed. Allergies and medications reviewed and updated.  Review of Systems  Constitutional: Negative.   HENT: Negative.   Respiratory: Negative.   Cardiovascular: Negative.   Gastrointestinal: Negative.   Genitourinary: Positive for vaginal discharge.       Vaginal itching  Musculoskeletal: Negative.   Neurological: Negative.   Psychiatric/Behavioral: Negative.    Per HPI unless specifically indicated above     Objective:    BP 133/79   Pulse 71   Temp 98.7 F (37.1 C)   Wt 213 lb (96.6 kg)   LMP 03/03/2016   SpO2 98%   BMI 34.38 kg/m   Wt Readings from Last 3 Encounters:  03/17/17 213 lb (96.6 kg)  03/10/17 216 lb 0.8 oz (98 kg)  02/26/17 216 lb 6 oz (98.1 kg)    Physical Exam  Constitutional: She is oriented to person, place, and time. She appears well-developed and well-nourished. No distress.  HENT:  Head: Atraumatic.  Eyes: Pupils are equal, round, and reactive to light. Conjunctivae are normal.  Neck: Normal range of motion. Neck supple.  Cardiovascular: Normal rate.   Pulmonary/Chest: Effort normal and breath sounds normal. No respiratory distress.  Musculoskeletal:  Normal range of motion.  Neurological: She is alert and oriented to person, place, and time.  Skin: Skin is warm and dry.  Psychiatric: She has a normal mood and affect. Her behavior is normal.  Nursing note and vitals reviewed.     Assessment & Plan:   Problem List Items Addressed This Visit    None    Visit Diagnoses    Vaginal discharge    -  Primary   No response to flagyl, will try a dose of diflucan and probiotics. Continue to monitor, call with continued sxs for repeat wet prep       Follow up plan: Return if symptoms worsen or fail to improve.

## 2017-03-29 NOTE — Unmapped (Signed)
[facility] Refill Coordination Note  Specialty Medication(s): Prograf 1mg  and Myfortic 180mg   Additional Medications shipped: none    Nancy Bowman, DOB: 1966/03/09  Phone: [phone] (home) , Alternate phone contact: N/A  Phone or address changes today?: No  All above HIPAA information was verified with patient.  Shipping Address: [address]  [place] [address]   Insurance changes? No    Completed refill call assessment today to schedule patient's medication shipment from the [facility] [phone]).      Confirmed the medication and dosage are correct and have not changed: Yes, regimen is correct and unchanged.    Confirmed patient started or stopped the following medications in the past month:  No, there are no changes reported at this time.    Are you tolerating your medication?:  Nancy Bowman reports tolerating the medication.    ADHERENCE    Prograf 1 mg   Quantity filled last month: 240   # of tablets left on hand: 56      Myfortic 180 mg   Quantity filled last month: 180   # of tablets left on hand: 42      Did you miss any doses in the past 4 weeks? No missed doses reported.    FINANCIAL/SHIPPING    Delivery Scheduled: Yes, Expected medication delivery date: 04/02/17     Nancy Bowman did not have any additional questions at this time.    Delivery address validated in FSI scheduling system: Yes, address listed in FSI is correct.    We will follow up with patient monthly for standard refill processing and delivery.      Thank you,  Rollen Sox   [facility] Specialty Pharmacist

## 2017-04-01 MED FILL — PROGRAF/1MG/CAP: PROGRAF/1MG/CAP | 30 days supply | Qty: 240 | Fill #5

## 2017-04-01 MED FILL — MYFORTIC/180MG/TAB: MYFORTIC/180MG/TAB | 30 days supply | Qty: 180 | Fill #5

## 2017-04-08 ENCOUNTER — Telehealth: Payer: Self-pay | Admitting: Family Medicine

## 2017-04-08 ENCOUNTER — Ambulatory Visit (INDEPENDENT_AMBULATORY_CARE_PROVIDER_SITE_OTHER): Payer: Medicare Other | Admitting: Family Medicine

## 2017-04-08 ENCOUNTER — Encounter: Payer: Self-pay | Admitting: Family Medicine

## 2017-04-08 VITALS — BP 96/64 | HR 73 | Temp 98.3°F | Wt 214.0 lb

## 2017-04-08 DIAGNOSIS — S61309A Unspecified open wound of unspecified finger with damage to nail, initial encounter: Secondary | ICD-10-CM

## 2017-04-08 DIAGNOSIS — S62660A Nondisplaced fracture of distal phalanx of right index finger, initial encounter for closed fracture: Secondary | ICD-10-CM | POA: Diagnosis not present

## 2017-04-08 NOTE — Progress Notes (Signed)
BP 96/64   Pulse 73   Temp 98.3 F (36.8 C)   Wt 214 lb (97.1 kg)   LMP 03/03/2016   SpO2 99%   BMI 34.54 kg/m    Subjective:    Patient ID: [name], female    DOB: 07-31-1966, 52 y.o.   MRN: [id number]  HPI: [name] is a 51 y.o. female  Chief Complaint  Patient presents with  . Hand Pain    slammed finger in car door back in April and the other day the nail got hung on something and ripped it up and now having throbbing pain radiating from finger up to her hand/arm.   FINGER PAIN- slammed her finger in the car door in April. Now nail is breaking off and causing her a lot of pain. Also broke the top part of her finger. Not wearing a brace. Didn't stay to see orthopedist for last appointment and hasn't seen him since.  Duration: months Involved hand: right Mechanism of injury: trauma- slammed finger in the door of her car in April.  Location: above DIP joint Onset: sudden Severity: severe  Quality: aching, throbbing, sharp Frequency: constant Radiation: yes Relief with NSAIDs?: No NSAIDs Taken Weakness: no Numbness: no Redness: no Swelling:yes Bruising: yes Fevers: no  Relevant past medical, surgical, family and social history reviewed and updated as indicated. Interim medical history since our last visit reviewed. Allergies and medications reviewed and updated.  Review of Systems  Constitutional: Negative.   Respiratory: Negative.   Cardiovascular: Negative.   Musculoskeletal: Positive for arthralgias. Negative for back pain, gait problem, joint swelling, myalgias, neck pain and neck stiffness.  Skin: Negative.   Psychiatric/Behavioral: Negative.     Per HPI unless specifically indicated above     Objective:    BP 96/64   Pulse 73   Temp 98.3 F (36.8 C)   Wt 214 lb (97.1 kg)   LMP 03/03/2016   SpO2 99%   BMI 34.54 kg/m   Wt Readings from Last 3 Encounters:  04/08/17 214 lb (97.1 kg)  03/17/17 213 lb (96.6 kg)  03/10/17 216 lb  0.8 oz (98 kg)    Physical Exam  Constitutional: She is oriented to person, place, and time. She appears well-developed and well-nourished. No distress.  HENT:  Head: Normocephalic and atraumatic.  Right Ear: Hearing normal.  Left Ear: Hearing normal.  Nose: Nose normal.  Eyes: Conjunctivae and lids are normal. Right eye exhibits no discharge. Left eye exhibits no discharge. No scleral icterus.  Cardiovascular: Normal rate, regular rhythm, normal heart sounds and intact distal pulses.  Exam reveals no gallop and no friction rub.   No murmur heard. Pulmonary/Chest: Effort normal and breath sounds normal. No respiratory distress. She has no wheezes. She has no rales. She exhibits no tenderness.  Musculoskeletal: Normal range of motion.  Neurological: She is alert and oriented to person, place, and time.  Skin: Skin is warm, dry and intact. No rash noted. She is not diaphoretic. No erythema. No pallor.  R 1st finger nail avulsion with 1/2 nail broken off, very tender to touch.  Psychiatric: She has a normal mood and affect. Her speech is normal and behavior is normal. Judgment and thought content normal. Cognition and memory are normal.  Nursing note and vitals reviewed.   Results for orders placed or performed in visit on 02/26/17  Bayer DCA Hb A1c Waived  Result Value Ref Range   Bayer DCA Hb A1c Waived 6.9 <7.0 %  Assessment & Plan:   Problem List Items Addressed This Visit    None    Visit Diagnoses    Avulsion of fingernail, initial encounter    -  Primary   Nail removed today after digital block with good results.    Closed nondisplaced fracture of distal phalanx of right index finger, initial encounter       To see orthopedics next week. Call with any concerns.        Follow up plan: Return if symptoms worsen or fail to improve.

## 2017-04-08 NOTE — Telephone Encounter (Signed)
Needs appointment

## 2017-04-08 NOTE — Telephone Encounter (Signed)
Patient called to see if she can get a medication for her finger jam. Patient stated the pain is going up into her hand more ad causing her to get get headaches.  Please Advise.  Thank you

## 2017-04-08 NOTE — Telephone Encounter (Signed)
Routing to provider  

## 2017-04-08 NOTE — Progress Notes (Signed)
   BP 96/64   Pulse 73   Temp 98.3 F (36.8 C)   Wt 214 lb (97.1 kg)   LMP 03/03/2016   SpO2 99%   BMI 34.54 kg/m    Subjective:    Patient ID: [name], female    DOB: [birth year], 51 y.o.   MRN: [id number]  HPI: [name] is a 51 y.o. female  Chief Complaint  Patient presents with  . Hand Pain    slammed finger in car door back in April and the other day the nail got hung on something and ripped it up and now having throbbing pain radiating from finger up to her hand/arm.    Relevant past medical, surgical, family and social history reviewed and updated as indicated. Interim medical history since our last visit reviewed. Allergies and medications reviewed and updated.  Review of Systems  Per HPI unless specifically indicated above     Objective:    BP 96/64   Pulse 73   Temp 98.3 F (36.8 C)   Wt 214 lb (97.1 kg)   LMP 03/03/2016   SpO2 99%   BMI 34.54 kg/m   Wt Readings from Last 3 Encounters:  04/08/17 214 lb (97.1 kg)  03/17/17 213 lb (96.6 kg)  03/10/17 216 lb 0.8 oz (98 kg)    Physical Exam  Results for orders placed or performed in visit on 02/26/17  Bayer DCA Hb A1c Waived  Result Value Ref Range   Bayer DCA Hb A1c Waived 6.9 <7.0 %      Assessment & Plan:   Problem List Items Addressed This Visit    None       Follow up plan: No Follow-up on file.

## 2017-04-08 NOTE — Telephone Encounter (Signed)
Please get patient scheduled.  °

## 2017-04-08 NOTE — Telephone Encounter (Signed)
Patient scheduled 04/08/2017 at 2:00 pm.

## 2017-04-13 DIAGNOSIS — S62660D Nondisplaced fracture of distal phalanx of right index finger, subsequent encounter for fracture with routine healing: Secondary | ICD-10-CM | POA: Diagnosis not present

## 2017-04-22 NOTE — Unmapped (Signed)
[facility] Refill Coordination Note  Specialty Medication(s): PROGRAF 1MG  AND MYFORTIC 180MG   Additional Medications shipped: NO    Nancy Bowman Billing, DOB: 12/27/1965  Phone: [phone] (home) , Alternate phone contact: N/A  Phone or address changes today?: No  All above HIPAA information was verified with patient.  Shipping Address: [address]  [place] [address]   Insurance changes? No    Completed refill call assessment today to schedule patient's medication shipment from the [facility] [phone]).      Confirmed the medication and dosage are correct and have not changed: Yes, regimen is correct and unchanged.    Confirmed patient started or stopped the following medications in the past month:  No, there are no changes reported at this time.    Are you tolerating your medication?:  Nancy Bowman reports tolerating the medication.    ADHERENCE    Prograf 1 mg   Quantity filled last month: 240   # of tablets left on hand: 80      Myfortic 180 mg   Quantity filled last month: 180   # of tablets left on hand: 60    Did you miss any doses in the past 4 weeks? No missed doses reported.    FINANCIAL/SHIPPING    Delivery Scheduled: Yes, Expected medication delivery date: 04/28/17     Nancy Bowman did not have any additional questions at this time.    Delivery address validated in FSI scheduling system: Yes, address listed in FSI is correct.    We will follow up with patient monthly for standard refill processing and delivery.      Thank you,  Marletta Lor   [facility] Specialty Pharmacist

## 2017-04-27 MED FILL — PROGRAF/1MG/CAP: PROGRAF/1MG/CAP | 30 days supply | Qty: 240 | Fill #6

## 2017-04-27 MED FILL — MYFORTIC/180MG/TAB: MYFORTIC/180MG/TAB | 30 days supply | Qty: 180 | Fill #6

## 2017-05-06 ENCOUNTER — Telehealth: Payer: Self-pay | Admitting: Family Medicine

## 2017-05-06 NOTE — Telephone Encounter (Signed)
Patient informed about Annual Wellness Visit. Patient does not want to schedule visit at the time but did want to be provider the call back number in the case she would like to schedule visit.

## 2017-05-06 NOTE — Telephone Encounter (Signed)
Called and let patient know what Dr. Wynetta Emery said and that she could just stop by for a lab visit monthly. Patient verbalized understanding.

## 2017-05-06 NOTE — Telephone Encounter (Signed)
Called pt to sched for Annual Wellness Visit with Nurse Health Advisor. C/b #  [phone]  Kathryn Brown ° °

## 2017-05-06 NOTE — Telephone Encounter (Signed)
Routing to provider  

## 2017-05-06 NOTE — Telephone Encounter (Signed)
There is a standing lab order for her to come in monthly. She does need to see me next month for her physical, which is already scheduled.

## 2017-05-06 NOTE — Telephone Encounter (Signed)
Patient calling in regards to getting labs done every month. Patient did not remember if provider told her to come in to be put on lab schedule or if she needed to make an appointment with provider. Patient unsure of what labs she gets done but is aware she comes in every month for labs.  Patient would like a call back in regards to what should be done for labs.  Please Advise.  Thank you

## 2017-05-20 NOTE — Unmapped (Signed)
[facility] Refill Coordination Note  Specialty Medication(s): Myfortic 180mg  & Prograf 1mg       Rebakah Cokley, DOB: [birth year]  Phone: [phone] (home) , Alternate phone contact: N/A  Phone or address changes today?: Yes  All above HIPAA information was verified with patient.  Shipping Address: [address]  [place] [address]   Insurance changes? No    Completed refill call assessment today to schedule patient's medication shipment from the [facility] [phone]).      Confirmed the medication and dosage are correct and have not changed: Yes, regimen is correct and unchanged.    Confirmed patient started or stopped the following medications in the past month:  No, there are no changes reported at this time.    Are you tolerating your medication?:  Cinderella reports tolerating the medication.    ADHERENCE    (Below is required for Medicare Part B or Transplant patients only - per drug):   How many tablets were dispensed last month:   Prograf 1 mg   Quantity filled last month: 240   # of tablets left on hand: Patient was not home, but stated she has about a week left on her meds    Myfortic 180 mg   Quantity filled last month: 180   # of tablets left on hand: Patient was not home, but stated she has about a week left on her meds    Did you miss any doses in the past 4 weeks? No missed doses reported.    FINANCIAL/SHIPPING    Delivery Scheduled: Yes, Expected medication delivery date: 05/25/17     Dawsyn did not have any additional questions at this time.    Delivery address validated in FSI scheduling system: Yes, address listed in FSI is correct.    We will follow up with patient monthly for standard refill processing and delivery.      Thank you,  Tamala Fothergill   [facility] Specialty Technician

## 2017-05-23 MED FILL — MYFORTIC/180MG/TAB: MYFORTIC/180MG/TAB | 30 days supply | Qty: 180 | Fill #7

## 2017-05-23 MED FILL — PROGRAF/1MG/CAP: PROGRAF/1MG/CAP | 30 days supply | Qty: 240 | Fill #7

## 2017-05-27 NOTE — Unmapped (Signed)
Opened in error Augustin Coupe May 26, 2017 8:57 AM

## 2017-06-03 ENCOUNTER — Ambulatory Visit (INDEPENDENT_AMBULATORY_CARE_PROVIDER_SITE_OTHER): Payer: Medicare Other

## 2017-06-03 ENCOUNTER — Encounter: Payer: Self-pay | Admitting: Family Medicine

## 2017-06-03 ENCOUNTER — Ambulatory Visit (INDEPENDENT_AMBULATORY_CARE_PROVIDER_SITE_OTHER): Payer: Medicare Other | Admitting: Family Medicine

## 2017-06-03 VITALS — BP 134/81 | HR 79 | Temp 98.1°F | Ht 66.0 in | Wt 219.2 lb

## 2017-06-03 VITALS — BP 134/81 | HR 79 | Temp 98.1°F | Resp 16 | Ht 66.0 in | Wt 219.4 lb

## 2017-06-03 DIAGNOSIS — N051 Unspecified nephritic syndrome with focal and segmental glomerular lesions: Secondary | ICD-10-CM

## 2017-06-03 DIAGNOSIS — Z940 Kidney transplant status: Secondary | ICD-10-CM | POA: Diagnosis not present

## 2017-06-03 DIAGNOSIS — Z23 Encounter for immunization: Secondary | ICD-10-CM | POA: Diagnosis not present

## 2017-06-03 DIAGNOSIS — Z1331 Encounter for screening for depression: Secondary | ICD-10-CM

## 2017-06-03 DIAGNOSIS — Z0000 Encounter for general adult medical examination without abnormal findings: Secondary | ICD-10-CM

## 2017-06-03 DIAGNOSIS — E559 Vitamin D deficiency, unspecified: Secondary | ICD-10-CM

## 2017-06-03 DIAGNOSIS — G40909 Epilepsy, unspecified, not intractable, without status epilepticus: Secondary | ICD-10-CM

## 2017-06-03 DIAGNOSIS — N183 Chronic kidney disease, stage 3 unspecified: Secondary | ICD-10-CM

## 2017-06-03 DIAGNOSIS — I471 Supraventricular tachycardia: Secondary | ICD-10-CM

## 2017-06-03 DIAGNOSIS — N390 Urinary tract infection, site not specified: Secondary | ICD-10-CM | POA: Diagnosis not present

## 2017-06-03 DIAGNOSIS — E119 Type 2 diabetes mellitus without complications: Secondary | ICD-10-CM

## 2017-06-03 DIAGNOSIS — I129 Hypertensive chronic kidney disease with stage 1 through stage 4 chronic kidney disease, or unspecified chronic kidney disease: Secondary | ICD-10-CM

## 2017-06-03 DIAGNOSIS — D0512 Intraductal carcinoma in situ of left breast: Secondary | ICD-10-CM

## 2017-06-03 DIAGNOSIS — Z1211 Encounter for screening for malignant neoplasm of colon: Secondary | ICD-10-CM | POA: Diagnosis not present

## 2017-06-03 MED ORDER — METFORMIN HCL ER 500 MG PO TB24: 500.0000 mg | ORAL_TABLET | Freq: Every day | ORAL | 3 refills | Status: DC

## 2017-06-03 MED ORDER — VALACYCLOVIR HCL 1 G PO TABS: 1000.0000 mg | ORAL_TABLET | Freq: Two times a day (BID) | ORAL | 12 refills | Status: DC

## 2017-06-03 NOTE — Assessment & Plan Note (Signed)
Continue to follow with nephrology. Labs checked today- we will send a copy to nephrology. Call with any concerns.

## 2017-06-03 NOTE — Assessment & Plan Note (Signed)
Rechecking levels today. Await results. Call with any concerns.  

## 2017-06-03 NOTE — Assessment & Plan Note (Signed)
Stable. Continue to follow with neurology. Call with any concerns. Continue to monitor.  

## 2017-06-03 NOTE — Assessment & Plan Note (Signed)
Mammogram ordered today. Await results.

## 2017-06-03 NOTE — Assessment & Plan Note (Signed)
Under good control. Continue current regimen. Continue to monitor. Call with any concerns. 

## 2017-06-03 NOTE — Progress Notes (Signed)
Subjective:   [name] is a 51 y.o. female who presents for Medicare Annual (Subsequent) preventive examination.  Review of Systems:  Cardiac Risk Factors include: advanced age (>28men, >17 women);diabetes mellitus;hypertension;obesity (BMI >30kg/m2)     Objective:     Vitals: BP 134/81 (BP Location: Left Arm, Patient Position: Sitting)   Pulse 79   Temp 98.1 F (36.7 C) (Oral)   Resp 16   Ht 5\' 6"  (1.676 m)   Wt 219 lb 6.4 oz (99.5 kg)   LMP 03/03/2016   BMI 35.41 kg/m   Body mass index is 35.41 kg/m.   Tobacco History  Smoking Status  . Former Smoker  . Quit date: 11/25/2008  Smokeless Tobacco  . Never Used     Counseling given: Not Answered   Past Medical History:  Diagnosis Date  . Arrhythmia   . Cerebrovascular accident [facility]) 2010   without any focal neurological deficits.   . Clotted renal dialysis AV graft ([place])   . 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 Ductal carcinoma in situ (DCIS) of left breast   . End stage renal disease ([place])    a. previously on dialysis on Tues, Thurs, & 11/16/22 x 7 yrs; b. s/p deceased donor transplant April 09, 2014   . GERD (gastroesophageal reflux disease)   . History of methicillin resistant Staphylococcus aureus infection   . History of stress test    a. 04/2013: normal, EF 65%  . HIT (heparin-induced thrombocytopenia) ([place])   . Hypertension   . Mitral regurgitation    a. 01/2014: EF >55%, LVH, mild MR, dilated LA  . Renal transplant, status post 04/09/14  . RLS (restless legs syndrome)   . Seizure disorder ([place])   . Seizures ([place])   . Sepsis ([place]) 08/09/2016  . 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 INSERTION OF DIALYSIS CATHETER     x 2  . KIDNEY TRANSPLANT  03/2014   right side.  Marland Kitchen MASTECTOMY PARTIAL / LUMPECTOMY Left 09/24/2015  . MASTECTOMY PARTIAL / LUMPECTOMY Left 10/25/2015   Family History  Problem Relation Age of Onset  .  Heart attack Mother   . Hypertension Mother   . Hyperlipidemia Mother   . Cancer Mother        breast  . Heart attack Father   . Hypertension Father   . Hyperlipidemia Father   . Kidney failure Sister        half sister  . Diabetes Maternal Grandmother   . Hearing loss Maternal Grandmother   . Heart failure Maternal Grandmother   . Diabetes Maternal Grandfather   . Stroke Maternal Grandfather   . Cancer Paternal Grandmother        breast  . Stroke Paternal Grandfather   . Aneurysm Brother        Brain  . Other Sister        Nerve problems   History  Sexual Activity  . Sexual activity: Not Currently    Outpatient Encounter Prescriptions as of 06/03/2017  Medication Sig  . aspirin EC 81 MG tablet Take 81 mg by mouth daily.  . carvedilol (COREG) 12.5 MG tablet Take 12.5 mg by mouth 2 (two) times daily with a meal.   . cetirizine (ZYRTEC) 10 MG tablet Take 1 tablet (10 mg total) by mouth daily.  . Cholecalciferol (VITAMIN D PO) Take by mouth daily.  Marland Kitchen docusate sodium (COLACE) 100 MG capsule Take 100  mg by mouth 2 (two) times daily as needed.  . fluticasone (FLONASE) 50 MCG/ACT nasal spray Two sprays each nare twice a day.  . gabapentin (NEURONTIN) 300 MG capsule Take 300-600 mg by mouth 3 (three) times daily. Take 1 capsule (300 mg) by mouth in the morning and at noon, 600 mg at bedtime.  . levETIRAcetam (KEPPRA) 500 MG tablet Take 500 mg by mouth 2 (two) times daily.   . mycophenolate (MYFORTIC) 180 MG EC tablet Take 360 mg by mouth 3 (three) times daily.   Marland Kitchen omeprazole (PRILOSEC) 20 MG capsule Take 20 mg by mouth at bedtime.   . sodium bicarbonate 650 MG tablet Take 2 tablets (1300 mg) three times a day.  . SSD 1 % cream   . tacrolimus (PROGRAF) 1 MG capsule Take 4 mg by mouth 2 (two) times daily.   . tamoxifen (NOLVADEX) 20 MG tablet Take 20 mg by mouth daily.  . valACYclovir (VALTREX) 1000 MG tablet Take 1 tablet (1,000 mg total) by mouth 2 (two) times daily.   No  facility-administered encounter medications on file as of 06/03/2017.     Activities of Daily Living In your present state of health, do you have any difficulty performing the following activities: 06/03/2017 08/09/2016  Hearing? N N  Vision? N N  Difficulty concentrating or making decisions? N N  Walking or climbing stairs? N N  Dressing or bathing? N N  Doing errands, shopping? N Y  Conservation officer, nature and eating ? N -  Using the Toilet? N -  In the past six months, have you accidently leaked urine? N -  Do you have problems with loss of bowel control? N -  Managing your Medications? N -  Managing your Finances? N -  Housekeeping or managing your Housekeeping? N -  Some recent data might be hidden    Patient Care Team: Valerie Roys, DO as PCP - General (Family Medicine) True, Isabella Bowens, MD as Referring Physician (Nephrology) Minna Merritts, MD as Consulting Physician (Cardiology)    Assessment:     Exercise Activities and Dietary recommendations Current Exercise Habits: The patient does not participate in regular exercise at present, Exercise limited by: None identified  Goals    None     Fall Risk Fall Risk  06/03/2017 03/10/2017 11/17/2016 08/26/2016 03/24/2016  Falls in the past year? No No No No No   Depression Screen PHQ 2/9 Scores 06/03/2017 03/10/2017 11/17/2016 08/26/2016  PHQ - 2 Score 3 0 0 0  PHQ- 9 Score 11 - - -     Cognitive Function        Immunization History  Administered Date(s) Administered  . Influenza,inj,Quad PF,6+ Mos 07/18/2014, 07/17/2015, 06/03/2017  . Influenza-Unspecified 06/17/2016  . Pneumococcal Conjugate-13 02/26/2017  . Tdap 03/14/2015   Screening Tests Health Maintenance  Topic Date Due  . OPHTHALMOLOGY EXAM  11/04/1976  . URINE MICROALBUMIN  11/04/1976  . COLONOSCOPY  11/04/2016  . HEMOGLOBIN A1C  08/29/2017  . FOOT EXAM  02/26/2018  . MAMMOGRAM  12/08/2018  . PAP SMEAR  05/19/2019  . PNEUMOCOCCAL POLYSACCHARIDE VACCINE (2)  02/26/2022  . TETANUS/TDAP  03/13/2025  . INFLUENZA VACCINE  Completed  . HIV Screening  Addressed      Plan:     I have personally reviewed and addressed the Medicare Annual Wellness questionnaire and have noted the following in the patient's chart:  A. Medical and social history B. Use of alcohol, tobacco or illicit drugs  C.  Current medications and supplements D. Functional ability and status E.  Nutritional status F.  Physical activity G. Advance directives H. List of other physicians I.  Hospitalizations, surgeries, and ER visits in previous 12 months J.  [place] such as hearing and vision if needed, cognitive and depression L. Referrals and appointments   In addition, I have reviewed and discussed with patient certain preventive protocols, quality metrics, and best practice recommendations. A written personalized care plan for preventive services as well as general preventive health recommendations were provided to patient.   Signed,  Tyler Aas, LPN Nurse Health Advisor   MD Recommendations: PHQ-9 score is 11.  cologuard ordered today.

## 2017-06-03 NOTE — Assessment & Plan Note (Addendum)
Going in the wring direction with A1c of 7.2. Will start 500mg  metformin daily and recheck 1 month. Continue to monitor. Call with any concerns.

## 2017-06-03 NOTE — Patient Instructions (Addendum)
[name] , Thank you for taking time to come for your Medicare Wellness Visit. I appreciate your ongoing commitment to your health goals. Please review the following plan we discussed and let me know if I can assist you in the future.   Screening recommendations/referrals: Colonoscopy: due, cologuard ordered today  Mammogram: up to date Bone Density: due  At age 50 Recommended yearly ophthalmology/optometry visit for glaucoma screening and checkup Recommended yearly dental visit for hygiene and checkup  Vaccinations: Influenza vaccine: done today Pneumococcal vaccine: up to date Tdap vaccine: up to date Shingles vaccine: due, check with your insurance company for coverage   Advanced directives: Advance directive discussed with you today. Even though you declined this today please call our office should you change your mind and we can give you the proper paperwork for you to fill out.  Conditions/risks identified: recommend drinking at least 5-6 glasses of water a day   Next appointment: Follow up in one year for your annual wellness exam.  Preventive Care 40-64 Years, Female Preventive care refers to lifestyle choices and visits with your health care provider that can promote health and wellness. What does preventive care include?  A yearly physical exam. This is also called an annual well check.  Dental exams once or twice a year.  Routine eye exams. Ask your health care provider how often you should have your eyes checked.  Personal lifestyle choices, including:  Daily care of your teeth and gums.  Regular physical activity.  Eating a healthy diet.  Avoiding tobacco and drug use.  Limiting alcohol use.  Practicing safe sex.  Taking low-dose aspirin daily starting at age 68.  Taking vitamin and mineral supplements as recommended by your health care provider. What happens during an annual well check? The services and screenings done by your health care provider  during your annual well check will depend on your age, overall health, lifestyle risk factors, and family history of disease. Counseling  Your health care provider may ask you questions about your:  Alcohol use.  Tobacco use.  Drug use.  Emotional well-being.  Home and relationship well-being.  Sexual activity.  Eating habits.  Work and work Statistician.  Method of birth control.  Menstrual cycle.  Pregnancy history. Screening  You may have the following tests or measurements:  Height, weight, and BMI.  Blood pressure.  Lipid and cholesterol levels. These may be checked every 5 years, or more frequently if you are over 47 years old.  Skin check.  Lung cancer screening. You may have this screening every year starting at age 37 if you have a 30-pack-year history of smoking and currently smoke or have quit within the past 15 years.  Fecal occult blood test (FOBT) of the stool. You may have this test every year starting at age 64.  Flexible sigmoidoscopy or colonoscopy. You may have a sigmoidoscopy every 5 years or a colonoscopy every 10 years starting at age 71.  Hepatitis C blood test.  Hepatitis B blood test.  Sexually transmitted disease (STD) testing.  Diabetes screening. This is done by checking your blood sugar (glucose) after you have not eaten for a while (fasting). You may have this done every 1-3 years.  Mammogram. This may be done every 1-2 years. Talk to your health care provider about when you should start having regular mammograms. This may depend on whether you have a family history of breast cancer.  BRCA-related cancer screening. This may be done if you have a  family history of breast, ovarian, tubal, or peritoneal cancers.  Pelvic exam and Pap test. This may be done every 3 years starting at age 16. Starting at age 58, this may be done every 5 years if you have a Pap test in combination with an HPV test.  Bone density scan. This is done to screen  for osteoporosis. You may have this scan if you are at high risk for osteoporosis. Discuss your test results, treatment options, and if necessary, the need for more tests with your health care provider. Vaccines  Your health care provider may recommend certain vaccines, such as:  Influenza vaccine. This is recommended every year.  Tetanus, diphtheria, and acellular pertussis (Tdap, Td) vaccine. You may need a Td booster every 10 years.  Zoster vaccine. You may need this after age 41.  Pneumococcal 13-valent conjugate (PCV13) vaccine. You may need this if you have certain conditions and were not previously vaccinated.  Pneumococcal polysaccharide (PPSV23) vaccine. You may need one or two doses if you smoke cigarettes or if you have certain conditions. Talk to your health care provider about which screenings and vaccines you need and how often you need them. This information is not intended to replace advice given to you by your health care provider. Make sure you discuss any questions you have with your health care provider. Document Released: 08/16/2015 Document Revised: 04/08/2016 Document Reviewed: 05/21/2015 Elsevier Interactive Patient Education  2017 [place] Prevention in the Home Falls can cause injuries. They can happen to people of all ages. There are many things you can do to make your home safe and to help prevent falls. What can I do on the outside of my home?  Regularly fix the edges of walkways and driveways and fix any cracks.  Remove anything that might make you trip as you walk through a door, such as a raised step or threshold.  Trim any bushes or trees on the path to your home.  Use bright outdoor lighting.  Clear any walking paths of anything that might make someone trip, such as rocks or tools.  Regularly check to see if handrails are loose or broken. Make sure that both sides of any steps have handrails.  Any raised decks and porches should  have guardrails on the edges.  Have any leaves, snow, or ice cleared regularly.  Use sand or salt on walking paths during winter.  Clean up any spills in your garage right away. This includes oil or grease spills. What can I do in the bathroom?  Use night lights.  Install grab bars by the toilet and in the tub and shower. Do not use towel bars as grab bars.  Use non-skid mats or decals in the tub or shower.  If you need to sit down in the shower, use a plastic, non-slip stool.  Keep the floor dry. Clean up any water that spills on the floor as soon as it happens.  Remove soap buildup in the tub or shower regularly.  Attach bath mats securely with double-sided non-slip rug tape.  Do not have throw rugs and other things on the floor that can make you trip. What can I do in the bedroom?  Use night lights.  Make sure that you have a light by your bed that is easy to reach.  Do not use any sheets or blankets that are too big for your bed. They should not hang down onto the floor.  Have a firm  chair that has side arms. You can use this for support while you get dressed.  Do not have throw rugs and other things on the floor that can make you trip. What can I do in the kitchen?  Clean up any spills right away.  Avoid walking on wet floors.  Keep items that you use a lot in easy-to-reach places.  If you need to reach something above you, use a strong step stool that has a grab bar.  Keep electrical cords out of the way.  Do not use floor polish or wax that makes floors slippery. If you must use wax, use non-skid floor wax.  Do not have throw rugs and other things on the floor that can make you trip. What can I do with my stairs?  Do not leave any items on the stairs.  Make sure that there are handrails on both sides of the stairs and use them. Fix handrails that are broken or loose. Make sure that handrails are as long as the stairways.  Check any carpeting to make sure  that it is firmly attached to the stairs. Fix any carpet that is loose or worn.  Avoid having throw rugs at the top or bottom of the stairs. If you do have throw rugs, attach them to the floor with carpet tape.  Make sure that you have a light switch at the top of the stairs and the bottom of the stairs. If you do not have them, ask someone to add them for you. What else can I do to help prevent falls?  Wear shoes that:  Do not have high heels.  Have rubber bottoms.  Are comfortable and fit you well.  Are closed at the toe. Do not wear sandals.  If you use a stepladder:  Make sure that it is fully opened. Do not climb a closed stepladder.  Make sure that both sides of the stepladder are locked into place.  Ask someone to hold it for you, if possible.  Clearly mark and make sure that you can see:  Any grab bars or handrails.  First and last steps.  Where the edge of each step is.  Use tools that help you move around (mobility aids) if they are needed. These include:  Canes.  Walkers.  Scooters.  Crutches.  Turn on the lights when you go into a dark area. Replace any light bulbs as soon as they burn out.  Set up your furniture so you have a clear path. Avoid moving your furniture around.  If any of your floors are uneven, fix them.  If there are any pets around you, be aware of where they are.  Review your medicines with your doctor. Some medicines can make you feel dizzy. This can increase your chance of falling. Ask your doctor what other things that you can do to help prevent falls. This information is not intended to replace advice given to you by your health care provider. Make sure you discuss any questions you have with your health care provider. Document Released: 05/16/2009 Document Revised: 12/26/2015 Document Reviewed: 08/24/2014 Elsevier Interactive Patient Education  2017 [employer].

## 2017-06-03 NOTE — Progress Notes (Signed)
BP 134/81 (BP Location: Left Arm, Patient Position: Sitting, Cuff Size: Large)   Pulse 79   Temp 98.1 F (36.7 C)   Ht 5\' 6"  (1.676 m)   Wt 219 lb 4 oz (99.5 kg)   LMP 03/03/2016   BMI 35.39 kg/m    Subjective:    Patient ID: [name], female    DOB: [birth year], 51 y.o.   MRN: [id number]  HPI: [name] is a 51 y.o. female presenting on 06/03/2017 for comprehensive medical examination. Current medical complaints include:  DIABETES Hypoglycemic episodes:no Polydipsia/polyuria: no Visual disturbance: no Chest pain: no Paresthesias: no Glucose Monitoring: no Taking Insulin?: no Blood Pressure Monitoring: not checking Retinal Examination: Referral generated today Foot Exam: Up to Date Diabetic Education: Completed Pneumovax: Up to Date Influenza: Up to Date Aspirin: yes  HYPERTENSION Hypertension status: controlled  Satisfied with current treatment? yes Duration of hypertension: chronic BP monitoring frequency:  not checking BP medication side effects:  no Medication compliance: good compliance Previous BP meds: carvedilol Aspirin: yes Recurrent headaches: no Visual changes: no Palpitations: no Dyspnea: no Chest pain: no Lower extremity edema: no Dizzy/lightheaded: no  Grandmother passed away on 2017/05/30- has been really tired, has been mourning.   She currently lives with: family Menopausal Symptoms: no  Depression Screen done today and results listed below:  Depression screen [facility] 2/9 06/03/2017 03/10/2017 11/17/2016 08/26/2016 March 31, 2016  Decreased Interest 3 0 0 0 0  Down, Depressed, Hopeless 0 0 0 0 0  PHQ - 2 Score 3 0 0 0 0  Altered sleeping 1 - - - -  Tired, decreased energy 0 - - - -  Change in appetite 1 - - - -  Feeling bad or failure about yourself  0 - - - -  Trouble concentrating 3 - - - -  Moving slowly or fidgety/restless 3 - - - -  Suicidal thoughts 0 - - - -  PHQ-9 Score 11 - - - -  Difficult doing work/chores Somewhat  difficult - - - -   Past Medical History:  Past Medical History:  Diagnosis Date  . Arrhythmia   . Cerebrovascular accident [facility]) 2010   without any focal neurological deficits.   . Clotted renal dialysis AV graft ([place])   . 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 Ductal carcinoma in situ (DCIS) of left breast   . End stage renal disease ([place])    a. previously on dialysis on Tues, Thurs, & November 07, 2022 x 7 yrs; b. s/p deceased donor transplant 03/31/2014   . GERD (gastroesophageal reflux disease)   . History of methicillin resistant Staphylococcus aureus infection   . History of stress test    a. 04/2013: normal, EF 65%  . HIT (heparin-induced thrombocytopenia) ([place])   . Hypertension   . Mitral regurgitation    a. 01/2014: EF >55%, LVH, mild MR, dilated LA  . Renal transplant, status post 31-Mar-2014  . RLS (restless legs syndrome)   . Seizure disorder ([place])   . Seizures ([place])   . Sepsis ([place]) 08/09/2016  . Stroke due to intracerebral hemorrhage ([place]) 11/17/2013    Surgical History:  Past Surgical History:  Procedure Laterality Date  . INSERTION OF DIALYSIS CATHETER     x 2  . KIDNEY TRANSPLANT  03/2014   right side.  Marland Kitchen MASTECTOMY PARTIAL / LUMPECTOMY Left 09/24/2015  . MASTECTOMY PARTIAL / LUMPECTOMY Left 10/25/2015    Medications:  Current Outpatient Prescriptions on File Prior to Visit  Medication  Sig  . aspirin EC 81 MG tablet Take 81 mg by mouth daily.  . carvedilol (COREG) 12.5 MG tablet Take 12.5 mg by mouth 2 (two) times daily with a meal.   . cetirizine (ZYRTEC) 10 MG tablet Take 1 tablet (10 mg total) by mouth daily.  . Cholecalciferol (VITAMIN D PO) Take by mouth daily.  Marland Kitchen docusate sodium (COLACE) 100 MG capsule Take 100 mg by mouth 2 (two) times daily as needed.  . fluticasone (FLONASE) 50 MCG/ACT nasal spray Two sprays each nare twice a day.  . gabapentin (NEURONTIN) 300 MG capsule Take 300-600 mg by mouth 3 (three) times daily. Take 1 capsule (300 mg) by mouth in the  morning and at noon, 600 mg at bedtime.  . levETIRAcetam (KEPPRA) 500 MG tablet Take 500 mg by mouth 2 (two) times daily.   . mycophenolate (MYFORTIC) 180 MG EC tablet Take 360 mg by mouth 3 (three) times daily.   Marland Kitchen omeprazole (PRILOSEC) 20 MG capsule Take 20 mg by mouth at bedtime.   . sodium bicarbonate 650 MG tablet Take 2 tablets (1300 mg) three times a day.  . SSD 1 % cream   . tacrolimus (PROGRAF) 1 MG capsule Take 4 mg by mouth 2 (two) times daily.   . tamoxifen (NOLVADEX) 20 MG tablet Take 20 mg by mouth daily.   No current facility-administered medications on file prior to visit.     Allergies:  Allergies  Allergen Reactions  . Heparin     "i bled for a long time and had to have 2 blood transfusions"  . Ibuprofen     Caused kidney damage  . Penicillins     Childhood reaction    Social History:  Social History   Social History  . Marital status: Single    Spouse name: N/A  . Number of children: N/A  . Years of education: N/A   Occupational History  . Not on file.   Social History Main Topics  . Smoking status: Former Smoker    Quit date: 11/25/2008  . Smokeless tobacco: Never Used  . Alcohol use Yes     Comment: on occasion  . Drug use: No     Comment: quit in 2003  . Sexual activity: Not Currently   Other Topics Concern  . Not on file   Social History Narrative  . No narrative on file   History  Smoking Status  . Former Smoker  . Quit date: 11/25/2008  Smokeless Tobacco  . Never Used   History  Alcohol Use  . Yes    Comment: on occasion    Family History:  Family History  Problem Relation Age of Onset  . Heart attack Mother   . Hypertension Mother   . Hyperlipidemia Mother   . Cancer Mother        breast  . Heart attack Father   . Hypertension Father   . Hyperlipidemia Father   . Kidney failure Sister        half sister  . Diabetes Maternal Grandmother   . Hearing loss Maternal Grandmother   . Heart failure Maternal Grandmother     . Diabetes Maternal Grandfather   . Stroke Maternal Grandfather   . Cancer Paternal Grandmother        breast  . Stroke Paternal Grandfather   . Aneurysm Brother        Brain  . Other Sister        Nerve problems  Past medical history, surgical history, medications, allergies, family history and social history reviewed with patient today and changes made to appropriate areas of the chart.   Review of Systems  Constitutional: Negative.   HENT: Positive for congestion. Negative for ear discharge, ear pain, hearing loss, nosebleeds, sinus pain, sore throat and tinnitus.   Eyes: Negative.   Respiratory: Negative.  Negative for stridor.   Cardiovascular: Negative.   Gastrointestinal: Positive for heartburn. Negative for abdominal pain, blood in stool, constipation, diarrhea, melena, nausea and vomiting.  Genitourinary: Negative.   Musculoskeletal: Positive for joint pain and myalgias. Negative for back pain, falls and neck pain.  Skin: Negative.   Neurological: Positive for headaches. Negative for dizziness, tingling, tremors, sensory change, speech change, focal weakness, seizures and loss of consciousness.  Endo/Heme/Allergies: Negative.   Psychiatric/Behavioral: Positive for depression. Negative for hallucinations, memory loss, substance abuse and suicidal ideas. The patient is not nervous/anxious and does not have insomnia.     All other ROS negative except what is listed above and in the HPI.      Objective:    BP 134/81 (BP Location: Left Arm, Patient Position: Sitting, Cuff Size: Large)   Pulse 79   Temp 98.1 F (36.7 C)   Ht 5\' 6"  (1.676 m)   Wt 219 lb 4 oz (99.5 kg)   LMP 03/03/2016   BMI 35.39 kg/m   Wt Readings from Last 3 Encounters:  06/03/17 219 lb 4 oz (99.5 kg)  06/03/17 219 lb 6.4 oz (99.5 kg)  04/08/17 214 lb (97.1 kg)    Physical Exam  Constitutional: She is oriented to person, place, and time. She appears well-developed and well-nourished. No  distress.  HENT:  Head: Normocephalic and atraumatic.  Right Ear: Hearing, tympanic membrane, external ear and ear canal normal.  Left Ear: Hearing, tympanic membrane, external ear and ear canal normal.  Nose: Nose normal.  Mouth/Throat: Uvula is midline, oropharynx is clear and moist and mucous membranes are normal. No oropharyngeal exudate.  Eyes: Pupils are equal, round, and reactive to light. Conjunctivae, EOM and lids are normal. Right eye exhibits no discharge. Left eye exhibits no discharge. No scleral icterus.  Neck: Normal range of motion. Neck supple. No JVD present. No tracheal deviation present. No thyromegaly present.  Cardiovascular: Normal rate, regular rhythm, normal heart sounds and intact distal pulses.  Exam reveals no gallop and no friction rub.   No murmur heard. Pulmonary/Chest: Effort normal and breath sounds normal. No stridor. No respiratory distress. She has no wheezes. She has no rales. She exhibits no tenderness.  Abdominal: Soft. Bowel sounds are normal. She exhibits no distension and no mass. There is no tenderness. There is no rebound and no guarding.  Genitourinary:  Genitourinary Comments: Breast and pelvic deferred- done at GYN  Musculoskeletal: Normal range of motion. She exhibits no edema, tenderness or deformity.  Lymphadenopathy:    She has no cervical adenopathy.  Neurological: She is alert and oriented to person, place, and time. She has normal reflexes. She displays normal reflexes. No cranial nerve deficit. She exhibits normal muscle tone. Coordination normal.  Skin: Skin is warm, dry and intact. No rash noted. She is not diaphoretic. No erythema. No pallor.  Psychiatric: She has a normal mood and affect. Her speech is normal and behavior is normal. Judgment and thought content normal. Cognition and memory are normal.  Nursing note and vitals reviewed.   Results for orders placed or performed in visit on 02/26/17  Bayer [place] Hb  A1c Waived  Result  Value Ref Range   Bayer DCA Hb A1c Waived 6.9 <7.0 %      Assessment & Plan:   Problem List Items Addressed This Visit      Cardiovascular and Mediastinum   SVT (supraventricular tachycardia) (HCC)    HR stable today. Continue current regimen. Continue to monitor. Call with any concerns.         Endocrine   Diabetes mellitus type 2, diet-controlled ([place])    Going in the wring direction with A1c of 7.2. Will start 500mg  metformin daily and recheck 1 month. Continue to monitor. Call with any concerns.       Relevant Medications   metFORMIN (GLUCOPHAGE-XR) 500 MG 24 hr tablet   Other Relevant Orders   CBC with Differential/Platelet   Bayer DCA Hb A1c Waived   Comprehensive metabolic panel   Lipid Panel w/o Chol/HDL Ratio   Microalbumin, Urine Waived   TSH   UA/M w/rflx Culture, Routine   Ambulatory referral to Ophthalmology     Nervous and Auditory   Seizure disorder (HCC)    Stable. Continue to follow with neurology. Call with any concerns. Continue to monitor.       Relevant Orders   CBC with Differential/Platelet   Comprehensive metabolic panel   Microalbumin, Urine Waived   TSH   UA/M w/rflx Culture, Routine     Genitourinary   Benign hypertensive renal disease    Under good control. Continue current regimen. Continue to monitor. Call with any concerns.       Relevant Orders   CBC with Differential/Platelet   Comprehensive metabolic panel   Microalbumin, Urine Waived   TSH   UA/M w/rflx Culture, Routine   FSGS (focal segmental glomerulosclerosis)    Continue to follow with nephrology. Labs checked today- we will send a copy to nephrology. Call with any concerns.       CKD (chronic kidney disease) stage 3, GFR 30-59 ml/min (HCC)    Continue to follow with nephrology. Labs checked today- we will send a copy to nephrology. Call with any concerns.       Relevant Orders   CBC with Differential/Platelet   Comprehensive metabolic panel   Microalbumin, Urine  Waived   TSH   UA/M w/rflx Culture, Routine     Other   Renal transplant, status post    Continue to follow with nephrology. Labs checked today- we will send a copy to nephrology. Call with any concerns.       Relevant Orders   CBC with Differential/Platelet   Comprehensive metabolic panel   Microalbumin, Urine Waived   TSH   UA/M w/rflx Culture, Routine   Ductal carcinoma in situ (DCIS) of left breast    Mammogram ordered today. Await results.       Relevant Medications   valACYclovir (VALTREX) 1000 MG tablet   Vitamin D deficiency    Rechecking levels today. Await results. Call with any concerns.       Relevant Orders   CBC with Differential/Platelet   Comprehensive metabolic panel   Microalbumin, Urine Waived   TSH   UA/M w/rflx Culture, Routine   VITAMIN D 25 Hydroxy (Vit-D Deficiency, Fractures)    Other Visit Diagnoses    Routine general medical examination at a health care facility    -  Primary   Vaccines updated. Screening labs checked today. Colonoscopy ordered. Mammogram ordered. Pap up to date. Call with any concerns.    Positive depression screening  Just lost her grandmother. Will give her time to grieve and recheck next month.        Follow up plan: Return in about 4 weeks (around 07/01/2017) for Follow up mood and diabetes.   LABORATORY TESTING:  - Pap smear: up to date  IMMUNIZATIONS:   - Tdap: Tetanus vaccination status reviewed: last tetanus booster within 10 years. - Influenza: Administered today - Pneumovax: up to date - Prevnar: Up to date - Zostavax vaccine: Not applicable  SCREENING: -Mammogram: up to date  - Colonoscopy: Cologuard Ordered today   PATIENT COUNSELING:   Advised to take 1 mg of folate supplement per day if capable of pregnancy.   Sexuality: Discussed sexually transmitted diseases, partner selection, use of condoms, avoidance of unintended pregnancy  and contraceptive alternatives.   Advised to avoid cigarette  smoking.  I discussed with the patient that most people either abstain from alcohol or drink within safe limits (<=14/week and <=4 drinks/occasion for males, <=7/weeks and <= 3 drinks/occasion for females) and that the risk for alcohol disorders and other health effects rises proportionally with the number of drinks per week and how often a drinker exceeds daily limits.  Discussed cessation/primary prevention of drug use and availability of treatment for abuse.   Diet: Encouraged to adjust caloric intake to maintain  or achieve ideal body weight, to reduce intake of dietary saturated fat and total fat, to limit sodium intake by avoiding high sodium foods and not adding table salt, and to maintain adequate dietary potassium and calcium preferably from fresh fruits, vegetables, and low-fat dairy products.    stressed the importance of regular exercise  Injury prevention: Discussed safety belts, safety helmets, smoke detector, smoking near bedding or upholstery.   Dental health: Discussed importance of regular tooth brushing, flossing, and dental visits.    NEXT PREVENTATIVE PHYSICAL DUE IN 1 YEAR. Return in about 4 weeks (around 07/01/2017) for Follow up mood and diabetes.

## 2017-06-03 NOTE — Assessment & Plan Note (Signed)
HR stable today. Continue current regimen. Continue to monitor. Call with any concerns.

## 2017-06-04 ENCOUNTER — Telehealth: Payer: Self-pay | Admitting: Family Medicine

## 2017-06-04 ENCOUNTER — Other Ambulatory Visit: Payer: Self-pay | Admitting: Family Medicine

## 2017-06-04 ENCOUNTER — Encounter: Payer: Self-pay | Admitting: Family Medicine

## 2017-06-04 LAB — LIPID PANEL W/O CHOL/HDL RATIO
Cholesterol, Total: 201 mg/dL — ABNORMAL HIGH (ref 100–199)
HDL: 28 mg/dL — AB (ref >39)
Triglycerides: 401 mg/dL — ABNORMAL HIGH (ref 0–149)

## 2017-06-04 LAB — CBC WITH DIFFERENTIAL/PLATELET
Basophils Absolute: 0 10*3/uL (ref 0.0–0.2)
Basos: 0 %
EOS (ABSOLUTE): 0.2 10*3/uL (ref 0.0–0.4)
Eos: 2 %
Hematocrit: 36.8 % (ref 34.0–46.6)
Hemoglobin: 11.5 g/dL (ref 11.1–15.9)
Immature Grans (Abs): 0 10*3/uL (ref 0.0–0.1)
Immature Granulocytes: 0 %
LYMPHS ABS: 1.7 10*3/uL (ref 0.7–3.1)
Lymphs: 22 %
MCH: 29.1 pg (ref 26.6–33.0)
MCHC: 31.3 g/dL — AB (ref 31.5–35.7)
MCV: 93 fL (ref 79–97)
Monocytes Absolute: 0.5 10*3/uL (ref 0.1–0.9)
Monocytes: 7 %
NEUTROS ABS: 5.2 10*3/uL (ref 1.4–7.0)
Neutrophils: 69 %
PLATELETS: 246 10*3/uL (ref 150–379)
RBC: 3.95 x10E6/uL (ref 3.77–5.28)
RDW: 13.6 % (ref 12.3–15.4)
WBC: 7.6 10*3/uL (ref 3.4–10.8)

## 2017-06-04 LAB — COMPREHENSIVE METABOLIC PANEL
A/G RATIO: 1.5 (ref 1.2–2.2)
ALBUMIN: 4.3 g/dL (ref 3.5–5.5)
ALT: 10 IU/L (ref 0–32)
AST: 14 IU/L (ref 0–40)
Alkaline Phosphatase: 43 IU/L (ref 39–117)
BILIRUBIN TOTAL: 0.2 mg/dL (ref 0.0–1.2)
BUN/Creatinine Ratio: 14 (ref 9–23)
BUN: 26 mg/dL — ABNORMAL HIGH (ref 6–24)
CO2: 19 mmol/L — ABNORMAL LOW (ref 20–29)
Calcium: 10.1 mg/dL (ref 8.7–10.2)
Chloride: 105 mmol/L (ref 96–106)
Creatinine, Ser: 1.82 mg/dL — ABNORMAL HIGH (ref 0.57–1.00)
GFR, EST AFRICAN AMERICAN: 37 mL/min/{1.73_m2} — AB (ref >59)
GFR, EST NON AFRICAN AMERICAN: 32 mL/min/{1.73_m2} — AB (ref >59)
GLOBULIN, TOTAL: 2.8 g/dL (ref 1.5–4.5)
Glucose: 125 mg/dL — ABNORMAL HIGH (ref 65–99)
POTASSIUM: 4.7 mmol/L (ref 3.5–5.2)
SODIUM: 138 mmol/L (ref 134–144)
TOTAL PROTEIN: 7.1 g/dL (ref 6.0–8.5)

## 2017-06-04 LAB — VITAMIN D 25 HYDROXY (VIT D DEFICIENCY, FRACTURES): VIT D 25 HYDROXY: 33.3 ng/mL (ref 30.0–100.0)

## 2017-06-04 LAB — BAYER DCA HB A1C WAIVED: HB A1C (BAYER DCA - WAIVED): 7.2 % — ABNORMAL HIGH (ref <7.0)

## 2017-06-04 LAB — TSH: TSH: 0.989 u[IU]/mL (ref 0.450–4.500)

## 2017-06-04 NOTE — Telephone Encounter (Signed)
Copied from [place] [id number]. Topic: Inquiry >> Jun 04, 2017 11:47 AM Neva Seat wrote: [name] w/ [facility] [phone]  Stated pt has had 3 eye visits - PT was a NO SHOW for all 3 appts. [place] wants to know the appt Dec. 10, 18 @ 2:30pm if she will show.  If she doesn't show she can no longer be seen at [facility]. [place] was wondering if someone can call pt to encourage her to come to this latest appt.

## 2017-06-04 NOTE — Telephone Encounter (Signed)
Patient aware of referral

## 2017-06-07 NOTE — Unmapped (Signed)
Pt. Called on call nurse as her primary nurse out of office. She wants to have appt in dental clinic but they told her a new referral needed because her last appt there > 1 year ago. Will ask [facility] to send in referral.

## 2017-06-08 LAB — CBC W/ DIFFERENTIAL
BASOPHILS ABSOLUTE COUNT: 0 10*9/L
BASOPHILS RELATIVE PERCENT: 0 %
EOSINOPHILS ABSOLUTE COUNT: 0.2 10*9/L
EOSINOPHILS RELATIVE PERCENT: 2 %
HEMATOCRIT: 36.8 %
HEMOGLOBIN: 11.5 g/dL
LYMPHOCYTES ABSOLUTE COUNT: 1.7 10*9/L
LYMPHOCYTES RELATIVE PERCENT: 22 %
MEAN CORPUSCULAR HEMOGLOBIN: 29.1 pg
MEAN CORPUSCULAR VOLUME: 93 fL
MONOCYTES ABSOLUTE COUNT: 0.5 10*9/L
NEUTROPHILS ABSOLUTE COUNT: 5.2 10*9/L
NEUTROPHILS RELATIVE PERCENT: 69 %
PLATELET COUNT: 246 10*9/L
RED CELL DISTRIBUTION WIDTH: 13.6 %
WHITE BLOOD CELL COUNT: 7.6 10*9/L

## 2017-06-08 LAB — COMPREHENSIVE METABOLIC PANEL
ALKALINE PHOSPHATASE: 43 U/L
ALT (SGPT): 10 U/L
AST (SGOT): 14 U/L
BILIRUBIN TOTAL: 0.2 mg/dL
CALCIUM: 10.1 mg/dL
CHLORIDE: 105 mmol/L
CO2: 19 mmol/L — ABNORMAL LOW
CREATININE: 1.82 mg/dL — ABNORMAL HIGH
EGFR MDRD AF AMER: 37 mL/min/{1.73_m2} — ABNORMAL LOW
POTASSIUM: 4.7 mmol/L
PROTEIN TOTAL: 7.1 g/dL
SODIUM: 138 mmol/L

## 2017-06-08 LAB — HDL CHOLESTEROL: Lab: 28 — ABNORMAL LOW

## 2017-06-08 LAB — LIPID PANEL
CHOLESTEROL: 201 mg/dL — ABNORMAL HIGH
HDL CHOLESTEROL: 28 mg/dL — ABNORMAL LOW
LDL CHOLESTEROL CALCULATED: 103 mg/dL — ABNORMAL HIGH

## 2017-06-08 LAB — VITAMIN D 25 HYDROXY

## 2017-06-08 LAB — ALBUMIN: Lab: 1.5

## 2017-06-08 LAB — THYROID STIMULATING HORMONE: Lab: 0.989

## 2017-06-08 LAB — POTASSIUM: Lab: 4.7

## 2017-06-08 LAB — LYMPHOCYTES RELATIVE PERCENT: Lab: 22

## 2017-06-08 LAB — VIT D3 (25OH): Lab: 0

## 2017-06-09 LAB — UA/M W/RFLX CULTURE, ROUTINE
Bilirubin, UA: NEGATIVE
Glucose, UA: NEGATIVE
Ketones, UA: NEGATIVE
NITRITE UA: NEGATIVE
PH UA: 6 (ref 5.0–7.5)
Specific Gravity, UA: 1.015 (ref 1.005–1.030)
Urobilinogen, Ur: 0.2 mg/dL (ref 0.2–1.0)

## 2017-06-09 LAB — URINE CULTURE, REFLEX

## 2017-06-09 LAB — MICROSCOPIC EXAMINATION

## 2017-06-09 LAB — MICROALBUMIN, URINE WAIVED
CREATININE, URINE WAIVED: 100 mg/dL (ref 10–300)
MICROALB, UR WAIVED: 80 mg/L — AB (ref 0–19)

## 2017-06-17 DIAGNOSIS — Z1211 Encounter for screening for malignant neoplasm of colon: Secondary | ICD-10-CM | POA: Diagnosis not present

## 2017-06-17 DIAGNOSIS — Z1212 Encounter for screening for malignant neoplasm of rectum: Secondary | ICD-10-CM | POA: Diagnosis not present

## 2017-06-17 LAB — COLOGUARD: COLOGUARD: NEGATIVE

## 2017-06-18 NOTE — Unmapped (Signed)
[facility] Refill Coordination Note  Specialty Medication(s): Prograf 1mg  & Myfortic 180mg     Nancy Bowman, DOB: 08/31/1965  Phone: [phone] (home) , Alternate phone contact: N/A  Phone or address changes today?: No  All above HIPAA information was verified with patient.  Shipping Address: [address]  [place] [address]   Insurance changes? No    Completed refill call assessment today to schedule patient's medication shipment from the [facility] [phone]).      Confirmed the medication and dosage are correct and have not changed: Yes, regimen is correct and unchanged.    Confirmed patient started or stopped the following medications in the past month:  No, there are no changes reported at this time.    Are you tolerating your medication?:  Nancy Bowman reports tolerating the medication.    ADHERENCE    (Below is required for Medicare Part B or Transplant patients only - per drug):   How many tablets were dispensed last month:   Prograf 1 mg   Quantity filled last month: 240   # of tablets left on hand: 6 days  Myfortic 180 mg   Quantity filled last month: 180   # of tablets left on hand: 6 days    Did you miss any doses in the past 4 weeks? No missed doses reported.    FINANCIAL/SHIPPING    Delivery Scheduled: Yes, Expected medication delivery date: 06/23/2017     Nancy Bowman did not have any additional questions at this time.    Delivery address validated in [facility] scheduling system: Yes, address listed in [facility] is correct.    We will follow up with patient monthly for standard refill processing and delivery.      Thank you,  Tamala Fothergill   [facility] Specialty Technician

## 2017-06-21 MED FILL — PROGRAF/1MG/CAP: PROGRAF/1MG/CAP | 30 days supply | Qty: 240 | Fill #8

## 2017-06-21 MED FILL — MYFORTIC/180MG/TAB: MYFORTIC/180MG/TAB | 30 days supply | Qty: 180 | Fill #8

## 2017-06-22 NOTE — Unmapped (Deleted)
{** REMINDER - THIS NOTE IS NOT A FINAL MEDICAL RECORD UNTIL IT IS SIGNED.?? UNTIL THEN, THE CONTENT BELOW MAY REFLECT INFORMATION FROM A DOCUMENTATION TEMPLATE, NOT THE ACTUAL PATIENT VISIT. **}  Transplant Nephrology Clinic Visit    History of Present Illness    Patient is a 51 y.o. 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  She was on dialysis approximately 7 years prior to this transplant, the donor kidney had a KDPI of 65%.  She did have issues with hyperkalemia while on dialysis, and was on Kayexalate weekly.  Her post transplant course has been without rejection or recurrent disease. Patient does have evidence of a donor specific antibody to DP01 at an unknown MFI, was 2681 on 10/24/2014, MFI not defined on 03/27/2015. Her most recent creatinine has ranged between 1.6 and 2.4.    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     In December 2016, she underwent routine mammogram that was abnormal. Follow-up mammogram in January was again highly suggestive of significant lesion in the left breast. She underwent core biopsy on 09/09/2015 which showed ductal carcinoma in situ. She subsequently underwent a partial mastectomy on 09/24/2015. 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 That final pathology showed a positive medial margin, so she underwent reexcision on 10/25/2015 for which the pathology was negative. Her surgical management was felt to be complete.     Interval history since last seen on 11/04/2016    Patient seen by surgical oncology for routine cancer screening given history of left breast DCIS. Her imaging studies were stable and continues on tamoxifen with change or adverse effect.     The patient was hospitalized in [place] in the beginning of January for 2-3 days with sinus congestion, shortness of breath on exertion, and scant hemoptysis. She was diagnosed with URI and treated with Levaquin. Her symptoms have improved.    Patient presents today for follow up. Overall, she feels very well with no complaints. She has finished her cancer treatment and is on tamoxifen and is tolerating that well. She has some seasonal allergy symptoms and is taking Flonase which does not completely alleviate her symptoms.     Today is her 51th birthday.     Review of Systems    Otherwise on review of systems patient denies fever or chills, chest pain, SOB, PND or orthopnea, lower extremity edema.  Denies abdominal pain.  No dysuria, hematuria or difficulty voiding. Bowel movements normal.  Denies joint pain or rash.  All other systems are reviewed and are negative.    Medications    Current Outpatient Prescriptions   Medication Sig Dispense Refill   ??? acetaminophen (TYLENOL) 500 MG tablet Take 1,000 mg by mouth every four (4) hours as needed.      ??? amoxicillin (AMOXIL) 500 MG capsule Take one capsule (500 mg) twice a day by mouth for 7 days. 14 capsule 0   ??? aspirin 81 MG chewable tablet Chew 1 tablet (81 mg total) daily. 30 tablet 11   ??? carvedilol (COREG) 12.5 MG tablet Take 1 tablet (12.5 mg total) by mouth Two (2) times a day. 60 tablet 11   ??? cetirizine (ZYRTEC) 10 MG tablet Take 10 mg by mouth daily.      ??? docusate sodium (COLACE) 100 MG capsule Take one capsule twice a day as needed 60 capsule  1   ??? doxycycline (VIBRAMYCIN) 100 MG capsule Take 100 mg by mouth Two (2) times a day.      ??? ergocalciferol (VITAMIN D2) 50,000 unit capsule Take 50,000 Units by mouth once a week.     ??? fluticasone (FLONASE) 50 mcg/actuation nasal spray Two sprays each nare twice a day. 16 g 5   ??? gabapentin (NEURONTIN) 300 MG capsule Take 1 capsules (300 mg) by mouth in the morning and at noon, 600 mg in the evening. 120 capsule 11   ??? HYDROcodone-acetaminophen (NORCO) 5-325 mg per tablet Take 1 tablet by mouth every four (4) hours as needed.      ??? levETIRAcetam (KEPPRA) 500 MG tablet Take 1 tablet (500 mg total) by mouth Two (2) times a day. 60 tablet 11   ??? miscellaneous medical supply Misc 1 knee brace 1 each 0   ??? MYFORTIC 180 mg EC tablet Take 2 tablets (360 mg total) by mouth Three (3) times a day. 180 tablet 11   ??? omeprazole (PRILOSEC) 20 MG capsule Take 1 capsule (20 mg total) by mouth Two (2) times a day. (Patient taking differently: Take 20 mg by mouth two (2) times a day as needed. ) 60 capsule 5   ??? PROGRAF 1 mg capsule Take  4mg  in the am and 4 mg in the pm. Z94.0 kidney transplant, tx date 03/24/14, DAW1 240 capsule 11   ??? sodium bicarbonate 650 mg tablet Take 2 tablets (1300 mg) three times a day. 120 tablet 11   ??? sulfamethoxazole-trimethoprim (BACTRIM DS) 800-160 mg per tablet Take 1 tablet by mouth once.      ??? tamoxifen (NOLVADEX) 20 MG tablet Take 1 tablet (20 mg total) by mouth daily. 90 tablet 4     No current facility-administered medications for this visit.      Facility-Administered Medications Ordered in Other Visits   Medication Dose Route Frequency Provider Last Rate Last Dose   ??? lidocaine-EPINEPHrine (XYLOCAINE W/EPI) 1 %-1:100,000 injection 12 mL  12 mL Intradermal Once Talbert Cage, DO           Physical Exam  There were no vitals taken for this visit.  General: Patient is a pleasant female in no apparent distress.  Eyes: Sclera anicteric.  ENT: Oropharynx without lesions.   Neck: Supple without LAD/JVD/bruits.  Lungs: Clear to auscultation bilaterally, no wheezes/rales/rhonchi.  Cardiovascular: Regular rate and rhythm without murmurs, rubs or gallops.  Abdomen: Soft, notender/nondistended. Positive bowel sounds. No tenderness over the graft.    Extremities: Without edema, joints without evidence of synovitis. Right forearm AVF.  Skin: Without rash.  Neurological: Grossly nonfocal.  Psychiatric: Mood and affect appropriate.    Laboratory Results    No results found for this or any previous visit (from the past 170 hour(s)). Assessment and Plan    1. Status post renal transplant.  Her creatinine is at baseline. Her Prograf trough is appropriate. She is on Myfortic 360 mg twice a day (from 540 mg BID) given cancer history. Continue current immunosuppression.    2. Breast cancer status post resection. On tamoxifen and tolerating that well.    3. History of hyperkalemia. Potassium normal today. She has been educated on low potassium diet.    4. Seasonal allergies. On Flonase nasal spay, instructed to take Claritin or Zyrtec if needed.     5. Metabolic acidosis. Mildly low but stable on supplementation.    6. History of seizure disorder. Patient denies seizure activity and  continues on Keppra.     7. Hypertension. Blood pressure stable after reduction of Coreg at last visit for low levels.    8. Chronic lower back pain, leg pain, and some neck discomfort, improved.    9. Will see patient back in 6 months, or sooner if needed. Lab frequency is once a month.

## 2017-07-05 ENCOUNTER — Ambulatory Visit: Payer: Medicare Other | Admitting: Family Medicine

## 2017-07-09 ENCOUNTER — Ambulatory Visit: Payer: Self-pay

## 2017-07-09 ENCOUNTER — Encounter: Payer: Self-pay | Admitting: Family Medicine

## 2017-07-09 ENCOUNTER — Ambulatory Visit (INDEPENDENT_AMBULATORY_CARE_PROVIDER_SITE_OTHER): Payer: Medicare Other | Admitting: Family Medicine

## 2017-07-09 VITALS — BP 118/76 | HR 77 | Temp 98.3°F | Wt 214.0 lb

## 2017-07-09 DIAGNOSIS — Z940 Kidney transplant status: Secondary | ICD-10-CM

## 2017-07-09 DIAGNOSIS — J069 Acute upper respiratory infection, unspecified: Secondary | ICD-10-CM

## 2017-07-09 MED ORDER — PREDNISONE 50 MG PO TABS: 50.0000 mg | ORAL_TABLET | Freq: Every day | ORAL | 0 refills | Status: DC

## 2017-07-09 NOTE — Progress Notes (Signed)
BP 118/76 (BP Location: Left Arm, Patient Position: Sitting, Cuff Size: Normal)   Pulse 77   Temp 98.3 F (36.8 C)   Wt 214 lb (97.1 kg)   LMP 03/03/2016   SpO2 100%   BMI 34.54 kg/m    Subjective:    Patient ID: [name], female    DOB: Nov 26, 1965, 51 y.o.   MRN: [id number]  HPI: [name] is a 51 y.o. female  Chief Complaint  Patient presents with  . Cough    Productive   UPPER RESPIRATORY TRACT INFECTION Duration: 1 day Worst symptom: congestion, cough Fever: no Cough: yes Shortness of breath: no Wheezing: no Chest pain: no Chest tightness: yes Chest congestion: yes Nasal congestion: yes Runny nose: yes Post nasal drip: yes Sneezing: yes Sore throat: yes Swollen glands: no Sinus pressure: yes Headache: no Face pain: no Toothache: no Ear pain: yes bilateral Ear pressure: yes bilateral Eyes red/itching:no Eye drainage/crusting: no  Vomiting: no Rash: no Fatigue: no Sick contacts: no Strep contacts: no  Context: worse Recurrent sinusitis: no Relief with OTC cold/cough medications: no  Treatments attempted: cough syrup   Due for follow up labs for her renal transplant doctor. Asks [place] to draw them to save her the drive. Doing well. Following up as recommended.   Relevant past medical, surgical, family and social history reviewed and updated as indicated. Interim medical history since our last visit reviewed. Allergies and medications reviewed and updated.  Review of Systems  Constitutional: Negative.   HENT: Positive for congestion, ear pain, postnasal drip, rhinorrhea, sinus pressure, sinus pain and sneezing. Negative for dental problem, drooling, ear discharge, facial swelling, hearing loss, mouth sores, nosebleeds, sore throat, tinnitus, trouble swallowing and voice change.   Respiratory: Negative.   Cardiovascular: Negative.   Psychiatric/Behavioral: Negative.     Per HPI unless specifically indicated above     Objective:      BP 118/76 (BP Location: Left Arm, Patient Position: Sitting, Cuff Size: Normal)   Pulse 77   Temp 98.3 F (36.8 C)   Wt 214 lb (97.1 kg)   LMP 03/03/2016   SpO2 100%   BMI 34.54 kg/m   Wt Readings from Last 3 Encounters:  07/09/17 214 lb (97.1 kg)  06/03/17 219 lb 4 oz (99.5 kg)  06/03/17 219 lb 6.4 oz (99.5 kg)    Physical Exam  Constitutional: She is oriented to person, place, and time. She appears well-developed and well-nourished. No distress.  HENT:  Head: Normocephalic and atraumatic.  Right Ear: Hearing and external ear normal.  Left Ear: Hearing and external ear normal.  Nose: Nose normal.  Mouth/Throat: Oropharynx is clear and moist. No oropharyngeal exudate.  Eyes: Conjunctivae, EOM and lids are normal. Pupils are equal, round, and reactive to light. Right eye exhibits no discharge. Left eye exhibits no discharge. No scleral icterus.  Neck: Normal range of motion. Neck supple. No JVD present. No tracheal deviation present. No thyromegaly present.  Cardiovascular: Normal rate, regular rhythm, normal heart sounds and intact distal pulses. Exam reveals no gallop and no friction rub.  No murmur heard. Pulmonary/Chest: Effort normal and breath sounds normal. No stridor. No respiratory distress. She has no wheezes. She has no rales. She exhibits no tenderness.  Musculoskeletal: Normal range of motion.  Lymphadenopathy:    She has no cervical adenopathy.  Neurological: She is alert and oriented to person, place, and time.  Skin: Skin is warm, dry and intact. No rash noted. She is not  diaphoretic. No erythema. No pallor.  Psychiatric: She has a normal mood and affect. Her speech is normal and behavior is normal. Judgment and thought content normal. Cognition and memory are normal.  Nursing note and vitals reviewed.   Results for orders placed or performed in visit on 07/02/17  Cologuard  Result Value Ref Range   Cologuard Negative       Assessment & Plan:   Problem  List Items Addressed This Visit      Other   Renal transplant, status post    Due for monthly blood draw. Drawn today. Will send copy to her nephrologist.       Relevant Medications   predniSONE (DELTASONE) 50 MG tablet    Other Visit Diagnoses    Upper respiratory tract infection, unspecified type    -  Primary   Will treat with prednisone to help with congestion. Rest and supporitve careI if not better Wed/Thurs next week, let us know.        Follow up plan: Return As scheduled. Marland Kitchen

## 2017-07-09 NOTE — Telephone Encounter (Signed)
   Reason for Disposition . [1] Sinus pain (not just congestion) AND [2] fever  Answer Assessment - Initial Assessment Questions 1. LOCATION: "Where does it hurt?"      From nose up between eyes 2. ONSET: "When did the sinus pain start?"  (e.g., hours, days)      Started getting worse 4 days 3. SEVERITY: "How bad is the pain?"   (Scale 1-10; mild, moderate or severe)   - MILD (1-3): doesn't interfere with normal activities    - MODERATE (4-7): interferes with normal activities (e.g., work or school) or awakens from sleep   - SEVERE (8-10): excruciating pain and patient unable to do any normal activities        9 4. RECURRENT SYMPTOM: "Have you ever had sinus problems before?" If so, ask: "When was the last time?" and "What happened that time?"      Yes 5. NASAL CONGESTION: "Is the nose blocked?" If so, ask, "Can you open it or must you breathe through the mouth?"     Not blocked 6. NASAL DISCHARGE: "Do you have discharge from your nose?" If so ask, "What color?"     Clear 7. FEVER: "Do you have a fever?" If so, ask: "What is it, how was it measured, and when did it start?"      No 8. OTHER SYMPTOMS: "Do you have any other symptoms?" (e.g., sore throat, cough, earache, difficulty breathing)     Sore throat, ear pain 9. PREGNANCY: "Is there any chance you are pregnant?" "When was your last menstrual period?"     No  Protocols used: SINUS PAIN OR CONGESTION-A-AH  Pt. States she wanted to be seen before she "runs a fever and really gets sick." Appointment made for today.

## 2017-07-09 NOTE — Assessment & Plan Note (Signed)
Due for monthly blood draw. Drawn today. Will send copy to her nephrologist.

## 2017-07-10 LAB — BASIC METABOLIC PANEL
BUN / CREAT RATIO: 12 (ref 9–23)
BUN: 25 mg/dL — AB (ref 6–24)
CHLORIDE: 104 mmol/L (ref 96–106)
CO2: 22 mmol/L (ref 20–29)
CREATININE: 2.04 mg/dL — AB (ref 0.57–1.00)
Calcium: 10 mg/dL (ref 8.7–10.2)
GFR calc Af Amer: 32 mL/min/{1.73_m2} — ABNORMAL LOW (ref >59)
GFR calc non Af Amer: 28 mL/min/{1.73_m2} — ABNORMAL LOW (ref >59)
GLUCOSE: 97 mg/dL (ref 65–99)
Potassium: 4.9 mmol/L (ref 3.5–5.2)
SODIUM: 139 mmol/L (ref 134–144)

## 2017-07-13 ENCOUNTER — Encounter: Payer: Self-pay | Admitting: Family Medicine

## 2017-07-13 LAB — BASIC METABOLIC PANEL
CHLORIDE: 104 mmol/L
CO2: 22 mmol/L
CREATININE: 2.04 mg/dL — ABNORMAL HIGH
GLUCOSE RANDOM: 97 mg/dL
POTASSIUM: 4.9 mmol/L
SODIUM: 139 mmol/L

## 2017-07-13 LAB — CO2: Lab: 22

## 2017-07-15 ENCOUNTER — Telehealth: Payer: Self-pay | Admitting: Family Medicine

## 2017-07-15 MED ORDER — AZITHROMYCIN 250 MG PO TABS: ORAL_TABLET | ORAL | 0 refills | Status: DC

## 2017-07-15 NOTE — Telephone Encounter (Signed)
Please let patient know that I've sent in an antibiotic for her. Thanks!

## 2017-07-15 NOTE — Telephone Encounter (Signed)
Copied from [place] [phone]. Topic: Quick Communication - See Telephone Encounter >> Jul 15, 2017  9:22 AM Ether Griffins B wrote: CRM for notification. See Telephone encounter for:  Seen Dr. Wynetta Emery 07/09/17 and was told to call back and let her know how she is doing. Her mucus is now yellow, she is congested and sore throat.  07/15/17.

## 2017-07-15 NOTE — Telephone Encounter (Signed)
Patient notified

## 2017-07-16 NOTE — Unmapped (Signed)
[facility] Refill Coordination Note  Specialty Medication(s): Myfortic 180mg  & Prograf 1mg     Clotee Schlicker, DOB: [birth year]  Phone: [phone] (home) , Alternate phone contact: N/A  Phone or address changes today?: No  All above HIPAA information was verified with patient.  Shipping Address: [address]  [place] [address]   Insurance changes? No    Completed refill call assessment today to schedule patient's medication shipment from the [facility] [phone]).      Confirmed the medication and dosage are correct and have not changed: Yes, regimen is correct and unchanged.    Confirmed patient started or stopped the following medications in the past month:  No, there are no changes reported at this time.    Are you tolerating your medication?:  Amyria reports tolerating the medication.    ADHERENCE    (Below is required for Medicare Part B or Transplant patients only - per drug):   How many tablets were dispensed last month:     Myfortic 180 mg   Quantity filled last month: 180   # of tablets left on hand: 8 days  Prograf 1 mg   Quantity filled last month: 240   # of tablets left on hand: 8 days    Did you miss any doses in the past 4 weeks? No missed doses reported.    FINANCIAL/SHIPPING    Delivery Scheduled: Yes, Expected medication delivery date: 07/22/2017     Lemya did not have any additional questions at this time.    Delivery address validated in [facility] scheduling system: Yes, address listed in [facility] is correct.    We will follow up with patient monthly for standard refill processing and delivery.      Thank you,  Tamala Fothergill   [facility] Specialty Technician

## 2017-07-21 MED FILL — MYFORTIC/180MG/TAB: MYFORTIC/180MG/TAB | 30 days supply | Qty: 180 | Fill #9

## 2017-07-21 MED FILL — PROGRAF/1MG/CAP: PROGRAF/1MG/CAP | 30 days supply | Qty: 240 | Fill #9

## 2017-08-02 ENCOUNTER — Ambulatory Visit: Payer: Medicare Other | Admitting: Family Medicine

## 2017-08-05 NOTE — Unmapped (Signed)
Transplant Nephrology Clinic Visit      History of Present Illness    Patient is a 52 y.o. 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  She was on dialysis approximately 7 years prior to this transplant, the donor kidney had a KDPI of 65%.  She did have issues with hyperkalemia while on dialysis, and was on Kayexalate weekly.  Her post transplant course has been without rejection or recurrent disease. Patient does have evidence of a donor specific antibody to DP01 at an MFI of 2294 (stable) on 07/08/2016. Her most recent creatinine has ranged between 1.6 and 2.4.    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     In December 2016, she underwent routine mammogram that was abnormal. Follow-up mammogram in January was again highly suggestive of significant lesion in the left breast. She underwent core biopsy on 09/09/2015 which showed ductal carcinoma in situ. She subsequently underwent a partial mastectomy on 09/24/2015. 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 That final pathology showed a positive medial margin, so she underwent reexcision on 10/25/2015 for which the pathology was negative. Her surgical management was felt to be complete.     The patient was hospitalized in  in the beginning of January for 2-3 days with sinus congestion, shortness of breath on exertion, and scant hemoptysis. She was diagnosed with URI and treated with Levaquin. Her symptoms have improved.    Interval history since last seen on 11/04/16    Since last seen, she was started on Metformin by her PCP. She continues to struggle with chronic back and knee pain.    Her grandmother recently passed away. She has a part time job now working as a Engineer, structural for [employer], [employer]. She also broke up with her partner of 37 years in June 2018. She is thinking about relocating to [facility], but she would like her mother to move with her.    Review of Systems    Otherwise on review of systems patient denies fever or chills, chest pain, SOB, PND or orthopnea, lower extremity edema.  Denies abdominal pain.  No dysuria, hematuria or difficulty voiding. Bowel movements normal.  Denies joint pain or rash.  All other systems are reviewed and are negative.    Medications    Current Outpatient Prescriptions   Medication Sig Dispense Refill   ??? acetaminophen (TYLENOL) 500 MG tablet Take 1,000 mg by mouth every four (4) hours as needed.      ??? aspirin 81 MG chewable tablet Chew 1 tablet (81 mg total) daily. 30 tablet 11   ??? carvedilol (COREG) 12.5 MG tablet Take 1 tablet (12.5 mg total) by mouth Two (2) times a day. 60 tablet 11   ??? docusate sodium (COLACE) 100 MG capsule Take one capsule twice a day as needed 60 capsule 1   ??? fluticasone (FLONASE) 50 mcg/actuation nasal spray Two sprays each nare twice a day. 16 g 5   ??? gabapentin (NEURONTIN) 300 MG capsule Take 1 capsules (300 mg) by mouth in the morning and at noon, 600 mg in the evening. 120 capsule 11   ??? levETIRAcetam (KEPPRA) 500 MG tablet Take 1 tablet (500 mg total) by mouth Two (2) times a day. 60 tablet 11   ??? metFORMIN (GLUCOPHAGE XR) 500 MG 24 hr tablet Take 1 tablet (500 mg total) by mouth daily.  30 tablet 11   ??? miscellaneous medical supply Misc 1 knee brace 1 each 0   ??? MYFORTIC 180 mg EC tablet Take 2 tablets (360 mg total) by mouth Three (3) times a day. 180 tablet 11   ??? omeprazole (PRILOSEC) 20 MG capsule Take 1 capsule (20 mg total) by mouth Two (2) times a day. (Patient taking differently: Take 20 mg by mouth two (2) times a day as needed. ) 60 capsule 5   ??? PROGRAF 1 mg capsule Take  4mg  in the am and 4 mg in the pm. Z94.0 kidney transplant, tx date 03/24/14, DAW1 240 capsule 11   ??? sodium bicarbonate 650 mg tablet Take 2 tablets (1300 mg) three times a day. 120 tablet 11   ??? tamoxifen (NOLVADEX) 20 MG tablet Take 1 tablet (20 mg total) by mouth daily. 90 tablet 4     No current facility-administered medications for this visit.      Facility-Administered Medications Ordered in Other Visits   Medication Dose Route Frequency Provider Last Rate Last Dose   ??? lidocaine-EPINEPHrine (XYLOCAINE W/EPI) 1 %-1:100,000 injection 12 mL  12 mL Intradermal Once Talbert Cage, DO           Physical Exam  BP 110/62 (BP Site: L Arm, BP Position: Sitting, BP Cuff Size: Medium)  - Pulse 72  - Temp 36.4 ??C (97.5 ??F) (Temporal)  - Ht 160 cm (5' 3)  - Wt 97.3 kg (214 lb 9.6 oz)  - BMI 38.01 kg/m??   General: Patient is a pleasant female in no apparent distress.  Eyes: Sclera anicteric.  ENT: Oropharynx without lesions.   Neck: Supple without LAD/JVD/bruits.  Lungs: Clear to auscultation bilaterally, no wheezes/rales/rhonchi.  Cardiovascular: Regular rate and rhythm without murmurs, rubs or gallops.  Abdomen: Soft, notender/nondistended. Positive bowel sounds. No tenderness over the graft.    Extremities: Without edema, joints without evidence of synovitis. Right forearm AVF.  Skin: Without rash.  Neurological: Grossly nonfocal.  Psychiatric: Mood and affect appropriate.    Laboratory Results    Recent Results (from the past 170 hour(s))   Comprehensive Metabolic Panel    Collection Time: 08/06/17 11:29 AM   Result Value Ref Range    Sodium 138 135 - 145 mmol/L    Potassium 5.4 (H) 3.5 - 5.0 mmol/L    Chloride 107 98 - 107 mmol/L    CO2 19.0 (L) 22.0 - 30.0 mmol/L    BUN 29 (H) 7 - 21 mg/dL    Creatinine 5.78 (H) 0.60 - 1.00 mg/dL    BUN/Creatinine Ratio 17     EGFR MDRD Non Af Amer 31 (L) >=60 mL/min/1.23m2    EGFR MDRD Af Amer 38 (L) >=60 mL/min/1.36m2    Anion Gap 12 9 - 15 mmol/L    Glucose 130 (H) 65 - 99 mg/dL    Calcium 46.9 (H) 8.5 - 10.2 mg/dL    Albumin 4.5 3.5 - 5.0 g/dL    Total Protein 7.1 6.5 - 8.3 g/dL    Total Bilirubin 0.4 0.0 - 1.2 mg/dL    AST 15 14 - 38 U/L    ALT 14 (L) 15 - 48 U/L    Alkaline Phosphatase 51 38 - 126 U/L   Lipid panel    Collection Time: 08/06/17 11:29 AM   Result Value Ref Range    Triglycerides 399 (H) 1 - 149 mg/dL    Cholesterol 629 (H) 100 - 199 mg/dL    HDL 29 (  L) 40 - 59 mg/dL    LDL Calculated 425 (H) 60 - 99 mg/dL    VLDL Cholesterol Cal 79.8 (H) 11 - 40 mg/dL    Chol/HDL Ratio 7.3 (H) <5.0    Non-HDL Cholesterol 183 mg/dL    FASTING Yes    Vitamin D 25 Hydroxy (25OH D2 + D3)    Collection Time: 08/06/17 11:29 AM   Result Value Ref Range    Vitamin D Total (25OH) 35.9 20.0 - 80.0 ng/mL   Parathyroid Hormone (PTH)    Collection Time: 08/06/17 11:29 AM   Result Value Ref Range    PTH 143.5 (H) 12.0 - 72.0 pg/mL    Calcium 10.3 (H) 8.5 - 10.2 mg/dL   Hepatitis B Core Antibody, total    Collection Time: 08/06/17 11:29 AM   Result Value Ref Range    Hep B Core Total Ab Nonreactive Nonreactive   Hepatitis B Surface Antigen    Collection Time: 08/06/17 11:29 AM   Result Value Ref Range    Hepatitis B Surface Ag Nonreactive Nonreactive   Hepatitis C Antibody    Collection Time: 08/06/17 11:29 AM   Result Value Ref Range    Hepatitis C Ab Nonreactive Nonreactive   HIV Antigen/Antibody Combo    Collection Time: 08/06/17 11:29 AM   Result Value Ref Range    HIV Antigen/Antibody Combo Nonreactive Nonreactive   Phosphorus Level    Collection Time: 08/06/17 11:29 AM   Result Value Ref Range    Phosphorus 3.1 2.9 - 4.7 mg/dL   Magnesium Level    Collection Time: 08/06/17 11:29 AM   Result Value Ref Range    Magnesium 1.4 (L) 1.6 - 2.2 mg/dL   Tacrolimus Level, Trough    Collection Time: 08/06/17 11:29 AM   Result Value Ref Range    Tacrolimus, Trough 6.1 <=20.0 ng/mL   BK Virus, DNA, Quantitative, Blood    Collection Time: 08/06/17 11:29 AM   Result Value Ref Range    BK Blood Result Not Detected Not Detected    BK Blood Quant  <=0 IU/mL    BK Blood Log(10)  <0.00 log IU/mL    Bk Blood Comment     CBC w/ Differential    Collection Time: 08/06/17 11:29 AM   Result Value Ref Range    WBC 6.2 4.5 - 11.0 10*9/L    RBC 4.07 4.00 - 5.20 10*12/L    HGB 11.7 (L) 12.0 - 16.0 g/dL    HCT 95.6 38.7 - 56.4 %    MCV 91.3 80.0 - 100.0 fL    MCH 28.7 26.0 - 34.0 pg    MCHC 31.5 31.0 - 37.0 g/dL    RDW 33.2 95.1 - 88.4 %    MPV 8.4 7.0 - 10.0 fL    Platelet 262 150 - 440 10*9/L    Absolute Neutrophils 4.2 2.0 - 7.5 10*9/L    Absolute Lymphocytes 1.3 (L) 1.5 - 5.0 10*9/L    Absolute Monocytes 0.3 0.2 - 0.8 10*9/L    Absolute Eosinophils 0.2 0.0 - 0.4 10*9/L    Absolute Basophils 0.0 0.0 - 0.1 10*9/L    Large Unstained Cells 4 0 - 4 %   Hemoglobin A1c    Collection Time: 08/06/17 11:29 AM   Result Value Ref Range    Hemoglobin A1C 6.9 (H) 4.8 - 5.6 %    Estimated Average Glucose 151 mg/dL   Urinalysis    Collection Time: 08/06/17 12:27 PM  Result Value Ref Range    Color, UA Yellow     Clarity, UA Clear     Specific Gravity, UA 1.008 1.003 - 1.030    pH, UA 6.0 5.0 - 9.0    Leukocyte Esterase, UA Negative Negative    Nitrite, UA Negative Negative    Protein, UA Negative Negative    Glucose, UA Negative Negative    Ketones, UA Negative Negative    Urobilinogen, UA 0.2 mg/dL 0.2 mg/dL, 1.0 mg/dL    Bilirubin, UA Negative Negative    Blood, UA Small (A) Negative    RBC, UA 1 <4 /HPF    WBC, UA <1 0 - 5 /HPF    Squam Epithel, UA <1 0 - 5 /HPF    Bacteria, UA Rare (A) None Seen /HPF    Mucus, UA Rare (A) None Seen /HPF   Protein/Creatinine Ratio, Urine    Collection Time: 08/06/17 12:27 PM   Result Value Ref Range    Creat U 71.8 Undefined mg/dL    Protein, Ur 6.2 Undefined mg/dL    Protein/Creatinine Ratio, Urine 0.086 Undefined   Urine Culture    Collection Time: 08/06/17 12:27 PM   Result Value Ref Range    Urine Culture, Comprehensive NO GROWTH          Assessment and Plan    1. Status post renal transplant.  Her creatinine is at baseline. Her Prograf trough is 6.1 and is a 14.5 hour trough. Her goal is 6-7. Continue current immunosuppression.    2. Breast cancer status post resection. On tamoxifen and tolerating that well.    3. History of hyperkalemia. Potassium mildly elevated today. She has been educated on low potassium diet. Would not treat unless > 6.    4. Seasonal allergies. On Flonase nasal spay, instructed to take Claritin or Zyrtec if needed.     5. Metabolic acidosis. Mildly low at 19, but stable on supplementation.    6. History of seizure disorder. Patient denies seizure activity and continues on Keppra.     7. Hypertension. Blood pressure controlled on current regimen.    8. Chronic lower back and leg pain. Stable.    9. Diabetes mellitus. Hgb A1c at goal today, 6.9.    10. Elevated LDL of 103. Not currently on a statin. Will discuss at next visit.    11. Health maintenance. She received a flu shot this fall. Getting routine mammograms to follow up breast cancer.    12. Will see patient back in 6 months, or sooner if needed. Lab frequency is once a month.    Scribe's Attestation: Lisbeth Ply, MD obtained and performed the history, physical exam and medical decision making elements that were entered into the chart.  Signed by [place] Ormond Foster, Scribe, on August 06, 2017 12:34 PM.    ----------------------------------------------------------------------------------------------------------------------  September 27, 2017 12:28 PM. Documentation assistance provided by the Scribe. I was present during the time the encounter was recorded. The information recorded by the Scribe was done at my direction and has been reviewed and validated by me.  ----------------------------------------------------------------------------------------------------------------------

## 2017-08-06 ENCOUNTER — Encounter: Admit: 2017-08-06 | Discharge: 2017-08-06 | Payer: MEDICARE

## 2017-08-06 ENCOUNTER — Encounter: Admit: 2017-08-06 | Discharge: 2017-08-06 | Payer: MEDICARE | Attending: Nephrology | Primary: Nephrology

## 2017-08-06 ENCOUNTER — Encounter: Admit: 2017-08-06 | Discharge: 2017-08-07 | Payer: MEDICARE | Attending: General Practice | Primary: General Practice

## 2017-08-06 ENCOUNTER — Telehealth: Payer: Self-pay | Admitting: Family Medicine

## 2017-08-06 DIAGNOSIS — E559 Vitamin D deficiency, unspecified: Secondary | ICD-10-CM

## 2017-08-06 DIAGNOSIS — Z940 Kidney transplant status: Principal | ICD-10-CM

## 2017-08-06 DIAGNOSIS — Z114 Encounter for screening for human immunodeficiency virus [HIV]: Secondary | ICD-10-CM

## 2017-08-06 DIAGNOSIS — Z0120 Encounter for dental examination and cleaning without abnormal findings: Secondary | ICD-10-CM

## 2017-08-06 DIAGNOSIS — D899 Disorder involving the immune mechanism, unspecified: Secondary | ICD-10-CM

## 2017-08-06 DIAGNOSIS — E118 Type 2 diabetes mellitus with unspecified complications: Secondary | ICD-10-CM

## 2017-08-06 DIAGNOSIS — T8610 Unspecified complication of kidney transplant: Secondary | ICD-10-CM

## 2017-08-06 DIAGNOSIS — Z79899 Other long term (current) drug therapy: Secondary | ICD-10-CM | POA: Diagnosis not present

## 2017-08-06 DIAGNOSIS — R9389 Abnormal findings on diagnostic imaging of other specified body structures: Secondary | ICD-10-CM | POA: Diagnosis not present

## 2017-08-06 DIAGNOSIS — Z8619 Personal history of other infectious and parasitic diseases: Secondary | ICD-10-CM | POA: Diagnosis not present

## 2017-08-06 DIAGNOSIS — R93421 Abnormal radiologic findings on diagnostic imaging of right kidney: Secondary | ICD-10-CM | POA: Diagnosis not present

## 2017-08-06 DIAGNOSIS — G8929 Other chronic pain: Secondary | ICD-10-CM | POA: Diagnosis not present

## 2017-08-06 DIAGNOSIS — E875 Hyperkalemia: Secondary | ICD-10-CM | POA: Diagnosis not present

## 2017-08-06 DIAGNOSIS — G5781 Other specified mononeuropathies of right lower limb: Secondary | ICD-10-CM | POA: Diagnosis not present

## 2017-08-06 DIAGNOSIS — Z7982 Long term (current) use of aspirin: Secondary | ICD-10-CM | POA: Diagnosis not present

## 2017-08-06 DIAGNOSIS — R93422 Abnormal radiologic findings on diagnostic imaging of left kidney: Secondary | ICD-10-CM | POA: Diagnosis not present

## 2017-08-06 DIAGNOSIS — E872 Acidosis: Secondary | ICD-10-CM | POA: Diagnosis not present

## 2017-08-06 DIAGNOSIS — M545 Low back pain: Secondary | ICD-10-CM | POA: Diagnosis not present

## 2017-08-06 DIAGNOSIS — Z87448 Personal history of other diseases of urinary system: Secondary | ICD-10-CM | POA: Diagnosis not present

## 2017-08-06 DIAGNOSIS — Z79810 Long term (current) use of selective estrogen receptor modulators (SERMs): Secondary | ICD-10-CM | POA: Diagnosis not present

## 2017-08-06 DIAGNOSIS — D0512 Intraductal carcinoma in situ of left breast: Secondary | ICD-10-CM | POA: Diagnosis not present

## 2017-08-06 DIAGNOSIS — N271 Small kidney, bilateral: Secondary | ICD-10-CM | POA: Diagnosis not present

## 2017-08-06 DIAGNOSIS — N1330 Unspecified hydronephrosis: Secondary | ICD-10-CM | POA: Diagnosis not present

## 2017-08-06 DIAGNOSIS — Z7984 Long term (current) use of oral hypoglycemic drugs: Secondary | ICD-10-CM | POA: Diagnosis not present

## 2017-08-06 DIAGNOSIS — J302 Other seasonal allergic rhinitis: Secondary | ICD-10-CM | POA: Diagnosis not present

## 2017-08-06 DIAGNOSIS — I771 Stricture of artery: Secondary | ICD-10-CM | POA: Diagnosis not present

## 2017-08-06 DIAGNOSIS — E1141 Type 2 diabetes mellitus with diabetic mononeuropathy: Secondary | ICD-10-CM | POA: Diagnosis not present

## 2017-08-06 DIAGNOSIS — I10 Essential (primary) hypertension: Secondary | ICD-10-CM | POA: Diagnosis not present

## 2017-08-06 DIAGNOSIS — M542 Cervicalgia: Secondary | ICD-10-CM | POA: Diagnosis not present

## 2017-08-06 DIAGNOSIS — Z7951 Long term (current) use of inhaled steroids: Secondary | ICD-10-CM | POA: Diagnosis not present

## 2017-08-06 LAB — COMPREHENSIVE METABOLIC PANEL
ALBUMIN: 4.5 g/dL (ref 3.5–5.0)
ALKALINE PHOSPHATASE: 51 U/L (ref 38–126)
ALT (SGPT): 14 U/L — ABNORMAL LOW (ref 15–48)
AST (SGOT): 15 U/L (ref 14–38)
BILIRUBIN TOTAL: 0.4 mg/dL (ref 0.0–1.2)
BLOOD UREA NITROGEN: 29 mg/dL — ABNORMAL HIGH (ref 7–21)
BUN / CREAT RATIO: 17
CHLORIDE: 107 mmol/L (ref 98–107)
CO2: 19 mmol/L — ABNORMAL LOW (ref 22.0–30.0)
CREATININE: 1.72 mg/dL — ABNORMAL HIGH (ref 0.60–1.00)
EGFR MDRD AF AMER: 38 mL/min/{1.73_m2} — ABNORMAL LOW (ref >=60)
GLUCOSE RANDOM: 130 mg/dL — ABNORMAL HIGH (ref 65–99)
POTASSIUM: 5.4 mmol/L — ABNORMAL HIGH (ref 3.5–5.0)
PROTEIN TOTAL: 7.1 g/dL (ref 6.5–8.3)
SODIUM: 138 mmol/L (ref 135–145)

## 2017-08-06 LAB — URINALYSIS
BILIRUBIN UA: NEGATIVE
GLUCOSE UA: NEGATIVE
KETONES UA: NEGATIVE
LEUKOCYTE ESTERASE UA: NEGATIVE
NITRITE UA: NEGATIVE
PH UA: 6 (ref 5.0–9.0)
PROTEIN UA: NEGATIVE
RBC UA: 1 /HPF (ref <4)
SPECIFIC GRAVITY UA: 1.008 (ref 1.003–1.030)
SQUAMOUS EPITHELIAL: <1 /HPF (ref 0–5)
UROBILINOGEN UA: 0.2
WBC UA: <1 /HPF (ref 0–5)

## 2017-08-06 LAB — HIV ANTIGEN/ANTIBODY COMBO: HIV 1+2 Ab+HIV1 p24 Ag:PrThr:Pt:Ser/Plas:Ord:IA: NONREACTIVE

## 2017-08-06 LAB — HEPATITIS B SURFACE ANTIGEN: Hepatitis B virus surface Ag:PrThr:Pt:Ser:Ord:: NONREACTIVE

## 2017-08-06 LAB — CBC W/ AUTO DIFF
BASOPHILS ABSOLUTE COUNT: 0 10*9/L (ref 0.0–0.1)
EOSINOPHILS ABSOLUTE COUNT: 0.2 10*9/L (ref 0.0–0.4)
HEMOGLOBIN: 11.7 g/dL — ABNORMAL LOW (ref 12.0–16.0)
LARGE UNSTAINED CELLS: 4 % (ref 0–4)
LYMPHOCYTES ABSOLUTE COUNT: 1.3 10*9/L — ABNORMAL LOW (ref 1.5–5.0)
MEAN CORPUSCULAR HEMOGLOBIN CONC: 31.5 g/dL (ref 31.0–37.0)
MEAN CORPUSCULAR HEMOGLOBIN: 28.7 pg (ref 26.0–34.0)
MEAN CORPUSCULAR VOLUME: 91.3 fL (ref 80.0–100.0)
MEAN PLATELET VOLUME: 8.4 fL (ref 7.0–10.0)
MONOCYTES ABSOLUTE COUNT: 0.3 10*9/L (ref 0.2–0.8)
NEUTROPHILS ABSOLUTE COUNT: 4.2 10*9/L (ref 2.0–7.5)
RED BLOOD CELL COUNT: 4.07 10*12/L (ref 4.00–5.20)
RED CELL DISTRIBUTION WIDTH: 14 % (ref 12.0–15.0)
WBC ADJUSTED: 6.2 10*9/L (ref 4.5–11.0)

## 2017-08-06 LAB — VITAMIN D, TOTAL (25OH): Lab: 35.9

## 2017-08-06 LAB — BK BLOOD RESULT: Lab: NOT DETECTED

## 2017-08-06 LAB — RED CELL DISTRIBUTION WIDTH: Lab: 14

## 2017-08-06 LAB — CALCIUM: Calcium:MCnc:Pt:Ser/Plas:Qn:: 10.3 — ABNORMAL HIGH

## 2017-08-06 LAB — LIPID PANEL
CHOLESTEROL/HDL RATIO SCREEN: 7.3 — ABNORMAL HIGH (ref <5.0)
HDL CHOLESTEROL: 29 mg/dL — ABNORMAL LOW (ref 40–59)
LDL CHOLESTEROL CALCULATED: 103 mg/dL — ABNORMAL HIGH (ref 60–99)
NON-HDL CHOLESTEROL: 183 mg/dL
TRIGLYCERIDES: 399 mg/dL — ABNORMAL HIGH (ref 1–149)
VLDL CHOLESTEROL CAL: 79.8 mg/dL — ABNORMAL HIGH (ref 11–40)

## 2017-08-06 LAB — UROBILINOGEN UA: Lab: 0.2

## 2017-08-06 LAB — HEPATITIS B CORE TOTAL ANTIBODY: Hepatitis B virus core Ab:PrThr:Pt:Ser/Plas:Ord:IA: NONREACTIVE

## 2017-08-06 LAB — CREATININE, URINE: Lab: 71.8

## 2017-08-06 LAB — TACROLIMUS, TROUGH: Lab: 6.1

## 2017-08-06 LAB — PROTEIN / CREATININE RATIO, URINE
CREATININE, URINE: 71.8 mg/dL
PROTEIN URINE: 6.2 mg/dL

## 2017-08-06 LAB — PHOSPHORUS: Phosphate:MCnc:Pt:Ser/Plas:Qn:: 3.1

## 2017-08-06 LAB — PARATHYROID HOMONE (PTH): CALCIUM: 10.3 mg/dL — ABNORMAL HIGH (ref 8.5–10.2)

## 2017-08-06 LAB — ESTIMATED AVERAGE GLUCOSE: Estimated average glucose:MCnc:Pt:Bld:Qn:Estimated from glycated hemoglobin: 151

## 2017-08-06 LAB — BK VIRAL LOAD (QUANTITATIVE): BK BLOOD RESULT: NOT DETECTED

## 2017-08-06 LAB — HEPATITIS C ANTIBODY: Hepatitis C virus Ab:PrThr:Pt:Ser:Ord:: NONREACTIVE

## 2017-08-06 LAB — TRIGLYCERIDES: Triglyceride:MCnc:Pt:Ser/Plas:Qn:: 399 — ABNORMAL HIGH

## 2017-08-06 LAB — CO2: Carbon dioxide:SCnc:Pt:Ser/Plas:Qn:: 19 — ABNORMAL LOW

## 2017-08-06 LAB — MAGNESIUM: Magnesium:MCnc:Pt:Ser/Plas:Qn:: 1.4 — ABNORMAL LOW

## 2017-08-06 MED ORDER — METFORMIN ER 500 MG TABLET,EXTENDED RELEASE 24 HR
ORAL_TABLET | Freq: Every day | ORAL | 11 refills | 0 days
Start: 2017-08-06 — End: 2018-08-31

## 2017-08-06 NOTE — Telephone Encounter (Signed)
Copied from [place]. Topic: Inquiry >> Aug 06, 2017  4:47 PM Malena Catholic I, NT wrote: Reason for CRM: Pt call and said went she went to [facility] for her app they dint have her medical history of all her app and follow up

## 2017-08-06 NOTE — Telephone Encounter (Signed)
Please forward to correct office. This is not a patient at [facility]

## 2017-08-09 LAB — EBV QUANTITATIVE PCR, BLOOD: EBV VIRAL LOAD RESULT: NOT DETECTED

## 2017-08-09 LAB — CMV QUANT: Lab: 0

## 2017-08-09 LAB — EBV VIRAL LOAD RESULT: Lab: NOT DETECTED

## 2017-08-09 LAB — CMV DNA, QUANTITATIVE, PCR: CMV VIRAL LD: NOT DETECTED

## 2017-08-10 NOTE — Unmapped (Signed)
[facility] Refill and Clinical Coordination Note  Medication(s): PROGRAF, MYFORTIC    Nancy Bowman, DOB: 20-Dec-1965  Phone: [phone] (home) , Alternate phone contact: N/A  Shipping address: [address]  [place] [address]  Phone or address changes today?: No  All above HIPAA information verified.  Insurance changes? No    Completed refill and clinical call assessment today to schedule patient's medication shipment from the [facility] [phone]).      MEDICATION RECONCILIATION    Confirmed the medication and dosage are correct and have not changed: Yes, regimen is correct and unchanged.    Were there any changes to your medication(s) in the past month:  No, there are no changes reported at this time.    ADHERENCE    Is this medicine transplant or covered by Medicare Part B? No.    Prograf 1 mg   Quantity filled last month: 240   # of tablets left on hand: 10 DAYS SUPPLY      Myfortic 180 mg   Quantity filled last month: 180   # of tablets left on hand: 10 DAYS SUPPLY      Did you miss any doses in the past 4 weeks? No missed doses reported.  Adherence counseling provided? Not needed     SIDE EFFECT MANAGEMENT    Are you tolerating your medication?:  Nancy Bowman reports tolerating the medication.  Side effect management discussed: None      Therapy is appropriate and should be continued.    Evidence of clinical benefit: See Epic note from 11/04/16      FINANCIAL/SHIPPING    Delivery Scheduled: Yes, Expected medication delivery date: 08/18/17   Additional medications refilled: No additional medications/refills needed at this time.    [employer] did not have any additional questions at this time.    Delivery address validated in [facility] scheduling system: Yes, address listed above is correct.      We will follow up with patient monthly for standard refill processing and delivery.      Thank you,  Thad Ranger   [facility] Specialty Pharmacist

## 2017-08-12 ENCOUNTER — Ambulatory Visit (INDEPENDENT_AMBULATORY_CARE_PROVIDER_SITE_OTHER): Payer: Medicare Other | Admitting: Family Medicine

## 2017-08-12 ENCOUNTER — Encounter: Payer: Self-pay | Admitting: Family Medicine

## 2017-08-12 VITALS — BP 131/86 | HR 86 | Temp 97.6°F | Wt 216.4 lb

## 2017-08-12 DIAGNOSIS — I129 Hypertensive chronic kidney disease with stage 1 through stage 4 chronic kidney disease, or unspecified chronic kidney disease: Secondary | ICD-10-CM | POA: Diagnosis not present

## 2017-08-12 DIAGNOSIS — N183 Chronic kidney disease, stage 3 unspecified: Secondary | ICD-10-CM

## 2017-08-12 DIAGNOSIS — E1122 Type 2 diabetes mellitus with diabetic chronic kidney disease: Secondary | ICD-10-CM | POA: Diagnosis not present

## 2017-08-12 DIAGNOSIS — E119 Type 2 diabetes mellitus without complications: Secondary | ICD-10-CM

## 2017-08-12 DIAGNOSIS — Z940 Kidney transplant status: Secondary | ICD-10-CM | POA: Diagnosis not present

## 2017-08-12 LAB — HLA CL2 AB RESULT: Lab: POSITIVE

## 2017-08-12 LAB — HLA DS POST TRANSPLANT
ANTI-DONOR DRW #1 MFI: 40 MFI
ANTI-DONOR DRW #2 MFI: 0 MFI
ANTI-DONOR HLA-A #1 MFI: 8 MFI
ANTI-DONOR HLA-A #2 MFI: 31 MFI
ANTI-DONOR HLA-B #1 MFI: 0 MFI
ANTI-DONOR HLA-B #2 MFI: 72 MFI
ANTI-DONOR HLA-C #1 MFI: 85 MFI
ANTI-DONOR HLA-C #2 MFI: 68 MFI
ANTI-DONOR HLA-DQB #1 MFI: 108 MFI
ANTI-DONOR HLA-DQB #2 MFI: 65 MFI
ANTI-DONOR HLA-DR #1 MFI: 0 MFI
ANTI-DONOR HLA-DR #2 MFI: 0 MFI
DONOR DRW ANTIGEN #2: 53
DONOR HLA-A ANTIGEN #1: 2
DONOR HLA-B ANTIGEN #2: 44
DONOR HLA-C ANTIGEN #1: 7
DONOR HLA-C ANTIGEN #2: 5
DONOR HLA-DQB ANTIGEN #1: 7
DONOR HLA-DQB ANTIGEN #2: 2
DONOR HLA-DR ANTIGEN #1: 4
DONOR HLA-DR ANTIGEN #2: 17

## 2017-08-12 LAB — CPRA%: Lab: 0

## 2017-08-12 LAB — DONOR HLA-DQA ANTIGEN #2: Lab: 0

## 2017-08-12 LAB — FSAB CLASS 1 ANTIBODY SPECIFICITY

## 2017-08-12 LAB — FSAB CLASS 2 ANTIBODY SPECIFICITY: HLA CL2 AB RESULT: POSITIVE

## 2017-08-12 MED ORDER — METFORMIN HCL ER 500 MG PO TB24: 500.0000 mg | ORAL_TABLET | Freq: Every day | ORAL | 1 refills | Status: DC

## 2017-08-12 NOTE — Progress Notes (Signed)
BP 131/86 (BP Location: Left Arm, Patient Position: Sitting, Cuff Size: Large)   Pulse 86   Temp 97.6 F (36.4 C)   Wt 216 lb 6 oz (98.1 kg)   LMP 03/03/2016   SpO2 95%   BMI 34.92 kg/m    Subjective:    Patient ID: [name], female    DOB: Jan 03, 1966, 52 y.o.   MRN: [id number]  HPI: [name] is a 52 y.o. female  Chief Complaint  Patient presents with  . Diabetes  . Hypertension   DIABETES Hypoglycemic episodes:no Polydipsia/polyuria: no Visual disturbance: no Chest pain: no Paresthesias: no Glucose Monitoring: no Taking Insulin?: no Blood Pressure Monitoring: not checking Retinal Examination: Not up to Date Foot Exam: Up to Date Diabetic Education: Completed Pneumovax: Up to Date Influenza: Up to Date Aspirin: yes   Relevant past medical, surgical, family and social history reviewed and updated as indicated. Interim medical history since our last visit reviewed. Allergies and medications reviewed and updated.  Review of Systems  Constitutional: Negative.   Respiratory: Negative.   Cardiovascular: Negative.   Neurological: Negative.   Psychiatric/Behavioral: Negative.     Per HPI unless specifically indicated above     Objective:    BP 131/86 (BP Location: Left Arm, Patient Position: Sitting, Cuff Size: Large)   Pulse 86   Temp 97.6 F (36.4 C)   Wt 216 lb 6 oz (98.1 kg)   LMP 03/03/2016   SpO2 95%   BMI 34.92 kg/m   Wt Readings from Last 3 Encounters:  08/12/17 216 lb 6 oz (98.1 kg)  07/09/17 214 lb (97.1 kg)  06/03/17 219 lb 4 oz (99.5 kg)    Physical Exam  Constitutional: She is oriented to person, place, and time. She appears well-developed and well-nourished. No distress.  HENT:  Head: Normocephalic and atraumatic.  Right Ear: Hearing normal.  Left Ear: Hearing normal.  Nose: Nose normal.  Eyes: Conjunctivae and lids are normal. Right eye exhibits no discharge. Left eye exhibits no discharge. No scleral icterus.    Cardiovascular: Normal rate, regular rhythm, normal heart sounds and intact distal pulses. Exam reveals no gallop and no friction rub.  No murmur heard. Pulmonary/Chest: Effort normal and breath sounds normal. No respiratory distress. She has no wheezes. She has no rales. She exhibits no tenderness.  Musculoskeletal: Normal range of motion.  Neurological: She is alert and oriented to person, place, and time.  Skin: Skin is warm, dry and intact. No rash noted. She is not diaphoretic. No erythema. No pallor.  Psychiatric: She has a normal mood and affect. Her speech is normal and behavior is normal. Judgment and thought content normal. Cognition and memory are normal.  Nursing note and vitals reviewed.   Results for orders placed or performed in visit on 40/98/11  Basic metabolic panel  Result Value Ref Range   Glucose 97 65 - 99 mg/dL   BUN 25 (H) 6 - 24 mg/dL   Creatinine, Ser 2.04 (H) 0.57 - 1.00 mg/dL   GFR calc non Af Amer 28 (L) >59 mL/min/1.73   GFR calc Af Amer 32 (L) >59 mL/min/1.73   BUN/Creatinine Ratio 12 9 - 23   Sodium 139 134 - 144 mmol/L   Potassium 4.9 3.5 - 5.2 mmol/L   Chloride 104 96 - 106 mmol/L   CO2 22 20 - 29 mmol/L   Calcium 10.0 8.7 - 10.2 mg/dL      Assessment & Plan:   Problem List Items  Addressed This Visit      Endocrine   DM (diabetes mellitus), type 2 with renal complications ([place]) - Primary    Under good control with A1c of 6.9. Continue current regimen. Continue to monitor. Call with any concerns. Refills given.       Relevant Medications   metFORMIN (GLUCOPHAGE-XR) 500 MG 24 hr tablet     Genitourinary   Benign hypertensive renal disease    Under good control. Continue current regimen. Continue to monitor. Call with any concerns.       Relevant Orders   CBC with Differential/Platelet   Comprehensive metabolic panel   CKD (chronic kidney disease) stage 3, GFR 30-59 ml/min (HCC)    Stable. Rechecking levels today. Await results.        Relevant Orders   CBC with Differential/Platelet   Comprehensive metabolic panel     Other   Renal transplant, status post    Rechecking CMP today. Await results. Call with any concerns. Continue to follow with nephrology.      Relevant Orders   CBC with Differential/Platelet   Comprehensive metabolic panel       Follow up plan: Return in about 3 months (around 11/10/2017).

## 2017-08-12 NOTE — Assessment & Plan Note (Signed)
Stable. Rechecking levels today. Await results.

## 2017-08-12 NOTE — Assessment & Plan Note (Signed)
Under good control. Continue current regimen. Continue to monitor. Call with any concerns. 

## 2017-08-12 NOTE — Assessment & Plan Note (Signed)
Under good control with A1c of 6.9. Continue current regimen. Continue to monitor. Call with any concerns. Refills given.  

## 2017-08-12 NOTE — Assessment & Plan Note (Addendum)
Rechecking CMP today. Await results. Call with any concerns. Continue to follow with nephrology.

## 2017-08-13 ENCOUNTER — Encounter: Admit: 2017-08-13 | Discharge: 2017-08-14 | Payer: MEDICARE | Attending: Ophthalmology | Primary: Ophthalmology

## 2017-08-13 ENCOUNTER — Encounter: Payer: Self-pay | Admitting: Family Medicine

## 2017-08-13 DIAGNOSIS — H35033 Hypertensive retinopathy, bilateral: Secondary | ICD-10-CM

## 2017-08-13 DIAGNOSIS — Z940 Kidney transplant status: Principal | ICD-10-CM

## 2017-08-13 DIAGNOSIS — H524 Presbyopia: Secondary | ICD-10-CM

## 2017-08-13 DIAGNOSIS — Z8673 Personal history of transient ischemic attack (TIA), and cerebral infarction without residual deficits: Secondary | ICD-10-CM

## 2017-08-13 DIAGNOSIS — H5203 Hypermetropia, bilateral: Secondary | ICD-10-CM

## 2017-08-13 DIAGNOSIS — H52203 Unspecified astigmatism, bilateral: Secondary | ICD-10-CM

## 2017-08-13 DIAGNOSIS — H2513 Age-related nuclear cataract, bilateral: Secondary | ICD-10-CM

## 2017-08-13 DIAGNOSIS — E119 Type 2 diabetes mellitus without complications: Secondary | ICD-10-CM

## 2017-08-13 LAB — CBC W/ DIFFERENTIAL
BASOPHILS RELATIVE PERCENT: 0 %
EOSINOPHILS ABSOLUTE COUNT: 0.1 10*9/L
EOSINOPHILS RELATIVE PERCENT: 2 %
HEMATOCRIT: 37.2 %
HEMOGLOBIN: 11.6 g/dL
LYMPHOCYTES ABSOLUTE COUNT: 1.9 10*9/L
LYMPHOCYTES RELATIVE PERCENT: 26 %
MEAN CORPUSCULAR HEMOGLOBIN CONC: 31.2 g/dL — ABNORMAL LOW
MEAN CORPUSCULAR HEMOGLOBIN: 28.6 pg
MEAN CORPUSCULAR VOLUME: 92 fL
MONOCYTES ABSOLUTE COUNT: 0.5 10*9/L
MONOCYTES RELATIVE PERCENT: 7 %
NEUTROPHILS ABSOLUTE COUNT: 4.8 10*9/L
NEUTROPHILS RELATIVE PERCENT: 65 %
PLATELET COUNT: 274 10*9/L
RED BLOOD CELL COUNT: 4.05 10*12/L
RED CELL DISTRIBUTION WIDTH: 14 %
WHITE BLOOD CELL COUNT: 7.4 10*9/L

## 2017-08-13 LAB — HEPATIC FUNCTION PANEL
ALKALINE PHOSPHATASE: 39 U/L
PROTEIN TOTAL: 7.3 g/dL

## 2017-08-13 LAB — BASIC METABOLIC PANEL
CALCIUM: 10 mg/dL
CHLORIDE: 101 mmol/L
CO2: 21 mmol/L
CREATININE: 2.1 mg/dL — ABNORMAL HIGH
EGFR MDRD AF AMER: 31 mL/min/{1.73_m2} — ABNORMAL LOW
GLUCOSE RANDOM: 90 mg/dL
POTASSIUM: 5.2 mmol/L
SODIUM: 137 mmol/L

## 2017-08-13 LAB — WBC ADJUSTED: Lab: 0

## 2017-08-13 LAB — ALBUMIN: Lab: 4.4

## 2017-08-13 LAB — ESTIMATED AVERAGE GLUCOSE: Lab: 0

## 2017-08-13 LAB — SODIUM: Lab: 137

## 2017-08-13 LAB — BILIRUBIN TOTAL: Lab: 0

## 2017-08-13 LAB — CBC WITH DIFFERENTIAL/PLATELET
BASOS ABS: 0 10*3/uL (ref 0.0–0.2)
Basos: 0 %
EOS (ABSOLUTE): 0.1 10*3/uL (ref 0.0–0.4)
Eos: 2 %
HEMOGLOBIN: 11.6 g/dL (ref 11.1–15.9)
Hematocrit: 37.2 % (ref 34.0–46.6)
Immature Grans (Abs): 0 10*3/uL (ref 0.0–0.1)
Immature Granulocytes: 0 %
LYMPHS ABS: 1.9 10*3/uL (ref 0.7–3.1)
Lymphs: 26 %
MCH: 28.6 pg (ref 26.6–33.0)
MCHC: 31.2 g/dL — AB (ref 31.5–35.7)
MCV: 92 fL (ref 79–97)
MONOCYTES: 7 %
Monocytes Absolute: 0.5 10*3/uL (ref 0.1–0.9)
NEUTROS ABS: 4.8 10*3/uL (ref 1.4–7.0)
Neutrophils: 65 %
Platelets: 274 10*3/uL (ref 150–379)
RBC: 4.05 x10E6/uL (ref 3.77–5.28)
RDW: 14 % (ref 12.3–15.4)
WBC: 7.4 10*3/uL (ref 3.4–10.8)

## 2017-08-13 LAB — COMPREHENSIVE METABOLIC PANEL
ALT: 10 IU/L (ref 0–32)
AST: 14 IU/L (ref 0–40)
Albumin/Globulin Ratio: 1.5 (ref 1.2–2.2)
Albumin: 4.4 g/dL (ref 3.5–5.5)
Alkaline Phosphatase: 39 IU/L (ref 39–117)
BUN/Creatinine Ratio: 12 (ref 9–23)
BUN: 26 mg/dL — ABNORMAL HIGH (ref 6–24)
Bilirubin Total: <0.2 mg/dL (ref 0.0–1.2)
CO2: 21 mmol/L (ref 20–29)
Calcium: 10 mg/dL (ref 8.7–10.2)
Chloride: 101 mmol/L (ref 96–106)
Creatinine, Ser: 2.1 mg/dL — ABNORMAL HIGH (ref 0.57–1.00)
GFR, EST AFRICAN AMERICAN: 31 mL/min/{1.73_m2} — AB (ref >59)
GFR, EST NON AFRICAN AMERICAN: 27 mL/min/{1.73_m2} — AB (ref >59)
GLUCOSE: 90 mg/dL (ref 65–99)
Globulin, Total: 2.9 g/dL (ref 1.5–4.5)
POTASSIUM: 5.2 mmol/L (ref 3.5–5.2)
SODIUM: 137 mmol/L (ref 134–144)
Total Protein: 7.3 g/dL (ref 6.0–8.5)

## 2017-08-13 LAB — BAYER DCA HB A1C WAIVED: HB A1C (BAYER DCA - WAIVED): 6.9 % (ref <7.0)

## 2017-08-13 NOTE — Unmapped (Signed)
Subjective: Mr./Ms. Nancy Bowman presents with CC of diabetes, here for eye exam.   Chief Complaint   Patient presents with   ??? Diabetic Eye Exam   ??? Complete Eye Exam   .     HPI     Diabetic Eye Exam   Vision is blurred for near.  Diabetes characteristics include Type 2.  Duration of 2 months.  Blood sugar level is controlled.           Comments   Patient is referred by Dr Lisbeth Ply, [facility], for an eye exam, wants new eyeglass prescription.    Lab Results       Component                Value               Date                       A1C                      6.9 (H)             08/06/2017                 Last edited by Marian Sorrow on 08/13/2017  1:58 PM. (History)          Review of Systems: General, Psychological, ENT, Allergy and Immunology, Endocrine, Breast, Respiratory, Cardiovascular, GI, GU, Musculoskeletal, Neurological, and Dermatological all negative. Ophthalmological positive as above.     History:   Past Medical History:   Diagnosis Date   ??? CVA (cerebral vascular accident) (CMS-HCC) 2010   ??? Diabetes mellitus (CMS-HCC)    ??? Difficult intravenous access    ??? Ductal carcinoma in situ (DCIS) of left breast     left 09/2015   ??? ESRD (end stage renal disease) (CMS-HCC)     HD TuThSat   ??? GERD (gastroesophageal reflux disease) 03/24/2014   ??? 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 Hypertension    ??? Seizure (CMS-HCC) none since 2011    secondary to CVA   ??? Seizure disorder (CMS-HCC) 03/24/2014     Past Surgical History:   Procedure Laterality Date   ??? BREAST BIOPSY Left     DCIS   ??? BREAST LUMPECTOMY Left 09/27/2015    DCIS   ??? PR MASTECTOMY, PARTIAL Left 09/24/2015    Procedure: MASTECTOMY, PARTIAL (EG, LUMPECTOMY, TYLECTOMY, QUADRANTECTOMY, SEGMENTECTOMY);  Surgeon: Talbert Cage, DO;  Location: ASC OR [facility];  Service: Surgical Oncology   ??? PR MASTECTOMY, PARTIAL Left 10/25/2015    Procedure: MASTECTOMY, PARTIAL (EG, LUMPECTOMY, TYLECTOMY, QUADRANTECTOMY, SEGMENTECTOMY); Surgeon: Talbert Cage, DO;  Location: MAIN OR [facility];  Service: Surgical Oncology   ??? PR TRANSPLANTATION OF KIDNEY Left 03/24/2014    Procedure: RENAL ALLOTRANSPLANTATION, IMPLANTATION OF GRAFT; WITHOUT RECIPIENT NEPHRECTOMY;  Surgeon: Purcell Mouton, MD;  Location: MAIN OR [facility];  Service: Transplant   ??? RADIATION      ended 02/2016     Family History   Problem Relation Age of Onset   ??? Breast cancer Mother 55   ??? Kidney failure Sister    ??? Heart failure Maternal Grandmother    ??? Diabetes Maternal Grandmother    ??? Diabetes Maternal Grandfather    ??? Stroke Maternal Grandfather    ??? Breast cancer Paternal Grandmother    ??? Stroke Paternal Grandfather    ??? Anesthesia problems  Neg Hx    ??? Colon cancer Neg Hx    ??? Endometrial cancer Neg Hx    ??? Ovarian cancer Neg Hx      Social History     Social History   ??? Marital status: Single     Spouse name: N/A   ??? Number of children: N/A   ??? Years of education: N/A     Occupational History   ??? Not on file.     Social History Main Topics   ??? Smoking status: Former Smoker     Packs/day: 0.50     Years: 26.00     Types: Cigarettes     Quit date: 03/25/2011   ??? Smokeless tobacco: Never Used   ??? Alcohol use 1.2 oz/week     1 Glasses of wine, 1 Shots of liquor per week      Comment: rare   ??? Drug use: No      Comment: quit 2000   ??? Sexual activity: No     Other Topics Concern   ??? Not on file     Social History Narrative   ??? 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 Park / walk / gym.     Medications:   Current Outpatient Prescriptions on File Prior to Visit   Medication Sig Dispense Refill   ??? acetaminophen (TYLENOL) 500 MG tablet Take 1,000 mg by mouth every four (4) hours as needed.      ??? carvedilol (COREG) 12.5 MG tablet Take 1 tablet (12.5 mg total) by mouth Two (2) times a day. 60 tablet 11   ??? docusate sodium (COLACE) 100 MG capsule Take one capsule twice a day as needed 60 capsule 1   ??? fluticasone (FLONASE) 50 mcg/actuation nasal spray Two sprays each nare twice a day. 16 g 5   ??? gabapentin (NEURONTIN) 300 MG capsule Take 1 capsules (300 mg) by mouth in the morning and at noon, 600 mg in the evening. 120 capsule 11   ??? levETIRAcetam (KEPPRA) 500 MG tablet Take 1 tablet (500 mg total) by mouth Two (2) times a day. 60 tablet 11   ??? metFORMIN (GLUCOPHAGE XR) 500 MG 24 hr tablet Take 1 tablet (500 mg total) by mouth daily. 30 tablet 11   ??? miscellaneous medical supply Misc 1 knee brace 1 each 0   ??? MYFORTIC 180 mg EC tablet Take 2 tablets (360 mg total) by mouth Three (3) times a day. 180 tablet 11   ??? omeprazole (PRILOSEC) 20 MG capsule Take 1 capsule (20 mg total) by mouth Two (2) times a day. (Patient taking differently: Take 20 mg by mouth two (2) times a day as needed. ) 60 capsule 5   ??? PROGRAF 1 mg capsule Take  4mg  in the am and 4 mg in the pm. Z94.0 kidney transplant, tx date 03/24/14, DAW1 240 capsule 11   ??? sodium bicarbonate 650 mg tablet Take 2 tablets (1300 mg) three times a day. 120 tablet 11   ??? tamoxifen (NOLVADEX) 20 MG tablet Take 1 tablet (20 mg total) by mouth daily. 90 tablet 4   ??? aspirin 81 MG chewable tablet Chew 1 tablet (81 mg total) daily. 30 tablet 11     Current Facility-Administered Medications on File Prior to Visit   Medication Dose Route Frequency Provider Last Rate Last Dose   ??? lidocaine-EPINEPHrine (XYLOCAINE W/EPI) 1 %-1:100,000 injection  12 mL  12 mL Intradermal Once Talbert Cage, DO           Objective:   Base Eye Exam     Visual Acuity (Snellen - Linear)       Right Left    Dist [place] 20/25 -2 20/25 -2          Tonometry (Tonopen, 2:07 PM)       Right Left    Pressure 17 19          Pupils       Dark Light APD    Right 4 3 None    Left 4 3 None          Visual Fields       Left Right     Full Full          Extraocular Movement       Right Left     Full, Ortho Full, Ortho          Neuro/Psych     Oriented x3:  Yes          Dilation     Both eyes:  1% Tropicamide, 2.5% Phenylephrine @ 2:07 PM Slit Lamp and Fundus Exam     External Exam       Right Left    External Normal Normal          Slit Lamp Exam       Right Left    Lids/Lashes Normal Normal    Conjunctiva/Sclera White and quiet White and quiet    Cornea Clear Clear    Anterior Chamber Deep and quiet Deep and quiet    Iris Round and reactive Round and reactive    Lens 1+ NSC 1+ NSC    Vitreous Normal Normal          Fundus Exam       Right Left    Disc Normal Normal, CW spots / atrophy around disc    C/D Ratio 0.3 0.3    Macula Normal Normal    Vessels Normal Normal    Periphery Normal Normal            Refraction     Manifest Refraction       Sphere Dist VA Add [place]    Right +0.75 20/20 +2.00 J1+    Left +0.75 20/20 +2.00 J1+          Final Rx       Sphere Add    Right +0.75 +2.00    Left +0.75 +2.00                Assessment / Plan:   1. Kidney replaced by transplant    2. Diabetes mellitus type 2 without retinopathy (CMS-HCC)    3. Hypertensive retinopathy of both eyes    4. Hx of arterial ischemic stroke    5. Age-related nuclear cataract of both eyes    6. Hyperopia with astigmatism and presbyopia, bilateral        1. DM2 without Retinopathy  - Kidney transplant  - A1C 6.9  - Continue good dietary / medical management    2. HTN Retinopathy   - Some cotton wool spots / retinal whitening peri-papillary OS > OD  - HTN better controlled now  - Monitor     3. Hx CVA  - 2010 / on life support  - CVF intact    4. Cataract OU   - Mild,  monitor     5. Hyperopia with Astigmatism / Presbyopia  - OTC reading glasses     RTC 1 year    Vergie Living, MD    I agree with resident assessment and plan.     Vergie Living, MD

## 2017-08-16 LAB — VITAMIN D 1,25-DIHYDROXY: 1,25-Dihydroxyvitamin D:MCnc:Pt:Ser/Plas:Qn:: 48

## 2017-08-17 MED FILL — MYFORTIC/180MG/TAB: MYFORTIC/180MG/TAB | 30 days supply | Qty: 180 | Fill #10

## 2017-08-17 MED FILL — PROGRAF/1MG/CAP: PROGRAF/1MG/CAP | 30 days supply | Qty: 240 | Fill #10

## 2017-08-19 DIAGNOSIS — H2513 Age-related nuclear cataract, bilateral: Secondary | ICD-10-CM | POA: Insufficient documentation

## 2017-08-19 DIAGNOSIS — H524 Presbyopia: Secondary | ICD-10-CM

## 2017-08-19 DIAGNOSIS — H52203 Unspecified astigmatism, bilateral: Secondary | ICD-10-CM

## 2017-08-19 DIAGNOSIS — H35033 Hypertensive retinopathy, bilateral: Secondary | ICD-10-CM | POA: Insufficient documentation

## 2017-08-19 DIAGNOSIS — H5203 Hypermetropia, bilateral: Secondary | ICD-10-CM | POA: Insufficient documentation

## 2017-09-08 MED ORDER — PROGRAF 1 MG CAPSULE: capsule | 11 refills | 0 days

## 2017-09-08 MED ORDER — MYFORTIC 180 MG TABLET,DELAYED RELEASE: 360 mg | tablet | Freq: Three times a day (TID) | 11 refills | 0 days | Status: AC

## 2017-09-08 MED ORDER — MYFORTIC 180 MG TABLET,DELAYED RELEASE
ORAL_TABLET | Freq: Three times a day (TID) | ORAL | 11 refills | 0.00000 days | Status: CP
Start: 2017-09-08 — End: 2017-09-08

## 2017-09-08 MED ORDER — PROGRAF 1 MG CAPSULE
ORAL_CAPSULE | ORAL | 11 refills | 0.00000 days | Status: CP
Start: 2017-09-08 — End: 2017-09-08

## 2017-09-08 MED ORDER — PROGRAF 1 MG CAPSULE: capsule | 11 refills | 0 days | Status: AC

## 2017-09-08 MED ORDER — MYFORTIC 180 MG TABLET,DELAYED RELEASE: tablet | 11 refills | 0 days

## 2017-09-08 NOTE — Unmapped (Signed)
[facility] Refill Coordination Note  Specialty Medication(s): Myfortic 180mg  & Prograf 1mg     Ellene Bloodsaw, DOB: 1965/11/27  Phone: [phone] (home) , Alternate phone contact: N/A  Phone or address changes today?: No  All above HIPAA information was verified with patient.  Shipping Address: [address]  [place] [address]   Insurance changes? No    Completed refill call assessment today to schedule patient's medication shipment from the [facility] [phone]).      Confirmed the medication and dosage are correct and have not changed: Yes, regimen is correct and unchanged.    Confirmed patient started or stopped the following medications in the past month:  No, there are no changes reported at this time.    Are you tolerating your medication?:  Dniya reports tolerating the medication.    ADHERENCE    (Below is required for Medicare Part B or Transplant patients only - per drug):   How many tablets were dispensed last month:     Myfortic 180 mg   Quantity filled last month: 180   # of tablets left on hand: 10 DAYS    Prograf 1 mg   Quantity filled last month: 240   # of tablets left on hand: 10 DAYS    Did you miss any doses in the past 4 weeks? No missed doses reported.    FINANCIAL/SHIPPING    Delivery Scheduled: Yes, Expected medication delivery date: 09/15/2017     Keyairra did not have any additional questions at this time.    Delivery address validated in FSI scheduling system: Yes, address listed in FSI is correct.    We will follow up with patient monthly for standard refill processing and delivery.      Thank you,  Tamala Fothergill   [facility] Specialty Technician

## 2017-09-13 MED FILL — PROGRAF/1MG/CAP: PROGRAF/1MG/CAP | 30 days supply | Qty: 240 | Fill #11

## 2017-09-13 MED FILL — MYFORTIC/180MG/TAB: MYFORTIC/180MG/TAB | 30 days supply | Qty: 180 | Fill #11

## 2017-09-21 ENCOUNTER — Telehealth: Payer: Self-pay | Admitting: Family Medicine

## 2017-09-21 NOTE — Telephone Encounter (Signed)
Please get patient scheduled for an appointment

## 2017-09-21 NOTE — Telephone Encounter (Signed)
Copied from [place] [id number]. Topic: Quick Communication - See Telephone Encounter >> Sep 21, 2017 10:32 AM Percell Belt A wrote: CRM for notification. See Telephone encounter for:  pt called in and stated that her back is been hurting her much more lately and would like to know if Dr Wynetta Emery has any other suggestions other then tylenol?    Best number [phone]   09/21/17.

## 2017-09-27 ENCOUNTER — Ambulatory Visit (INDEPENDENT_AMBULATORY_CARE_PROVIDER_SITE_OTHER): Payer: Medicare Other | Admitting: Family Medicine

## 2017-09-27 ENCOUNTER — Encounter: Payer: Self-pay | Admitting: Family Medicine

## 2017-09-27 VITALS — BP 113/75 | HR 81 | Temp 98.0°F | Wt 214.1 lb

## 2017-09-27 DIAGNOSIS — M2540 Effusion, unspecified joint: Secondary | ICD-10-CM

## 2017-09-27 DIAGNOSIS — Z940 Kidney transplant status: Secondary | ICD-10-CM | POA: Diagnosis not present

## 2017-09-27 DIAGNOSIS — M5441 Lumbago with sciatica, right side: Secondary | ICD-10-CM | POA: Diagnosis not present

## 2017-09-27 DIAGNOSIS — M5442 Lumbago with sciatica, left side: Secondary | ICD-10-CM

## 2017-09-27 DIAGNOSIS — J069 Acute upper respiratory infection, unspecified: Secondary | ICD-10-CM

## 2017-09-27 DIAGNOSIS — G8929 Other chronic pain: Secondary | ICD-10-CM | POA: Diagnosis not present

## 2017-09-27 MED ORDER — DICLOFENAC SODIUM 1 % TD GEL: 4.0000 g | Freq: Four times a day (QID) | TRANSDERMAL | 3 refills | Status: DC

## 2017-09-27 NOTE — Assessment & Plan Note (Signed)
Will obtain x-ray. Offered PT and referral for back specialist, both declined. Discussed that we do not do chronic pain management in this clinic. Will start voltaren gel and recheck 1 month. Call with any concerns.

## 2017-09-27 NOTE — Assessment & Plan Note (Signed)
Labs drawn today

## 2017-09-27 NOTE — Progress Notes (Signed)
BP 113/75 (BP Location: Left Arm, Patient Position: Sitting, Cuff Size: Normal)   Pulse 81   Temp 98 F (36.7 C)   Wt 214 lb 1 oz (97.1 kg)   LMP 03/03/2016   SpO2 99%   BMI 34.55 kg/m    Subjective:    Patient ID: [name], female    DOB: Mar 08, 1966, 52 y.o.   MRN: [id number]  HPI: [name] is a 52 y.o. female who presents today earlier than expected for a sick visit.   Chief Complaint  Patient presents with  . Back Pain  . URI  . Joint Swelling    knuckle   UPPER RESPIRATORY TRACT INFECTION Duration: 3 days Worst symptom: congestion Fever: no Cough: yes Shortness of breath: no Wheezing: no Chest pain: no Chest tightness: no Chest congestion: no Nasal congestion: yes Runny nose: yes Post nasal drip: yes Sneezing: no Sore throat: no Swollen glands: no Sinus pressure: no Headache: no Face pain: no Toothache: no Ear pain: yes bilateral Ear pressure: yes bilateral Eyes red/itching:no Eye drainage/crusting: no  Vomiting: no Rash: no Fatigue: yes Sick contacts: yes Strep contacts: no  Context: stable Recurrent sinusitis: no Relief with OTC cold/cough medications: no  Treatments attempted: cold/sinus, mucinex and anti-histamine   Complaining of joint swelling of R 1st knuckle- this is chronic, complained of it around this time last year. Had full work up for rheumatologic diseases including tick diseases about a year ago. Had a fracture of her R index finger in April of last year  BACK PAIN Duration: chronic Mechanism of injury: unknown Location: R>L and low back Onset: gradual Severity: 9/10 Quality: aching and sore Frequency: constant Radiation: L leg above the knee Aggravating factors: lifting, movement, walking and bending Alleviating factors: rest and narcotics Status: worse Treatments attempted: rest  Relief with NSAIDs?: No NSAIDs Taken Nighttime pain:  yes Paresthesias / decreased sensation:  no Bowel / bladder  incontinence:  no Fevers:  no Dysuria / urinary frequency:  no  Relevant past medical, surgical, family and social history reviewed and updated as indicated. Interim medical history since our last visit reviewed. Allergies and medications reviewed and updated.  Review of Systems  Constitutional: Negative.   HENT: Positive for congestion, ear pain, postnasal drip, rhinorrhea, sinus pressure, sinus pain and sore throat. Negative for dental problem, drooling, ear discharge, facial swelling, hearing loss, mouth sores, nosebleeds, sneezing, tinnitus, trouble swallowing and voice change.   Eyes: Negative.   Respiratory: Negative.   Cardiovascular: Negative.   Musculoskeletal: Positive for arthralgias, back pain and myalgias. Negative for gait problem, joint swelling, neck pain and neck stiffness.  Skin: Negative.   Psychiatric/Behavioral: Negative.     Per HPI unless specifically indicated above     Objective:    BP 113/75 (BP Location: Left Arm, Patient Position: Sitting, Cuff Size: Normal)   Pulse 81   Temp 98 F (36.7 C)   Wt 214 lb 1 oz (97.1 kg)   LMP 03/03/2016   SpO2 99%   BMI 34.55 kg/m   Wt Readings from Last 3 Encounters:  09/27/17 214 lb 1 oz (97.1 kg)  08/12/17 216 lb 6 oz (98.1 kg)  07/09/17 214 lb (97.1 kg)    Physical Exam  Constitutional: She is oriented to person, place, and time. She appears well-developed and well-nourished. No distress.  HENT:  Head: Normocephalic and atraumatic.  Right Ear: Hearing and external ear normal.  Left Ear: Hearing and external ear normal.  Nose: Nose  normal.  Mouth/Throat: Oropharynx is clear and moist. No oropharyngeal exudate.  Eyes: Conjunctivae, EOM and lids are normal. Pupils are equal, round, and reactive to light. Right eye exhibits no discharge. Left eye exhibits no discharge. No scleral icterus.  Neck: Normal range of motion. Neck supple. No JVD present. No tracheal deviation present. No thyromegaly present.    Cardiovascular: Normal rate, regular rhythm, normal heart sounds and intact distal pulses. Exam reveals no gallop and no friction rub.  No murmur heard. Pulmonary/Chest: Effort normal and breath sounds normal. No stridor. No respiratory distress. She has no wheezes. She has no rales. She exhibits no tenderness.  Lymphadenopathy:    She has no cervical adenopathy.  Neurological: She is alert and oriented to person, place, and time.  Skin: Skin is warm, dry and intact. No rash noted. She is not diaphoretic. No erythema. No pallor.  Psychiatric: She has a normal mood and affect. Her speech is normal and behavior is normal. Judgment and thought content normal. Cognition and memory are normal.  Nursing note and vitals reviewed. Back Exam:    Inspection:  Normal spinal curvature.  No deformity, ecchymosis, erythema, or lesions     Palpation:     Midline spinal tenderness: no      Paralumbar tenderness: yes Right     Parathoracic tenderness: no      Buttocks tenderness: yesRight     Range of Motion:      Flexion: Fingers to Knees     Extension:Decreased     Lateral bending:Decreased    Rotation:Decreased    Neuro Exam:Lower extremity DTRs normal & symmetric.  Strength and sensation intact.    Special Tests:      Straight leg raise:negative   Results for orders placed or performed in visit on 08/12/17  Bayer DCA Hb A1c Waived  Result Value Ref Range   Bayer DCA Hb A1c Waived 6.9 <7.0 %  CBC with Differential/Platelet  Result Value Ref Range   WBC 7.4 3.4 - 10.8 x10E3/uL   RBC 4.05 3.77 - 5.28 x10E6/uL   Hemoglobin 11.6 11.1 - 15.9 g/dL   Hematocrit 37.2 34.0 - 46.6 %   MCV 92 79 - 97 fL   MCH 28.6 26.6 - 33.0 pg   MCHC 31.2 (L) 31.5 - 35.7 g/dL   RDW 14.0 12.3 - 15.4 %   Platelets 274 150 - 379 x10E3/uL   Neutrophils 65 Not Estab. %   Lymphs 26 Not Estab. %   Monocytes 7 Not Estab. %   Eos 2 Not Estab. %   Basos 0 Not Estab. %   Neutrophils Absolute 4.8 1.4 - 7.0 x10E3/uL    Lymphocytes Absolute 1.9 0.7 - 3.1 x10E3/uL   Monocytes Absolute 0.5 0.1 - 0.9 x10E3/uL   EOS (ABSOLUTE) 0.1 0.0 - 0.4 x10E3/uL   Basophils Absolute 0.0 0.0 - 0.2 x10E3/uL   Immature Granulocytes 0 Not Estab. %   Immature Grans (Abs) 0.0 0.0 - 0.1 x10E3/uL  Comprehensive metabolic panel  Result Value Ref Range   Glucose 90 65 - 99 mg/dL   BUN 26 (H) 6 - 24 mg/dL   Creatinine, Ser 2.10 (H) 0.57 - 1.00 mg/dL   GFR calc non Af Amer 27 (L) >59 mL/min/1.73   GFR calc Af Amer 31 (L) >59 mL/min/1.73   BUN/Creatinine Ratio 12 9 - 23   Sodium 137 134 - 144 mmol/L   Potassium 5.2 3.5 - 5.2 mmol/L   Chloride 101 96 - 106 mmol/L  CO2 21 20 - 29 mmol/L   Calcium 10.0 8.7 - 10.2 mg/dL   Total Protein 7.3 6.0 - 8.5 g/dL   Albumin 4.4 3.5 - 5.5 g/dL   Globulin, Total 2.9 1.5 - 4.5 g/dL   Albumin/Globulin Ratio 1.5 1.2 - 2.2   Bilirubin Total <0.2 0.0 - 1.2 mg/dL   Alkaline Phosphatase 39 39 - 117 IU/L   AST 14 0 - 40 IU/L   ALT 10 0 - 32 IU/L      Assessment & Plan:   Problem List Items Addressed This Visit      Nervous and Auditory   Chronic bilateral low back pain with left-sided sciatica    Will obtain x-ray. Offered PT and referral for back specialist, both declined. Discussed that we do not do chronic pain management in this clinic. Will start voltaren gel and recheck 1 month. Call with any concerns.         Other   Renal transplant, status post    Labs drawn today.       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 Use voltaren, Rx given today. Call with any concerns.    Viral upper respiratory tract infection       No sign of bacterial infection. Continue OTC medications. Call with any concerns.        Follow up plan: Return in about 4 weeks (around 10/25/2017) for follow up back pain.

## 2017-09-27 NOTE — Patient Instructions (Addendum)

## 2017-09-28 ENCOUNTER — Telehealth: Payer: Self-pay | Admitting: Family Medicine

## 2017-09-28 ENCOUNTER — Encounter: Payer: Self-pay | Admitting: Family Medicine

## 2017-09-28 LAB — BASIC METABOLIC PANEL
BUN / CREAT RATIO: 17 (ref 9–23)
BUN: 38 mg/dL — AB (ref 6–24)
CHLORIDE: 102 mmol/L (ref 96–106)
CO2: 18 mmol/L — AB (ref 20–29)
Calcium: 10.7 mg/dL — ABNORMAL HIGH (ref 8.7–10.2)
Creatinine, Ser: 2.25 mg/dL — ABNORMAL HIGH (ref 0.57–1.00)
GFR calc Af Amer: 28 mL/min/{1.73_m2} — ABNORMAL LOW (ref >59)
GFR calc non Af Amer: 25 mL/min/{1.73_m2} — ABNORMAL LOW (ref >59)
GLUCOSE: 139 mg/dL — AB (ref 65–99)
Potassium: 5.1 mmol/L (ref 3.5–5.2)
SODIUM: 136 mmol/L (ref 134–144)

## 2017-09-28 MED ORDER — SODIUM BICARBONATE 650 MG TABLET
ORAL_TABLET | 11 refills | 0 days | Status: CP
Start: 2017-09-28 — End: ?

## 2017-09-28 NOTE — Telephone Encounter (Signed)
error 

## 2017-09-29 LAB — CHLORIDE: Lab: 102

## 2017-09-29 LAB — BASIC METABOLIC PANEL
BLOOD UREA NITROGEN: 38 mg/dL
CALCIUM: 10.7 mg/dL
CHLORIDE: 102 mmol/L
CO2: 18 mmol/L
CREATININE: 2.25 mg/dL
GLUCOSE RANDOM: 139 mg/dL
SODIUM: 136 mmol/L

## 2017-10-04 ENCOUNTER — Telehealth: Payer: Self-pay | Admitting: Family Medicine

## 2017-10-04 MED ORDER — LIDOCAINE VISCOUS 2 % MT SOLN: 5.0000 mL | OROMUCOSAL | 0 refills | Status: DC | PRN

## 2017-10-04 NOTE — Telephone Encounter (Signed)
Patient notified

## 2017-10-04 NOTE — Telephone Encounter (Signed)
Copied from [place] [id number]. Topic: Quick Communication - See Telephone Encounter >> Oct 04, 2017  9:59 AM Cleaster Corin, NT wrote: CRM for notification. See Telephone encounter for:   10/04/17. Pt. Calling to see if Dr. Wynetta Emery can call something in for her sore throat. I could get her in tomorrow at 11am or 1pm but pt. Stated she doesn't get off work until 1:30pm then pt. Also statedshe Did not want to make another appt. Because she was just seen last week. Let pt. Know that I will send note. If something can be called in for pt. Cold she would like it sent to [facility]  [place], [place] - [address] [place] [address] Phone: [phone] Fax: [phone]

## 2017-10-04 NOTE — Telephone Encounter (Signed)
Sent in some viscous lidocaine for her throat, please let her know. Will leave this in Dr. Durenda Age box in case she wants to do anything else with it

## 2017-10-05 NOTE — Unmapped (Signed)
Creatinine from 2/25 was 2.25. Pt. Was been sick with cold symptoms. Will repeat labs this week.

## 2017-10-06 ENCOUNTER — Ambulatory Visit: Payer: Self-pay | Admitting: *Deleted

## 2017-10-06 NOTE — Telephone Encounter (Signed)
Will need an appointment if she calls back- we do not call in antibiotics without patients being seen.

## 2017-10-06 NOTE — Telephone Encounter (Signed)
Pt called with complaints of "sinus drainage in her throat and a headache; this week end and Monday she felt worse, and was unable to get out of bed yesterday; she says she is coughing up "color" ; she hash eadache between her eyes facial down to her nostrils nostrils; she has been taking corcidan and mucinex; nurse triage initiated and recommendations made per protocol to include seeing a physician with in 24 hours; pt requests appointment today after 1330; no available appointments noted today; conference call initiated with  Christian and pt is offered appointment today at 1030; pt declined and disconnected before any other arrangements or recommendations can be made; will route to office for documenation of this encounter.    Reason for Disposition . [1] Sinus pain (not just congestion) AND [2] fever  Answer Assessment - Initial Assessment Questions 1. LOCATION: "Where does it hurt?"      Between eyes down to her nostrils 2. ONSET: "When did the sinus pain start?"  (e.g., hours, days)      Ongoing for a couple of weeks; worsened on 10/01/17  3. SEVERITY: "How bad is the pain?"   (Scale 1-10; mild, moderate or severe)   - MILD (1-3): doesn't interfere with normal activities    - MODERATE (4-7): interferes with normal activities (e.g., work or school) or awakens from sleep   - SEVERE (8-10): excruciating pain and patient unable to do any normal activities       7-8 out of 10 4. RECURRENT SYMPTOM: "Have you ever had sinus problems before?" If so, ask: "When was the last time?" and "What happened that time?"      Yes multiple sinus infections 5. NASAL CONGESTION: "Is the nose blocked?" If so, ask, "Can you open it or must you breathe through the mouth?"     no 6. NASAL DISCHARGE: "Do you have discharge from your nose?" If so ask, "What color?"      7. FEVER: "Do you have a fever?" If so, ask: "What is it, how was it measured, and when did it start?"      98.1 today and 99.9 yesterday, 101.0 on  Monday  8. OTHER SYMPTOMS: "Do you have any other symptoms?" (e.g., sore throat, cough, earache, difficulty breathing)    Yellowish sputum, sore throat, fatigue, chest hurting her throat and neck from coughing 9. PREGNANCY: "Is there any chance you are pregnant?" "When was your last menstrual period?"     no  Protocols used: SINUS PAIN OR CONGESTION-A-AH

## 2017-10-06 NOTE — Unmapped (Signed)
[facility] Refill Coordination Note  Specialty Medication(s): Myfortic 180mg  & Prograf 1mg     Nancy Bowman, DOB: 16-Feb-1966  Phone: [phone] (home) , Alternate phone contact: N/A  Phone or address changes today?: No  All above HIPAA information was verified with patient.  Shipping Address: [address]  [place] [address]   Insurance changes? No    Completed refill call assessment today to schedule patient's medication shipment from the [facility] [phone]).      Confirmed the medication and dosage are correct and have not changed: Yes, regimen is correct and unchanged.    Confirmed patient started or stopped the following medications in the past month:  No, there are no changes reported at this time.    Are you tolerating your medication?:  Nancy Bowman reports tolerating the medication.    ADHERENCE    (Below is required for Medicare Part B or Transplant patients only - per drug):   How many tablets were dispensed last month:     Myfortic 180 mg   Quantity filled last month: 180   # of tablets left on hand: 10 DAYS    Prograf 1 mg   Quantity filled last month: 240   # of tablets left on hand: 10 DAYS    Did you miss any doses in the past 4 weeks? No missed doses reported.    FINANCIAL/SHIPPING    Delivery Scheduled: Yes, Expected medication delivery date: 10/11/2017     The patient will receive an FSI print out for each medication shipped and additional FDA Medication Guides as required.  Patient education from [facility] or Robet Leu may also be included in the shipment    Nancy Bowman did not have any additional questions at this time.    Delivery address validated in FSI scheduling system: Yes, address listed in FSI is correct.    We will follow up with patient monthly for standard refill processing and delivery.      Thank you,  Tamala Fothergill   [facility] Specialty Technician

## 2017-10-07 ENCOUNTER — Other Ambulatory Visit: Payer: Self-pay | Admitting: Family Medicine

## 2017-10-07 ENCOUNTER — Other Ambulatory Visit: Payer: Medicare Other

## 2017-10-07 DIAGNOSIS — D899 Disorder involving the immune mechanism, unspecified: Secondary | ICD-10-CM

## 2017-10-07 DIAGNOSIS — E559 Vitamin D deficiency, unspecified: Secondary | ICD-10-CM | POA: Diagnosis not present

## 2017-10-07 DIAGNOSIS — Z79899 Other long term (current) drug therapy: Secondary | ICD-10-CM

## 2017-10-07 DIAGNOSIS — Z940 Kidney transplant status: Secondary | ICD-10-CM

## 2017-10-07 DIAGNOSIS — E1122 Type 2 diabetes mellitus with diabetic chronic kidney disease: Secondary | ICD-10-CM

## 2017-10-07 DIAGNOSIS — T8610 Unspecified complication of kidney transplant: Secondary | ICD-10-CM

## 2017-10-07 MED ORDER — TRAMADOL 50 MG TABLET
ORAL_TABLET | Freq: Three times a day (TID) | ORAL | 0 refills | 0 days | PRN
Start: 2017-10-07 — End: 2019-01-18

## 2017-10-08 MED FILL — PROGRAF/1MG/CAP: PROGRAF/1MG/CAP | 30 days supply | Qty: 240 | Fill #0

## 2017-10-08 MED FILL — MYFORTIC/180MG/TAB: MYFORTIC/180MG/TAB | 30 days supply | Qty: 180 | Fill #0

## 2017-10-09 LAB — BASIC METABOLIC PANEL
BUN/Creatinine Ratio: 12 (ref 9–23)
BUN: 23 mg/dL (ref 6–24)
CALCIUM: 9.5 mg/dL (ref 8.7–10.2)
CO2: 21 mmol/L (ref 20–29)
CREATININE: 1.85 mg/dL — AB (ref 0.57–1.00)
Chloride: 98 mmol/L (ref 96–106)
GFR calc Af Amer: 36 mL/min/{1.73_m2} — ABNORMAL LOW (ref >59)
GFR calc non Af Amer: 31 mL/min/{1.73_m2} — ABNORMAL LOW (ref >59)
GLUCOSE: 97 mg/dL (ref 65–99)
Potassium: 4.7 mmol/L (ref 3.5–5.2)
SODIUM: 132 mmol/L — AB (ref 134–144)

## 2017-10-09 LAB — CBC WITH DIFFERENTIAL/PLATELET
BASOS: 0 %
Basophils Absolute: 0 10*3/uL (ref 0.0–0.2)
EOS (ABSOLUTE): 0.2 10*3/uL (ref 0.0–0.4)
EOS: 3 %
HEMATOCRIT: 35.7 % (ref 34.0–46.6)
HEMOGLOBIN: 11.3 g/dL (ref 11.1–15.9)
IMMATURE GRANULOCYTES: 0 %
Immature Grans (Abs): 0 10*3/uL (ref 0.0–0.1)
LYMPHS ABS: 1.8 10*3/uL (ref 0.7–3.1)
Lymphs: 23 %
MCH: 28.8 pg (ref 26.6–33.0)
MCHC: 31.7 g/dL (ref 31.5–35.7)
MCV: 91 fL (ref 79–97)
MONOCYTES: 7 %
MONOS ABS: 0.5 10*3/uL (ref 0.1–0.9)
Neutrophils Absolute: 5.1 10*3/uL (ref 1.4–7.0)
Neutrophils: 67 %
Platelets: 233 10*3/uL (ref 150–379)
RBC: 3.93 x10E6/uL (ref 3.77–5.28)
RDW: 13.7 % (ref 12.3–15.4)
WBC: 7.6 10*3/uL (ref 3.4–10.8)

## 2017-10-09 LAB — PHOSPHORUS: Phosphorus: 2.7 mg/dL (ref 2.5–4.5)

## 2017-10-09 LAB — TACROLIMUS LEVEL: Tacrolimus Lvl: 3.1 ng/mL (ref 2.0–20.0)

## 2017-10-09 LAB — MAGNESIUM: Magnesium: 1.4 mg/dL — ABNORMAL LOW (ref 1.6–2.3)

## 2017-10-11 ENCOUNTER — Encounter: Payer: Self-pay | Admitting: Family Medicine

## 2017-10-20 LAB — CBC W/ DIFFERENTIAL
BASOPHILS ABSOLUTE COUNT: 0 10*9/L
BASOPHILS RELATIVE PERCENT: 0 %
EOSINOPHILS RELATIVE PERCENT: 3 %
HEMATOCRIT: 35.7 %
HEMOGLOBIN: 11.3 g/dL
LYMPHOCYTES ABSOLUTE COUNT: 1.8 10*9/L
LYMPHOCYTES RELATIVE PERCENT: 23 %
MEAN CORPUSCULAR HEMOGLOBIN CONC: 31.7 g/dL
MEAN CORPUSCULAR HEMOGLOBIN: 38.8 pg
MEAN CORPUSCULAR VOLUME: 91 fL
MONOCYTES ABSOLUTE COUNT: 0.5 10*9/L
NEUTROPHILS ABSOLUTE COUNT: 5.1 10*9/L
NEUTROPHILS RELATIVE PERCENT: 67 %
PLATELET COUNT: 233 10*9/L
RED BLOOD CELL COUNT: 3.93 10*12/L
RED CELL DISTRIBUTION WIDTH: 13.7 %
WHITE BLOOD CELL COUNT: 7.6 10*9/L

## 2017-10-20 LAB — MAGNESIUM: Lab: 1.4 — ABNORMAL LOW

## 2017-10-20 LAB — COMPREHENSIVE METABOLIC PANEL
BLOOD UREA NITROGEN: 23 mg/dL
CALCIUM: 9.5 mg/dL
CHLORIDE: 98 mmol/L
CO2: 21 mmol/L
EGFR MDRD AF AMER: 36 mL/min/{1.73_m2} — ABNORMAL LOW
EGFR MDRD NON AF AMER: 31 mL/min/{1.73_m2} — ABNORMAL LOW
GLUCOSE RANDOM: 97 mg/dL
POTASSIUM: 4.7 mmol/L
SODIUM: 132 mmol/L — ABNORMAL LOW

## 2017-10-20 LAB — GLUCOSE RANDOM: Lab: 97

## 2017-10-20 LAB — MEAN CORPUSCULAR HEMOGLOBIN CONC: Lab: 31.7

## 2017-10-20 LAB — PHOSPHORUS: Lab: 2.7

## 2017-10-20 LAB — TACROLIMUS, TROUGH: Lab: 3.1

## 2017-10-28 ENCOUNTER — Ambulatory Visit: Payer: Medicare Other | Admitting: Family Medicine

## 2017-11-02 NOTE — Unmapped (Signed)
[facility] Refill Coordination Note  Specialty Medication(s): Myfortic 180mg  & Prograf 1mg     Nancy Bowman, DOB: 28-Aug-1965  Phone: [phone] (home) , Alternate phone contact: N/A  Phone or address changes today?: No  All above HIPAA information was verified with patient.  Shipping Address: [address]  [place] [address]   Insurance changes? No    Completed refill call assessment today to schedule patient's medication shipment from the [facility] [phone]).      Confirmed the medication and dosage are correct and have not changed: Yes, regimen is correct and unchanged.    Confirmed patient started or stopped the following medications in the past month:  No, there are no changes reported at this time.    Are you tolerating your medication?:  Valmai reports tolerating the medication.    ADHERENCE    (Below is required for Medicare Part B or Transplant patients only - per drug):   How many tablets were dispensed last month:     Prograf 1 mg   Quantity filled last month: 240   # of tablets left on hand: 8 DAYS    Myfortic 180 mg   Quantity filled last month: 180   # of tablets left on hand: 8 DAYS    Did you miss any doses in the past 4 weeks? No missed doses reported.    FINANCIAL/SHIPPING    Delivery Scheduled: Yes, Expected medication delivery date: 11/09/2017     The patient will receive an FSI print out for each medication shipped and additional FDA Medication Guides as required.  Patient education from [place] or [employer] Leu may also be included in the shipment    Jarrah did not have any additional questions at this time.    Delivery address validated in FSI scheduling system: Yes, address listed in FSI is correct.    We will follow up with patient monthly for standard refill processing and delivery.      Thank you,  Tamala Fothergill   [facility] Specialty Technician

## 2017-11-08 MED FILL — PROGRAF/1MG/CAP: PROGRAF/1MG/CAP | 30 days supply | Qty: 240 | Fill #1

## 2017-11-08 MED FILL — MYFORTIC/180MG/TAB: MYFORTIC/180MG/TAB | 30 days supply | Qty: 180 | Fill #1

## 2017-11-11 ENCOUNTER — Ambulatory Visit: Payer: Medicare Other | Admitting: Family Medicine

## 2017-11-24 NOTE — Unmapped (Signed)
Nancy Bowman 52 y.o. female patient presents to clinic today for evaluation.  Pt ID verified. Pt is s/p kidney transplant . Pt takes clindamycin premed per her nephrologist prior to all procedures says patient.    Patient Active Problem List   Diagnosis   ??? Hypertension   ??? ESRD (end stage renal disease) (CMS-HCC)   ??? GERD (gastroesophageal reflux disease)   ??? Seizure disorder (CMS-HCC)   ??? 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 Kidney transplanted   ??? Ductal carcinoma in situ (DCIS) of left breast   ??? Diabetes mellitus type 2 without retinopathy (CMS-HCC)   ??? Hypertensive retinopathy of both eyes   ??? Hx of arterial ischemic stroke   ??? Age-related nuclear cataract of both eyes   ??? Hyperopia with astigmatism and presbyopia, bilateral       Past Medical History:   Diagnosis Date   ??? CVA (cerebral vascular accident) (CMS-HCC) 2010   ??? Diabetes mellitus (CMS-HCC)    ??? Difficult intravenous access    ??? Ductal carcinoma in situ (DCIS) of left breast     left 09/2015   ??? ESRD (end stage renal disease) (CMS-HCC)     HD TuThSat   ??? GERD (gastroesophageal reflux disease) 03/24/2014   ??? 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 Hypertension    ??? Seizure (CMS-HCC) none since 2011    secondary to CVA   ??? Seizure disorder (CMS-HCC) 03/24/2014       Current Outpatient Medications on File Prior to Visit   Medication Sig Dispense Refill   ??? acetaminophen (TYLENOL) 500 MG tablet Take 1,000 mg by mouth every four (4) hours as needed.      ??? aspirin 81 MG chewable tablet Chew 1 tablet (81 mg total) daily. 30 tablet 11   ??? carvedilol (COREG) 12.5 MG tablet Take 1 tablet (12.5 mg total) by mouth Two (2) times a day. 60 tablet 11   ??? [EXPIRED] docusate sodium (COLACE) 100 MG capsule Take one capsule twice a day as needed 60 capsule 1   ??? fluticasone (FLONASE) 50 mcg/actuation nasal spray Two sprays each nare twice a day. 16 g 5   ??? gabapentin (NEURONTIN) 300 MG capsule Take 1 capsules (300 mg) by mouth in the morning and at noon, 600 mg in the evening. 120 capsule 11   ??? levETIRAcetam (KEPPRA) 500 MG tablet Take 1 tablet (500 mg total) by mouth Two (2) times a day. 60 tablet 11   ??? miscellaneous medical supply Misc 1 knee brace 1 each 0   ??? omeprazole (PRILOSEC) 20 MG capsule Take 1 capsule (20 mg total) by mouth Two (2) times a day. (Patient taking differently: Take 20 mg by mouth two (2) times a day as needed. ) 60 capsule 5   ??? tamoxifen (NOLVADEX) 20 MG tablet Take 1 tablet (20 mg total) by mouth daily. 90 tablet 4     Current Facility-Administered Medications on File Prior to Visit   Medication Dose Route Frequency Provider Last Rate Last Dose   ??? lidocaine-EPINEPHrine (XYLOCAINE W/EPI) 1 %-1:100,000 injection 12 mL  12 mL Intradermal Once Talbert Cage, DO           Allergies   Allergen Reactions   ??? 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 Ibuprofen      Can't take due to kidneys   ??? Penicillins      Patient is unsure if she's allergic,  was told she had a reaction as a child but thinks she's taking a cillin while sick without having a reaction.  Pt states she has taken amoxicillin in the past and did not have problem       Dental Exam   Nancy Bowman presents to the clinic for exam.  Reviewed chief complaint, medical history, dental history, allergies, and medications prior to examination.    CC: Patient want her teeth fixed and said that her medical insurance would pay for dental treatment    Vitals:   Vitals:    08/06/17 0934   BP: 135/96   Pulse: 76   SpO2: 97%       Radiographs: Patient presented with radiographs from another dental office that was not very diagnostic    Extraoral exam: Head size, head shape, facial symmetry, skin, complexion, pigmentation, conjunctiva, oral labia, parotid gland, submandibular gland, thyroid, cervical and preauricular lymph nodes, submandibular and submental lymph nodes, tonsilar and occipital lymph nodes, and supraclavicular lymph nodes WNL.  No swelling or lymphadenopathy.     Intraoral exam: lips, buccal mucosa, vestibules and frenula, gingiva and ridges, hard and soft palate, oropharynx and tonsils, salivary glands, tongue, FOM, and submandibular area without mass or lesion.     Plan: patient would like to reschedule until she is able to get her insurance information sorted out      NV: NPE

## 2017-11-26 ENCOUNTER — Encounter: Payer: Self-pay | Admitting: *Deleted

## 2017-11-30 NOTE — Unmapped (Signed)
[facility] Refill and Clinical Coordination Note  Medication(s): MYFORTIC 180, PROGRAF 1    Lamira Deonzella Mordecai, DOB: 1966-03-05  Phone: [phone] (home) , Alternate phone contact: N/A  Shipping address: [address]  [place] [address]  Phone or address changes today?: No  All above HIPAA information verified.  Insurance changes? No    Completed refill and clinical call assessment today to schedule patient's medication shipment from the [facility] [phone]).      MEDICATION RECONCILIATION    Confirmed the medication and dosage are correct and have not changed: Yes, regimen is correct and unchanged.    Were there any changes to your medication(s) in the past month:  No, there are no changes reported at this time.    ADHERENCE    Is this medicine transplant or covered by Medicare Part B? Yes.    Prograf 1 mg   Quantity filled last month: 240   # of tablets left on hand: 7 DAYS LEFT      Myfortic 180 mg   Quantity filled last month: 180   # of tablets left on hand: 7 DAYS LEFT      Did you miss any doses in the past 4 weeks? No missed doses reported.  Adherence counseling provided? Not needed     SIDE EFFECT MANAGEMENT    Are you tolerating your medication?:  Mida reports tolerating the medication.  Side effect management discussed: None      Therapy is appropriate and should be continued.    Evidence of clinical benefit: See Epic note from 08/06/17      FINANCIAL/SHIPPING    Delivery Scheduled: Yes, Expected medication delivery date: 12/03/17   Additional medications refilled: No additional medications/refills needed at this time.    The patient will receive an FSI print out for each medication shipped and additional FDA Medication Guides as required.  Patient education from [place] or [employer]et Leu may also be included in the shipment.    Frayda did not have any additional questions at this time.    Delivery address validated in FSI scheduling system: Yes, address listed above is correct.      We will follow up with patient monthly for standard refill processing and delivery.      Thank you,  Thad Ranger   [facility] Specialty Pharmacist

## 2017-12-02 MED FILL — MYFORTIC/180MG/TAB: MYFORTIC/180MG/TAB | 30 days supply | Qty: 180 | Fill #2

## 2017-12-02 MED FILL — PROGRAF/1MG/CAP: PROGRAF/1MG/CAP | 30 days supply | Qty: 240 | Fill #2

## 2017-12-06 ENCOUNTER — Other Ambulatory Visit: Payer: Self-pay | Admitting: Family Medicine

## 2017-12-10 NOTE — Unmapped (Signed)
No show  This encounter was created in error - please disregard.

## 2017-12-15 MED ORDER — GABAPENTIN 300 MG CAPSULE
ORAL_CAPSULE | 11 refills | 0 days | Status: CP
Start: 2017-12-15 — End: ?

## 2017-12-23 NOTE — Unmapped (Signed)
[facility] Refill Coordination Note    Specialty Medication(s) to be Shipped:   Transplant: Myfortic 180mg  and Prograf 1mg     Other medication(s) to be shipped:      Nancy Bowman, DOB: 11/25/1965  Phone: [phone] (home)   Shipping Address: [address]  [place] [address]    All above HIPAA information was verified with patient.     Completed refill call assessment today to schedule patient's medication shipment from the [facility] [phone]).       Specialty medication(s) and dose(s) confirmed: Regimen is correct and unchanged.   Changes to medications: Nancy Bowman reports no changes reported at this time.  Changes to insurance: No  Questions for the pharmacist: No    The patient will receive an FSI print out for each medication shipped and additional FDA Medication Guides as required.  Patient education from [place] or [employer] Leu may also be included in the shipment.    DISEASE-SPECIFIC INFORMATION        N/A    ADHERENCE              MEDICARE PART B DOCUMENTATION         SHIPPING     Shipping address confirmed in FSI.     Delivery Scheduled: Yes, Expected medication delivery date: [id number] via UPS or courier.     Antonietta Barcelona   [facility] Specialty Technician

## 2017-12-30 MED FILL — PROGRAF/1MG/CAP: PROGRAF/1MG/CAP | 30 days supply | Qty: 240 | Fill #3

## 2017-12-30 MED FILL — MYFORTIC/180MG/TAB: MYFORTIC/180MG/TAB | 30 days supply | Qty: 180 | Fill #3

## 2018-01-14 ENCOUNTER — Encounter: Admit: 2018-01-14 | Discharge: 2018-01-14 | Payer: MEDICARE

## 2018-01-14 DIAGNOSIS — R928 Other abnormal and inconclusive findings on diagnostic imaging of breast: Principal | ICD-10-CM

## 2018-01-14 DIAGNOSIS — D0512 Intraductal carcinoma in situ of left breast: Principal | ICD-10-CM

## 2018-01-14 LAB — HM MAMMOGRAPHY

## 2018-01-14 NOTE — Unmapped (Signed)
Patient Name: Nancy Bowman  Patient Age: 52 y.o.  Encounter Date: 01/14/2018    Referring Physician:   Unknown Per Patient Referring  No address on file    Primary Care Provider:  Dorcas Carrow, DO    Supervising Physician:  Dr. Debbrah Alar    Diagnosis:  1. Ductal carcinoma in situ (DCIS) of left breast      Cancer Staging  Ductal carcinoma in situ (DCIS) of left breast  Staging form: Breast, AJCC 7th Edition  - Pathologic stage from 10/03/2014: Stage 0 (Tis (DCIS), N0, cM0) - Signed by Talbert Cage, DO on 11/05/2015  - Clinical stage from 09/12/2015: Stage 0 (Tis (DCIS), N0, M0) - Signed by Talbert Cage, DO on 09/12/2015      Follow Up Note:    Nancy Bowman is a 52 y.o. female who is seen here for routine follow-up.  Patient is well-known to our service with a history of left breast DCIS diagnosed March 2016 status post breast conserving therapy.  Since her last visit she is doing well without any changes on her breast exam.  Past medical history is updated and reviewed and unchanged.  The patient is on tamoxifen 20 mg daily for chemoprevention    Review of Systems:  Unremarkable for constitutional complaints in all 10 systems    Changes in self breast exam:   None per patient    PMH:   Past Medical History:   Diagnosis Date   ??? CVA (cerebral vascular accident) (CMS-HCC) 2010   ??? Diabetes mellitus (CMS-HCC)    ??? Difficult intravenous access    ??? Ductal carcinoma in situ (DCIS) of left breast     left 09/2015   ??? ESRD (end stage renal disease) (CMS-HCC)     HD TuThSat   ??? GERD (gastroesophageal reflux disease) 03/24/2014   ??? 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 Hypertension    ??? Seizure (CMS-HCC) none since 2011    secondary to CVA   ??? Seizure disorder (CMS-HCC) 03/24/2014       PSH:  Past Surgical History:   Procedure Laterality Date   ??? BREAST BIOPSY Left     DCIS-2017   ??? BREAST LUMPECTOMY Left 09/27/2015    DCIS   ??? PR MASTECTOMY, PARTIAL Left 09/24/2015    Procedure: MASTECTOMY, PARTIAL (EG, LUMPECTOMY, TYLECTOMY, QUADRANTECTOMY, SEGMENTECTOMY);  Surgeon: Talbert Cage, DO;  Location: ASC OR [facility];  Service: Surgical Oncology   ??? PR MASTECTOMY, PARTIAL Left 10/25/2015    Procedure: MASTECTOMY, PARTIAL (EG, LUMPECTOMY, TYLECTOMY, QUADRANTECTOMY, SEGMENTECTOMY);  Surgeon: Talbert Cage, DO;  Location: MAIN OR [facility];  Service: Surgical Oncology   ??? PR TRANSPLANTATION OF KIDNEY Left 03/24/2014    Procedure: RENAL ALLOTRANSPLANTATION, IMPLANTATION OF GRAFT; WITHOUT RECIPIENT NEPHRECTOMY;  Surgeon: Purcell Mouton, MD;  Location: MAIN OR [facility];  Service: Transplant   ??? RADIATION      ended 02/2016       Family History:  Family History   Problem Relation Age of Onset   ??? Breast cancer Mother 92   ??? Kidney failure Sister    ??? Heart failure Maternal Grandmother    ??? Diabetes Maternal Grandmother    ??? Diabetes Maternal Grandfather    ??? Stroke Maternal Grandfather    ??? Cancer Paternal Grandmother    ??? Stroke Paternal Grandfather    ??? Anesthesia problems Neg Hx    ??? Colon cancer Neg Hx    ??? Endometrial  cancer Neg Hx    ??? Ovarian cancer Neg Hx        Exam:  BSA: 2.04 meters squared  BP 146/87  - Pulse 77  - Temp 37.4 ??C (99.3 ??F) (Oral)  - Resp 16  - Ht 160 cm (5' 2.99)  - Wt 93.4 kg (205 lb 12.8 oz)  - LMP 09/04/2015  - BMI 36.47 kg/m??   Pain Assessment: Scale 0    General Appearance:  No acute distress, well appearing and well nourished. A&0 x 3.   HEENT:  Conjuctiva and lids appear normal. Pupils equal and round,   sclera anicteric. Neck is supple. Trachea midline.  No JVD or supraclavicular adenopathy.    Breast:  Rest exam has no skin change mass or nipple discharge bilaterally   Axilla:  No adenopathy in the axilla bilaterally   Pulmonary:    Normal respiratory effort.  Lungs were clear to auscultation  bilaterally.   Cardiovascular:  Regular rate and rhythm, no murmur noted.  No thrill noted.   Neurologic:  Lymphatic: No motor abnormalities noted.  Sensation grossly intact.  No cervical or supraclavicular LAD noted.     Diagnostic Studies:  @MAMMOFINDINGS @  No radiological evidence for malignancy follow-up in a year    Assessment:  Left breast DCIS status post breast conserving therapy from March 2016: No evidence of disease    Plan:  Patient is doing well at this interval check.  Clinical exam and imaging studies are stable.  She is tolerating tamoxifen without any adverse effects.  I will see her in a year with imaging studies sooner should she have any new problems              The note was transcribed with Dragon and therefore may have errors in spelling

## 2018-01-20 ENCOUNTER — Other Ambulatory Visit: Payer: Self-pay

## 2018-01-20 ENCOUNTER — Ambulatory Visit: Payer: Self-pay

## 2018-01-20 ENCOUNTER — Encounter: Payer: Self-pay | Admitting: Intensive Care

## 2018-01-20 ENCOUNTER — Telehealth: Payer: Self-pay | Admitting: Physician Assistant

## 2018-01-20 ENCOUNTER — Other Ambulatory Visit
Admission: RE | Admit: 2018-01-20 | Discharge: 2018-01-20 | Disposition: A | Discharge status: Home / Self Care | Payer: Medicare Other | Source: Ambulatory Visit | Attending: Physician Assistant | Admitting: Physician Assistant

## 2018-01-20 ENCOUNTER — Emergency Department: Payer: Medicare Other

## 2018-01-20 ENCOUNTER — Emergency Department
Admission: EM | Admit: 2018-01-20 | Discharge: 2018-01-20 | Disposition: A | Discharge status: Home / Self Care | Payer: Medicare Other | Attending: Emergency Medicine | Admitting: Emergency Medicine

## 2018-01-20 ENCOUNTER — Encounter: Payer: Self-pay | Admitting: Physician Assistant

## 2018-01-20 ENCOUNTER — Ambulatory Visit (INDEPENDENT_AMBULATORY_CARE_PROVIDER_SITE_OTHER): Payer: Medicare Other | Admitting: Physician Assistant

## 2018-01-20 VITALS — BP 120/70 | HR 86 | Temp 98.2°F | Wt 207.0 lb

## 2018-01-20 DIAGNOSIS — E1122 Type 2 diabetes mellitus with diabetic chronic kidney disease: Secondary | ICD-10-CM

## 2018-01-20 DIAGNOSIS — N1330 Unspecified hydronephrosis: Secondary | ICD-10-CM | POA: Diagnosis not present

## 2018-01-20 DIAGNOSIS — N289 Disorder of kidney and ureter, unspecified: Secondary | ICD-10-CM | POA: Insufficient documentation

## 2018-01-20 DIAGNOSIS — N183 Chronic kidney disease, stage 3 unspecified: Secondary | ICD-10-CM

## 2018-01-20 DIAGNOSIS — Z992 Dependence on renal dialysis: Secondary | ICD-10-CM | POA: Diagnosis not present

## 2018-01-20 DIAGNOSIS — R3912 Poor urinary stream: Secondary | ICD-10-CM | POA: Diagnosis not present

## 2018-01-20 DIAGNOSIS — R5383 Other fatigue: Secondary | ICD-10-CM

## 2018-01-20 DIAGNOSIS — Z79899 Other long term (current) drug therapy: Secondary | ICD-10-CM | POA: Diagnosis not present

## 2018-01-20 DIAGNOSIS — I120 Hypertensive chronic kidney disease with stage 5 chronic kidney disease or end stage renal disease: Secondary | ICD-10-CM | POA: Insufficient documentation

## 2018-01-20 DIAGNOSIS — R079 Chest pain, unspecified: Secondary | ICD-10-CM

## 2018-01-20 DIAGNOSIS — N186 End stage renal disease: Secondary | ICD-10-CM | POA: Insufficient documentation

## 2018-01-20 DIAGNOSIS — Z87891 Personal history of nicotine dependence: Secondary | ICD-10-CM | POA: Diagnosis not present

## 2018-01-20 DIAGNOSIS — Z7982 Long term (current) use of aspirin: Secondary | ICD-10-CM | POA: Insufficient documentation

## 2018-01-20 DIAGNOSIS — Z940 Kidney transplant status: Secondary | ICD-10-CM

## 2018-01-20 DIAGNOSIS — R531 Weakness: Secondary | ICD-10-CM | POA: Insufficient documentation

## 2018-01-20 DIAGNOSIS — Z7984 Long term (current) use of oral hypoglycemic drugs: Secondary | ICD-10-CM | POA: Diagnosis not present

## 2018-01-20 LAB — URINALYSIS, COMPLETE (UACMP) WITH MICROSCOPIC
BACTERIA UA: NONE SEEN
BILIRUBIN URINE: NEGATIVE
GLUCOSE, UA: NEGATIVE mg/dL
KETONES UR: NEGATIVE mg/dL
LEUKOCYTES UA: NEGATIVE
NITRITE: NEGATIVE
PH: 5 (ref 5.0–8.0)
PROTEIN: NEGATIVE mg/dL
Specific Gravity, Urine: 1.013 (ref 1.005–1.030)

## 2018-01-20 LAB — CBC WITH DIFFERENTIAL/PLATELET
BASOS ABS: 0.1 10*3/uL (ref 0–0.1)
BASOS PCT: 1 %
Basophils Absolute: 0.1 10*3/uL (ref 0–0.1)
Basophils Relative: 1 %
Eosinophils Absolute: 0.1 10*3/uL (ref 0–0.7)
Eosinophils Absolute: 0.1 10*3/uL (ref 0–0.7)
Eosinophils Relative: 1 %
Eosinophils Relative: 2 %
HCT: 34.5 % — ABNORMAL LOW (ref 35.0–47.0)
HCT: 37.1 % (ref 35.0–47.0)
HEMOGLOBIN: 12.4 g/dL (ref 12.0–16.0)
Hemoglobin: 12 g/dL (ref 12.0–16.0)
Lymphocytes Relative: 22 %
Lymphocytes Relative: 25 %
Lymphs Abs: 1.6 10*3/uL (ref 1.0–3.6)
Lymphs Abs: 1.8 10*3/uL (ref 1.0–3.6)
MCH: 32.5 pg (ref 26.0–34.0)
MCH: 31.1 pg (ref 26.0–34.0)
MCHC: 34.9 g/dL (ref 32.0–36.0)
MCHC: 33.4 g/dL (ref 32.0–36.0)
MCV: 93.1 fL (ref 80.0–100.0)
MCV: 93.1 fL (ref 80.0–100.0)
MONOS PCT: 8 %
Monocytes Absolute: 0.5 10*3/uL (ref 0.2–0.9)
Monocytes Absolute: 0.6 10*3/uL (ref 0.2–0.9)
Monocytes Relative: 8 %
NEUTROS ABS: 4.7 10*3/uL (ref 1.4–6.5)
NEUTROS PCT: 64 %
Neutro Abs: 4.9 10*3/uL (ref 1.4–6.5)
Neutrophils Relative %: 68 %
Platelets: 216 10*3/uL (ref 150–440)
Platelets: 212 10*3/uL (ref 150–440)
RBC: 3.71 MIL/uL — ABNORMAL LOW (ref 3.80–5.20)
RBC: 3.99 MIL/uL (ref 3.80–5.20)
RDW: 14.6 % — ABNORMAL HIGH (ref 11.5–14.5)
RDW: 14.8 % — ABNORMAL HIGH (ref 11.5–14.5)
WBC: 7.1 10*3/uL (ref 3.6–11.0)
WBC: 7.3 10*3/uL (ref 3.6–11.0)

## 2018-01-20 LAB — BASIC METABOLIC PANEL
Anion gap: 9 (ref 5–15)
BUN: 39 mg/dL — AB (ref 6–20)
CHLORIDE: 104 mmol/L (ref 101–111)
CO2: 19 mmol/L — ABNORMAL LOW (ref 22–32)
CREATININE: 2.76 mg/dL — AB (ref 0.44–1.00)
Calcium: 9.4 mg/dL (ref 8.9–10.3)
GFR calc Af Amer: 22 mL/min — ABNORMAL LOW (ref >60)
GFR, EST NON AFRICAN AMERICAN: 19 mL/min — AB (ref >60)
Glucose, Bld: 167 mg/dL — ABNORMAL HIGH (ref 65–99)
Potassium: 4.7 mmol/L (ref 3.5–5.1)
SODIUM: 132 mmol/L — AB (ref 135–145)

## 2018-01-20 LAB — COMPREHENSIVE METABOLIC PANEL
ALT: 13 U/L — ABNORMAL LOW (ref 14–54)
ANION GAP: 9 (ref 5–15)
AST: 20 U/L (ref 15–41)
Albumin: 4.3 g/dL (ref 3.5–5.0)
Alkaline Phosphatase: 31 U/L — ABNORMAL LOW (ref 38–126)
BUN: 40 mg/dL — ABNORMAL HIGH (ref 6–20)
CALCIUM: 9.8 mg/dL (ref 8.9–10.3)
CO2: 20 mmol/L — AB (ref 22–32)
Chloride: 104 mmol/L (ref 101–111)
Creatinine, Ser: 2.64 mg/dL — ABNORMAL HIGH (ref 0.44–1.00)
GFR calc non Af Amer: 20 mL/min — ABNORMAL LOW (ref >60)
GFR, EST AFRICAN AMERICAN: 23 mL/min — AB (ref >60)
Glucose, Bld: 125 mg/dL — ABNORMAL HIGH (ref 65–99)
Potassium: 4.9 mmol/L (ref 3.5–5.1)
SODIUM: 133 mmol/L — AB (ref 135–145)
Total Bilirubin: 0.3 mg/dL (ref 0.3–1.2)
Total Protein: 7.8 g/dL (ref 6.5–8.1)

## 2018-01-20 LAB — MAGNESIUM: Magnesium: 1.9 mg/dL (ref 1.7–2.4)

## 2018-01-20 LAB — PHOSPHORUS: Phosphorus: 2.8 mg/dL (ref 2.5–4.6)

## 2018-01-20 MED ORDER — SODIUM CHLORIDE 0.9 % IV BOLUS
1000.0000 mL | Freq: Once | INTRAVENOUS | Status: AC
Start: 2018-01-20 — End: 2018-01-20
  Administered 2018-01-20: 1000 mL via INTRAVENOUS

## 2018-01-20 NOTE — Patient Instructions (Addendum)
[facility] in [place]. Go to the medical mall - ask for phlebotomy or lab draw.    Chronic Kidney Disease, Adult Chronic kidney disease (CKD) happens when the kidneys are damaged during a time of 3 or more months. The kidneys are two organs that do many important jobs in the body. These jobs include:  Removing wastes and extra fluids from the blood.  Making hormones that maintain the amount of fluid in your tissues and blood vessels.  Making sure that the body has the right amount of fluids and chemicals.  Most of the time, this condition does not go away, but it can usually be controlled. Steps must be taken to slow down the kidney damage or stop it from getting worse. Otherwise, the kidneys may stop working. Follow these instructions at home:  Follow your diet as told by your doctor. You may need to avoid alcohol, salty foods (sodium), and foods that are high in potassium, calcium, and protein.  Take over-the-counter and prescription medicines only as told by your doctor. Do not take any new medicines unless your doctor says you can do that. These include vitamins and minerals. ? Medicines and nutritional supplements can make kidney damage worse. ? Your doctor may need to change how much medicine you take.  Do not use any tobacco products. These include cigarettes, chewing tobacco, and e-cigarettes. If you need help quitting, ask your doctor.  Keep all follow-up visits as told by your doctor. This is important.  Check your blood pressure. Tell your doctor if there are changes to your blood pressure.  Get to a healthy weight. Stay at that weight. If you need help with this, ask your doctor.  Start or continue an exercise plan. Try to exercise at least 30 minutes a day, 5 days a week.  Stay up-to-date with your shots (immunizations) as told by your doctor. Contact a doctor if:  Your symptoms get worse.  You have new symptoms. Get help right away if:  You have  symptoms of end-stage kidney disease. These include: ? Headaches. ? Skin that is darker or lighter than normal. ? Numbness in your hands or feet. ? Easy bruising. ? Having hiccups often. ? Chest pain. ? Shortness of breath. ? Stopping of menstrual periods in women.  You have a fever.  You are making very little pee (urine).  You have pain or bleeding when you pee (urinate). This information is not intended to replace advice given to you by your health care provider. Make sure you discuss any questions you have with your health care provider. Document Released: 10/14/2009 Document Revised: 12/26/2015 Document Reviewed: 03/18/2012 Elsevier Interactive Patient Education  2017 [employer].

## 2018-01-20 NOTE — ED Notes (Signed)
Pt states she has felt weak since Tuesday. She went to doctor today and they sent her to ER for abnormal labs. Pt alert and oriented x 4.

## 2018-01-20 NOTE — Progress Notes (Signed)
Subjective:    Patient ID: [name], female    DOB: 06/14/1966, 52 y.o.   MRN: [id number]  [name] is a 52 y.o. female presenting on 01/20/2018 for Nausea (Patient complains of nausea, hot flashes, fatigue/weakness since Tuesday)   HPI   History of ESRD, status post kidney transplant in Aug 2015. Currently on tacrolimus. Last Creatinine 10/2017 was 1.8. Usually gets monthly labs but hasn't gotten these. Has Type II DM, last a1c <7. Describes weakness, fatigue.   Social History   Tobacco Use  . Smoking status: Former Smoker    Last attempt to quit: 11/25/2008    Years since quitting: 9.1  . Smokeless tobacco: Never Used  Substance Use Topics  . Alcohol use: Yes    Alcohol/week: 1.2 oz    Types: 2 Glasses of wine per week  . Drug use: No    Types: "Crack" cocaine    Comment: quit in 2003    Review of Systems Per HPI unless specifically indicated above     Objective:    BP 120/70   Pulse 86   Temp 98.2 F (36.8 C) (Oral)   Wt 207 lb (93.9 kg)   LMP 03/03/2016   SpO2 97%   BMI 33.41 kg/m   Wt Readings from Last 3 Encounters:  01/20/18 207 lb (93.9 kg)  01/20/18 207 lb (93.9 kg)  09/27/17 214 lb 1 oz (97.1 kg)    Physical Exam  Constitutional: She is oriented to person, place, and time. She appears well-developed and well-nourished.  Cardiovascular: Normal rate and regular rhythm.  Pulmonary/Chest: Effort normal and breath sounds normal.  Abdominal: Soft. Bowel sounds are normal.  Neurological: She is alert and oriented to person, place, and time.  Skin: Skin is warm and dry.  Psychiatric: She has a normal mood and affect. Her behavior is normal.   Results for orders placed or performed in visit on 10/07/17  Tacrolimus level  Result Value Ref Range   Tacrolimus Lvl 3.1 2.0 - 20.0 ng/mL  CBC with Differential/Platelet  Result Value Ref Range   WBC 7.6 3.4 - 10.8 x10E3/uL   RBC 3.93 3.77 - 5.28 x10E6/uL   Hemoglobin 11.3 11.1 - 15.9 g/dL   Hematocrit 35.7 34.0 - 46.6 %   MCV 91 79 - 97 fL   MCH 28.8 26.6 - 33.0 pg   MCHC 31.7 31.5 - 35.7 g/dL   RDW 13.7 12.3 - 15.4 %   Platelets 233 150 - 379 x10E3/uL   Neutrophils 67 Not Estab. %   Lymphs 23 Not Estab. %   Monocytes 7 Not Estab. %   Eos 3 Not Estab. %   Basos 0 Not Estab. %   Neutrophils Absolute 5.1 1.4 - 7.0 x10E3/uL   Lymphocytes Absolute 1.8 0.7 - 3.1 x10E3/uL   Monocytes Absolute 0.5 0.1 - 0.9 x10E3/uL   EOS (ABSOLUTE) 0.2 0.0 - 0.4 x10E3/uL   Basophils Absolute 0.0 0.0 - 0.2 x10E3/uL   Immature Granulocytes 0 Not Estab. %   Immature Grans (Abs) 0.0 0.0 - 0.1 x10E3/uL  Magnesium  Result Value Ref Range   Magnesium 1.4 (L) 1.6 - 2.3 mg/dL  Phosphorus  Result Value Ref Range   Phosphorus 2.7 2.5 - 4.5 mg/dL  Basic metabolic panel  Result Value Ref Range   Glucose 97 65 - 99 mg/dL   BUN 23 6 - 24 mg/dL   Creatinine, Ser 1.85 (H) 0.57 - 1.00 mg/dL   GFR calc non Af  Amer 31 (L) >59 mL/min/1.73   GFR calc Af Amer 36 (L) >59 mL/min/1.73   BUN/Creatinine Ratio 12 9 - 23   Sodium 132 (L) 134 - 144 mmol/L   Potassium 4.7 3.5 - 5.2 mmol/L   Chloride 98 96 - 106 mmol/L   CO2 21 20 - 29 mmol/L   Calcium 9.5 8.7 - 10.2 mg/dL      Assessment & Plan:   1. Type 2 diabetes mellitus with stage 3 chronic kidney disease, without long-term current use of insulin ([place])  Stat labs received. Her Cr is above her baseline and has one functioning donor kidney. Have called her and instructed her to go to the ER. I have called ahead and let the ER know.    - Comp Met (CMET)  2. CKD (chronic kidney disease) stage 3, GFR 30-59 ml/min (HCC)  - Phosphorus - Magnesium - CBC with Differential  3. End stage renal disease (HCC)  - Phosphorus - Magnesium - CBC with Differential  4. Renal transplant, status post  - Tacrolimus level  5. Fatigue, unspecified type  - Comp Met (CMET) - Phosphorus - Magnesium - CBC with Differential  I have spent 25 minutes with this  patient, >50% of which was spent on counseling and coordination of care.    Follow up plan: Return if symptoms worsen or fail to improve.  Carles Collet, PA-C [facility] 01/24/2018, 2:38 PM

## 2018-01-20 NOTE — Telephone Encounter (Signed)
Correction - pt. Did not need to speak with a nurse.

## 2018-01-20 NOTE — Telephone Encounter (Signed)
Called pt. Reports "I already have an appointment and I'm headed to the office now." Does need to "talk a nurse right now."

## 2018-01-20 NOTE — Discharge Instructions (Addendum)
As we discussed please call your nephrologist tomorrow to arrange a follow-up appointment for Monday or Tuesday for recheck/reevaluation.  Drink plenty of fluids over the next several days.  Return to the emergency department for any increased weakness, decreased urine output, or any other symptom personally concerning to yourself.

## 2018-01-20 NOTE — ED Triage Notes (Signed)
Patient states "my primary care doctor sent me here to get labs drawn. Patient states I have been feeling very fatigued and drained X 2 days" HX Kidney transplant August 2015 Denies any pain. Denies any urinary symptoms at this time

## 2018-01-20 NOTE — Telephone Encounter (Signed)
Spoke personally to this patient about significant decline in her kidney function. She is s/p transplant in Aug 2015 with [facility]. She was fatigued and feeling unwell in clinic today. Due to her transplant status, have directed her to the ER for further evaluation and treatment. She is currently at [facility] blood draw and she may go to [facility] or [facility]. She says she will go to [facility] and I have called ahead to [place].

## 2018-01-20 NOTE — ED Provider Notes (Addendum)
[facility] Emergency Department Provider Note  Time seen: 6:25 PM  I have reviewed the triage vital signs and the nursing notes.   HISTORY  Chief Complaint No chief complaint on file.    HPI [name] is a 52 y.o. female with a past medical history of end-stage renal disease previously on hemodialysis status post kidney transplant, hypertension, seizure disorder, who presents to the emergency department for generalized fatigue and weakness.  According to the patient for the past 2 to 3 days she has felt very fatigued and weak.  Denies any specific complaints.  Denies any chest pain or trouble breathing, fever, nausea, vomiting, diarrhea.  Patient had blood work drawn earlier today, was told that her kidney function declined and she is to go to the emergency department for evaluation and for a renal ultrasound.  Patient states her transplant team is at [facility].   Past Medical History:  Diagnosis Date  . Arrhythmia   . Cerebrovascular accident [facility]) 2010   without any focal neurological deficits.   . Clotted renal dialysis AV graft ([place])   . Dependence on hemodialysis ([place]) 11/17/2013  . Ductal carcinoma in situ (DCIS) of left breast   . End stage renal disease ([place])    a. previously on dialysis on Tues, Thurs, & 2022-11-18 x 7 yrs; b. s/p deceased donor transplant 04/11/2014   . GERD (gastroesophageal reflux disease)   . History of methicillin resistant Staphylococcus aureus infection   . History of stress test    a. 04/2013: normal, EF 65%  . HIT (heparin-induced thrombocytopenia) ([place])   . Hypertension   . Mitral regurgitation    a. 01/2014: EF >55%, LVH, mild MR, dilated LA  . Renal transplant, status post 04/11/2014  . RLS (restless legs syndrome)   . Seizure disorder ([place])   . Seizures ([place])   . Sepsis ([place]) 08/09/2016  . Stroke due to intracerebral hemorrhage ([place]) 11/17/2013    Patient Active Problem List   Diagnosis Date Noted  . Chronic bilateral low  back pain with left-sided sciatica 09/27/2017  . Age-related nuclear cataract of both eyes 08/19/2017  . Diabetes mellitus type 2 without retinopathy ([place]) 08/19/2017  . Hyperopia with astigmatism and presbyopia, bilateral 08/19/2017  . Hypertensive retinopathy of both eyes 08/19/2017  . DM (diabetes mellitus), type 2 with renal complications ([place]) 40/05/2724  . Vitamin D deficiency 10/02/2016  . 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 CKD (chronic kidney disease) stage 3, GFR 30-59 ml/min (HCC) 08/17/2016  . Neuropathic pain of right thigh 08/17/2016  . History of CVA (cerebrovascular accident) 08/17/2016  . Ductal carcinoma in situ (DCIS) of left breast 09/12/2015  . Mitral regurgitation   . Renal transplant, status post 04-11-14  . GERD (gastroesophageal reflux disease) 2014-04-11  . SVT (supraventricular tachycardia) ([place]) 11/17/2013  . End stage renal disease ([place]) 11/17/2013  . Seizure disorder ([place]) 11/17/2013  . Benign hypertensive renal disease 11/17/2013    Past Surgical History:  Procedure Laterality Date  . INSERTION OF DIALYSIS CATHETER     x 2  . KIDNEY TRANSPLANT  03/2014   right side.  Marland Kitchen MASTECTOMY PARTIAL / LUMPECTOMY Left 09/24/2015  . MASTECTOMY PARTIAL / LUMPECTOMY Left 10/25/2015    Prior to Admission medications   Medication Sig Start Date End Date Taking? Authorizing Provider  aspirin EC 81 MG tablet Take 81 mg by mouth daily.    [provider]  carvedilol (COREG) 12.5 MG tablet Take 12.5 mg by mouth  2 (two) times daily with a meal.  08/06/16   [provider]  cetirizine (ZYRTEC) 10 MG tablet Take 1 tablet (10 mg total) by mouth daily. 11/19/16   Johnson, Megan P, DO  Cholecalciferol (VITAMIN D PO) Take by mouth daily.    [provider]  diclofenac sodium (VOLTAREN) 1 % GEL Apply 4 g topically 4 (four) times daily. 09/27/17   Johnson, Megan P, DO  fluticasone (FLONASE) 50 MCG/ACT nasal spray Two sprays each nare  twice a day. 12/06/17   Johnson, Megan P, DO  gabapentin (NEURONTIN) 300 MG capsule Take 300-600 mg by mouth 3 (three) times daily. Take 1 capsule (300 mg) by mouth in the morning and at noon, 600 mg at bedtime. 06/13/14   [provider]  levETIRAcetam (KEPPRA) 500 MG tablet Take 500 mg by mouth 2 (two) times daily.  11/09/16   [provider]  lidocaine (XYLOCAINE) 2 % solution Use as directed 5 mLs in the mouth or throat as needed for mouth pain. 10/04/17   Volney American, PA-C  metFORMIN (GLUCOPHAGE-XR) 500 MG 24 hr tablet Take 1 tablet (500 mg total) by mouth daily with breakfast. 08/12/17   Park Liter P, DO  omeprazole (PRILOSEC) 20 MG capsule Take 20 mg by mouth at bedtime.  09/15/13   [provider]  sodium bicarbonate 650 MG tablet Take 2 tablets (1300 mg) three times a day. 04/11/14   [provider]  SSD 1 % cream  09/05/16   [provider]  tacrolimus (PROGRAF) 1 MG capsule Take 4 mg by mouth 2 (two) times daily.  06/06/14   [provider]  tamoxifen (NOLVADEX) 20 MG tablet Take 20 mg by mouth daily. 12/07/16   [provider]  valACYclovir (VALTREX) 1000 MG tablet Take 1 tablet (1,000 mg total) by mouth 2 (two) times daily. 06/03/17   Park Liter P, DO    Allergies  Allergen Reactions  . Heparin     "i bled for a long time and had to have 2 blood transfusions"  . Ibuprofen     Caused kidney damage Can't take due to kidneys  . Other     Other reaction(s): Other (See Comments) Causing HITT  . Penicillins     Childhood reaction Patient is unsure if she's allergic, was told she had a reaction as a child but thinks she's taking a "cillin" while sick without having a reaction.  Pt states she has taken amoxicillin in the past and did not have problem    Family History  Problem Relation Age of Onset  . Heart attack Mother   . Hypertension Mother   . Hyperlipidemia Mother   . Cancer Mother        breast  . Heart  attack Father   . Hypertension Father   . Hyperlipidemia Father   . Kidney failure Sister        half sister  . Diabetes Maternal Grandmother   . Hearing loss Maternal Grandmother   . Heart failure Maternal Grandmother   . Diabetes Maternal Grandfather   . Stroke Maternal Grandfather   . Cancer Paternal Grandmother        breast  . Stroke Paternal Grandfather   . Aneurysm Brother        Brain  . Other Sister        Nerve problems    Social History Social History   Tobacco Use  . Smoking status: Former Smoker  Last attempt to quit: 11/25/2008    Years since quitting: 9.1  . Smokeless tobacco: Never Used  Substance Use Topics  . Alcohol use: Yes    Alcohol/week: 1.2 oz    Types: 2 Glasses of wine per week  . Drug use: No    Types: "Crack" cocaine    Comment: quit in 2003    Review of Systems Constitutional: Positive for generalized fatigue/weakness.  Negative for fever. Eyes: Negative for visual complaints ENT: Negative for recent illness/congestion Cardiovascular: Negative for chest pain. Respiratory: Negative for shortness of breath. Gastrointestinal: Negative for abdominal pain, vomiting and diarrhea. Genitourinary: Negative for urinary compaints  Musculoskeletal: Negative for musculoskeletal complaints Skin: Negative for skin complaints  Neurological: Negative for headache All other ROS negative  ____________________________________________   PHYSICAL EXAM:  VITAL SIGNS: ED Triage Vitals  Enc Vitals Group     BP 01/20/18 1718 125/88     Pulse Rate 01/20/18 1718 79     Resp 01/20/18 1718 14     Temp 01/20/18 1718 97.9 F (36.6 C)     Temp Source 01/20/18 1718 Oral     SpO2 01/20/18 1718 100 %     Weight 01/20/18 1720 207 lb (93.9 kg)     Height 01/20/18 1720 5\' 6"  (1.676 m)     Head Circumference --      Peak Flow --      Pain Score 01/20/18 1719 8     Pain Loc --      Pain Edu? --      Excl. in [place]? --     Constitutional: Alert and oriented.  Well appearing and in no distress. Eyes: Normal exam ENT   Head: Normocephalic and atraumatic   Mouth/Throat: Mucous membranes are moist. Cardiovascular: Normal rate, regular rhythm. No murmur Respiratory: Normal respiratory effort without tachypnea nor retractions. Breath sounds are clear  Gastrointestinal: Soft and nontender. No distention. Musculoskeletal: Nontender with normal range of motion in all extremities.  Neurologic:  Normal speech and language. No gross focal neurologic deficits Skin:  Skin is warm, dry and intact.  Psychiatric: Mood and affect are normal.  ____________________________________________     RADIOLOGY  Ultrasound is essentially normal.  ____________________________________________   INITIAL IMPRESSION / ASSESSMENT AND PLAN / ED COURSE  Pertinent labs & imaging results that were available during my care of the patient were reviewed by me and considered in my medical decision making (see chart for details).  Patient presents to the emergency department for generalized fatigue and weakness.  Differential is quite broad would include electrolyte or metabolic abnormality, renal insufficiency, dehydration, infectious etiology.  We will check labs, urinalysis obtain a renal ultrasound and continue to closely monitor.  Patient's lab do show a moderate decline in renal function creatinine at baseline appears to be between 1.6 and 2.0 currently 2.64.  Rest the labs are largely unchanged from baseline.  Urinalysis is normal.  Currently awaiting ultrasound results, will discuss with [facility] kidney transplant team to discuss any further management.  We will IV hydrate while awaiting results.  Discussed the patient with [facility] nephrology.  They recommend IV hydrating the patient.  As long as the ultrasound is normal they state they will follow-up with the patient next week.  Patient's ultrasound is normal, labs are largely baseline besides the renal insufficiency.  I  discussed with the patient drinking plenty fluids and calling nephrology tomorrow morning to arrange a follow-up appointment for early next week for recheck of her labs.  Patient agreeable to this plan of care.  EKG reviewed and interpreted by myself shows normal sinus rhythm at 74 bpm with a narrow QRS, normal axis, normal intervals, no concerning ST changes noted.  ____________________________________________   FINAL CLINICAL IMPRESSION(S) / ED DIAGNOSES  Weakness Acute renal insufficiency    Harvest Dark, MD 01/20/18 2024    Harvest Dark, MD 02/01/18 1133

## 2018-01-20 NOTE — Unmapped (Signed)
[facility] Refill Coordination Note    Specialty Medication(s) to be Shipped:   Transplant: Myfortic 180mg  and Prograf 1mg      Nancy Bowman, DOB: 12-06-1965  Phone: [phone] (home)   Shipping Address: [address]  [place] [address]    All above HIPAA information was verified with patient.     Completed refill call assessment today to schedule patient's medication shipment from the [facility] [phone]).       Specialty medication(s) and dose(s) confirmed: Regimen is correct and unchanged.   Changes to medications: Nancy Bowman reports no changes reported at this time.  Changes to insurance: No  Questions for the pharmacist: No    The patient will receive an FSI print out for each medication shipped and additional FDA Medication Guides as required.  Patient education from Nancy Bowman or Nancy Bowman may also be included in the shipment.    DISEASE-SPECIFIC INFORMATION        N/A    ADHERENCE     Medication Adherence    Patient reported X missed doses in the last month:  0         MEDICARE PART B DOCUMENTATION     Myfortic 180mg : Patient has 10 days worth of tablets on hand.  Prograf 1mg : Patient has 10 days worth of capsules on hand.    SHIPPING     Shipping address confirmed in FSI.     Delivery Scheduled: Yes, Expected medication delivery date: 01/28/2018 via UPS or courier.     Nancy Bowman   [facility] Specialty Technician

## 2018-01-21 NOTE — Unmapped (Signed)
Pt. Called and said yesterday she was feeling real fatigued and tired. She saw her PCP who sent her to her local ED. She said they told her that her creatinine was 2.6 and she was dehydrated and gave her IV fluids. I instructed the pt. To hydrate herself well over the weekend and repeat labs next week. Pt. Agreed with plan.

## 2018-01-22 LAB — TACROLIMUS LEVEL
TACROLIMUS (FK506) - LABCORP: 9 ng/mL (ref 2.0–20.0)
Tacrolimus (FK506) - LabCorp: 9.7 ng/mL (ref 2.0–20.0)

## 2018-01-26 ENCOUNTER — Other Ambulatory Visit
Admission: RE | Admit: 2018-01-26 | Discharge: 2018-01-26 | Disposition: A | Discharge status: Home / Self Care | Payer: Medicare Other | Source: Ambulatory Visit | Attending: Physician Assistant | Admitting: Physician Assistant

## 2018-01-26 DIAGNOSIS — E1129 Type 2 diabetes mellitus with other diabetic kidney complication: Secondary | ICD-10-CM | POA: Insufficient documentation

## 2018-01-26 DIAGNOSIS — D899 Disorder involving the immune mechanism, unspecified: Secondary | ICD-10-CM | POA: Insufficient documentation

## 2018-01-26 DIAGNOSIS — D631 Anemia in chronic kidney disease: Secondary | ICD-10-CM | POA: Diagnosis not present

## 2018-01-26 DIAGNOSIS — Z79899 Other long term (current) drug therapy: Secondary | ICD-10-CM | POA: Insufficient documentation

## 2018-01-26 DIAGNOSIS — Z789 Other specified health status: Secondary | ICD-10-CM | POA: Insufficient documentation

## 2018-01-26 DIAGNOSIS — E559 Vitamin D deficiency, unspecified: Secondary | ICD-10-CM | POA: Insufficient documentation

## 2018-01-26 DIAGNOSIS — B259 Cytomegaloviral disease, unspecified: Secondary | ICD-10-CM | POA: Insufficient documentation

## 2018-01-26 DIAGNOSIS — N189 Chronic kidney disease, unspecified: Secondary | ICD-10-CM | POA: Insufficient documentation

## 2018-01-26 DIAGNOSIS — Z940 Kidney transplant status: Secondary | ICD-10-CM | POA: Diagnosis not present

## 2018-01-26 DIAGNOSIS — Z9483 Pancreas transplant status: Secondary | ICD-10-CM | POA: Insufficient documentation

## 2018-01-26 DIAGNOSIS — E1122 Type 2 diabetes mellitus with diabetic chronic kidney disease: Secondary | ICD-10-CM | POA: Diagnosis not present

## 2018-01-26 DIAGNOSIS — Z114 Encounter for screening for human immunodeficiency virus [HIV]: Secondary | ICD-10-CM | POA: Insufficient documentation

## 2018-01-26 DIAGNOSIS — N390 Urinary tract infection, site not specified: Secondary | ICD-10-CM | POA: Insufficient documentation

## 2018-01-26 LAB — BASIC METABOLIC PANEL
Anion gap: 8 (ref 5–15)
BUN: 34 mg/dL — ABNORMAL HIGH (ref 6–20)
CO2: 19 mmol/L — ABNORMAL LOW (ref 22–32)
Calcium: 10.1 mg/dL (ref 8.9–10.3)
Chloride: 105 mmol/L (ref 98–111)
Creatinine, Ser: 2.04 mg/dL — ABNORMAL HIGH (ref 0.44–1.00)
GFR calc Af Amer: 31 mL/min — ABNORMAL LOW (ref >60)
GFR calc non Af Amer: 27 mL/min — ABNORMAL LOW (ref >60)
Glucose, Bld: 128 mg/dL — ABNORMAL HIGH (ref 70–99)
Potassium: 5.4 mmol/L — ABNORMAL HIGH (ref 3.5–5.1)
Sodium: 132 mmol/L — ABNORMAL LOW (ref 135–145)

## 2018-01-26 LAB — CBC WITH DIFFERENTIAL/PLATELET
Basophils Absolute: 0.1 10*3/uL (ref 0–0.1)
Basophils Relative: 1 %
Eosinophils Absolute: 0.1 10*3/uL (ref 0–0.7)
Eosinophils Relative: 2 %
HCT: 35.6 % (ref 35.0–47.0)
Hemoglobin: 12 g/dL (ref 12.0–16.0)
Lymphocytes Relative: 20 %
Lymphs Abs: 1.2 10*3/uL (ref 1.0–3.6)
MCH: 31 pg (ref 26.0–34.0)
MCHC: 33.6 g/dL (ref 32.0–36.0)
MCV: 92.3 fL (ref 80.0–100.0)
Monocytes Absolute: 0.4 10*3/uL (ref 0.2–0.9)
Monocytes Relative: 7 %
Neutro Abs: 4.2 10*3/uL (ref 1.4–6.5)
Neutrophils Relative %: 70 %
Platelets: 201 10*3/uL (ref 150–440)
RBC: 3.86 MIL/uL (ref 3.80–5.20)
RDW: 14.2 % (ref 11.5–14.5)
WBC: 5.9 10*3/uL (ref 3.6–11.0)

## 2018-01-26 LAB — MAGNESIUM: Magnesium: 1.6 mg/dL — ABNORMAL LOW (ref 1.7–2.4)

## 2018-01-26 LAB — PHOSPHORUS: Phosphorus: 2.5 mg/dL (ref 2.5–4.6)

## 2018-01-26 NOTE — Unmapped (Signed)
Pt called and stated she is at [facility] to get labs, pt is there and needs orders.  TNC will let TPA and TNC know to fax labs to [phone]

## 2018-01-27 MED FILL — PROGRAF/1MG/CAP: PROGRAF/1MG/CAP | 30 days supply | Qty: 240 | Fill #4

## 2018-01-27 MED FILL — MYFORTIC/180MG/TAB: MYFORTIC/180MG/TAB | 30 days supply | Qty: 180 | Fill #4

## 2018-01-28 ENCOUNTER — Other Ambulatory Visit: Payer: Self-pay | Admitting: Nephrology

## 2018-02-04 MED ORDER — LEVETIRACETAM 500 MG TABLET
ORAL_TABLET | Freq: Two times a day (BID) | ORAL | 11 refills | 0 days | Status: CP
Start: 2018-02-04 — End: 2019-04-02

## 2018-02-09 ENCOUNTER — Ambulatory Visit: Payer: Self-pay | Admitting: *Deleted

## 2018-02-09 NOTE — Telephone Encounter (Signed)
Pt stating that she has been having diarrhea since yesterday and thinks it may be related to eating a salad.Pt states that she began to feel sick on the stomach approximately 30-40 min after eating the salad yesterday and had diarrhea up until 11pm last night. Pt states that she also had one episode of diarrhea this morning around 10:00-10:30am and states at that time she noticed bright red blood in the stool. Pt states it was not a lot of blood and she also had stool in the toilet as well.Pt states she had 8-10 episodes of diarrhea within the past 24 hours.Pt denies any other symptoms at this time.Pt has not taken any over the counter medications for diarrhea. Pt asking to come in after 1 pm tomorrow due to not being able to miss work and has requested to see another provider due to Dr. Wynetta Emery not having an appt available at that time. Pt advised to go to the ED if symptoms become worse before seen for appt on tomorrow. Pt verbalized understanding. Pt scheduled with Merrie Roof, PA on 7/11 at 1:45pm.   Reason for Disposition . MILD rectal bleeding (more than just a few drops or streaks) . [1] SEVERE diarrhea (e.g., 7 or more times / day more than normal) AND [2] present > 24 hours (1 day)  Answer Assessment - Initial Assessment Questions 1. APPEARANCE of BLOOD: "What color is it?" "Is it passed separately, on the surface of the stool, or mixed in with the stool?"      Bright red mixed with stool 2. AMOUNT: "How much blood was passed?"      Pt states a small amount of blood was in the toilet 3. FREQUENCY: "How many times has blood been passed with the stools?"      Once on today 4. ONSET: "When was the blood first seen in the stools?" (Days or weeks)      Today around 10:30-10:45 5. DIARRHEA: "Is there also some diarrhea?" If so, ask: "How many diarrhea stools were passed in past 24 hours?"     Yes diarrhea since yesterday. Pt states she has gone approximately 8-10 times within the past 24 hours 6.  CONSTIPATION: "Do you have constipation?" If so, "How bad is it?"     No 7. RECURRENT SYMPTOMS: "Have you had blood in your stools before?" If so, ask: "When was the last time?" and "What happened that time?"      Just a little bit previouslyl but not recently, this time it is more 8. BLOOD THINNERS: "Do you take any blood thinners?" (e.g., Coumadin/warfarin, Pradaxa/dabigatran, aspirin)     Aspirin 9. OTHER SYMPTOMS: "Do you have any other symptoms?"  (e.g., abdominal pain, vomiting, dizziness, fever)     No 10. PREGNANCY: "Is there any chance you are pregnant?" "When was your last menstrual period?"       No preganancy and just spots at this time, does not have menstrual cycles  Answer Assessment - Initial Assessment Questions 1. DIARRHEA SEVERITY: "How bad is the diarrhea?" "How many extra stools have you had in the past 24 hours than normal?"    - NO DIARRHEA (SCALE 0)   - MILD (SCALE 1-3): Few loose or mushy BMs; increase of 1-3 stools over normal daily number of stools; mild increase in ostomy output.   -  MODERATE (SCALE 4-7): Increase of 4-6 stools daily over normal; moderate increase in ostomy output. * SEVERE (SCALE 8-10; OR 'WORST POSSIBLE'): Increase of 7 or more  stools daily over normal; moderate increase in ostomy output; incontinence.     Severe 8-10 2. ONSET: "When did the diarrhea begin?"      Yesterday after eating a salad 3. BM CONSISTENCY: "How loose or watery is the diarrhea?"      Loose  4. VOMITING: "Are you also vomiting?" If so, ask: "How many times in the past 24 hours?"      No vomtting 5. ABDOMINAL PAIN: "Are you having any abdominal pain?" If yes: "What does it feel like?" (e.g., crampy, dull, intermittent, constant)      Pt states she does have some cramping before she has the episode of diarrhea 6. ABDOMINAL PAIN SEVERITY: If present, ask: "How bad is the pain?"  (e.g., Scale 1-10; mild, moderate, or severe)   - MILD (1-3): doesn't interfere with normal  activities, abdomen soft and not tender to touch    - MODERATE (4-7): interferes with normal activities or awakens from sleep, tender to touch    - SEVERE (8-10): excruciating pain, doubled over, unable to do any normal activities       Pain rating not given 7. ORAL INTAKE: If vomiting, "Have you been able to drink liquids?" "How much fluids have you had in the past 24 hours?"     Pt states she has been able to drink water 8. HYDRATION: "Any signs of dehydration?" (e.g., dry mouth [not just dry lips], too weak to stand, dizziness, new weight loss) "When did you last urinate?"     No signs of dehydration 9. EXPOSURE: "Have you traveled to a foreign country recently?" "Have you been exposed to anyone with diarrhea?" "Could you have eaten any food that was spoiled?"     No 10. ANTIBIOTIC USE: "Are you taking antibiotics now or have you taken antibiotics in the past 2 months?"       Not assessed 11. OTHER SYMPTOMS: "Do you have any other symptoms?" (e.g., fever, blood in stool)       Blood in stool 12. PREGNANCY: "Is there any chance you are pregnant?" "When was your last menstrual period?"       No  Protocols used: RECTAL BLEEDING-A-AH, DIARRHEA-A-AH

## 2018-02-10 ENCOUNTER — Encounter: Payer: Self-pay | Admitting: Family Medicine

## 2018-02-10 ENCOUNTER — Ambulatory Visit (INDEPENDENT_AMBULATORY_CARE_PROVIDER_SITE_OTHER): Payer: Medicare Other | Admitting: Family Medicine

## 2018-02-10 VITALS — BP 124/76 | HR 76 | Temp 97.7°F | Ht 65.0 in | Wt 208.6 lb

## 2018-02-10 DIAGNOSIS — K921 Melena: Secondary | ICD-10-CM | POA: Diagnosis not present

## 2018-02-10 DIAGNOSIS — R197 Diarrhea, unspecified: Secondary | ICD-10-CM | POA: Diagnosis not present

## 2018-02-10 NOTE — Progress Notes (Signed)
BP 124/76 (BP Location: Left Arm, Patient Position: Sitting, Cuff Size: Normal)   Pulse 76   Temp 97.7 F (36.5 C) (Oral)   Ht 5\' 5"  (1.651 m)   Wt 208 lb 9.6 oz (94.6 kg)   LMP 03/03/2016   SpO2 100%   BMI 34.71 kg/m    Subjective:    Patient ID: [name], female    DOB: 09-17-1965, 52 y.o.   MRN: [id number]  HPI: [name] is a 52 y.o. female  Chief Complaint  Patient presents with  . Diarrhea    Started yesterday. Patient is better. Noticed blood in stool.   . Blood In Stools   Pt here today for evaluation of frequent diarrhea yesterday after eating a salad, which has been known to cause diarrhea for her in the past. States she had some nausea and one episode of very minimal blood with wiping. Had some ginger ale and crackers and feels much better today. No ongoing sxs since she woke up this morning. No hx of hemorrhoids, fevers, abdominal pain, anorectal pain, fatigue, SOB. Did not try anything OTC for sxs.   Relevant past medical, surgical, family and social history reviewed and updated as indicated. Interim medical history since our last visit reviewed. Allergies and medications reviewed and updated.  Review of Systems  Per HPI unless specifically indicated above     Objective:    BP 124/76 (BP Location: Left Arm, Patient Position: Sitting, Cuff Size: Normal)   Pulse 76   Temp 97.7 F (36.5 C) (Oral)   Ht 5\' 5"  (1.651 m)   Wt 208 lb 9.6 oz (94.6 kg)   LMP 03/03/2016   SpO2 100%   BMI 34.71 kg/m   Wt Readings from Last 3 Encounters:  02/10/18 208 lb 9.6 oz (94.6 kg)  01/20/18 207 lb (93.9 kg)  01/20/18 207 lb (93.9 kg)    Physical Exam  Constitutional: She is oriented to person, place, and time. She appears well-developed and well-nourished. No distress.  HENT:  Head: Atraumatic.  Eyes: Pupils are equal, round, and reactive to light. Conjunctivae are normal.  Neck: Normal range of motion. Neck supple.  Cardiovascular: Normal rate and  regular rhythm.  Pulmonary/Chest: Effort normal and breath sounds normal.  Abdominal: Soft. Bowel sounds are normal. She exhibits no distension. There is no tenderness. There is no guarding.  Genitourinary:  Genitourinary Comments: Rectal exam declined  Musculoskeletal: Normal range of motion. She exhibits no tenderness (no CVA tenderness b/l).  Neurological: She is alert and oriented to person, place, and time.  Skin: Skin is warm and dry.  Psychiatric: She has a normal mood and affect. Her behavior is normal.  Nursing note and vitals reviewed.  Results for orders placed or performed in visit on 02/10/18  CBC with Differential/Platelet  Result Value Ref Range   WBC 7.3 3.4 - 10.8 x10E3/uL   RBC 3.58 (L) 3.77 - 5.28 x10E6/uL   Hemoglobin 11.0 (L) 11.1 - 15.9 g/dL   Hematocrit 34.0 34.0 - 46.6 %   MCV 95 79 - 97 fL   MCH 30.7 26.6 - 33.0 pg   MCHC 32.4 31.5 - 35.7 g/dL   RDW 14.9 12.3 - 15.4 %   Platelets 235 150 - 450 x10E3/uL   Neutrophils 67 Not Estab. %   Lymphs 22 Not Estab. %   Monocytes 9 Not Estab. %   Eos 2 Not Estab. %   Basos 0 Not Estab. %   Neutrophils Absolute  4.9 1.4 - 7.0 x10E3/uL   Lymphocytes Absolute 1.6 0.7 - 3.1 x10E3/uL   Monocytes Absolute 0.7 0.1 - 0.9 x10E3/uL   EOS (ABSOLUTE) 0.1 0.0 - 0.4 x10E3/uL   Basophils Absolute 0.0 0.0 - 0.2 x10E3/uL   Immature Granulocytes 0 Not Estab. %   Immature Grans (Abs) 0.0 0.0 - 0.1 x10E3/uL      Assessment & Plan:   Problem List Items Addressed This Visit    None    Visit Diagnoses    Blood in stool    -  Primary   Relevant Orders   CBC with Differential/Platelet (Completed)   Diarrhea, unspecified type        Vitals and exam benign, pt well appearing and states sxs fully resolved since she called to schedule yesterday. CBC benign. Pt declines U/A and guiac. Return precautions reviewed. Avoid trigger foods, imodium prn for diarrhea, prep H cream if any further bleeding or any pain.  Follow up plan: Return  if symptoms worsen or fail to improve.

## 2018-02-11 LAB — CBC WITH DIFFERENTIAL/PLATELET
BASOS: 0 %
Basophils Absolute: 0 10*3/uL (ref 0.0–0.2)
EOS (ABSOLUTE): 0.1 10*3/uL (ref 0.0–0.4)
EOS: 2 %
HEMATOCRIT: 34 % (ref 34.0–46.6)
Hemoglobin: 11 g/dL — ABNORMAL LOW (ref 11.1–15.9)
Immature Grans (Abs): 0 10*3/uL (ref 0.0–0.1)
Immature Granulocytes: 0 %
LYMPHS ABS: 1.6 10*3/uL (ref 0.7–3.1)
Lymphs: 22 %
MCH: 30.7 pg (ref 26.6–33.0)
MCHC: 32.4 g/dL (ref 31.5–35.7)
MCV: 95 fL (ref 79–97)
MONOS ABS: 0.7 10*3/uL (ref 0.1–0.9)
Monocytes: 9 %
Neutrophils Absolute: 4.9 10*3/uL (ref 1.4–7.0)
Neutrophils: 67 %
Platelets: 235 10*3/uL (ref 150–450)
RBC: 3.58 x10E6/uL — ABNORMAL LOW (ref 3.77–5.28)
RDW: 14.9 % (ref 12.3–15.4)
WBC: 7.3 10*3/uL (ref 3.4–10.8)

## 2018-02-11 NOTE — Progress Notes (Signed)
Tried calling patient, no answer. Will try again later.  

## 2018-02-13 NOTE — Patient Instructions (Signed)
Follow up as needed

## 2018-02-16 ENCOUNTER — Encounter: Admit: 2018-02-16 | Discharge: 2018-02-16 | Payer: MEDICARE

## 2018-02-16 ENCOUNTER — Encounter: Admit: 2018-02-16 | Discharge: 2018-02-16 | Payer: MEDICARE | Attending: Nephrology | Primary: Nephrology

## 2018-02-16 DIAGNOSIS — Z79899 Other long term (current) drug therapy: Secondary | ICD-10-CM

## 2018-02-16 DIAGNOSIS — Z940 Kidney transplant status: Principal | ICD-10-CM

## 2018-02-16 DIAGNOSIS — D899 Disorder involving the immune mechanism, unspecified: Secondary | ICD-10-CM

## 2018-02-16 DIAGNOSIS — K0889 Other specified disorders of teeth and supporting structures: Secondary | ICD-10-CM

## 2018-02-16 DIAGNOSIS — E872 Acidosis: Secondary | ICD-10-CM

## 2018-02-16 DIAGNOSIS — E11638 Type 2 diabetes mellitus with other oral complications: Secondary | ICD-10-CM | POA: Diagnosis not present

## 2018-02-16 DIAGNOSIS — H52209 Unspecified astigmatism, unspecified eye: Secondary | ICD-10-CM | POA: Diagnosis not present

## 2018-02-16 DIAGNOSIS — Z79810 Long term (current) use of selective estrogen receptor modulators (SERMs): Secondary | ICD-10-CM | POA: Diagnosis not present

## 2018-02-16 DIAGNOSIS — E11319 Type 2 diabetes mellitus with unspecified diabetic retinopathy without macular edema: Secondary | ICD-10-CM | POA: Diagnosis not present

## 2018-02-16 DIAGNOSIS — Z7984 Long term (current) use of oral hypoglycemic drugs: Secondary | ICD-10-CM | POA: Diagnosis not present

## 2018-02-16 DIAGNOSIS — J302 Other seasonal allergic rhinitis: Secondary | ICD-10-CM | POA: Diagnosis not present

## 2018-02-16 DIAGNOSIS — E1140 Type 2 diabetes mellitus with diabetic neuropathy, unspecified: Secondary | ICD-10-CM | POA: Diagnosis not present

## 2018-02-16 DIAGNOSIS — Z79891 Long term (current) use of opiate analgesic: Secondary | ICD-10-CM | POA: Diagnosis not present

## 2018-02-16 DIAGNOSIS — M79606 Pain in leg, unspecified: Secondary | ICD-10-CM | POA: Diagnosis not present

## 2018-02-16 DIAGNOSIS — Z7982 Long term (current) use of aspirin: Secondary | ICD-10-CM | POA: Diagnosis not present

## 2018-02-16 DIAGNOSIS — E875 Hyperkalemia: Secondary | ICD-10-CM | POA: Diagnosis not present

## 2018-02-16 DIAGNOSIS — C50919 Malignant neoplasm of unspecified site of unspecified female breast: Secondary | ICD-10-CM | POA: Diagnosis not present

## 2018-02-16 DIAGNOSIS — G8929 Other chronic pain: Secondary | ICD-10-CM | POA: Diagnosis not present

## 2018-02-16 DIAGNOSIS — Z4822 Encounter for aftercare following kidney transplant: Secondary | ICD-10-CM | POA: Diagnosis not present

## 2018-02-16 DIAGNOSIS — I10 Essential (primary) hypertension: Secondary | ICD-10-CM | POA: Diagnosis not present

## 2018-02-16 DIAGNOSIS — M545 Low back pain: Secondary | ICD-10-CM | POA: Diagnosis not present

## 2018-02-16 DIAGNOSIS — E7800 Pure hypercholesterolemia, unspecified: Secondary | ICD-10-CM | POA: Diagnosis not present

## 2018-02-16 LAB — EGFR CKD-EPI AA FEMALE: Lab: 25 — ABNORMAL LOW

## 2018-02-16 LAB — CBC W/ AUTO DIFF
BASOPHILS ABSOLUTE COUNT: 0 10*9/L (ref 0.0–0.1)
EOSINOPHILS ABSOLUTE COUNT: 0.1 10*9/L (ref 0.0–0.4)
EOSINOPHILS RELATIVE PERCENT: 1.6 %
HEMATOCRIT: 33.2 % — ABNORMAL LOW (ref 36.0–46.0)
HEMOGLOBIN: 10.8 g/dL — ABNORMAL LOW (ref 12.0–16.0)
LARGE UNSTAINED CELLS: 3 % (ref 0–4)
LYMPHOCYTES ABSOLUTE COUNT: 1.5 10*9/L (ref 1.5–5.0)
LYMPHOCYTES RELATIVE PERCENT: 21.2 %
MEAN CORPUSCULAR HEMOGLOBIN: 31.2 pg (ref 26.0–34.0)
MEAN CORPUSCULAR VOLUME: 95.5 fL (ref 80.0–100.0)
MEAN PLATELET VOLUME: 8.3 fL (ref 7.0–10.0)
MONOCYTES ABSOLUTE COUNT: 0.4 10*9/L (ref 0.2–0.8)
MONOCYTES RELATIVE PERCENT: 5.2 %
NEUTROPHILS ABSOLUTE COUNT: 4.8 10*9/L (ref 2.0–7.5)
NEUTROPHILS RELATIVE PERCENT: 68.3 %
PLATELET COUNT: 232 10*9/L (ref 150–440)
RED BLOOD CELL COUNT: 3.48 10*12/L — ABNORMAL LOW (ref 4.00–5.20)
RED CELL DISTRIBUTION WIDTH: 14.2 % (ref 12.0–15.0)
WBC ADJUSTED: 7 10*9/L (ref 4.5–11.0)

## 2018-02-16 LAB — COMPREHENSIVE METABOLIC PANEL
ALBUMIN: 4 g/dL (ref 3.5–5.0)
ALKALINE PHOSPHATASE: 42 U/L (ref 38–126)
ALT (SGPT): 15 U/L (ref 15–48)
ANION GAP: 7 mmol/L — ABNORMAL LOW (ref 9–15)
AST (SGOT): 15 U/L (ref 14–38)
BILIRUBIN TOTAL: 0.3 mg/dL (ref 0.0–1.2)
BLOOD UREA NITROGEN: 35 mg/dL — ABNORMAL HIGH (ref 7–21)
BUN / CREAT RATIO: 14
CALCIUM: 10.2 mg/dL (ref 8.5–10.2)
CHLORIDE: 105 mmol/L (ref 98–107)
CO2: 23 mmol/L (ref 22.0–30.0)
CREATININE: 2.51 mg/dL — ABNORMAL HIGH (ref 0.60–1.00)
EGFR CKD-EPI AA FEMALE: 25 mL/min/{1.73_m2} — ABNORMAL LOW (ref >=60)
EGFR CKD-EPI NON-AA FEMALE: 21 mL/min/{1.73_m2} — ABNORMAL LOW (ref >=60)
GLUCOSE RANDOM: 133 mg/dL (ref 65–179)
POTASSIUM: 5.2 mmol/L — ABNORMAL HIGH (ref 3.5–5.0)
PROTEIN TOTAL: 7.2 g/dL (ref 6.5–8.3)
SODIUM: 135 mmol/L (ref 135–145)

## 2018-02-16 LAB — LYMPHOCYTES ABSOLUTE COUNT: Lab: 1.5

## 2018-02-16 LAB — URINALYSIS
BLOOD UA: NEGATIVE
GLUCOSE UA: NEGATIVE
HYALINE CASTS: 1 /LPF (ref 0–1)
KETONES UA: NEGATIVE
LEUKOCYTE ESTERASE UA: NEGATIVE
PH UA: 6 (ref 5.0–9.0)
PROTEIN UA: NEGATIVE
RBC UA: <1 /HPF (ref <=4)
SPECIFIC GRAVITY UA: 1.011 (ref 1.003–1.030)
SQUAMOUS EPITHELIAL: <1 /HPF (ref 0–5)
UROBILINOGEN UA: 0.2
WBC UA: <1 /HPF (ref 0–5)

## 2018-02-16 LAB — BLOOD UA: Lab: NEGATIVE

## 2018-02-16 LAB — PHOSPHORUS: Phosphate:MCnc:Pt:Ser/Plas:Qn:: 3.4

## 2018-02-16 LAB — PROTEIN/CREAT RATIO, URINE: Protein/Creatinine:MRto:Pt:Urine:Qn:: 0.067

## 2018-02-16 LAB — PROTEIN / CREATININE RATIO, URINE: CREATININE, URINE: 105.3 mg/dL

## 2018-02-16 LAB — MAGNESIUM: Magnesium:MCnc:Pt:Ser/Plas:Qn:: 1.3 — ABNORMAL LOW

## 2018-02-16 MED ORDER — OXYCODONE-ACETAMINOPHEN 5 MG-325 MG TABLET
ORAL_TABLET | ORAL | 0 refills | 0.00000 days | Status: CP | PRN
Start: 2018-02-16 — End: 2019-01-19

## 2018-02-16 NOTE — Unmapped (Signed)
Transplant Nephrology Clinic Visit      History of Present Illness    Patient is a 52 y.o. 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  She was on dialysis approximately 7 years prior to this transplant, the donor kidney had a KDPI of 65%.  She did have issues with hyperkalemia while on dialysis, and was on Kayexalate weekly.  Her post transplant course has been without rejection or recurrent disease. Patient does have evidence of a donor specific antibody to DP01 at an MFI of 2294 (stable) on 07/08/2016. Her most recent creatinine has ranged between 1.6 and 2.4.    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     In December 2016, she underwent routine mammogram that was abnormal. Follow-up mammogram in January was again highly suggestive of significant lesion in the left breast. She underwent core biopsy on 09/09/2015 which showed ductal carcinoma in situ. She subsequently underwent a partial mastectomy on 09/24/2015. 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 That final pathology showed a positive medial margin, so she underwent reexcision on 10/25/2015 for which the pathology was negative. Her surgical management was felt to be complete.     The patient was hospitalized in [place] in the beginning of January for 2-3 days with sinus congestion, shortness of breath on exertion, and scant hemoptysis. She was diagnosed with URI and treated with Levaquin. Her symptoms have improved.    Interval history since last seen on 08/06/2017    Since last seen, the patient has been feeling well and has had no hospitalizations or ED visits. She followed up with ophthalmology, who will continue to monitor her retinopathy. Patient has started to wear OTC reading glasses for astigmatism. She has been doing well s/p breast conserving therapy and there was no evidence of disease or metastasis on recent surg-onc exam in July.     Today, she reports worsening right sided lower tooth pain since yesterday. The patient states she had a filling dislodge 1.5 years ago and has been trying to establish care with [facility] dental services for repair. This pain worsened significantly yesterday. She has continued to take her home analgesics including tramadol and gabapentin, but without much relief to her oral pain. Patient denies fever, chills, or any further signs of infection.    She took her Prograf at 9:30 last night. Patient did not take her morning dose.     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     Review of Systems    Otherwise on review of systems patient denies fever or chills, chest pain, SOB, PND or orthopnea, lower extremity edema.  Denies abdominal pain.  No dysuria, hematuria or difficulty voiding. Bowel movements normal.  Denies joint pain or rash.  All other systems are reviewed and are negative.    Medications    Current Outpatient Medications   Medication Sig Dispense Refill   ??? acetaminophen (TYLENOL) 500 MG tablet Take 1,000 mg by mouth every four (4) hours as needed.      ??? aspirin 81 MG chewable tablet Chew 1 tablet (81 mg total) daily. 30 tablet 11   ??? carvedilol (COREG) 12.5 MG tablet Take 1 tablet (12.5 mg total) by mouth Two (2) times a day. 60 tablet 11   ??? cetirizine (ZYRTEC) 5 MG chewable tablet Chew 5 mg daily.     ???  cholecalciferol, vitamin D3, (VITAMIN D3) 1,000 unit capsule Take 1,000 Units by mouth daily.     ??? fluticasone (FLONASE) 50 mcg/actuation nasal spray Two sprays each nare twice a day. 16 g 5   ??? gabapentin (NEURONTIN) 300 MG capsule Take 1 capsules (300 mg) by mouth in the morning and at noon, 600 mg in the evening. (Patient taking differently: Take 300 mg by mouth Three (3) times a day. Take 1 capsules (300 mg) by mouth in the morning and at noon, 600 mg in the evening.) 360 capsule 11   ??? levETIRAcetam (KEPPRA) 500 MG tablet Take 1 tablet (500 mg total) by mouth Two (2) times a day. 60 tablet 11   ??? magnesium oxide (MAG-OX) 400 mg (241.3 mg magnesium) tablet Take 400 mg by mouth daily.     ??? metFORMIN (GLUCOPHAGE XR) 500 MG 24 hr tablet Take 1 tablet (500 mg total) by mouth daily. 30 tablet 11   ??? miscellaneous medical supply Misc 1 knee brace 1 each 0   ??? MYFORTIC 180 mg EC tablet Take 2 tablets (360 mg total) by mouth Three (3) times a day. 180 tablet 11   ??? omeprazole (PRILOSEC) 20 MG capsule Take 1 capsule (20 mg total) by mouth Two (2) times a day. (Patient taking differently: Take 20 mg by mouth two (2) times a day as needed. ) 60 capsule 5   ??? oxyCODONE-acetaminophen (PERCOCET) 5-325 mg per tablet Take 1 tablet by mouth every four (4) hours as needed for pain. 20 tablet 0   ??? PROGRAF 1 mg capsule Take  4mg  in the am and 4 mg in the pm. Z94.0 kidney transplant, tx date 03/24/14, DAW1 240 capsule 11   ??? sodium bicarbonate 650 mg tablet Take 2 tablets (1300 mg) three times a day. 180 tablet 11   ??? tamoxifen (NOLVADEX) 20 MG tablet Take 1 tablet (20 mg total) by mouth daily. 90 tablet 4   ??? traMADol (ULTRAM) 50 mg tablet Take 1 tablet (50 mg total) by mouth every eight (8) hours as needed for pain. 60 tablet 0   ??? valACYclovir (VALTREX) 500 MG tablet Take 500 mg by mouth daily as needed.       No current facility-administered medications for this visit.        Physical Exam  BP 110/70 (BP Site: L Arm, BP Position: Sitting, BP Cuff Size: Medium)  - Pulse 68  - Temp 36.6 ??C (97.8 ??F) (Temporal)  - Ht 160 cm (5' 2.99)  - Wt 94.4 kg (208 lb 3.2 oz)  - LMP 09/04/2015  - BMI 36.89 kg/m??   General: Patient is a pleasant female in no apparent distress.  Eyes: Sclera anicteric.  Mouth: No obvious erythema or abscess.  ENT: Oropharynx without lesions.   Neck: Supple without LAD/JVD/bruits.  Lungs: Clear to auscultation bilaterally, no wheezes/rales/rhonchi.  Cardiovascular: Regular rate and rhythm without murmurs, rubs or gallops. Abdomen: Soft, notender/nondistended. Positive bowel sounds. No tenderness over the graft.    Extremities: Without edema, joints without evidence of synovitis. Right forearm AVF.  Skin: Without rash.  Neurological: Grossly nonfocal.  Psychiatric: Mood and affect appropriate.    Laboratory Results    Recent Results (from the past 170 hour(s))   CMV DNA, quantitative, PCR    Collection Time: 02/16/18  2:55 PM   Result Value Ref Range    CMV Viral Ld Not Detected Not Detected    CMV Quant  <0 IU/mL    CMV Quant  Log10  <0.00 log IU/mL    CMV Comment     Comprehensive Metabolic Panel    Collection Time: 02/16/18  2:55 PM   Result Value Ref Range    Sodium 135 135 - 145 mmol/L    Potassium 5.2 (H) 3.5 - 5.0 mmol/L    Chloride 105 98 - 107 mmol/L    CO2 23.0 22.0 - 30.0 mmol/L    Anion Gap 7 (L) 9 - 15 mmol/L    BUN 35 (H) 7 - 21 mg/dL    Creatinine 1.61 (H) 0.60 - 1.00 mg/dL    BUN/Creatinine Ratio 14     EGFR CKD-EPI Non-African American, Female 21 (L) >=60 mL/min/1.61m2    EGFR CKD-EPI African American, Female 25 (L) >=60 mL/min/1.21m2    Glucose 133 65 - 179 mg/dL    Calcium 09.6 8.5 - 04.5 mg/dL    Albumin 4.0 3.5 - 5.0 g/dL    Total Protein 7.2 6.5 - 8.3 g/dL    Total Bilirubin 0.3 0.0 - 1.2 mg/dL    AST 15 14 - 38 U/L    ALT 15 15 - 48 U/L    Alkaline Phosphatase 42 38 - 126 U/L   Magnesium Level    Collection Time: 02/16/18  2:55 PM   Result Value Ref Range    Magnesium 1.3 (L) 1.6 - 2.2 mg/dL   Phosphorus Level    Collection Time: 02/16/18  2:55 PM   Result Value Ref Range    Phosphorus 3.4 2.9 - 4.7 mg/dL   CBC w/ Differential    Collection Time: 02/16/18  2:55 PM   Result Value Ref Range    WBC 7.0 4.5 - 11.0 10*9/L    RBC 3.48 (L) 4.00 - 5.20 10*12/L    HGB 10.8 (L) 12.0 - 16.0 g/dL    HCT 40.9 (L) 81.1 - 46.0 %    MCV 95.5 80.0 - 100.0 fL    MCH 31.2 26.0 - 34.0 pg    MCHC 32.7 31.0 - 37.0 g/dL    RDW 91.4 78.2 - 95.6 %    MPV 8.3 7.0 - 10.0 fL    Platelet 232 150 - 440 10*9/L    Neutrophils % 68.3 % Lymphocytes % 21.2 %    Monocytes % 5.2 %    Eosinophils % 1.6 %    Basophils % 0.4 %    Absolute Neutrophils 4.8 2.0 - 7.5 10*9/L    Absolute Lymphocytes 1.5 1.5 - 5.0 10*9/L    Absolute Monocytes 0.4 0.2 - 0.8 10*9/L    Absolute Eosinophils 0.1 0.0 - 0.4 10*9/L    Absolute Basophils 0.0 0.0 - 0.1 10*9/L    Large Unstained Cells 3 0 - 4 %    Macrocytosis Slight (A) Not Present   Tacrolimus Level, Trough    Collection Time: 02/16/18  2:56 PM   Result Value Ref Range    Tacrolimus, Trough 6.2 5.0 - 15.0 ng/mL   Cytology-Urine    Collection Time: 02/16/18  3:31 PM   Result Value Ref Range    Case Report       Non-gynecologic Cytology                          Case: [id number]                                 Authorizing Provider:  Posey Boyer Vinay Ertl, MD       Collected:           02/16/2018 1531              Ordering Location:     [facility] Received:            02/17/2018 0956                                     [address] [place]                                                    Pathologist:           Sheran Lawless, MD                                                        Specimen:    Urine, Voided Decoy                                                                        Final Diagnosis       A: Urine, voided  No malignant cells identified  No decoy cells identified     This electronic signature is attestation that the pathologist personally reviewed the submitted material(s) and the final diagnosis reflects that evaluation.      Clinical History       Kidney replaced by transplant       Gross Description       30 mL clear yellow fluid, unfixed    Materials Prepared & Examined    Smear Slides.......0  Monolayers..........1  Cytospins.............0  Cell Blocks...........0  Core Biopsy.........0  Touch Prep..........0      Microscopic Description       Microscopic examination substantiates the above diagnosis.    Resident Physician: None Assigned      EMBEDDED IMAGES      Specimen Adequacy Satisfactory for evaluation     Disclaimer       Unless otherwise specified, specimens are preserved using 10% neutral buffered formalin. For cases in which immunohistochemical and/or in-situ hybridization stains are performed, the following statement applies: Appropriate controls for each stain (positive controls with or without negative controls) have been evaluated and stain as expected. These stains have not been separately validated for use on decalcified specimens and should be interpreted with caution in that setting. Some of the reagents used for these stains may be classified as analyte specific reagents (ASR). Tests using ASRs were developed, and their performance characteristics were determined, by the Anatomic Pathology Department [facility]). They have not been cleared or approved by the [place] Food and Drug Administration (FDA). The FDA does not require these tests to go through premarket FDA review. These tests are used for clinical purposes. They should  not be regarded as investigational or for research. This laboratory is certified under the Clinical Laboratory Improvement Amendments (CLIA) as qualified to perform high complexity clinical laboratory testing.     Protein/Creatinine Ratio, Urine    Collection Time: 02/16/18  3:31 PM   Result Value Ref Range    Creat U 105.3 Undefined mg/dL    Protein, Ur 7.1 Undefined mg/dL    Protein/Creatinine Ratio, Urine 0.067 Undefined   Urine Culture    Collection Time: 02/16/18  3:31 PM   Result Value Ref Range    Urine Culture, Comprehensive Streptococcus mitis group (A)    Urinalysis    Collection Time: 02/16/18  3:31 PM   Result Value Ref Range    Color, UA Yellow     Clarity, UA Clear     Specific Gravity, UA 1.011 1.003 - 1.030    pH, UA 6.0 5.0 - 9.0    Leukocyte Esterase, UA Negative Negative    Nitrite, UA Negative Negative    Protein, UA Negative Negative    Glucose, UA Negative Negative    Ketones, UA Negative Negative Urobilinogen, UA 0.2 mg/dL 0.2 mg/dL, 1.0 mg/dL    Bilirubin, UA Negative Negative    Blood, UA Negative Negative    RBC, UA <1 <=4 /HPF    WBC, UA <1 0 - 5 /HPF    Squam Epithel, UA <1 0 - 5 /HPF    Bacteria, UA Rare (A) None Seen /HPF    Hyaline Casts, UA 1 0 - 1 /LPF    Mucus, UA Few (A) None Seen /HPF         Assessment and Plan    1. Status post renal transplant.  Her creatinine is just above the upper limit of her baseline at 2.51, from 1.8 in March. Her Prograf trough is 6.2 and is a 17 hour trough. Her goal is 6-7, so this is likely high and may explain her elevated creatinine. Continue current immunosuppression for now, will have her repeat locally next week and get a good trough.    2. Breast cancer status post resection. On tamoxifen and tolerating that well.    3. History of hyperkalemia. Potassium only mildly elevated at 5.2 today. She has been educated on low potassium diet. Would not treat unless > 6.    4. Seasonal allergies. On Flonase nasal spay, instructed to take Claritin or Zyrtec if needed.     5. Metabolic acidosis. HCO3 normal on supplementation.     6. History of seizure disorder. Patient denies seizure activity and continues on Keppra.     7. Hypertension. Blood pressure controlled on current regimen.    8. Chronic lower back and leg pain. Stable.    9. Diabetes mellitus. Hgb A1c 6.9 in January. Followed elsewhere.     10. Elevated LDL of 103 in 08/2017. Not currently on a statin, but has been trending down. Will follow for now.    11. Lower right tooth pain. Oral exam without evidence of overt infection. Will prescribe 20 percocet to use for pain as tramadol not helping. She knows to avoid NSAIDs. Encouraged dental follow up.     12. Health maintenance. She received a flu shot this fall. Getting routine mammograms to follow up breast cancer. Will discuss Shingrix at her next visit.    13. Will see patient back in 6 months, or sooner if needed. Encouraged more regular monthly labs. Scribe's Attestation: Lisbeth Ply, MD obtained and performed the history, physical exam  and medical decision making elements that were entered into the chart. Signed by Welton Flakes, Scribe, on February 16, 2018 at 3:30 PM.    ----------------------------------------------------------------------------------------------------------------------  February 19, 2018 4:50 PM. Documentation assistance provided by the Scribe. I was present during the time the encounter was recorded. The information recorded by the Scribe was done at my direction and has been reviewed and validated by me.  ----------------------------------------------------------------------------------------------------------------------

## 2018-02-17 LAB — CMV DNA, QUANTITATIVE, PCR: CMV VIRAL LD: NOT DETECTED

## 2018-02-17 LAB — TACROLIMUS, TROUGH: Lab: 6.2

## 2018-02-17 LAB — CMV QUANT: Lab: 0

## 2018-02-17 NOTE — Unmapped (Addendum)
Addendum:  Package held at UPS but not picked up. Returned via UPS on 03/09/18. Package resent with [facility] for delivery on 03/10/18.  Marletta Lor, Pharmacist  [facility]  [phone]  ________________________________________________________________________________________________________________    [facility] Refill Coordination Note    Specialty Medication(s) to be Shipped:   Transplant: Myfortic 180mg  and Prograf 1mg     Other medication(s) to be shipped:       Nancy Bowman, DOB: 1965/11/01  Phone: [phone] (home)   Shipping Address: [address]  [place] [address]    All above HIPAA information was verified with patient.     Completed refill call assessment today to schedule patient's medication shipment from the [facility] [phone]).       Specialty medication(s) and dose(s) confirmed: Regimen is correct and unchanged.   Changes to medications: Nancy Bowman reports no changes reported at this time.  Changes to insurance: No  Questions for the pharmacist: No    The patient will receive an FSI print out for each medication shipped and additional FDA Medication Guides as required.  Patient education from [place] or Robet Leu may also be included in the shipment.    DISEASE/MEDICATION-SPECIFIC INFORMATION        N/A    ADHERENCE              MEDICARE PART B DOCUMENTATION         SHIPPING     Shipping address confirmed in FSI.     Delivery Scheduled: Yes, Expected medication delivery date: [phone] via UPS or courier.     Antonietta Barcelona   [facility] Specialty Technician

## 2018-02-22 MED FILL — MYFORTIC/180MG/TAB: MYFORTIC/180MG/TAB | 30 days supply | Qty: 180 | Fill #5

## 2018-02-22 MED FILL — PROGRAF/1MG/CAP: PROGRAF/1MG/CAP | 30 days supply | Qty: 240 | Fill #5

## 2018-02-22 NOTE — Unmapped (Signed)
Serim creatinine increased to 2.51. Per Dr. Carlene Coria, pt. Will hydrate and repeat labs in 1-2 weeks.

## 2018-02-22 NOTE — Unmapped (Signed)
Error

## 2018-03-04 ENCOUNTER — Other Ambulatory Visit
Admission: RE | Admit: 2018-03-04 | Discharge: 2018-03-04 | Disposition: A | Discharge status: Home / Self Care | Payer: Medicare Other | Source: Ambulatory Visit | Attending: Nephrology | Admitting: Nephrology

## 2018-03-04 DIAGNOSIS — B259 Cytomegaloviral disease, unspecified: Secondary | ICD-10-CM | POA: Diagnosis not present

## 2018-03-04 DIAGNOSIS — Z79899 Other long term (current) drug therapy: Secondary | ICD-10-CM | POA: Diagnosis not present

## 2018-03-04 DIAGNOSIS — N390 Urinary tract infection, site not specified: Secondary | ICD-10-CM | POA: Insufficient documentation

## 2018-03-04 DIAGNOSIS — E1129 Type 2 diabetes mellitus with other diabetic kidney complication: Secondary | ICD-10-CM | POA: Insufficient documentation

## 2018-03-04 DIAGNOSIS — Z9483 Pancreas transplant status: Secondary | ICD-10-CM | POA: Diagnosis not present

## 2018-03-04 DIAGNOSIS — Z789 Other specified health status: Secondary | ICD-10-CM | POA: Insufficient documentation

## 2018-03-04 DIAGNOSIS — D899 Disorder involving the immune mechanism, unspecified: Secondary | ICD-10-CM | POA: Insufficient documentation

## 2018-03-04 DIAGNOSIS — T8610 Unspecified complication of kidney transplant: Secondary | ICD-10-CM | POA: Diagnosis not present

## 2018-03-04 DIAGNOSIS — Z114 Encounter for screening for human immunodeficiency virus [HIV]: Secondary | ICD-10-CM | POA: Diagnosis not present

## 2018-03-04 DIAGNOSIS — Z940 Kidney transplant status: Secondary | ICD-10-CM | POA: Diagnosis not present

## 2018-03-04 DIAGNOSIS — D631 Anemia in chronic kidney disease: Secondary | ICD-10-CM | POA: Diagnosis not present

## 2018-03-04 LAB — CBC WITH DIFFERENTIAL/PLATELET
Basophils Absolute: 0.1 10*3/uL (ref 0–0.1)
Basophils Relative: 1 %
EOS ABS: 0.1 10*3/uL (ref 0–0.7)
EOS PCT: 2 %
HCT: 33.9 % — ABNORMAL LOW (ref 35.0–47.0)
Hemoglobin: 11.3 g/dL — ABNORMAL LOW (ref 12.0–16.0)
LYMPHS ABS: 1.1 10*3/uL (ref 1.0–3.6)
Lymphocytes Relative: 17 %
MCH: 31.8 pg (ref 26.0–34.0)
MCHC: 33.3 g/dL (ref 32.0–36.0)
MCV: 95.4 fL (ref 80.0–100.0)
MONO ABS: 0.4 10*3/uL (ref 0.2–0.9)
MONOS PCT: 6 %
Neutro Abs: 4.7 10*3/uL (ref 1.4–6.5)
Neutrophils Relative %: 74 %
PLATELETS: 242 10*3/uL (ref 150–440)
RBC: 3.55 MIL/uL — AB (ref 3.80–5.20)
RDW: 14.2 % (ref 11.5–14.5)
WBC: 6.3 10*3/uL (ref 3.6–11.0)

## 2018-03-04 LAB — BASIC METABOLIC PANEL
ANION GAP: 8 (ref 5–15)
BUN: 25 mg/dL — ABNORMAL HIGH (ref 6–20)
CALCIUM: 9.7 mg/dL (ref 8.9–10.3)
CO2: 22 mmol/L (ref 22–32)
CREATININE: 1.95 mg/dL — AB (ref 0.44–1.00)
Chloride: 105 mmol/L (ref 98–111)
GFR calc Af Amer: 33 mL/min — ABNORMAL LOW (ref >60)
GFR, EST NON AFRICAN AMERICAN: 29 mL/min — AB (ref >60)
GLUCOSE: 173 mg/dL — AB (ref 70–99)
Potassium: 4.9 mmol/L (ref 3.5–5.1)
Sodium: 135 mmol/L (ref 135–145)

## 2018-03-04 LAB — PHOSPHORUS: Phosphorus: 1.6 mg/dL — ABNORMAL LOW (ref 2.5–4.6)

## 2018-03-04 LAB — MAGNESIUM: Magnesium: 1.9 mg/dL (ref 1.7–2.4)

## 2018-03-06 LAB — TACROLIMUS LEVEL: TACROLIMUS (FK506) - LABCORP: 7.4 ng/mL (ref 2.0–20.0)

## 2018-03-07 NOTE — Unmapped (Signed)
Current Outpatient Medications on File Prior to Visit   Medication Sig   ??? acetaminophen (TYLENOL) 500 MG tablet Take 1,000 mg by mouth every four (4) hours as needed.    ??? aspirin 81 MG chewable tablet Chew 1 tablet (81 mg total) daily.   ??? carvedilol (COREG) 12.5 MG tablet Take 1 tablet (12.5 mg total) by mouth Two (2) times a day.   ??? cetirizine (ZYRTEC) 5 MG chewable tablet Chew 5 mg daily.   ??? cholecalciferol, vitamin D3, (VITAMIN D3) 1,000 unit capsule Take 1,000 Units by mouth daily.   ??? fluticasone (FLONASE) 50 mcg/actuation nasal spray Two sprays each nare twice a day.   ??? gabapentin (NEURONTIN) 300 MG capsule Take 1 capsules (300 mg) by mouth in the morning and at noon, 600 mg in the evening. (Patient taking differently: Take 300 mg by mouth Three (3) times a day. Take 1 capsules (300 mg) by mouth in the morning and at noon, 600 mg in the evening.)   ??? levETIRAcetam (KEPPRA) 500 MG tablet Take 1 tablet (500 mg total) by mouth Two (2) times a day.   ??? magnesium oxide (MAG-OX) 400 mg (241.3 mg magnesium) tablet Take 400 mg by mouth daily.   ??? metFORMIN (GLUCOPHAGE XR) 500 MG 24 hr tablet Take 1 tablet (500 mg total) by mouth daily.   ??? miscellaneous medical supply Misc 1 knee brace   ??? MYFORTIC 180 mg EC tablet Take 2 tablets (360 mg total) by mouth Three (3) times a day.   ??? omeprazole (PRILOSEC) 20 MG capsule Take 1 capsule (20 mg total) by mouth Two (2) times a day. (Patient taking differently: Take 20 mg by mouth two (2) times a day as needed. )   ??? oxyCODONE-acetaminophen (PERCOCET) 5-325 mg per tablet Take 1 tablet by mouth every four (4) hours as needed for pain.   ??? PROGRAF 1 mg capsule Take  4mg  in the am and 4 mg in the pm. Z94.0 kidney transplant, tx date 03/24/14, DAW1   ??? sodium bicarbonate 650 mg tablet Take 2 tablets (1300 mg) three times a day.   ??? tamoxifen (NOLVADEX) 20 MG tablet Take 1 tablet (20 mg total) by mouth daily.   ??? traMADol (ULTRAM) 50 mg tablet Take 1 tablet (50 mg total) by mouth every eight (8) hours as needed for pain.   ??? valACYclovir (VALTREX) 500 MG tablet Take 500 mg by mouth daily as needed.     No current facility-administered medications on file prior to visit.

## 2018-03-09 ENCOUNTER — Ambulatory Visit: Payer: Medicare Other | Admitting: Radiation Oncology

## 2018-03-11 NOTE — Unmapped (Signed)
Epic [facility]) medication reconciliation is completed.

## 2018-03-16 NOTE — Unmapped (Signed)
[facility] Refill and Clinical Coordination Note  Medication(s): MYFORTIC, PROGRAF    Nancy Bowman, DOB: July 31, 1966  Phone: [phone] (home) , Alternate phone contact: N/A  Shipping address: [address]  [place] [address]  Phone or address changes today?: No  All above HIPAA information verified.  Insurance changes? No    Completed refill and clinical call assessment today to schedule patient's medication shipment from the [facility] [phone]).      MEDICATION RECONCILIATION    Confirmed the medication and dosage are correct and have not changed: Yes, regimen is correct and unchanged.    Were there any changes to your medication(s) in the past month:  No, there are no changes reported at this time.    ADHERENCE    Is this medicine transplant or covered by Medicare Part B? Yes.    Prograf 1 mg   Quantity filled last month: 240   # of tablets left on hand: 12 DAYS LEFT      Myfortic 180 mg   Quantity filled last month: 180   # of tablets left on hand: 10 DAYS LEFT      Did you miss any doses in the past 4 weeks? No missed doses reported.  Adherence counseling provided? Not needed     SIDE EFFECT MANAGEMENT    Are you tolerating your medication?:  Nancy Bowman reports tolerating the medication.  Side effect management discussed: None      Therapy is appropriate and should be continued.    Evidence of clinical benefit: See Epic note from 02/16/18      FINANCIAL/SHIPPING    Delivery Scheduled: Yes, Expected medication delivery date: 03/23/18 VIA UPS     Additional medications refilled: No additional medications/refills needed at this time.    The patient will receive a drug information handout for each medication shipped and additional FDA Medication Guides as required.      Nancy Bowman did not have any additional questions at this time.    Delivery address confirmed in Epic.     We will follow up with patient monthly for standard refill processing and delivery. Thank you,  Thad Ranger   [facility] Specialty Pharmacist

## 2018-03-21 ENCOUNTER — Ambulatory Visit: Payer: Medicare Other | Admitting: Radiation Oncology

## 2018-03-24 ENCOUNTER — Ambulatory Visit: Payer: Medicare Other | Admitting: Radiation Oncology

## 2018-03-28 ENCOUNTER — Ambulatory Visit: Payer: Medicare Other | Admitting: Radiation Oncology

## 2018-03-30 MED ORDER — CARVEDILOL 12.5 MG TABLET
ORAL_TABLET | Freq: Two times a day (BID) | ORAL | 11 refills | 0 days | Status: CP
Start: 2018-03-30 — End: 2019-03-31

## 2018-04-08 MED FILL — PROGRAF 1 MG CAPSULE: 30 days supply | Qty: 240 | Fill #0 | Status: AC

## 2018-04-08 MED FILL — MYFORTIC 180 MG TABLET,DELAYED RELEASE: 30 days supply | Qty: 180 | Fill #0 | Status: AC

## 2018-04-08 MED FILL — PROGRAF 1 MG CAPSULE: 30 days supply | Qty: 240 | Fill #0

## 2018-04-08 MED FILL — MYFORTIC 180 MG TABLET,DELAYED RELEASE: 30 days supply | Qty: 180 | Fill #0

## 2018-04-15 NOTE — Unmapped (Signed)
Patient does not need a refill of specialty medication at this time. Moving specialty refill reminder call to appropriate date and removed call attempt data.  Spoke with patient and she states she just received her medications (Myfortic & Prograf) on Monday 04/11/2018 and does not need any refills at this time. Patient confirmed of not missing any doses.

## 2018-04-26 ENCOUNTER — Ambulatory Visit: Payer: Medicare Other | Admitting: Radiation Oncology

## 2018-04-26 ENCOUNTER — Telehealth: Payer: Self-pay | Admitting: *Deleted

## 2018-04-26 NOTE — Telephone Encounter (Signed)
Patient called to cancel her appointment for today.

## 2018-05-03 NOTE — Unmapped (Signed)
[facility] Refill Coordination Note    Specialty Medication(s) to be Shipped:   Transplant: Myfortic 180mg  and Prograf 1mg     Other medication(s) to be shipped: None     Nancy Bowman, DOB: [birth year]  Phone: [phone] (home)   Shipping Address: [address]  [place] [address]    All above HIPAA information was verified with patient.     Completed refill call assessment today to schedule patient's medication shipment from the [facility] [phone]).       Specialty medication(s) and dose(s) confirmed: Regimen is correct and unchanged.   Changes to medications: Nancy Bowman reports no changes reported at this time.  Changes to insurance: No  Questions for the pharmacist: No    The patient will receive a drug information handout for each medication shipped and additional FDA Medication Guides as required.      DISEASE/MEDICATION-SPECIFIC INFORMATION        N/A    ADHERENCE     Medication Adherence    Patient reported X missed doses in the last month:  0              MEDICARE PART B DOCUMENTATION     Myfortic 180mg : Patient has 7 days worth of tablets on hand.  Prograf 1mg : Patient has 7 days worth of capsules on hand.    SHIPPING     Shipping address confirmed in Epic.     Delivery Scheduled: Yes, Expected medication delivery date: 05/05/18 via UPS or courier.     Nancy Bowman   [facility] Shared [facility] Specialty Technician

## 2018-05-04 MED FILL — PROGRAF 1 MG CAPSULE: 30 days supply | Qty: 240 | Fill #1 | Status: AC

## 2018-05-04 MED FILL — MYFORTIC 180 MG TABLET,DELAYED RELEASE: 30 days supply | Qty: 180 | Fill #1 | Status: AC

## 2018-05-04 MED FILL — PROGRAF 1 MG CAPSULE: 30 days supply | Qty: 240 | Fill #1

## 2018-05-04 MED FILL — MYFORTIC 180 MG TABLET,DELAYED RELEASE: 30 days supply | Qty: 180 | Fill #1

## 2018-05-24 NOTE — Unmapped (Signed)
[facility] Refill Coordination Note    Specialty Medication(s) to be Shipped:   Transplant: Myfortic 180mg  and Prograf 1mg      Nancy Bowman, DOB: 1966/06/04  Phone: [phone] (home)       All above HIPAA information was verified with patient.     Completed refill call assessment today to schedule patient's medication shipment from the [facility] [phone]).       Specialty medication(s) and dose(s) confirmed: Regimen is correct and unchanged.   Changes to medications: Phillipa reports no changes reported at this time.  Changes to insurance: No  Questions for the pharmacist: No    The patient will receive a drug information handout for each medication shipped and additional FDA Medication Guides as required.      DISEASE/MEDICATION-SPECIFIC INFORMATION        N/A    ADHERENCE     Medication Adherence    Patient reported X missed doses in the last month:  0  Specialty Medication:  Myfortic 180mg  & Prograf 1mg   Patient is on additional specialty medications:  No  Patient is on more than two specialty medications:  No  Any gaps in refill history greater than 2 weeks in the last 3 months:  no  Demonstrates understanding of importance of adherence:  yes  Informant:  patient  Reliability of informant:  reliable      Adherence tools used:  patient uses a pill box to manage medications          Confirmed plan for next specialty medication refill:  delivery by pharmacy          Refill Coordination    Has the Patients' Contact Information Changed:  No  Is the Shipping Address Different:  No         MEDICARE PART B DOCUMENTATION     Myfortic 180mg : Patient has 10 days worth of tablets on hand.  Prograf 1mg : Patient has 10 days worth of capsules on hand.    SHIPPING     Shipping address confirmed in Epic.     Delivery Scheduled: Yes, Expected medication delivery date: 06/02/2018 via UPS or courier.     Medication will be delivered via UPS to the home address in [place]. Enzo Treu P Allena Katz   [facility] Specialty Technician

## 2018-06-01 NOTE — Unmapped (Signed)
Due to system errors at the [facility] - delivery of this medication will be delayed until 06/03/18.  The patient has been contacted and made aware of this delay and stated they have no issues with delivery on 06/03/18.

## 2018-06-06 ENCOUNTER — Encounter: Admit: 2018-06-06 | Discharge: 2018-06-06 | Payer: MEDICARE | Attending: Nephrology | Primary: Nephrology

## 2018-06-06 ENCOUNTER — Other Ambulatory Visit
Admission: RE | Admit: 2018-06-06 | Discharge: 2018-06-06 | Disposition: A | Discharge status: Home / Self Care | Payer: Medicare Other | Source: Ambulatory Visit | Attending: Nephrology | Admitting: Nephrology

## 2018-06-06 DIAGNOSIS — N390 Urinary tract infection, site not specified: Secondary | ICD-10-CM | POA: Insufficient documentation

## 2018-06-06 DIAGNOSIS — Z9483 Pancreas transplant status: Secondary | ICD-10-CM | POA: Diagnosis not present

## 2018-06-06 DIAGNOSIS — Z940 Kidney transplant status: Secondary | ICD-10-CM | POA: Insufficient documentation

## 2018-06-06 DIAGNOSIS — D631 Anemia in chronic kidney disease: Secondary | ICD-10-CM | POA: Diagnosis not present

## 2018-06-06 DIAGNOSIS — E1129 Type 2 diabetes mellitus with other diabetic kidney complication: Secondary | ICD-10-CM | POA: Insufficient documentation

## 2018-06-06 DIAGNOSIS — E559 Vitamin D deficiency, unspecified: Secondary | ICD-10-CM | POA: Diagnosis not present

## 2018-06-06 DIAGNOSIS — N189 Chronic kidney disease, unspecified: Secondary | ICD-10-CM | POA: Insufficient documentation

## 2018-06-06 DIAGNOSIS — Z79899 Other long term (current) drug therapy: Secondary | ICD-10-CM | POA: Diagnosis not present

## 2018-06-06 DIAGNOSIS — D899 Disorder involving the immune mechanism, unspecified: Secondary | ICD-10-CM | POA: Insufficient documentation

## 2018-06-06 DIAGNOSIS — B259 Cytomegaloviral disease, unspecified: Secondary | ICD-10-CM | POA: Insufficient documentation

## 2018-06-06 DIAGNOSIS — Z789 Other specified health status: Secondary | ICD-10-CM | POA: Diagnosis not present

## 2018-06-06 LAB — BASIC METABOLIC PANEL
Anion gap: 9 (ref 5–15)
BUN: 38 mg/dL — AB (ref 6–20)
CHLORIDE: 104 mmol/L (ref 98–111)
CO2: 21 mmol/L — AB (ref 22–32)
Calcium: 9.7 mg/dL (ref 8.9–10.3)
Creatinine, Ser: 2.78 mg/dL — ABNORMAL HIGH (ref 0.44–1.00)
GFR calc Af Amer: 22 mL/min — ABNORMAL LOW (ref >60)
GFR, EST NON AFRICAN AMERICAN: 19 mL/min — AB (ref >60)
GLUCOSE: 108 mg/dL — AB (ref 70–99)
Potassium: 4.7 mmol/L (ref 3.5–5.1)
Sodium: 134 mmol/L — ABNORMAL LOW (ref 135–145)

## 2018-06-06 LAB — CBC WITH DIFFERENTIAL/PLATELET
ABS IMMATURE GRANULOCYTES: 0.02 10*3/uL (ref 0.00–0.07)
BASOS PCT: 1 %
Basophils Absolute: 0 10*3/uL (ref 0.0–0.1)
Eosinophils Absolute: 0.1 10*3/uL (ref 0.0–0.5)
Eosinophils Relative: 2 %
HEMATOCRIT: 35.5 % — AB (ref 36.0–46.0)
HEMOGLOBIN: 11.2 g/dL — AB (ref 12.0–15.0)
IMMATURE GRANULOCYTES: 0 %
LYMPHS ABS: 1.4 10*3/uL (ref 0.7–4.0)
LYMPHS PCT: 20 %
MCH: 30.9 pg (ref 26.0–34.0)
MCHC: 31.5 g/dL (ref 30.0–36.0)
MCV: 98.1 fL (ref 80.0–100.0)
MONO ABS: 0.6 10*3/uL (ref 0.1–1.0)
MONOS PCT: 8 %
NEUTROS ABS: 4.7 10*3/uL (ref 1.7–7.7)
NEUTROS PCT: 69 %
PLATELETS: 239 10*3/uL (ref 150–400)
RBC: 3.62 MIL/uL — ABNORMAL LOW (ref 3.87–5.11)
RDW: 13.4 % (ref 11.5–15.5)
WBC: 6.8 10*3/uL (ref 4.0–10.5)
nRBC: 0 % (ref 0.0–0.2)

## 2018-06-06 LAB — MAGNESIUM: MAGNESIUM: 2.1 mg/dL (ref 1.7–2.4)

## 2018-06-06 LAB — PHOSPHORUS: Phosphorus: 3.4 mg/dL (ref 2.5–4.6)

## 2018-06-13 MED FILL — MYFORTIC 180 MG TABLET,DELAYED RELEASE: 30 days supply | Qty: 180 | Fill #2

## 2018-06-13 MED FILL — PROGRAF 1 MG CAPSULE: 30 days supply | Qty: 240 | Fill #2 | Status: AC

## 2018-06-13 MED FILL — MYFORTIC 180 MG TABLET,DELAYED RELEASE: 30 days supply | Qty: 180 | Fill #2 | Status: AC

## 2018-06-13 MED FILL — PROGRAF 1 MG CAPSULE: 30 days supply | Qty: 240 | Fill #2

## 2018-06-13 NOTE — Unmapped (Signed)
Resending myfortic and prograf because the package was lost . Delivery is scheduled for 06/14/18. The patient has been contact and is aware of the change.

## 2018-06-14 DIAGNOSIS — Z79899 Other long term (current) drug therapy: Secondary | ICD-10-CM

## 2018-06-14 DIAGNOSIS — Z940 Kidney transplant status: Principal | ICD-10-CM

## 2018-06-15 LAB — TACROLIMUS, TROUGH: Lab: 7.2

## 2018-06-15 MED ORDER — OMEPRAZOLE 20 MG CAPSULE,DELAYED RELEASE
ORAL_CAPSULE | 0 refills | 0 days | Status: CP
Start: 2018-06-15 — End: 2018-06-20

## 2018-06-20 MED ORDER — OMEPRAZOLE 20 MG CAPSULE,DELAYED RELEASE
ORAL_CAPSULE | Freq: Two times a day (BID) | ORAL | 3 refills | 0 days | Status: CP
Start: 2018-06-20 — End: ?

## 2018-06-23 NOTE — Unmapped (Signed)
Patient does not need a refill of specialty medication at this time. Moving specialty refill reminder call to appropriate date and removed call attempt data.  Spoke with patient and she had not received the previous shipment from the 06/02/18, but the second shipment that was sent on out on 06/13/18 was received by her on 06/15/2018. So patient states she still has enough on hand of the Myfortic and Prograf and she does not need any refills at this time.

## 2018-06-24 ENCOUNTER — Other Ambulatory Visit: Payer: Self-pay | Admitting: Family Medicine

## 2018-06-24 NOTE — Telephone Encounter (Signed)
Requested medication (s) are due for refill today: yes  Requested medication (s) are on the active medication list: yes    Last refill: 06/03/17  #20 12 refills  Future visit scheduled yes  Notes to clinic:Pt has appt 07/04/18  Requested Prescriptions  Pending Prescriptions Disp Refills   valACYclovir (VALTREX) 1000 MG tablet [Pharmacy Med Name: VALACYCLOVIR HCL 1 GRAM TABLET] 20 tablet 0    Sig: Take 1 tablet (1,000 mg total) by mouth 2 (two) times daily.     Antimicrobials:  Antiviral Agents - Anti-Herpetic Passed - 06/24/2018  3:04 PM      Passed - Valid encounter within last 12 months    Recent Outpatient Visits          4 months ago Blood in stool   [facility], [place], [place]   5 months ago Type 2 diabetes mellitus with stage 3 chronic kidney disease, without long-term current use of insulin [facility])   [facility] Terrilee Croak, Adriana M, PA-C   9 months ago Joint swelling   [place], Megan P, DO   10 months ago Diabetes mellitus type 2, diet-controlled ([place])   [place], Megan P, DO   11 months ago Upper respiratory tract infection, unspecified type   [facility] [place], Barb Merino, DO      Future Appointments            In 1 week Wynetta Emery, Barb Merino, DO [facility], [facility]

## 2018-07-04 ENCOUNTER — Ambulatory Visit: Payer: Medicare Other | Admitting: Family Medicine

## 2018-07-06 MED FILL — MYFORTIC 180 MG TABLET,DELAYED RELEASE: 30 days supply | Qty: 180 | Fill #3 | Status: AC

## 2018-07-06 MED FILL — MYFORTIC 180 MG TABLET,DELAYED RELEASE: 30 days supply | Qty: 180 | Fill #3

## 2018-07-06 MED FILL — PROGRAF 1 MG CAPSULE: 30 days supply | Qty: 240 | Fill #3

## 2018-07-06 MED FILL — PROGRAF 1 MG CAPSULE: 30 days supply | Qty: 240 | Fill #3 | Status: AC

## 2018-07-06 NOTE — Unmapped (Signed)
[facility] Refill and Clinical Coordination Note  Medication(s): Myfortic, Prograf    Nancy Bowman, DOB: 10/22/1965  Phone: [phone] (home) , Alternate phone contact: N/A  Shipping address: [address]  [place] [address]  Phone or address changes today?: No  All above HIPAA information verified.  Insurance changes? No    Completed refill and clinical call assessment today to schedule patient's medication shipment from the [facility] [phone]).      MEDICATION RECONCILIATION    Confirmed the medication and dosage are correct and have not changed: Yes, regimen is correct and unchanged.    Were there any changes to your medication(s) in the past month:  No, there are no changes reported at this time.    ADHERENCE    Is this medicine transplant or covered by Medicare Part B? Yes.    Myfortic 180mg : Patient has 3 days worth of tablets on hand.  Prograf 1mg : Patient has 3 days worth of capsules on hand.    Did you miss any doses in the past 4 weeks? No missed doses reported.  Adherence counseling provided? Not needed     SIDE EFFECT MANAGEMENT    Are you tolerating your medication?:  Nancy Bowman reports tolerating the medication.  Side effect management discussed: None      Therapy is appropriate and should be continued.    Evidence of clinical benefit: See Epic note from 02/16/18      FINANCIAL/SHIPPING    Delivery Scheduled: Yes, Expected medication delivery date: 07/07/18     Medication will be delivered via UPS to the home address in [facility] [place] [facility].    Additional medications refilled: No additional medications/refills needed at this time.    The patient will receive a drug information handout for each medication shipped and additional FDA Medication Guides as required.      Nancy Bowman did not have any additional questions at this time.    Delivery address confirmed in Epic.     We will follow up with patient monthly for standard refill processing and delivery. Thank you,  [name]   [facility] Specialty Pharmacist

## 2018-07-29 NOTE — Unmapped (Signed)
[facility] Refill Coordination Note    Specialty Medication(s) to be Shipped:   Transplant: Myfortic 180mg  and Prograf 1mg     Other medication(s) to be shipped: none     Nancy Bowman, DOB: 1965-08-18  Phone: [phone] (home)       All above HIPAA information was verified with patient.     Completed refill call assessment today to schedule patient's medication shipment from the [facility] [phone]).       Specialty medication(s) and dose(s) confirmed: Regimen is correct and unchanged.   Changes to medications: Nancy Bowman reports no changes reported at this time.  Changes to insurance: No  Questions for the pharmacist: No    The patient will receive a drug information handout for each medication shipped and additional FDA Medication Guides as required.      DISEASE/MEDICATION-SPECIFIC INFORMATION        N/A    ADHERENCE     Medication Adherence    Patient reported X missed doses in the last month:  0      Adherence tools used:  patient uses a pill box to manage medications                      MEDICARE PART B DOCUMENTATION     Myfortic 180mg : Patient has 8 days worth of tablets on hand.  Prograf 1mg : Patient has 8 days worth of capsules on hand.    SHIPPING     Shipping address confirmed in Epic.     Delivery Scheduled: Yes, Expected medication delivery date: 08/02/18 via UPS or courier.     Medication will be delivered via UPS to the home address in [place].    Nancy Bowman   [facility] Specialty Technician

## 2018-08-01 MED FILL — MYFORTIC 180 MG TABLET,DELAYED RELEASE: 30 days supply | Qty: 180 | Fill #4 | Status: AC

## 2018-08-01 MED FILL — PROGRAF 1 MG CAPSULE: 30 days supply | Qty: 240 | Fill #4 | Status: AC

## 2018-08-01 MED FILL — MYFORTIC 180 MG TABLET,DELAYED RELEASE: 30 days supply | Qty: 180 | Fill #4

## 2018-08-01 MED FILL — PROGRAF 1 MG CAPSULE: 30 days supply | Qty: 240 | Fill #4

## 2018-08-02 NOTE — Unmapped (Signed)
Hi Darl Pikes,    We are attempting to schedule patient Malai Lady for a mammo.  An order is needed to proceed with scheduling the appointment.      Please place an order and provide updates when complete so our team can finalize the scheduling request.    Thank you,   Christell Faith  [facility]   [phone]

## 2018-08-08 ENCOUNTER — Encounter: Payer: Self-pay | Admitting: Emergency Medicine

## 2018-08-08 ENCOUNTER — Other Ambulatory Visit: Payer: Self-pay

## 2018-08-08 ENCOUNTER — Inpatient Hospital Stay
Admission: EM | Admit: 2018-08-08 | Discharge: 2018-08-09 | DRG: 313 | Disposition: A | Discharge status: Home / Self Care | Payer: Medicare Other | Attending: Internal Medicine | Admitting: Internal Medicine

## 2018-08-08 ENCOUNTER — Emergency Department: Payer: Medicare Other

## 2018-08-08 DIAGNOSIS — R0789 Other chest pain: Secondary | ICD-10-CM | POA: Diagnosis present

## 2018-08-08 DIAGNOSIS — I120 Hypertensive chronic kidney disease with stage 5 chronic kidney disease or end stage renal disease: Secondary | ICD-10-CM | POA: Diagnosis present

## 2018-08-08 DIAGNOSIS — E1122 Type 2 diabetes mellitus with diabetic chronic kidney disease: Secondary | ICD-10-CM | POA: Diagnosis present

## 2018-08-08 DIAGNOSIS — I471 Supraventricular tachycardia: Secondary | ICD-10-CM | POA: Diagnosis not present

## 2018-08-08 DIAGNOSIS — Z23 Encounter for immunization: Secondary | ICD-10-CM

## 2018-08-08 DIAGNOSIS — H35033 Hypertensive retinopathy, bilateral: Secondary | ICD-10-CM | POA: Diagnosis not present

## 2018-08-08 DIAGNOSIS — Z853 Personal history of malignant neoplasm of breast: Secondary | ICD-10-CM

## 2018-08-08 DIAGNOSIS — Z7984 Long term (current) use of oral hypoglycemic drugs: Secondary | ICD-10-CM

## 2018-08-08 DIAGNOSIS — I340 Nonrheumatic mitral (valve) insufficiency: Secondary | ICD-10-CM | POA: Diagnosis not present

## 2018-08-08 DIAGNOSIS — R079 Chest pain, unspecified: Secondary | ICD-10-CM | POA: Diagnosis present

## 2018-08-08 DIAGNOSIS — Z880 Allergy status to penicillin: Secondary | ICD-10-CM

## 2018-08-08 DIAGNOSIS — Z888 Allergy status to other drugs, medicaments and biological substances status: Secondary | ICD-10-CM

## 2018-08-08 DIAGNOSIS — G40909 Epilepsy, unspecified, not intractable, without status epilepticus: Secondary | ICD-10-CM | POA: Diagnosis not present

## 2018-08-08 DIAGNOSIS — R002 Palpitations: Secondary | ICD-10-CM | POA: Diagnosis not present

## 2018-08-08 DIAGNOSIS — Z8349 Family history of other endocrine, nutritional and metabolic diseases: Secondary | ICD-10-CM

## 2018-08-08 DIAGNOSIS — F419 Anxiety disorder, unspecified: Secondary | ICD-10-CM | POA: Diagnosis not present

## 2018-08-08 DIAGNOSIS — Z841 Family history of disorders of kidney and ureter: Secondary | ICD-10-CM

## 2018-08-08 DIAGNOSIS — K219 Gastro-esophageal reflux disease without esophagitis: Secondary | ICD-10-CM | POA: Diagnosis not present

## 2018-08-08 DIAGNOSIS — Z8249 Family history of ischemic heart disease and other diseases of the circulatory system: Secondary | ICD-10-CM

## 2018-08-08 DIAGNOSIS — Z940 Kidney transplant status: Secondary | ICD-10-CM

## 2018-08-08 DIAGNOSIS — N186 End stage renal disease: Secondary | ICD-10-CM | POA: Diagnosis not present

## 2018-08-08 DIAGNOSIS — G2581 Restless legs syndrome: Secondary | ICD-10-CM | POA: Diagnosis present

## 2018-08-08 DIAGNOSIS — E119 Type 2 diabetes mellitus without complications: Secondary | ICD-10-CM | POA: Diagnosis not present

## 2018-08-08 DIAGNOSIS — Z8614 Personal history of Methicillin resistant Staphylococcus aureus infection: Secondary | ICD-10-CM | POA: Diagnosis not present

## 2018-08-08 DIAGNOSIS — N183 Chronic kidney disease, stage 3 (moderate): Secondary | ICD-10-CM | POA: Diagnosis not present

## 2018-08-08 DIAGNOSIS — Z87891 Personal history of nicotine dependence: Secondary | ICD-10-CM

## 2018-08-08 DIAGNOSIS — I10 Essential (primary) hypertension: Secondary | ICD-10-CM | POA: Diagnosis not present

## 2018-08-08 DIAGNOSIS — Z7982 Long term (current) use of aspirin: Secondary | ICD-10-CM

## 2018-08-08 DIAGNOSIS — Z833 Family history of diabetes mellitus: Secondary | ICD-10-CM

## 2018-08-08 DIAGNOSIS — Z886 Allergy status to analgesic agent status: Secondary | ICD-10-CM

## 2018-08-08 DIAGNOSIS — Z7951 Long term (current) use of inhaled steroids: Secondary | ICD-10-CM

## 2018-08-08 DIAGNOSIS — Z8673 Personal history of transient ischemic attack (TIA), and cerebral infarction without residual deficits: Secondary | ICD-10-CM | POA: Diagnosis not present

## 2018-08-08 DIAGNOSIS — Z79899 Other long term (current) drug therapy: Secondary | ICD-10-CM

## 2018-08-08 HISTORY — DX: Type 2 diabetes mellitus without complications: E11.9

## 2018-08-08 LAB — CBC
HCT: 37.2 % (ref 36.0–46.0)
HEMOGLOBIN: 11.9 g/dL — AB (ref 12.0–15.0)
MCH: 30.7 pg (ref 26.0–34.0)
MCHC: 32 g/dL (ref 30.0–36.0)
MCV: 96.1 fL (ref 80.0–100.0)
Platelets: 220 10*3/uL (ref 150–400)
RBC: 3.87 MIL/uL (ref 3.87–5.11)
RDW: 12.4 % (ref 11.5–15.5)
WBC: 6.6 10*3/uL (ref 4.0–10.5)
nRBC: 0 % (ref 0.0–0.2)

## 2018-08-08 LAB — BASIC METABOLIC PANEL
ANION GAP: 9 (ref 5–15)
BUN: 35 mg/dL — AB (ref 6–20)
CO2: 20 mmol/L — AB (ref 22–32)
Calcium: 10.6 mg/dL — ABNORMAL HIGH (ref 8.9–10.3)
Chloride: 106 mmol/L (ref 98–111)
Creatinine, Ser: 2.27 mg/dL — ABNORMAL HIGH (ref 0.44–1.00)
GFR calc Af Amer: 28 mL/min — ABNORMAL LOW (ref >60)
GFR, EST NON AFRICAN AMERICAN: 24 mL/min — AB (ref >60)
GLUCOSE: 170 mg/dL — AB (ref 70–99)
POTASSIUM: 4.7 mmol/L (ref 3.5–5.1)
Sodium: 135 mmol/L (ref 135–145)

## 2018-08-08 LAB — GLUCOSE, CAPILLARY
GLUCOSE-CAPILLARY: 142 mg/dL — AB (ref 70–99)
Glucose-Capillary: 150 mg/dL — ABNORMAL HIGH (ref 70–99)

## 2018-08-08 LAB — PROTIME-INR
INR: 1.01
Prothrombin Time: 13.2 seconds (ref 11.4–15.2)

## 2018-08-08 LAB — APTT
aPTT: 28 seconds (ref 24–36)
aPTT: 28 seconds (ref 24–36)

## 2018-08-08 LAB — TROPONIN I: Troponin I: <0.03 ng/mL (ref <0.03)

## 2018-08-08 MED ORDER — HEPARIN (PORCINE) 25000 UT/250ML-% IV SOLN
1000.0000 [IU]/h | INTRAVENOUS | Status: DC
  Filled 2018-08-08: qty 250

## 2018-08-08 MED ORDER — LEVETIRACETAM 500 MG PO TABS
500.0000 mg | ORAL_TABLET | Freq: Two times a day (BID) | ORAL | Status: DC
  Administered 2018-08-08 – 2018-08-09 (×2): 500 mg via ORAL
  Filled 2018-08-08 (×2): qty 1

## 2018-08-08 MED ORDER — NITROGLYCERIN 0.4 MG SL SUBL: 0.4000 mg | SUBLINGUAL_TABLET | SUBLINGUAL | Status: DC | PRN

## 2018-08-08 MED ORDER — ONDANSETRON HCL 4 MG/2ML IJ SOLN: 4.0000 mg | Freq: Four times a day (QID) | INTRAMUSCULAR | Status: DC | PRN

## 2018-08-08 MED ORDER — HEPARIN BOLUS VIA INFUSION
4000.0000 [IU] | Freq: Once | INTRAVENOUS | Status: DC
  Filled 2018-08-08: qty 4000

## 2018-08-08 MED ORDER — GABAPENTIN 300 MG PO CAPS
600.0000 mg | ORAL_CAPSULE | Freq: Every day | ORAL | Status: DC
  Administered 2018-08-08: 600 mg via ORAL
  Filled 2018-08-08: qty 2

## 2018-08-08 MED ORDER — GABAPENTIN 300 MG PO CAPS
300.0000 mg | ORAL_CAPSULE | Freq: Two times a day (BID) | ORAL | Status: DC
  Administered 2018-08-09 (×2): 300 mg via ORAL
  Filled 2018-08-08 (×2): qty 1

## 2018-08-08 MED ORDER — ACETAMINOPHEN 325 MG PO TABS
650.0000 mg | ORAL_TABLET | ORAL | Status: DC | PRN
  Administered 2018-08-08 – 2018-08-09 (×2): 650 mg via ORAL
  Filled 2018-08-08 (×2): qty 2

## 2018-08-08 MED ORDER — ASPIRIN 81 MG PO CHEW
324.0000 mg | CHEWABLE_TABLET | Freq: Once | ORAL | Status: AC
  Administered 2018-08-08: 324 mg via ORAL
  Filled 2018-08-08: qty 4

## 2018-08-08 MED ORDER — ASPIRIN EC 81 MG PO TBEC
81.0000 mg | DELAYED_RELEASE_TABLET | Freq: Every day | ORAL | Status: DC
  Administered 2018-08-09: 81 mg via ORAL
  Filled 2018-08-08: qty 1

## 2018-08-08 MED ORDER — INSULIN ASPART 100 UNIT/ML ~~LOC~~ SOLN
0.0000 [IU] | Freq: Three times a day (TID) | SUBCUTANEOUS | Status: DC
  Administered 2018-08-09 (×2): 1 [IU] via SUBCUTANEOUS
  Administered 2018-08-09: 2 [IU] via SUBCUTANEOUS
  Filled 2018-08-08 (×3): qty 1

## 2018-08-08 MED ORDER — CARVEDILOL 12.5 MG PO TABS
12.5000 mg | ORAL_TABLET | Freq: Two times a day (BID) | ORAL | Status: DC
  Administered 2018-08-09 (×2): 12.5 mg via ORAL
  Filled 2018-08-08 (×2): qty 1

## 2018-08-08 MED ORDER — PNEUMOCOCCAL VAC POLYVALENT 25 MCG/0.5ML IJ INJ
0.5000 mL | INJECTION | INTRAMUSCULAR | Status: AC
  Administered 2018-08-09: 0.5 mL via INTRAMUSCULAR
  Filled 2018-08-08: qty 0.5

## 2018-08-08 MED ORDER — GABAPENTIN 300 MG PO CAPS: 300.0000 mg | ORAL_CAPSULE | Freq: Three times a day (TID) | ORAL | Status: DC

## 2018-08-08 MED ORDER — FLUTICASONE PROPIONATE 50 MCG/ACT NA SUSP
1.0000 | Freq: Every day | NASAL | Status: DC
  Administered 2018-08-09: 1 via NASAL
  Filled 2018-08-08: qty 16

## 2018-08-08 MED ORDER — MYCOPHENOLATE SODIUM 180 MG PO TBEC
180.0000 mg | DELAYED_RELEASE_TABLET | Freq: Two times a day (BID) | ORAL | Status: DC
  Administered 2018-08-08 – 2018-08-09 (×2): 180 mg via ORAL
  Filled 2018-08-08 (×3): qty 1

## 2018-08-08 MED ORDER — SIMVASTATIN 20 MG PO TABS
40.0000 mg | ORAL_TABLET | Freq: Every day | ORAL | Status: DC
  Administered 2018-08-08 – 2018-08-09 (×2): 40 mg via ORAL
  Filled 2018-08-08 (×2): qty 2

## 2018-08-08 MED ORDER — ARGATROBAN 50 MG/50ML IV SOLN
1.0000 ug/kg/min | INTRAVENOUS | Status: DC
  Filled 2018-08-08: qty 50

## 2018-08-08 MED ORDER — TACROLIMUS 1 MG PO CAPS
4.0000 mg | ORAL_CAPSULE | Freq: Two times a day (BID) | ORAL | Status: DC
  Administered 2018-08-08 – 2018-08-09 (×2): 4 mg via ORAL
  Filled 2018-08-08 (×3): qty 4

## 2018-08-08 MED ORDER — NITROGLYCERIN 2 % TD OINT: 0.5000 [in_us] | TOPICAL_OINTMENT | Freq: Four times a day (QID) | TRANSDERMAL | Status: DC

## 2018-08-08 MED ORDER — SODIUM BICARBONATE 650 MG PO TABS
650.0000 mg | ORAL_TABLET | Freq: Three times a day (TID) | ORAL | Status: DC
  Administered 2018-08-08 – 2018-08-09 (×3): 650 mg via ORAL
  Filled 2018-08-08 (×3): qty 1

## 2018-08-08 MED ORDER — ASPIRIN EC 81 MG PO TBEC: 81.0000 mg | DELAYED_RELEASE_TABLET | Freq: Every day | ORAL | Status: DC

## 2018-08-08 MED ORDER — TAMOXIFEN CITRATE 10 MG PO TABS
20.0000 mg | ORAL_TABLET | Freq: Every day | ORAL | Status: DC
  Administered 2018-08-09: 20 mg via ORAL
  Filled 2018-08-08: qty 2

## 2018-08-08 NOTE — ED Notes (Signed)
Call to 2A 

## 2018-08-08 NOTE — ED Notes (Signed)
Pt is on monitor and on zoll pads.  Pt states that she has been having palpitations.  Pt states that she is feeling a little better at this time.  Pt on monitor.  Will continue to monitor.

## 2018-08-08 NOTE — Progress Notes (Signed)
Patient admitted  to room 257 with the diagnosis of chest pain. Alert and oriented x 4. Full assessment to epic completed. Medicated her with Tylenol 650 mg oral as needed for c/o headache. Refused her NTG past, stated she's chest pain free. No issues noted. Will continue to monitor.

## 2018-08-08 NOTE — Progress Notes (Signed)
Discussed with Dr. Fletcher Anon, recommends argatroban infusion only if troponins are positive.  So I discontinued argatroban infusion..  Patient second troponin also is negative.

## 2018-08-08 NOTE — ED Notes (Signed)
Call to 2A to give report.

## 2018-08-08 NOTE — Progress Notes (Deleted)
ANTICOAGULATION CONSULT NOTE - Initial Consult  Pharmacy Consult for heparin  Indication: chest pain/ACS  Allergies  Allergen Reactions  . Heparin     "i bled for a long time and had to have 2 blood transfusions"  . Ibuprofen     Caused kidney damage Can't take due to kidneys  . Other     Other reaction(s): Other (See Comments) Causing HITT  . Penicillins     Childhood reaction Patient is unsure if she's allergic, was told she had a reaction as a child but thinks she's taking a "cillin" while sick without having a reaction.  Pt states she has taken amoxicillin in the past and did not have problem    Patient Measurements:   Heparin Dosing Weight: 77.36 kg   Vital Signs: Temp: 98.9 F (37.2 C) (01/06 1604) Temp Source: Oral (01/06 1604) BP: 124/97 (01/06 1700) Pulse Rate: 75 (01/06 1700)  Labs: Recent Labs    08/08/18 1610  HGB 11.9*  HCT 37.2  PLT 220  CREATININE 2.27*  TROPONINI <0.03    CrCl cannot be calculated (Unknown ideal weight.).   Medical History: Past Medical History:  Diagnosis Date  . Arrhythmia   . Cerebrovascular accident [facility]) 2010   without any focal neurological deficits.   . Clotted renal dialysis AV graft ([place])   . 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 Diabetes mellitus without complication ([place])   . Ductal carcinoma in situ (DCIS) of left breast   . End stage renal disease ([place])    a. previously on dialysis on Tues, Thurs, & Oct 31, 2022 x 7 yrs; b. s/p deceased donor transplant 31-Mar-2014   . GERD (gastroesophageal reflux disease)   . History of methicillin resistant Staphylococcus aureus infection   . History of stress test    a. 04/2013: normal, EF 65%  . HIT (heparin-induced thrombocytopenia) ([place])   . Hypertension   . Mitral regurgitation    a. 01/2014: EF >55%, LVH, mild MR, dilated LA  . 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 RLS (restless legs syndrome)   . Seizure disorder ([place])   . Seizures ([place])   . Sepsis ([place])  08/09/2016  . Stroke due to intracerebral hemorrhage ([place]) 11/17/2013    Medications:  Scheduled:  . aspirin  324 mg Oral Once  . aspirin EC  81 mg Oral Daily  . [START ON 08/09/2018] aspirin EC  81 mg Oral Daily  . carvedilol  12.5 mg Oral BID WC  . fluticasone  1 spray Each Nare Daily  . gabapentin  300-600 mg Oral TID  . levETIRAcetam  500 mg Oral BID  . mycophenolate  180 mg Oral BID  . nitroGLYCERIN  0.5 inch Topical Q6H  . simvastatin  40 mg Oral q1800  . sodium bicarbonate  650 mg Oral TID  . tacrolimus  4 mg Oral BID  . tamoxifen  20 mg Oral Daily   Infusions:   PRN: acetaminophen, nitroGLYCERIN, ondansetron (ZOFRAN) IV  Assessment: Pharmacy was consulted to initiate heparin. No note of being on DOAC PTA.   Goal of Therapy:  Heparin level 0.3-0.7 units/ml Monitor platelets by anticoagulation protocol: Yes   Plan:  Give 4000 units bolus x 1 Start heparin infusion at 1000 units/hr Check anti-Xa level in 6 hours and daily while on heparin Continue to monitor H&H and platelets  Oswald Hillock, PharmD, BCPS Clinical Pharmacist 08/08/2018,6:03 PM

## 2018-08-08 NOTE — ED Provider Notes (Signed)
[facility] Emergency Department Provider Note    First MD Initiated Contact with Patient 08/08/18 1613     (approximate)  I have reviewed the triage vital signs and the nursing notes.   HISTORY  Chief Complaint Chest Pain    HPI [name] is a 53 y.o. female extensive past medical history as listed below presents the ER with chief complaint of 1 day of chest pressure discomfort rating to her left arm and left jaw associated with diaphoresis and nausea.  No fevers.  Has had some shortness of breath.  She is status post renal transplant 2015.  Denies any abdominal pain.  No back pain.  No diarrhea.  No recent fevers.   Past Medical History:  Diagnosis Date  . Arrhythmia   . Cerebrovascular accident [facility]) 2010   without any focal neurological deficits.   . Clotted renal dialysis AV graft ([place])   . Dependence on hemodialysis ([place]) 11/17/2013  . Diabetes mellitus without complication ([place])   . Ductal carcinoma in situ (DCIS) of left breast   . End stage renal disease ([place])    a. previously on dialysis on Tues, Thurs, & Nov 21, 2022 x 7 yrs; b. s/p deceased donor transplant 04/21/2014   . GERD (gastroesophageal reflux disease)   . History of methicillin resistant Staphylococcus aureus infection   . History of stress test    a. 04/2013: normal, EF 65%  . HIT (heparin-induced thrombocytopenia) ([place])   . Hypertension   . Mitral regurgitation    a. 01/2014: EF >55%, LVH, mild MR, dilated LA  . Renal transplant, status post Apr 21, 2014  . RLS (restless legs syndrome)   . Seizure disorder ([place])   . Seizures ([place])   . Sepsis ([place]) 08/09/2016  . Stroke due to intracerebral hemorrhage ([place]) 11/17/2013   Family History  Problem Relation Age of Onset  . Heart attack Mother   . Hypertension Mother   . Hyperlipidemia Mother   . Cancer Mother        breast  . Heart attack Father   . Hypertension Father   . Hyperlipidemia Father   . Kidney failure Sister    half sister  . Diabetes Maternal Grandmother   . Hearing loss Maternal Grandmother   . Heart failure Maternal Grandmother   . Diabetes Maternal Grandfather   . Stroke Maternal Grandfather   . Cancer Paternal Grandmother        breast  . Stroke Paternal Grandfather   . Aneurysm Brother        Brain  . Other Sister        Nerve problems   Past Surgical History:  Procedure Laterality Date  . INSERTION OF DIALYSIS CATHETER     x 2  . KIDNEY TRANSPLANT  04-21-2014   right side.  Marland Kitchen MASTECTOMY PARTIAL / LUMPECTOMY Left 09/24/2015  . MASTECTOMY PARTIAL / LUMPECTOMY Left 10/25/2015   Patient Active Problem List   Diagnosis Date Noted  . Chronic bilateral low back pain with left-sided sciatica 09/27/2017  . Age-related nuclear cataract of both eyes 08/19/2017  . Diabetes mellitus type 2 without retinopathy ([place]) 08/19/2017  . Hyperopia with astigmatism and presbyopia, bilateral 08/19/2017  . Hypertensive retinopathy of both eyes 08/19/2017  . DM (diabetes mellitus), type 2 with renal complications ([place]) 83/15/1761  . Vitamin D deficiency 10/02/2016  . 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 CKD (chronic kidney disease) stage 3, GFR 30-59 ml/min (HCC) 08/17/2016  . Neuropathic pain of right  thigh 08/17/2016  . History of CVA (cerebrovascular accident) 08/17/2016  . Ductal carcinoma in situ (DCIS) of left breast 09/12/2015  . Mitral regurgitation   . Renal transplant, status post 03/24/2014  . GERD (gastroesophageal reflux disease) 03/24/2014  . SVT (supraventricular tachycardia) ([place]) 11/17/2013  . End stage renal disease ([place]) 11/17/2013  . Seizure disorder ([place]) 11/17/2013  . Benign hypertensive renal disease 11/17/2013      Prior to Admission medications   Medication Sig Start Date End Date Taking? Authorizing Provider  aspirin EC 81 MG tablet Take 81 mg by mouth daily.    [provider]  carvedilol (COREG) 12.5 MG tablet Take 12.5 mg by mouth 2 (two)  times daily with a meal.  08/06/16   [provider]  cetirizine (ZYRTEC) 10 MG tablet Take 1 tablet (10 mg total) by mouth daily. 11/19/16   Johnson, Megan P, DO  Cholecalciferol (VITAMIN D PO) Take by mouth daily.    [provider]  fluticasone (FLONASE) 50 MCG/ACT nasal spray Two sprays each nare twice a day. 12/06/17   Johnson, Megan P, DO  gabapentin (NEURONTIN) 300 MG capsule Take 300-600 mg by mouth 3 (three) times daily. Take 1 capsule (300 mg) by mouth in the morning and at noon, 600 mg at bedtime. 06/13/14   [provider]  levETIRAcetam (KEPPRA) 500 MG tablet Take 500 mg by mouth 2 (two) times daily.  11/09/16   [provider]  lidocaine (XYLOCAINE) 2 % solution Use as directed 5 mLs in the mouth or throat as needed for mouth pain. 10/04/17   Volney American, PA-C  magnesium oxide (MAG-OX) 400 MG tablet Take by mouth.    [provider]  metFORMIN (GLUCOPHAGE-XR) 500 MG 24 hr tablet Take 1 tablet (500 mg total) by mouth daily with breakfast. 08/12/17   Park Liter P, DO  mycophenolate (MYFORTIC) 180 MG EC tablet Take by mouth. 09/08/17 09/08/18  [provider]  omeprazole (PRILOSEC) 20 MG capsule Take 20 mg by mouth at bedtime.  09/15/13   [provider]  sodium bicarbonate 650 MG tablet Take 2 tablets (1300 mg) three times a day. 04/11/14   [provider]  tacrolimus (PROGRAF) 1 MG capsule Take 4 mg by mouth 2 (two) times daily.  06/06/14   [provider]  tamoxifen (NOLVADEX) 20 MG tablet Take 20 mg by mouth daily. 12/07/16   [provider]  valACYclovir (VALTREX) 1000 MG tablet Take 1 tablet (1,000 mg total) by mouth 2 (two) times daily. 06/27/18   Park Liter P, DO    Allergies Heparin; Ibuprofen; Other; and Penicillins    Social History Social History   Tobacco Use  . Smoking status: Former Smoker    Last attempt to quit: 11/25/2008    Years since quitting: 9.7  . Smokeless tobacco:  Never Used  Substance Use Topics  . Alcohol use: Yes    Alcohol/week: 2.0 standard drinks    Types: 2 Glasses of wine per week  . Drug use: No    Types: "Crack" cocaine    Comment: quit in 2003    Review of Systems Patient denies headaches, rhinorrhea, blurry vision, numbness, shortness of breath, chest pain, edema, cough, abdominal pain, nausea, vomiting, diarrhea, dysuria, fevers, rashes or hallucinations unless otherwise stated above in HPI. ____________________________________________   PHYSICAL EXAM:  VITAL SIGNS: Vitals:   08/08/18 1630 08/08/18 1700  BP: (!) 131/106 (!) 124/97  Pulse: 80 75  Resp: 18 (!) 25  Temp:    SpO2: 99% 100%    Constitutional: Alert and oriented.  Eyes: Conjunctivae are normal.  Head: Atraumatic. Nose: No congestion/rhinnorhea. Mouth/Throat: Mucous membranes are moist.   Neck: No stridor. Painless ROM.  Cardiovascular: Normal rate, regular rhythm. Grossly normal heart sounds.  Good peripheral circulation. Respiratory: Normal respiratory effort.  No retractions. Lungs CTAB. Gastrointestinal: Soft and nontender. No distention. No abdominal bruits. No CVA tenderness. Genitourinary:  Musculoskeletal: No lower extremity tenderness nor edema.  No joint effusions. Neurologic:  Normal speech and language. No gross focal neurologic deficits are appreciated. No facial droop Skin:  Skin is warm, dry and intact. No rash noted. Psychiatric: Speech and behavior are normal.  ____________________________________________   LABS (all labs ordered are listed, but only abnormal results are displayed)  Results for orders placed or performed during the hospital encounter of 08/08/18 (from the past 24 hour(s))  Basic metabolic panel     Status: Abnormal   Collection Time: 08/08/18  4:10 PM  Result Value Ref Range   Sodium 135 135 - 145 mmol/L   Potassium 4.7 3.5 - 5.1 mmol/L   Chloride 106 98 - 111 mmol/L   CO2 20 (L) 22 - 32 mmol/L   Glucose, Bld 170  (H) 70 - 99 mg/dL   BUN 35 (H) 6 - 20 mg/dL   Creatinine, Ser 2.27 (H) 0.44 - 1.00 mg/dL   Calcium 10.6 (H) 8.9 - 10.3 mg/dL   GFR calc non Af Amer 24 (L) >60 mL/min   GFR calc Af Amer 28 (L) >60 mL/min   Anion gap 9 5 - 15  CBC     Status: Abnormal   Collection Time: 08/08/18  4:10 PM  Result Value Ref Range   WBC 6.6 4.0 - 10.5 K/uL   RBC 3.87 3.87 - 5.11 MIL/uL   Hemoglobin 11.9 (L) 12.0 - 15.0 g/dL   HCT 37.2 36.0 - 46.0 %   MCV 96.1 80.0 - 100.0 fL   MCH 30.7 26.0 - 34.0 pg   MCHC 32.0 30.0 - 36.0 g/dL   RDW 12.4 11.5 - 15.5 %   Platelets 220 150 - 400 K/uL   nRBC 0.0 0.0 - 0.2 %  Troponin I - ONCE - STAT     Status: None   Collection Time: 08/08/18  4:10 PM  Result Value Ref Range   Troponin I <0.03 <0.03 ng/mL  Glucose, capillary     Status: Abnormal   Collection Time: 08/08/18  4:14 PM  Result Value Ref Range   Glucose-Capillary 150 (H) 70 - 99 mg/dL   ____________________________________________  EKG My review and personal interpretation at Time: 16:03   Indication: chest pain  Rate: 85  Rhythm: sinus Axis: normal Other: normal intervals, <83mm elevation in inferior leads without reciprocal changes, does appear new as compared to previous   My review and personal interpretation at Time: 16:09   Indication: chest pain  Rate: 80  Rhythm: sinus Axis: normal Other: st elevations less apparent now.  Very similar to previous in june ____________________________________________  RADIOLOGY  I personally reviewed all radiographic images ordered to evaluate for the above acute complaints and reviewed radiology reports and findings.  These findings were personally discussed with the patient.  Please see medical record for radiology report.  ____________________________________________   PROCEDURES  Procedure(s) performed:  .Critical Care Performed by: Merlyn Lot, MD Authorized by: Merlyn Lot, MD   Critical care provider statement:    Critical care  time (minutes):  30  Critical care time was exclusive of:  Separately billable procedures and treating other patients   Critical care was necessary to treat or prevent imminent or life-threatening deterioration of the following conditions:  Cardiac failure   Critical care was time spent personally by me on the following activities:  Development of treatment plan with patient or surrogate, discussions with consultants, evaluation of patient's response to treatment, examination of patient, obtaining history from patient or surrogate, ordering and performing treatments and interventions, ordering and review of laboratory studies, ordering and review of radiographic studies, pulse oximetry, re-evaluation of patient's condition and review of old charts      Critical Care performed: yes ____________________________________________   INITIAL IMPRESSION / ASSESSMENT AND PLAN / ED COURSE  Pertinent labs & imaging results that were available during my care of the patient were reviewed by me and considered in my medical decision making (see chart for details).   DDX: ACS, pericarditis, esophagitis, boerhaaves, pe, dissection, pna, bronchitis, costochondritis   Nyasiah D Evans is a 53 y.o. who presents to the ED with symptoms as described above.  Patient arrives with initial EKG concerning for inferior ST elevation MI.  Will repeat.  Will consult with cardiology and initiate code STEMI.  Patient does appear to be having unstable angina.  Clinical Course as of Aug 08 1742  Mon Aug 08, 2018  1621 The patient's repeat EKG looks more consistent with previous.  Initial may have been secondary to motion artifact.  Patient currently pain-free.  States that she has been on undergoing quite a deal of stress at home to the loss of family member.   [PR]  2595 Renal function appears at baseline.   [PR]  1637 Initial troponin negative.  Blood pressure stable.  Patient admitting to significant stress  contributing to pain discomfort.  Will observe in the ER for serial enzymes.   [PR]  1733 Patient having persistent discomfort.  Will heparinize.  Will start aspirin.  Based on her history EKG changes will admit to the hospital for further hemodynamic monitoring.   [PR]    Clinical Course User Index [PR] Merlyn Lot, MD     As part of my medical decision making, I reviewed the following data within the [place] notes reviewed and incorporated, Labs reviewed, notes from prior ED visits.   ____________________________________________   FINAL CLINICAL IMPRESSION(S) / ED DIAGNOSES  Final diagnoses:  Chest pain, unspecified type      NEW MEDICATIONS STARTED DURING THIS VISIT:  New Prescriptions   No medications on file     Note:  This document was prepared using Dragon voice recognition software and may include unintentional dictation errors.    Merlyn Lot, MD 08/08/18 1744

## 2018-08-08 NOTE — Progress Notes (Addendum)
ANTICOAGULATION CONSULT NOTE - Initial Consult  Pharmacy Consult for Argtroban Indication: chest pain/ACS  Allergies  Allergen Reactions  . Heparin     Heparin induced THrombocytopenia "i bled for a long time and had to have 2 blood transfusions"   . Ibuprofen     Caused kidney damage Can't take due to kidneys  . Other     Other reaction(s): Other (See Comments) Causing HITT  . Penicillins     Childhood reaction Patient is unsure if she's allergic, was told she had a reaction as a child but thinks she's taking a "cillin" while sick without having a reaction.  Pt states she has taken amoxicillin in the past and did not have problem    Patient Measurements: Weight: 202 lb (91.6 kg)  Vital Signs: Temp: 98.9 F (37.2 C) (01/06 1604) Temp Source: Oral (01/06 1604) BP: 111/69 (01/06 1900) Pulse Rate: 91 (01/06 1900)  Labs: Recent Labs    08/08/18 1610 08/08/18 1837  HGB 11.9*  --   HCT 37.2  --   PLT 220  --   APTT  --  28  LABPROT  --  13.2  INR  --  1.01  CREATININE 2.27*  --   TROPONINI <0.03 <0.03    Estimated Creatinine Clearance: 32.8 mL/min (A) (by C-G formula based on SCr of 2.27 mg/dL (H)).   Medical History: Past Medical History:  Diagnosis Date  . Arrhythmia   . Cerebrovascular accident [facility]) 2010   without any focal neurological deficits.   . Clotted renal dialysis AV graft ([name])   . Dependence on hemodialysis ([place]) 11/17/2013  . Diabetes mellitus without complication ([place])   . Ductal carcinoma in situ (DCIS) of left breast   . End stage renal disease ([place])    a. previously on dialysis on Tues, Thurs, & 2022-11-05 x 7 yrs; b. s/p deceased donor transplant 04-05-2014   . GERD (gastroesophageal reflux disease)   . History of methicillin resistant Staphylococcus aureus infection   . History of stress test    a. 04/2013: normal, EF 65%  . HIT (heparin-induced thrombocytopenia) ([place])   . Hypertension   . Mitral regurgitation    a. 01/2014: EF >55%, LVH, mild  MR, dilated LA  . Renal transplant, status post 04-05-14  . RLS (restless legs syndrome)   . Seizure disorder ([place])   . Seizures ([place])   . Sepsis ([place]) 08/09/2016  . 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 [START ON 08/09/2018] aspirin EC  81 mg Oral Daily  . [START ON 08/09/2018] carvedilol  12.5 mg Oral BID WC  . fluticasone  1 spray Each Nare Daily  . [START ON 08/09/2018] gabapentin  300 mg Oral BID  . gabapentin  600 mg Oral QHS  . heparin  4,000 Units Intravenous Once  . [START ON 08/09/2018] insulin aspart  0-9 Units Subcutaneous TID WC  . levETIRAcetam  500 mg Oral BID  . mycophenolate  180 mg Oral BID  . nitroGLYCERIN  0.5 inch Topical Q6H  . simvastatin  40 mg Oral q1800  . sodium bicarbonate  650 mg Oral TID  . tacrolimus  4 mg Oral BID  . tamoxifen  20 mg Oral Daily   Infusions:   PRN: acetaminophen, nitroGLYCERIN, ondansetron (ZOFRAN) IV  Assessment: Patient was on heparin and was then d/c'ed due to HIT allergy. Recommended using argatroban.     Goal of Therapy:  Monitor platelets by anticoagulation protocol:  Yes  aPTT goal 50-90 seconds  Plan:  Will start the argatroban dose at 1 mcg/kg/min. Will order a aPTT level in 2 hours from start of therapy.  Oswald Hillock, PharmD, BCPS Clinical Pharmacist 08/08/2018,7:33 PM

## 2018-08-08 NOTE — ED Triage Notes (Signed)
Patient presents to the ED with chest pain radiating down her left arm.  Patient states yesterday she became upset and started having some chest pain and palpitations.  Patient states chest pain has been intermittent since yesterday morning.

## 2018-08-08 NOTE — Progress Notes (Signed)
ANTICOAGULATION CONSULT NOTE - Initial Consult  Pharmacy Consult for Argtroban Indication: chest pain/ACS  Allergies  Allergen Reactions  . Heparin     Heparin induced THrombocytopenia "i bled for a long time and had to have 2 blood transfusions"   . Ibuprofen     Caused kidney damage Can't take due to kidneys  . Other     Other reaction(s): Other (See Comments) Causing HITT  . Penicillins     Childhood reaction Patient is unsure if she's allergic, was told she had a reaction as a child but thinks she's taking a "cillin" while sick without having a reaction.  Pt states she has taken amoxicillin in the past and did not have problem    Patient Measurements: Weight: 202 lb (91.6 kg)  Vital Signs: Temp: 98.3 F (36.8 C) (01/06 2055) Temp Source: Oral (01/06 2055) BP: 123/83 (01/06 2055) Pulse Rate: 71 (01/06 2055)  Labs: Recent Labs    08/08/18 1610 08/08/18 1837 08/08/18 2209  HGB 11.9*  --   --   HCT 37.2  --   --   PLT 220  --   --   APTT  --  28 28  LABPROT  --  13.2  --   INR  --  1.01  --   CREATININE 2.27*  --   --   TROPONINI <0.03 <0.03  --     Estimated Creatinine Clearance: 32.8 mL/min (A) (by C-G formula based on SCr of 2.27 mg/dL (H)).   Medical History: Past Medical History:  Diagnosis Date  . Arrhythmia   . Cerebrovascular accident [facility]) 2010   without any focal neurological deficits.   . Clotted renal dialysis AV graft ([place])   . Dependence on hemodialysis ([place]) 11/17/2013  . Diabetes mellitus without complication ([place])   . Ductal carcinoma in situ (DCIS) of left breast   . End stage renal disease ([place])    a. previously on dialysis on Tues, Thurs, & 2022-11-18 x 7 yrs; b. s/p deceased donor transplant Apr 18, 2014   . GERD (gastroesophageal reflux disease)   . History of methicillin resistant Staphylococcus aureus infection   . History of stress test    a. 04/2013: normal, EF 65%  . HIT (heparin-induced thrombocytopenia) ([place])   . Hypertension   .  Mitral regurgitation    a. 01/2014: EF >55%, LVH, mild MR, dilated LA  . Renal transplant, status post 04/18/2014  . RLS (restless legs syndrome)   . Seizure disorder ([place])   . Seizures ([place])   . Sepsis ([place]) 08/09/2016  . 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 [START ON 08/09/2018] aspirin EC  81 mg Oral Daily  . [START ON 08/09/2018] carvedilol  12.5 mg Oral BID WC  . [START ON 08/09/2018] fluticasone  1 spray Each Nare Daily  . [START ON 08/09/2018] gabapentin  300 mg Oral BID  . gabapentin  600 mg Oral QHS  . [START ON 08/09/2018] insulin aspart  0-9 Units Subcutaneous TID WC  . levETIRAcetam  500 mg Oral BID  . mycophenolate  180 mg Oral BID  . nitroGLYCERIN  0.5 inch Topical Q6H  . [START ON 08/09/2018] pneumococcal 23 valent vaccine  0.5 mL Intramuscular Tomorrow-1000  . simvastatin  40 mg Oral q1800  . sodium bicarbonate  650 mg Oral TID  . tacrolimus  4 mg Oral BID  . [START ON 08/09/2018] tamoxifen  20 mg Oral Daily   Infusions:  PRN: acetaminophen, nitroGLYCERIN, ondansetron (ZOFRAN) IV  Assessment: Patient was on heparin and was then d/c'ed due to HIT allergy. Recommended using argatroban.     Goal of Therapy:  Monitor platelets by anticoagulation protocol: Yes  aPTT goal 50-90 seconds  Plan:  Will start the argatroban dose at 1 mcg/kg/min. Will order a aPTT level in 2 hours from start of therapy.  1/6 PM aPTT 28. Argatroban d/c by hospitailst.  Eloise Harman, PharmD, BCPS Clinical Pharmacist 08/08/2018,10:32 PM

## 2018-08-08 NOTE — ED Notes (Signed)
Notified admitting MD of pt previous HIT to Heparin.  He notified me that he would D/c and order Lovenox.  Await orders.  Pt is in no distress.  Pt has eaten dinner.  Pt states that she feels better

## 2018-08-08 NOTE — H&P (Signed)
[facility] NAME: [name]    MR#:  [id number]  DATE OF BIRTH:  1966/06/01  DATE OF ADMISSION:  08/08/2018  PRIMARY CARE PHYSICIAN: Valerie Roys, DO   REQUESTING/REFERRING PHYSICIAN: Dr. Merlyn Lot  CHIEF COMPLAINT: Left-sided chest pain   Chief Complaint  Patient presents with  . Chest Pain    HISTORY OF PRESENT ILLNESS:  [name]  is a 53 y.o. female with a known history of renal transplant, essential hypertension, CVA, seizure disorder, breast cancer comes in because of chest pressure, palpitations, patient complains of chest pressure going to the left arm, jaw, some nausea.  The symptoms started yesterday but denies any chest pain now.  She told me that she lost 3 family members in 1 week and having a lot of stress.  PAST MEDICAL HISTORY:   Past Medical History:  Diagnosis Date  . Arrhythmia   . Cerebrovascular accident [facility]) 2010   without any focal neurological deficits.   . Clotted renal dialysis AV graft ([place])   . 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 Diabetes mellitus without complication ([place])   . Ductal carcinoma in situ (DCIS) of left breast   . End stage renal disease ([place])    a. previously on dialysis on Tues, Thurs, & 2022-11-04 x 7 yrs; b. s/p deceased donor transplant 04-Apr-2014   . GERD (gastroesophageal reflux disease)   . History of methicillin resistant Staphylococcus aureus infection   . History of stress test    a. 04/2013: normal, EF 65%  . HIT (heparin-induced thrombocytopenia) ([place])   . Hypertension   . Mitral regurgitation    a. 01/2014: EF >55%, LVH, mild MR, dilated LA  . Renal transplant, status post 04-04-2014  . RLS (restless legs syndrome)   . Seizure disorder ([place])   . Seizures ([place])   . Sepsis ([place]) 08/09/2016  . Stroke due to intracerebral hemorrhage ([place]) 11/17/2013    PAST SURGICAL HISTOIRY:   Past Surgical History:  Procedure Laterality Date  .  INSERTION OF DIALYSIS CATHETER     x 2  . KIDNEY TRANSPLANT  Apr 04, 2014   right side.  [facility] MASTECTOMY PARTIAL / LUMPECTOMY Left 09/24/2015  . MASTECTOMY PARTIAL / LUMPECTOMY Left 10/25/2015    SOCIAL HISTORY:   Social History   Tobacco Use  . Smoking status: Former Smoker    Last attempt to quit: 11/25/2008    Years since quitting: 9.7  . Smokeless tobacco: Never Used  Substance Use Topics  . Alcohol use: Yes    Alcohol/week: 2.0 standard drinks    Types: 2 Glasses of wine per week    FAMILY HISTORY:   Family History  Problem Relation Age of Onset  . Heart attack Mother   . Hypertension Mother   . Hyperlipidemia Mother   . Cancer Mother        breast  . Heart attack Father   . Hypertension Father   . Hyperlipidemia Father   . Kidney failure Sister        half sister  . Diabetes Maternal Grandmother   . Hearing loss Maternal Grandmother   . Heart failure Maternal Grandmother   . Diabetes Maternal Grandfather   . Stroke Maternal Grandfather   . Cancer Paternal Grandmother        breast  . Stroke Paternal Grandfather   . Aneurysm Brother        Brain  . Other Sister  Nerve problems    DRUG ALLERGIES:   Allergies  Allergen Reactions  . Heparin     Heparin induced THrombocytopenia "i bled for a long time and had to have 2 blood transfusions"   . Ibuprofen     Caused kidney damage Can't take due to kidneys  . Other     Other reaction(s): Other (See Comments) Causing HITT  . Penicillins     Childhood reaction Patient is unsure if she's allergic, was told she had a reaction as a child but thinks she's taking a "cillin" while sick without having a reaction.  Pt states she has taken amoxicillin in the past and did not have problem    REVIEW OF SYSTEMS:  CONSTITUTIONAL: No fever, fatigue or weakness.  EYES: No blurred or double vision.  EARS, NOSE, AND THROAT: No tinnitus or ear pain.  RESPIRATORY: No cough, shortness of breath, wheezing or hemoptysis.   CARDIOVASCULAR: Chest pain, palpitations GASTROINTESTINAL: No nausea, vomiting, diarrhea or abdominal pain.  GENITOURINARY: No dysuria, hematuria.  ENDOCRINE: No polyuria, nocturia,  HEMATOLOGY: No anemia, easy bruising or bleeding SKIN: No rash or lesion. MUSCULOSKELETAL: No joint pain or arthritis.   NEUROLOGIC: No tingling, numbness, weakness.  PSYCHIATRY: Stressful situation in the family MEDICATIONS AT HOME:   Prior to Admission medications   Medication Sig Start Date End Date Taking? Authorizing Provider  aspirin EC 81 MG tablet Take 81 mg by mouth daily.   Yes [provider]  carvedilol (COREG) 12.5 MG tablet Take 12.5 mg by mouth 2 (two) times daily with a meal.  08/06/16  Yes [provider]  cetirizine (ZYRTEC) 10 MG tablet Take 1 tablet (10 mg total) by mouth daily. Patient taking differently: Take 10 mg by mouth daily as needed for allergies or rhinitis.  11/19/16  Yes Johnson, Megan P, DO  cholecalciferol (VITAMIN D) 25 MCG (1000 UT) tablet Take 1,000 Units by mouth daily.   Yes [provider]  fluticasone (FLONASE) 50 MCG/ACT nasal spray Two sprays each nare twice a day. Patient taking differently: Place 2 sprays into both nostrils daily. . 12/06/17  Yes Johnson, Megan P, DO  gabapentin (NEURONTIN) 300 MG capsule Take 600 mg by mouth 3 (three) times daily.    Yes [provider]  levETIRAcetam (KEPPRA) 500 MG tablet Take 500 mg by mouth 2 (two) times daily.  11/09/16  Yes [provider]  magnesium oxide (MAG-OX) 400 MG tablet Take 400 mg by mouth daily.    Yes [provider]  metFORMIN (GLUCOPHAGE-XR) 500 MG 24 hr tablet Take 1 tablet (500 mg total) by mouth daily with breakfast. 08/12/17  Yes Johnson, Megan P, DO  mycophenolate (MYFORTIC) 180 MG EC tablet Take 360 mg by mouth 3 (three) times daily.  09/08/17 09/08/18 Yes [provider]  omeprazole (PRILOSEC) 20 MG capsule Take 20 mg by mouth daily as needed (GERD or  heartburn).    Yes [provider]  sodium bicarbonate 650 MG tablet Take 2 tablets (1300 mg) three times a day. 04/11/14  Yes [provider]  tacrolimus (PROGRAF) 1 MG capsule Take 4 mg by mouth 2 (two) times daily.  06/06/14  Yes [provider]  tamoxifen (NOLVADEX) 20 MG tablet Take 20 mg by mouth daily. 12/07/16  Yes [provider]  valACYclovir (VALTREX) 1000 MG tablet Take 1 tablet (1,000 mg total) by mouth 2 (two) times daily. 06/27/18  Yes Johnson, Megan P, DO  lidocaine (XYLOCAINE) 2 % solution Use as  directed 5 mLs in the mouth or throat as needed for mouth pain. Patient not taking: Reported on 08/08/2018 10/04/17   Volney American, PA-C      VITAL SIGNS:  Blood pressure 111/69, pulse 91, temperature 98.9 F (37.2 C), temperature source Oral, resp. rate 18, weight 91.6 kg, last menstrual period 03/03/2016, SpO2 100 %.  PHYSICAL EXAMINATION:  GENERAL:  53 y.o.-year-old patient lying in the bed with no acute distress.  EYES: Pupils equal, round, reactive to light and accommodation. No scleral icterus. Extraocular muscles intact.  HEENT: Head atraumatic, normocephalic. Oropharynx and nasopharynx clear.  NECK:  Supple, no jugular venous distention. No thyroid enlargement, no tenderness.  LUNGS: Normal breath sounds bilaterally, no wheezing, rales,rhonchi or crepitation. No use of accessory muscles of respiration.  CARDIOVASCULAR: S1, S2 normal. No murmurs, rubs, or gallops.  ABDOMEN: Soft, nontender, nondistended. Bowel sounds present. No organomegaly or mass.  EXTREMITIES: No pedal edema, cyanosis, or clubbing.  NEUROLOGIC: Cranial nerves II through XII are intact. Muscle strength 5/5 in all extremities. Sensation intact. Gait not checked.  PSYCHIATRIC: The patient is alert and oriented x 3.  SKIN: No obvious rash, lesion, or ulcer.   LABORATORY PANEL:   CBC Recent Labs  Lab 08/08/18 1610  WBC 6.6  HGB 11.9*  HCT 37.2  PLT 220    ------------------------------------------------------------------------------------------------------------------  Chemistries  Recent Labs  Lab 08/08/18 1610  NA 135  K 4.7  CL 106  CO2 20*  GLUCOSE 170*  BUN 35*  CREATININE 2.27*  CALCIUM 10.6*   ------------------------------------------------------------------------------------------------------------------  Cardiac Enzymes Recent Labs  Lab 08/08/18 1610  TROPONINI <0.03   ------------------------------------------------------------------------------------------------------------------  RADIOLOGY:  Dg Chest Portable 1 View  Result Date: 08/08/2018 CLINICAL DATA:  Intermittent chest pain and palpitations. EXAM: PORTABLE CHEST 1 VIEW COMPARISON:  Chest x-ray dated August 09, 2016. FINDINGS: The heart size and mediastinal contours are within normal limits. Normal pulmonary vascularity. Mild diffuse peribronchial thickening, similar to prior study. Unchanged linear scarring/atelectasis at the left lung base. No focal consolidation, pleural effusion, or pneumothorax. No acute osseous abnormality. IMPRESSION: 1. Chronic bronchitic changes.  No active disease. Electronically Signed   By: Titus Dubin M.D.   On: 08/08/2018 17:07    EKG:   Orders placed or performed during the hospital encounter of 08/08/18  . EKG 12-Lead  . EKG 12-Lead  . ED EKG within 10 minutes  . ED EKG within 10 minutes  . EKG 12-Lead  . EKG 12-Lead  . EKG 12-Lead  . EKG 12-Lead  Patient initial EKG showed less than 1 mm elevation in inferior leads without reciprocal changes, the following EKG showed normal sinus rhythm, ST elevations less apparent, both EKGs are reviewed by Dr. Fletcher Anon recommended admission for possible and unstable angina.  IMPRESSION AND PLAN:   53 year old female patient with multiple medical problems of breast cancer, history of ESRD status post kidney transplant, seizure disorder, essential hypertension comes in with chest  pain, palpitations.  #1 chest pain, palpitations, patient will be admitted to telemetry for possible unstable angina, initial troponins are negative, EKG initially showed inferior ST elevations but changes disappeared on after the following EKG, patient is currently chest pain-free.  Initially started on heparin drip but she refused because of history of HIT, cycle the cardiac enzymes, continue aspirin, nitrates, beta-blockers, statins, cardiology consult with Dr. Fletcher Anon, epic texted him about heparin, possibly starting patient on Arixtra  #2  ESRD, history of renal transplant, renal function is at the baseline, continue Prograf, mycophenolate.  3.  History of breast cancer, patient is on tamoxifen. 4.  History of seizure disorder, patient does not have any recent seizures, continue Keppra 500 mg p.o. twice daily. 5.  Stress, anxiety, benefit from counseling as an outpatient.  #6 diabetes mellitus type 2: Hold metformin in case if she needs cardiac cath.  Will use sliding scale insulin with coverage.  I kept n.p.o. after midnight just in case if she rules out otherwise she needs to be started on diet by our hospitalist tomorrow morning. All the records are reviewed and case discussed with ED provider. Management plans discussed with the patient, family and they are in agreement.  CODE STATUS: Full code  TOTAL TIME TAKING CARE OF THIS PATIENT: 55 minutes.    Epifanio Lesches M.D on 08/08/2018 at 7:14 PM  Between 7am to 6pm - Pager - [phone]  After 6pm go to [email or web address] - password [email or web address] [facility]  Office  [phone]  CC: Primary care physician; Valerie Roys, DO  Note: This dictation was prepared with Dragon dictation along with smaller phrase technology. Any transcriptional errors that result from this process are unintentional.

## 2018-08-09 ENCOUNTER — Observation Stay (HOSPITAL_BASED_OUTPATIENT_CLINIC_OR_DEPARTMENT_OTHER)
Admit: 2018-08-09 | Discharge: 2018-08-09 | Disposition: A | Discharge status: Home / Self Care | Payer: Medicare Other | Attending: Internal Medicine | Admitting: Internal Medicine

## 2018-08-09 DIAGNOSIS — R079 Chest pain, unspecified: Secondary | ICD-10-CM

## 2018-08-09 DIAGNOSIS — R0789 Other chest pain: Secondary | ICD-10-CM

## 2018-08-09 DIAGNOSIS — I340 Nonrheumatic mitral (valve) insufficiency: Secondary | ICD-10-CM | POA: Diagnosis not present

## 2018-08-09 DIAGNOSIS — N186 End stage renal disease: Secondary | ICD-10-CM | POA: Diagnosis not present

## 2018-08-09 DIAGNOSIS — I471 Supraventricular tachycardia: Secondary | ICD-10-CM | POA: Diagnosis not present

## 2018-08-09 DIAGNOSIS — Z853 Personal history of malignant neoplasm of breast: Secondary | ICD-10-CM | POA: Diagnosis not present

## 2018-08-09 DIAGNOSIS — Z23 Encounter for immunization: Secondary | ICD-10-CM | POA: Diagnosis not present

## 2018-08-09 DIAGNOSIS — E1122 Type 2 diabetes mellitus with diabetic chronic kidney disease: Secondary | ICD-10-CM | POA: Diagnosis not present

## 2018-08-09 DIAGNOSIS — Z7984 Long term (current) use of oral hypoglycemic drugs: Secondary | ICD-10-CM | POA: Diagnosis not present

## 2018-08-09 DIAGNOSIS — G2581 Restless legs syndrome: Secondary | ICD-10-CM | POA: Diagnosis not present

## 2018-08-09 DIAGNOSIS — N183 Chronic kidney disease, stage 3 (moderate): Secondary | ICD-10-CM | POA: Diagnosis not present

## 2018-08-09 DIAGNOSIS — Z8614 Personal history of Methicillin resistant Staphylococcus aureus infection: Secondary | ICD-10-CM | POA: Diagnosis not present

## 2018-08-09 DIAGNOSIS — E119 Type 2 diabetes mellitus without complications: Secondary | ICD-10-CM | POA: Diagnosis not present

## 2018-08-09 DIAGNOSIS — R002 Palpitations: Secondary | ICD-10-CM

## 2018-08-09 DIAGNOSIS — Z940 Kidney transplant status: Secondary | ICD-10-CM | POA: Diagnosis not present

## 2018-08-09 DIAGNOSIS — Z8673 Personal history of transient ischemic attack (TIA), and cerebral infarction without residual deficits: Secondary | ICD-10-CM | POA: Diagnosis not present

## 2018-08-09 DIAGNOSIS — I120 Hypertensive chronic kidney disease with stage 5 chronic kidney disease or end stage renal disease: Secondary | ICD-10-CM | POA: Diagnosis not present

## 2018-08-09 DIAGNOSIS — I10 Essential (primary) hypertension: Secondary | ICD-10-CM | POA: Diagnosis not present

## 2018-08-09 DIAGNOSIS — H35033 Hypertensive retinopathy, bilateral: Secondary | ICD-10-CM | POA: Diagnosis not present

## 2018-08-09 DIAGNOSIS — K219 Gastro-esophageal reflux disease without esophagitis: Secondary | ICD-10-CM | POA: Diagnosis not present

## 2018-08-09 HISTORY — DX: Other chest pain: R07.89

## 2018-08-09 LAB — GLUCOSE, CAPILLARY
GLUCOSE-CAPILLARY: 129 mg/dL — AB (ref 70–99)
Glucose-Capillary: 145 mg/dL — ABNORMAL HIGH (ref 70–99)
Glucose-Capillary: 153 mg/dL — ABNORMAL HIGH (ref 70–99)

## 2018-08-09 LAB — URINE DRUG SCREEN, QUALITATIVE (ARMC ONLY)
Amphetamines, Ur Screen: NOT DETECTED
Barbiturates, Ur Screen: NOT DETECTED
Benzodiazepine, Ur Scrn: NOT DETECTED
Cannabinoid 50 Ng, Ur ~~LOC~~: NOT DETECTED
Cocaine Metabolite,Ur ~~LOC~~: NOT DETECTED
MDMA (Ecstasy)Ur Screen: NOT DETECTED
Methadone Scn, Ur: NOT DETECTED
OPIATE, UR SCREEN: NOT DETECTED
Phencyclidine (PCP) Ur S: NOT DETECTED
Tricyclic, Ur Screen: NOT DETECTED

## 2018-08-09 LAB — ECHOCARDIOGRAM COMPLETE
Height: 65 in
Weight: 3185.6 oz

## 2018-08-09 LAB — TROPONIN I
Troponin I: <0.03 ng/mL (ref <0.03)
Troponin I: <0.03 ng/mL (ref <0.03)

## 2018-08-09 MED ORDER — NITROGLYCERIN 0.4 MG SL SUBL: 0.4000 mg | SUBLINGUAL_TABLET | SUBLINGUAL | 2 refills | Status: AC | PRN

## 2018-08-09 MED ORDER — PANTOPRAZOLE SODIUM 20 MG PO TBEC
20.0000 mg | DELAYED_RELEASE_TABLET | Freq: Two times a day (BID) | ORAL | Status: DC | PRN
  Filled 2018-08-09: qty 1

## 2018-08-09 NOTE — Plan of Care (Signed)
  Problem: Education: Goal: Knowledge of General Education information will improve Description Including pain rating scale, medication(s)/side effects and non-pharmacologic comfort measures Outcome: Adequate for Discharge   Problem: Health Behavior/Discharge Planning: Goal: Ability to manage health-related needs will improve Outcome: Adequate for Discharge   Problem: Clinical Measurements: Goal: Ability to maintain clinical measurements within normal limits will improve Outcome: Adequate for Discharge Goal: Will remain free from infection Outcome: Adequate for Discharge Goal: Diagnostic test results will improve Outcome: Adequate for Discharge   Problem: Elimination: Goal: Will not experience complications related to urinary retention Outcome: Adequate for Discharge   Problem: Pain Managment: Goal: General experience of comfort will improve Outcome: Adequate for Discharge   Problem: Safety: Goal: Ability to remain free from injury will improve Outcome: Adequate for Discharge

## 2018-08-09 NOTE — Care Management Obs Status (Signed)
[place] NOTIFICATION   Patient Details  Name: [name] MRN: [id number] Date of Birth: October 19, 1965   Medicare Observation Status Notification Given:  Yes    Elza Rafter, RN 08/09/2018, 12:24 PM

## 2018-08-09 NOTE — Progress Notes (Signed)
*  PRELIMINARY RESULTS* Echocardiogram 2D Echocardiogram has been performed.  Sherrie Sport 08/09/2018, 3:32 PM

## 2018-08-09 NOTE — Consult Note (Signed)
Cardiology Consultation:   Patient ID: [name] MRN: [id number]; DOB: 1965-10-23  Admit date: 08/08/2018 Date of Consult: 08/09/2018  Primary Care Provider: Valerie Roys, DO Primary Cardiologist: Esmond Plants, MD PhD Primary Electrophysiologist:  None   Patient Profile:   [name] is a 53 y.o. female with a hx of SVT previously followed by Dr. Rockey Situ, CVA, hypertension, type 2 diabetes mellitus, Vidyuth Belsito-stage renal disease status post renal transplant, seizure disorder, and heparin-induced thrombocytopenia, who is being seen today for the evaluation of chest pain at the request of Dr. Bridgett Larsson.  History of Present Illness:   [name] reports being under considerable stress over the last few weeks owing to the death of 3 family members.  3 days ago, she began noticing palpitations with associated diaphoresis, shortness of breath, and chest discomfort.  This seemed to have been brought on by hearing of the death of her cousin.  Since then, she has had intermittent episodes of rapid heartbeat with accompanying chest pressure/heaviness and generalized fatigue.  She was at [facility] yesterday visiting a friend, when she began having generalized fatigue as well as chest pressure and diaphoresis again.  This prompted her to go to the emergency department for further evaluation.  Since being admitted, [name] has felt well without further chest pain, shortness of breath, and palpitations.  She reports having been on medication in the past for elevated heart rates and was treated at one point with amiodarone.  This had been discontinued the last time she was seen by Dr. Rockey Situ in 2016.  In the ED yesterday, EKG was concerning for ST elevation, though findings were suggestive of early repolarization.  Initial troponin was negative.  Anticoagulation and cardiac catheterization were therefore deferred.  Past Medical History:  Diagnosis Date  . Arrhythmia   . Cerebrovascular accident [facility]) 2010     without any focal neurological deficits.   . Clotted renal dialysis AV graft ([place])   . Dependence on hemodialysis ([place]) 11/17/2013  . Diabetes mellitus without complication ([place])   . Ductal carcinoma in situ (DCIS) of left breast   . Tayvin Preslar stage renal disease ([place])    a. previously on dialysis on Tues, Thurs, & Oct 31, 2022 x 7 yrs; b. s/p deceased donor transplant 03/31/2014   . GERD (gastroesophageal reflux disease)   . History of methicillin resistant Staphylococcus aureus infection   . History of stress test    a. 04/2013: normal, EF 65%  . HIT (heparin-induced thrombocytopenia) ([place])   . Hypertension   . Mitral regurgitation    a. 01/2014: EF >55%, LVH, mild MR, dilated LA  . Renal transplant, status post 31-Mar-2014  . RLS (restless legs syndrome)   . Seizure disorder ([place])   . Seizures ([place])   . Sepsis ([place]) 08/09/2016  . Stroke due to intracerebral hemorrhage ([place]) 11/17/2013    Past Surgical History:  Procedure Laterality Date  . INSERTION OF DIALYSIS CATHETER     x 2  . KIDNEY TRANSPLANT  March 31, 2014   right side.  Marland Kitchen MASTECTOMY PARTIAL / LUMPECTOMY Left 09/24/2015  . MASTECTOMY PARTIAL / LUMPECTOMY Left 10/25/2015     Home Medications:  Prior to Admission medications   Medication Sig Start Date Mekhia Brogan Date Taking? Authorizing Provider  aspirin EC 81 MG tablet Take 81 mg by mouth daily.   Yes [provider]  carvedilol (COREG) 12.5 MG tablet Take 12.5 mg by mouth 2 (two) times daily with a meal.  08/06/16  Yes [provider]  cetirizine (ZYRTEC) 10 MG tablet Take 1 tablet (10 mg total) by mouth daily. Patient taking differently: Take 10 mg by mouth daily as needed for allergies or rhinitis.  11/19/16  Yes Johnson, Megan P, DO  cholecalciferol (VITAMIN D) 25 MCG (1000 UT) tablet Take 1,000 Units by mouth daily.   Yes [provider]  fluticasone (FLONASE) 50 MCG/ACT nasal spray Two sprays each nare twice a day. Patient taking differently: Place 2 sprays into both  nostrils daily. . 12/06/17  Yes Johnson, Megan P, DO  gabapentin (NEURONTIN) 300 MG capsule Take 600 mg by mouth 3 (three) times daily.    Yes [provider]  levETIRAcetam (KEPPRA) 500 MG tablet Take 500 mg by mouth 2 (two) times daily.  11/09/16  Yes [provider]  magnesium oxide (MAG-OX) 400 MG tablet Take 400 mg by mouth daily.    Yes [provider]  metFORMIN (GLUCOPHAGE-XR) 500 MG 24 hr tablet Take 1 tablet (500 mg total) by mouth daily with breakfast. 08/12/17  Yes Johnson, Megan P, DO  mycophenolate (MYFORTIC) 180 MG EC tablet Take 360 mg by mouth 3 (three) times daily.  09/08/17 09/08/18 Yes [provider]  omeprazole (PRILOSEC) 20 MG capsule Take 20 mg by mouth daily as needed (GERD or heartburn).    Yes [provider]  sodium bicarbonate 650 MG tablet Take 2 tablets (1300 mg) three times a day. 04/11/14  Yes [provider]  tacrolimus (PROGRAF) 1 MG capsule Take 4 mg by mouth 2 (two) times daily.  06/06/14  Yes [provider]  tamoxifen (NOLVADEX) 20 MG tablet Take 20 mg by mouth daily. 12/07/16  Yes [provider]  valACYclovir (VALTREX) 1000 MG tablet Take 1 tablet (1,000 mg total) by mouth 2 (two) times daily. 06/27/18  Yes Johnson, Megan P, DO  lidocaine (XYLOCAINE) 2 % solution Use as directed 5 mLs in the mouth or throat as needed for mouth pain. Patient not taking: Reported on 08/08/2018 10/04/17   Volney American, PA-C    Inpatient Medications: Scheduled Meds: . aspirin EC  81 mg Oral Daily  . carvedilol  12.5 mg Oral BID WC  . fluticasone  1 spray Each Nare Daily  . gabapentin  300 mg Oral BID  . gabapentin  600 mg Oral QHS  . insulin aspart  0-9 Units Subcutaneous TID WC  . levETIRAcetam  500 mg Oral BID  . mycophenolate  180 mg Oral BID  . nitroGLYCERIN  0.5 inch Topical Q6H  . pneumococcal 23 valent vaccine  0.5 mL Intramuscular Tomorrow-1000  . simvastatin  40 mg Oral q1800  . sodium bicarbonate   650 mg Oral TID  . tacrolimus  4 mg Oral BID  . tamoxifen  20 mg Oral Daily   Continuous Infusions:  PRN Meds: acetaminophen, nitroGLYCERIN, ondansetron (ZOFRAN) IV, pantoprazole  Allergies:    Allergies  Allergen Reactions  . Heparin     Heparin induced THrombocytopenia "i bled for a long time and had to have 2 blood transfusions"   . Ibuprofen     Caused kidney damage Can't take due to kidneys  . Other     Other reaction(s): Other (See Comments) Causing HITT  . Penicillins     Childhood reaction Patient is unsure if she's allergic, was told she had a reaction as a child but thinks she's taking a "cillin" while sick without having a reaction.  Pt states she has taken amoxicillin in the past and did not  have problem    Social History:   Social History   Tobacco Use  . Smoking status: Former Smoker    Last attempt to quit: 11/25/2008    Years since quitting: 9.7  . Smokeless tobacco: Never Used  Substance Use Topics  . Alcohol use: Yes    Alcohol/week: 2.0 standard drinks    Types: 2 Glasses of wine per week  . Drug use: No    Types: "Crack" cocaine    Comment: quit in 2003     Family History:   Family History  Problem Relation Age of Onset  . Heart attack Mother   . Hypertension Mother   . Hyperlipidemia Mother   . Cancer Mother        breast  . Heart attack Father   . Hypertension Father   . Hyperlipidemia Father   . Kidney failure Sister        half sister  . Diabetes Maternal Grandmother   . Hearing loss Maternal Grandmother   . Heart failure Maternal Grandmother   . Diabetes Maternal Grandfather   . Stroke Maternal Grandfather   . Cancer Paternal Grandmother        breast  . Stroke Paternal Grandfather   . Aneurysm Brother        Brain  . Other Sister        Nerve problems     ROS:  Please see the history of present illness. All other ROS reviewed and negative.     Physical Exam/Data:   Vitals:   08/08/18 1900 08/08/18 2018 08/08/18  2055 08/09/18 0411  BP: 111/69 134/88 123/83 129/88  Pulse: 91 73 71 66  Resp: 18 18 (!) 24 20  Temp:   98.3 F (36.8 C) 97.8 F (36.6 C)  TempSrc:   Oral Oral  SpO2: 100% 100% 98% 97%  Weight:   90.5 kg 90.3 kg  Height:   5\' 5"  (1.651 m)     Intake/Output Summary (Last 24 hours) at 08/09/2018 0823 Last data filed at 08/09/2018 0415 Gross per 24 hour  Intake -  Output 1350 ml  Net -1350 ml   Filed Weights   08/08/18 1808 08/08/18 2055 08/09/18 0411  Weight: 91.6 kg 90.5 kg 90.3 kg   Body mass index is 33.13 kg/m.  General:  Well nourished, well developed, in no acute distress. HEENT: normal Lymph: no adenopathy Neck: no JVD Endocrine:  No thryomegaly Vascular: No carotid bruits; 2+ left radial pulse.  Right arm hemodialysis fistula in place. Cardiac: Regular rate and rhythm without murmurs, rubs, or gallops. Lungs:  clear to auscultation bilaterally, no wheezing, rhonchi or rales  Abd: soft, nontender, no hepatomegaly  Ext: no edema Musculoskeletal:  No deformities, BUE and BLE strength normal and equal Skin: warm and dry  Neuro:  CNs 2-12 intact, no focal abnormalities noted Psych:  Normal affect   EKG:  The EKG was personally reviewed and demonstrates: Normal sinus rhythm with early repolarization. Telemetry:  Telemetry was personally reviewed and demonstrates: Normal sinus rhythm  Relevant CV Studies: None  Laboratory Data:  Chemistry Recent Labs  Lab 08/08/18 1610  NA 135  K 4.7  CL 106  CO2 20*  GLUCOSE 170*  BUN 35*  CREATININE 2.27*  CALCIUM 10.6*  GFRNONAA 24*  GFRAA 28*  ANIONGAP 9    No results for input(s): PROT, ALBUMIN, AST, ALT, ALKPHOS, BILITOT in the last 168 hours. Hematology Recent Labs  Lab 08/08/18 1610  WBC 6.6  RBC  3.87  HGB 11.9*  HCT 37.2  MCV 96.1  MCH 30.7  MCHC 32.0  RDW 12.4  PLT 220   Cardiac Enzymes Recent Labs  Lab 08/08/18 1610 08/08/18 1837 08/08/18 2350 08/09/18 0533  TROPONINI <0.03 <0.03 <0.03 <0.03    No results for input(s): TROPIPOC in the last 168 hours.  BNPNo results for input(s): BNP, PROBNP in the last 168 hours.  DDimer No results for input(s): DDIMER in the last 168 hours.  Radiology/Studies:  Dg Chest Portable 1 View  Result Date: 08/08/2018 CLINICAL DATA:  Intermittent chest pain and palpitations. EXAM: PORTABLE CHEST 1 VIEW COMPARISON:  Chest x-ray dated August 09, 2016. FINDINGS: The heart size and mediastinal contours are within normal limits. Normal pulmonary vascularity. Mild diffuse peribronchial thickening, similar to prior study. Unchanged linear scarring/atelectasis at the left lung base. No focal consolidation, pleural effusion, or pneumothorax. No acute osseous abnormality. IMPRESSION: 1. Chronic bronchitic changes.  No active disease. Electronically Signed   By: Titus Dubin M.D.   On: 08/08/2018 17:07    Assessment and Plan:   Atypical chest pain Symptoms most likely due to recent stress in the setting of the death of multiple family members.  Underlying tachyarrhythmia such as SVT may be driving some of the patient's symptoms.  Telemetry has not shown any arrhythmia.  Admission EKG shows some ST elevations, most consistent with early repolarization.  Troponin has been negative x3.  Given multiple cardiac risk factors, I have recommended a pharmacologic myocardial perfusion stress test.  However, the patient refuses to proceed with this, stating that she feels well and does not wish to experience the side effects of regadenoson.  She does not believe she would be able to adequately exercise on the treadmill.  We discussed cardiac catheterization, though given history of kidney transplant and chronic kidney disease with creatinine of 2.3, I do not think that the benefits of catheterization outweigh the risks.  We will obtain a transthoracic echocardiogram.  As long as this does not show any wall motion abnormality or reduced LVEF, I think it is reasonable to defer  additional cardiac testing during this hospitalization.  Given history of remote drug use, urine drug screen is recommended.  [name] should be maintained on current doses of aspirin and carvedilol.  Long-acting nitroglycerin could be added as well if she has recurrent chest pain.  Palpitations and history of SVT Symptoms are largely driven by palpitations.  No arrhythmia noted on telemetry thus far.  I recommend continuing carvedilol 12.5 mg twice daily.  If symptoms persist after discharge, outpatient cardiac monitoring could be considered.  [facility] will sign off.   Medication Recommendations: Continue home medications. Other recommendations (labs, testing, etc): Follow-up echocardiogram.  If no significant abnormality, further inpatient testing can be deferred. Follow up as an outpatient: Turn to see Dr. Rockey Situ or APP in 2 to 4 weeks if no significant abnormalities noted on echo and patient remains chest pain-free.  For questions or updates, please contact [place] Please consult [email or web address] for contact info under [facility] Cardiology.  Signed, Nelva Bush, MD  08/09/2018 8:23 AM

## 2018-08-09 NOTE — Discharge Summary (Addendum)
[place] at [place] NAME: [name]    MR#:  [id number]  DATE OF BIRTH:  08-Apr-1966  DATE OF ADMISSION:  08/08/2018   ADMITTING PHYSICIAN: Epifanio Lesches, MD  DATE OF DISCHARGE: 08/09/2018  PRIMARY CARE PHYSICIAN: Valerie Roys, DO   ADMISSION DIAGNOSIS:  Chest pain, unspecified type [R07.9] DISCHARGE DIAGNOSIS:  Active Problems:   Chest pain   Atypical chest pain  SECONDARY DIAGNOSIS:   Past Medical History:  Diagnosis Date  . Arrhythmia   . Cerebrovascular accident [facility]) 2010   without any focal neurological deficits.   . Clotted renal dialysis AV graft ([place])   . Dependence on hemodialysis ([place]) 11/17/2013  . Diabetes mellitus without complication ([place])   . Ductal carcinoma in situ (DCIS) of left breast   . End stage renal disease ([place])    a. previously on dialysis on Tues, Thurs, & Nov 18, 2022 x 7 yrs; b. s/p deceased donor transplant Apr 18, 2014   . GERD (gastroesophageal reflux disease)   . History of methicillin resistant Staphylococcus aureus infection   . History of stress test    a. 04/2013: normal, EF 65%  . HIT (heparin-induced thrombocytopenia) ([place])   . Hypertension   . Mitral regurgitation    a. 01/2014: EF >55%, LVH, mild MR, dilated LA  . Renal transplant, status post 04-18-14  . RLS (restless legs syndrome)   . Seizure disorder ([place])   . Seizures ([place])   . Sepsis ([place]) 08/09/2016  . Stroke due to intracerebral hemorrhage ([place]) 11/17/2013   HOSPITAL COURSE:  53 year old female patient with multiple medical problems of breast cancer, history of ESRD status post kidney transplant, seizure disorder, essential hypertension comes in with chest pain, palpitations.   #1  Atypical chest pain, possible due to recent stress. troponins are negative, EKG initially showed inferior ST elevations but changes disappeared on after the following EKG, patient is currently chest pain-free. Initially started on heparin drip but  she refused because of history of HIT. Continue aspirin, nitrates, beta-blockers, statins. Defer additional cardiac testing at this time, continue Coreg and follow-up echo per Dr. end.  Follow-up with Dr. Rockey Situ as outpatient. Echocardiograph is unremarkable,LV EF: 65% -   70%.  #2 ESRD, history of renal transplant, renal function is at the baseline, continue Prograf, mycophenolate.  3. History of breast cancer, patient is on tamoxifen.  4. History of seizure disorder, patient does not have any recent seizures, continue Keppra 500 mg p.o. twice daily.  5. Stress, anxiety, benefit from counseling as an outpatient.  #6 diabetes mellitus type 2: resume metformin. DISCHARGE CONDITIONS:  Stable, discharge to home today. CONSULTS OBTAINED:  Treatment Team:  Nelva Bush, MD DRUG ALLERGIES:   Allergies  Allergen Reactions  . Heparin     Heparin induced THrombocytopenia "i bled for a long time and had to have 2 blood transfusions"   . Ibuprofen     Caused kidney damage Can't take due to kidneys  . Other     Other reaction(s): Other (See Comments) Causing HITT  . Penicillins     Childhood reaction Patient is unsure if she's allergic, was told she had a reaction as a child but thinks she's taking a "cillin" while sick without having a reaction.  Pt states she has taken amoxicillin in the past and did not have problem   DISCHARGE MEDICATIONS:   Allergies as of 08/09/2018      Reactions   Heparin    Heparin induced  THrombocytopenia "i bled for a long time and had to have 2 blood transfusions"   Ibuprofen    Caused kidney damage Can't take due to kidneys   Other    Other reaction(s): Other (See Comments) Causing HITT   Penicillins    Childhood reaction Patient is unsure if she's allergic, was told she had a reaction as a child but thinks she's taking a "cillin" while sick without having a reaction.  Pt states she has taken amoxicillin in the past and did not have problem        Medication List    STOP taking these medications   lidocaine 2 % solution Commonly known as:  XYLOCAINE     TAKE these medications   aspirin EC 81 MG tablet Take 81 mg by mouth daily.   carvedilol 12.5 MG tablet Commonly known as:  COREG Take 12.5 mg by mouth 2 (two) times daily with a meal.   cetirizine 10 MG tablet Commonly known as:  ZYRTEC Take 1 tablet (10 mg total) by mouth daily. What changed:    when to take this  reasons to take this   cholecalciferol 25 MCG (1000 UT) tablet Commonly known as:  VITAMIN D Take 1,000 Units by mouth daily.   fluticasone 50 MCG/ACT nasal spray Commonly known as:  FLONASE Two sprays each nare twice a day. What changed:  See the new instructions.   gabapentin 300 MG capsule Commonly known as:  NEURONTIN Take 600 mg by mouth 3 (three) times daily.   levETIRAcetam 500 MG tablet Commonly known as:  KEPPRA Take 500 mg by mouth 2 (two) times daily.   magnesium oxide 400 MG tablet Commonly known as:  MAG-OX Take 400 mg by mouth daily.   metFORMIN 500 MG 24 hr tablet Commonly known as:  GLUCOPHAGE-XR Take 1 tablet (500 mg total) by mouth daily with breakfast.   MYFORTIC 180 MG EC tablet Generic drug:  mycophenolate Take 360 mg by mouth 3 (three) times daily.   nitroGLYCERIN 0.4 MG SL tablet Commonly known as:  NITROSTAT Place 1 tablet (0.4 mg total) under the tongue every 5 (five) minutes x 3 doses as needed for chest pain.   omeprazole 20 MG capsule Commonly known as:  PRILOSEC Take 20 mg by mouth daily as needed (GERD or heartburn).   sodium bicarbonate 650 MG tablet Take 2 tablets (1300 mg) three times a day.   tacrolimus 1 MG capsule Commonly known as:  PROGRAF Take 4 mg by mouth 2 (two) times daily.   tamoxifen 20 MG tablet Commonly known as:  NOLVADEX Take 20 mg by mouth daily.   valACYclovir 1000 MG tablet Commonly known as:  VALTREX Take 1 tablet (1,000 mg total) by mouth 2 (two) times daily.         DISCHARGE INSTRUCTIONS:  See AVS. If you experience worsening of your admission symptoms, develop shortness of breath, life threatening emergency, suicidal or homicidal thoughts you must seek medical attention immediately by calling 911 or calling your MD immediately  if symptoms less severe.  You Must read complete instructions/literature along with all the possible adverse reactions/side effects for all the Medicines you take and that have been prescribed to you. Take any new Medicines after you have completely understood and accpet all the possible adverse reactions/side effects.   Please note  You were cared for by a hospitalist during your hospital stay. If you have any questions about your discharge medications or the care you received while  you were in the hospital after you are discharged, you can call the unit and asked to speak with the hospitalist on call if the hospitalist that took care of you is not available. Once you are discharged, your primary care physician will handle any further medical issues. Please note that NO REFILLS for any discharge medications will be authorized once you are discharged, as it is imperative that you return to your primary care physician (or establish a relationship with a primary care physician if you do not have one) for your aftercare needs so that they can reassess your need for medications and monitor your lab values.    On the day of Discharge:  VITAL SIGNS:  Blood pressure (!) 128/94, pulse 65, temperature 98 F (36.7 C), temperature source Oral, resp. rate 20, height 5\' 5"  (1.651 m), weight 90.3 kg, last menstrual period 03/03/2016, SpO2 99 %. PHYSICAL EXAMINATION:  GENERAL:  53 y.o.-year-old patient lying in the bed with no acute distress.  EYES: Pupils equal, round, reactive to light and accommodation. No scleral icterus. Extraocular muscles intact.  HEENT: Head atraumatic, normocephalic. Oropharynx and nasopharynx clear.  NECK:   Supple, no jugular venous distention. No thyroid enlargement, no tenderness.  LUNGS: Normal breath sounds bilaterally, no wheezing, rales,rhonchi or crepitation. No use of accessory muscles of respiration.  CARDIOVASCULAR: S1, S2 normal. No murmurs, rubs, or gallops.  ABDOMEN: Soft, non-tender, non-distended. Bowel sounds present. No organomegaly or mass.  EXTREMITIES: No pedal edema, cyanosis, or clubbing.  NEUROLOGIC: Cranial nerves II through XII are intact. Muscle strength 5/5 in all extremities. Sensation intact. Gait not checked.  PSYCHIATRIC: The patient is alert and oriented x 3.  SKIN: No obvious rash, lesion, or ulcer.  DATA REVIEW:   CBC Recent Labs  Lab 08/08/18 1610  WBC 6.6  HGB 11.9*  HCT 37.2  PLT 220    Chemistries  Recent Labs  Lab 08/08/18 1610  NA 135  K 4.7  CL 106  CO2 20*  GLUCOSE 170*  BUN 35*  CREATININE 2.27*  CALCIUM 10.6*     Microbiology Results  Results for orders placed or performed in visit on 06/03/17  Microscopic Examination     Status: Abnormal   Collection Time: 06/03/17  3:49 PM  Result Value Ref Range Status   WBC, UA 0-5 0 - 5 /hpf Final   RBC, UA 0-2 0 - 2 /hpf Final   Epithelial Cells (non renal) 0-10 0 - 10 /hpf Final   Bacteria, UA Moderate (A) None seen/Few Final  Urine Culture, Reflex     Status: None   Collection Time: 06/03/17  3:49 PM  Result Value Ref Range Status   Urine Culture, Routine Final report  Final   Organism ID, Bacteria Comment  Final    Comment: No growth in 36 - 48 hours.    RADIOLOGY:  Dg Chest Portable 1 View  Result Date: 08/08/2018 CLINICAL DATA:  Intermittent chest pain and palpitations. EXAM: PORTABLE CHEST 1 VIEW COMPARISON:  Chest x-ray dated August 09, 2016. FINDINGS: The heart size and mediastinal contours are within normal limits. Normal pulmonary vascularity. Mild diffuse peribronchial thickening, similar to prior study. Unchanged linear scarring/atelectasis at the left lung base. No focal  consolidation, pleural effusion, or pneumothorax. No acute osseous abnormality. IMPRESSION: 1. Chronic bronchitic changes.  No active disease. Electronically Signed   By: Titus Dubin M.D.   On: 08/08/2018 17:07     Management plans discussed with the patient, family and they are  in agreement.  CODE STATUS: Full Code   TOTAL TIME TAKING CARE OF THIS PATIENT: 27 minutes.    Demetrios Loll M.D on 08/09/2018 at 12:43 PM  Between 7am to 6pm - Pager - [phone]  After 6pm go to [email or web address] - Proofreader  [facility] [place] [facility]  Office  [phone]  CC: Primary care physician; Valerie Roys, DO   Note: This dictation was prepared with Dragon dictation along with smaller phrase technology. Any transcriptional errors that result from this process are unintentional.

## 2018-08-09 NOTE — Care Management CC44 (Signed)
Condition Code 44 Documentation Completed  Patient Details  Name: [name] MRN: [id number] Date of Birth: August 24, 1965   Condition Code 44 given:  Yes Patient signature on Condition Code 44 notice:  Yes Documentation of 2 MD's agreement:  Yes Code 44 added to claim:  Yes    Elza Rafter, RN 08/09/2018, 12:24 PM

## 2018-08-09 NOTE — Progress Notes (Signed)
Discharge paperwork given to patient. IV taken out and tele monitor off. Prescription given. Patient verbalized understanding with no further questions or complaints. Patient going home in stable condition.

## 2018-08-09 NOTE — Progress Notes (Signed)
Patient is requesting for omeprazole for heartburn. She indicated she takes it as home as needed. Notified Dr. Jerelyn Charles and received new order for it. Will administer and continue to monitor.

## 2018-08-09 NOTE — Progress Notes (Signed)
Patient refused the Protonix after receiving the order.

## 2018-08-10 ENCOUNTER — Telehealth: Payer: Self-pay

## 2018-08-10 LAB — HIV ANTIBODY (ROUTINE TESTING W REFLEX): HIV Screen 4th Generation wRfx: NONREACTIVE

## 2018-08-10 NOTE — Telephone Encounter (Signed)
I have made the 1st attempt to contact the patient or family member in charge, in order to follow up from recently being discharged from the hospital. I left a message on voicemail requesting a CB. -MM 

## 2018-08-11 NOTE — Telephone Encounter (Signed)
I have made the 2nd attempt to contact the patient or family member in charge, in order to follow up from recently being discharged from the hospital. I left a message on voicemail requesting a CB. -MM  

## 2018-08-12 ENCOUNTER — Telehealth: Payer: Self-pay

## 2018-08-12 NOTE — Telephone Encounter (Signed)
Flagged on EMMI report  For having unfilled prescriptions and other questions or problems.  Attempted to speak with patient, however she stated her stomach was hurting and requested I reach her at another time.  Will attempt on 08/15/2018.

## 2018-08-12 NOTE — Telephone Encounter (Signed)
I have tried to contact pt a couple times and LM requesting a CB. Pt did not return the calls or VMs. HFU scheduled 08/16/18 @ 2:30 PM. Juluis Rainier! -MM

## 2018-08-15 NOTE — Telephone Encounter (Signed)
Second attempt made, however unable to reach patient.  Left voicemail encouraging callback if any questions or concerns.  No further attempts at this time.

## 2018-08-16 ENCOUNTER — Encounter: Payer: Self-pay | Admitting: Family Medicine

## 2018-08-16 ENCOUNTER — Ambulatory Visit (INDEPENDENT_AMBULATORY_CARE_PROVIDER_SITE_OTHER): Payer: Medicare Other | Admitting: Family Medicine

## 2018-08-16 VITALS — BP 127/81 | HR 74 | Temp 97.9°F | Ht 65.0 in | Wt 198.0 lb

## 2018-08-16 DIAGNOSIS — N183 Chronic kidney disease, stage 3 unspecified: Secondary | ICD-10-CM

## 2018-08-16 DIAGNOSIS — Z940 Kidney transplant status: Secondary | ICD-10-CM

## 2018-08-16 DIAGNOSIS — E1122 Type 2 diabetes mellitus with diabetic chronic kidney disease: Secondary | ICD-10-CM

## 2018-08-16 DIAGNOSIS — E559 Vitamin D deficiency, unspecified: Secondary | ICD-10-CM

## 2018-08-16 DIAGNOSIS — G40909 Epilepsy, unspecified, not intractable, without status epilepticus: Secondary | ICD-10-CM

## 2018-08-16 DIAGNOSIS — D899 Disorder involving the immune mechanism, unspecified: Secondary | ICD-10-CM

## 2018-08-16 DIAGNOSIS — R0789 Other chest pain: Secondary | ICD-10-CM

## 2018-08-16 DIAGNOSIS — K219 Gastro-esophageal reflux disease without esophagitis: Secondary | ICD-10-CM | POA: Diagnosis not present

## 2018-08-16 DIAGNOSIS — I129 Hypertensive chronic kidney disease with stage 1 through stage 4 chronic kidney disease, or unspecified chronic kidney disease: Secondary | ICD-10-CM

## 2018-08-16 DIAGNOSIS — I5189 Other ill-defined heart diseases: Secondary | ICD-10-CM | POA: Diagnosis not present

## 2018-08-16 DIAGNOSIS — D849 Immunodeficiency, unspecified: Secondary | ICD-10-CM

## 2018-08-16 DIAGNOSIS — D0512 Intraductal carcinoma in situ of left breast: Secondary | ICD-10-CM

## 2018-08-16 DIAGNOSIS — I871 Compression of vein: Secondary | ICD-10-CM

## 2018-08-16 DIAGNOSIS — T829XXA Unspecified complication of cardiac and vascular prosthetic device, implant and graft, initial encounter: Secondary | ICD-10-CM | POA: Insufficient documentation

## 2018-08-16 HISTORY — DX: Compression of vein: I87.1

## 2018-08-16 LAB — UA/M W/RFLX CULTURE, ROUTINE
Bilirubin, UA: NEGATIVE
Glucose, UA: NEGATIVE
KETONES UA: NEGATIVE
Leukocytes, UA: NEGATIVE
Nitrite, UA: NEGATIVE
Protein, UA: NEGATIVE
SPEC GRAV UA: 1.015 (ref 1.005–1.030)
Urobilinogen, Ur: 0.2 mg/dL (ref 0.2–1.0)
pH, UA: 7 (ref 5.0–7.5)

## 2018-08-16 LAB — MICROSCOPIC EXAMINATION
Bacteria, UA: NONE SEEN
RBC, UA: NONE SEEN /hpf (ref 0–2)

## 2018-08-16 LAB — BAYER DCA HB A1C WAIVED: HB A1C (BAYER DCA - WAIVED): 6.6 % (ref <7.0)

## 2018-08-16 LAB — MICROALBUMIN, URINE WAIVED
Creatinine, Urine Waived: 100 mg/dL (ref 10–300)
Microalb, Ur Waived: 80 mg/L — ABNORMAL HIGH (ref 0–19)

## 2018-08-16 MED ORDER — CETIRIZINE HCL 10 MG PO TABS: 10.0000 mg | ORAL_TABLET | Freq: Every day | ORAL | 3 refills | Status: DC | PRN

## 2018-08-16 MED ORDER — ATORVASTATIN CALCIUM 40 MG PO TABS: 40.0000 mg | ORAL_TABLET | Freq: Every day | ORAL | 1 refills | Status: DC

## 2018-08-16 MED ORDER — OMEPRAZOLE 20 MG PO CPDR: 20.0000 mg | DELAYED_RELEASE_CAPSULE | Freq: Every day | ORAL | 1 refills | Status: DC | PRN

## 2018-08-16 MED ORDER — METFORMIN HCL ER 500 MG PO TB24: 500.0000 mg | ORAL_TABLET | Freq: Every day | ORAL | 1 refills | Status: DC

## 2018-08-16 NOTE — Assessment & Plan Note (Signed)
Rechecking levels today. Await results. Call with any concerns.  

## 2018-08-16 NOTE — Assessment & Plan Note (Signed)
On medication due to her kidney transplant. Continue to follow with transplant nephrology. Call with any concerns.

## 2018-08-16 NOTE — Progress Notes (Signed)
BP 127/81   Pulse 74   Temp 97.9 F (36.6 C) (Oral)   Ht 5\' 5"  (1.651 m)   Wt 198 lb (89.8 kg)   LMP 03/03/2016 (Approximate)   SpO2 98%   BMI 32.95 kg/m    Subjective:    Patient ID: [name], female    DOB: 04-Aug-1965, 53 y.o.   MRN: [id number]  HPI: [name] is a 53 y.o. female who presents today for hospital follow up after being lost to follow up here for almost a year.   Chief Complaint  Patient presents with  . Hospitalization Follow-up    Chest pain. Denied reoccurence   Transition of [facility] Follow up.   Hospital/Facility: [facility] D/C Physician: Dr. Bridgett Larsson D/C Date: 08/08/18  Records Requested: 08/16/18  Records Received: 08/16/18  Records Reviewed: 08/16/18   Diagnoses on Discharge: Chest pain, Atypical Chest Pain  Date of interactive Contact within 48 hours of discharge: NOT DONE ATTEMPTED 3x AND PATIENT NEVER ANSWERED/CALLED BACK Contact was through: phone  Date of 7 day or 14 day face-to-face visit:  08/16/17  within 7 days  Outpatient Encounter Medications as of 08/16/2018  Medication Sig  . aspirin EC 81 MG tablet Take 81 mg by mouth daily.  . carvedilol (COREG) 12.5 MG tablet Take 12.5 mg by mouth 2 (two) times daily with a meal.   . cetirizine (ZYRTEC) 10 MG tablet Take 1 tablet (10 mg total) by mouth daily as needed for allergies or rhinitis.  . cholecalciferol (VITAMIN D) 25 MCG (1000 UT) tablet Take 1,000 Units by mouth daily.  . fluticasone (FLONASE) 50 MCG/ACT nasal spray Two sprays each nare twice a day. (Patient taking differently: Place 2 sprays into both nostrils daily. Marland Kitchen)  . gabapentin (NEURONTIN) 300 MG capsule Take 600 mg by mouth 3 (three) times daily.   Marland Kitchen levETIRAcetam (KEPPRA) 500 MG tablet Take 500 mg by mouth 2 (two) times daily.   . magnesium oxide (MAG-OX) 400 MG tablet Take 400 mg by mouth daily.   . metFORMIN (GLUCOPHAGE-XR) 500 MG 24 hr tablet Take 1 tablet (500 mg total) by mouth daily with breakfast.  .  mycophenolate (MYFORTIC) 180 MG EC tablet Take 360 mg by mouth 3 (three) times daily.   . nitroGLYCERIN (NITROSTAT) 0.4 MG SL tablet Place 1 tablet (0.4 mg total) under the tongue every 5 (five) minutes x 3 doses as needed for chest pain.  Marland Kitchen omeprazole (PRILOSEC) 20 MG capsule Take 1 capsule (20 mg total) by mouth daily as needed (GERD or heartburn).  . sodium bicarbonate 650 MG tablet Take 2 tablets (1300 mg) three times a day.  . tacrolimus (PROGRAF) 1 MG capsule Take 4 mg by mouth 2 (two) times daily.   . tamoxifen (NOLVADEX) 20 MG tablet Take 20 mg by mouth daily.  . valACYclovir (VALTREX) 1000 MG tablet Take 1 tablet (1,000 mg total) by mouth 2 (two) times daily.  . [DISCONTINUED] cetirizine (ZYRTEC) 10 MG tablet Take 1 tablet (10 mg total) by mouth daily. (Patient taking differently: Take 10 mg by mouth daily as needed for allergies or rhinitis. )  . [DISCONTINUED] metFORMIN (GLUCOPHAGE-XR) 500 MG 24 hr tablet Take 1 tablet (500 mg total) by mouth daily with breakfast.  . [DISCONTINUED] omeprazole (PRILOSEC) 20 MG capsule Take 20 mg by mouth daily as needed (GERD or heartburn).   Marland Kitchen atorvastatin (LIPITOR) 40 MG tablet Take 1 tablet (40 mg total) by mouth daily.   No facility-administered encounter medications  on file as of 08/16/2018.   Per Hospitalist: " HOSPITAL COURSE:  53 year old female patient with multiple medical problems of breast cancer, history of ESRD status post kidney transplant, seizure disorder, essential hypertension comes in with chest pain, palpitations.   #1  Atypical chest pain, possible due to recent stress. troponins are negative, EKG initially showed inferior ST elevations but changes disappeared on after the following EKG, patient is currently chest pain-free. 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 Continue aspirin, nitrates, beta-blockers, statins. Defer additional cardiac testing at this time, continue Coreg and follow-up echo  per Dr. end.  Follow-up with Dr. Rockey Situ as outpatient. Echocardiograph is unremarkable,LV EF: 65% - 70%.  #2 ESRD, history of renal transplant, renal function is at the baseline, continue Prograf, mycophenolate.  3. History of breast cancer, patient is on tamoxifen.  4. History of seizure disorder, patient does not have any recent seizures, continue Keppra 500 mg p.o. twice daily.  5. Stress, anxiety, benefit from counseling as an outpatient.  #6 diabetes mellitus type 2: resume metformin."  Diagnostic Tests Reviewed:  CLINICAL DATA:  Intermittent chest pain and palpitations.  EXAM: PORTABLE CHEST 1 VIEW  COMPARISON:  Chest x-ray dated August 09, 2016.  FINDINGS: The heart size and mediastinal contours are within normal limits. Normal pulmonary vascularity. Mild diffuse peribronchial thickening, similar to prior study. Unchanged linear scarring/atelectasis at the left lung base. No focal consolidation, pleural effusion, or pneumothorax. No acute osseous abnormality.  IMPRESSION: 1. Chronic bronchitic changes.  No active disease.          *[place], [place] [id number]                            [phone]  ------------------------------------------------------------------- Transthoracic Echocardiography  Patient:    [name], [name] MR #:       [id number] Study Date: 08/09/2018 Gender:     F Age:        62 Height:     165.1 cm Weight:     90.3 kg BSA:        2.07 m^2 Pt. Status: Room:   ATTENDING    Nelva Bush, MD  ORDERING     Nelva Bush, MD  REFERRING    Nelva Bush, MD  SONOGRAPHER  Sherrie Sport RDCS  PERFORMING   [facility]  cc:  ------------------------------------------------------------------- LV EF: 65% -   70%  ------------------------------------------------------------------- Indications:      Cest pain  786.50.  ------------------------------------------------------------------- History:   PMH:  Dialysis patient, arrhythmia, CVA, ESRD, GERD, mitral regurgitation, seizures, sepsis.  Risk factors: Hypertension. Diabetes mellitus.  ------------------------------------------------------------------- Study Conclusions  - Left ventricle: The cavity size was normal. Wall thickness was   increased in a pattern of mild LVH. There was moderate focal   basal hypertrophy. Systolic function was vigorous. The estimated   ejection fraction was in the range of 65% to 70%. Wall motion was   normal; there were no regional wall motion abnormalities. Doppler   parameters are consistent with abnormal left ventricular   relaxation (grade 1 diastolic dysfunction). - Left atrium: The atrium was mildly dilated. - Right  ventricle: The cavity size was normal. Wall thickness was   normal. Systolic function was normal.  ------------------------------------------------------------------- Study data:   Study status:  Routine.  Procedure:  The patient reported no pain pre or post test. Transthoracic echocardiography for left ventricular function evaluation, for right ventricular function evaluation, and for assessment of valvular function. Image quality was adequate.  Study completion:  There were no complications.          Transthoracic echocardiography.  M-mode, complete 2D, spectral Doppler, and color Doppler.  Birthdate: Patient birthdate: [birth year].  Age:  Patient is 53 yr old.  Sex: Gender: female.    BMI: 33.1 kg/m^2.  Blood pressure:     128/94 Patient status:  Inpatient.  Study date:  Study date: 08/09/2018. Study time: 03:07 PM.  Location:  Bedside.  -------------------------------------------------------------------  ------------------------------------------------------------------- Left ventricle:  The cavity size was normal. Wall thickness was increased in a pattern of mild LVH. There was  moderate focal basal hypertrophy. Systolic function was vigorous. The estimated ejection fraction was in the range of 65% to 70%. Wall motion was normal; there were no regional wall motion abnormalities. Doppler parameters are consistent with abnormal left ventricular relaxation (grade 1 diastolic dysfunction).  ------------------------------------------------------------------- Aortic valve:   Probably trileaflet; mildly thickened leaflets. Cusp separation was normal.  Doppler:  Transvalvular velocity was within the normal range. There was no stenosis. There was no regurgitation.    VTI ratio of LVOT to aortic valve: 0.93. Valve area (VTI): 2.64 cm^2. Indexed valve area (VTI): 1.28 cm^2/m^2. Peak velocity ratio of LVOT to aortic valve: 0.75. Valve area (Vmax): 2.12 cm^2. Indexed valve area (Vmax): 1.02 cm^2/m^2. Mean velocity ratio of LVOT to aortic valve: 0.77. Valve area (Vmean): 2.18 cm^2. Indexed valve area (Vmean): 1.05 cm^2/m^2.    Mean gradient (S): 5 mm Hg. Peak gradient (S): 8 mm Hg.  ------------------------------------------------------------------- Aorta:  Aortic root: The aortic root was normal in size.  ------------------------------------------------------------------- Mitral valve:   Structurally normal valve.   Leaflet separation was normal.  Doppler:  Transvalvular velocity was within the normal range. There was no evidence for stenosis. There was trivial regurgitation.    Valve area by pressure half-time: 2.75 cm^2. Indexed valve area by pressure half-time: 1.33 cm^2/m^2.  ------------------------------------------------------------------- Left atrium:  The atrium was mildly dilated.  ------------------------------------------------------------------- Right ventricle:  The cavity size was normal. Wall thickness was normal. Systolic function was normal.  ------------------------------------------------------------------- Pulmonic valve:   Poorly  visualized.  Doppler:  Transvalvular velocity was within the normal range. There was no evidence for stenosis. There was no significant regurgitation.  ------------------------------------------------------------------- Tricuspid valve:  Poorly visualized.  Doppler:  There was trivial regurgitation.  ------------------------------------------------------------------- Pulmonary artery:   Poorly visualized.  Systolic pressure could not be accurately estimated.  ------------------------------------------------------------------- Right atrium:  The atrium was normal in size.  ------------------------------------------------------------------- Pericardium:  There was no significant pericardial effusion.  ------------------------------------------------------------------- Systemic veins: Inferior vena cava: Not well visualized.   Disposition: Home  Consults: Cardiology  Discharge Instructions:  Follow up here and with cardiology  Disease/illness Education:  Given today in writing  Home Health/Community Services Discussions/Referrals: N/A  Establishment or re-establishment of referral orders for community resources: N/A  Discussion with other health care providers: None  Assessment and Support of treatment regimen adherence: Good  Appointments Coordinated with: Patient  Education for self-management, independent living, and ADLs: Given today  Doing very well since she got out of the hospital. Has had no chest pain. Had been under a lot of stress and  had some chest pain. She is due to follow up with cardiology on 08/23/18.  HYPERTENSION / HYPERLIPIDEMIA Satisfied with current treatment? yes Duration of hypertension: chronic BP monitoring frequency: not checking BP medication side effects: no Past BP meds: carvedilol Medication compliance: good compliance Aspirin: yes Recent stressors: yes Recurrent headaches: no Visual changes: no Palpitations: no Dyspnea:  no Chest pain: no Lower extremity edema: no Dizzy/lightheaded: no  DIABETES Hypoglycemic episodes:no Polydipsia/polyuria: no Visual disturbance: no Chest pain: no Paresthesias: no Glucose Monitoring: yes  Accucheck frequency: BID  Fasting glucose: 118-120s Taking Insulin?: no Blood Pressure Monitoring: not checking Retinal Examination: Not up to Date Foot Exam: Done today Diabetic Education: Completed Pneumovax: Up to Date Influenza: Up to Date Aspirin: yes   Relevant past medical, surgical, family and social history reviewed and updated as indicated. Interim medical history since our last visit reviewed. Allergies and medications reviewed and updated.  Review of Systems  Constitutional: Negative.   Respiratory: Negative.   Cardiovascular: Negative.   Musculoskeletal: Negative.   Neurological: Negative.   Psychiatric/Behavioral: Negative.     Per HPI unless specifically indicated above     Objective:    BP 127/81   Pulse 74   Temp 97.9 F (36.6 C) (Oral)   Ht 5\' 5"  (1.651 m)   Wt 198 lb (89.8 kg)   LMP 03/03/2016 (Approximate)   SpO2 98%   BMI 32.95 kg/m   Wt Readings from Last 3 Encounters:  08/16/18 198 lb (89.8 kg)  08/09/18 199 lb 1.6 oz (90.3 kg)  02/10/18 208 lb 9.6 oz (94.6 kg)    Physical Exam Vitals signs and nursing note reviewed.  Constitutional:      General: She is not in acute distress.    Appearance: Normal appearance. She is not ill-appearing, toxic-appearing or diaphoretic.  HENT:     Head: Normocephalic and atraumatic.     Right Ear: External ear normal.     Left Ear: External ear normal.     Nose: Nose normal.     Mouth/Throat:     Mouth: Mucous membranes are moist.     Pharynx: Oropharynx is clear.  Eyes:     General: No scleral icterus.       Right eye: No discharge.        Left eye: No discharge.     Extraocular Movements: Extraocular movements intact.     Conjunctiva/sclera: Conjunctivae normal.     Pupils: Pupils are  equal, round, and reactive to light.  Neck:     Musculoskeletal: Normal range of motion and neck supple.  Cardiovascular:     Rate and Rhythm: Normal rate and regular rhythm.     Pulses: Normal pulses.     Heart sounds: Normal heart sounds. No murmur. No friction rub. No gallop.   Pulmonary:     Effort: Pulmonary effort is normal. No respiratory distress.     Breath sounds: Normal breath sounds. No stridor. No wheezing, rhonchi or rales.  Chest:     Chest wall: No tenderness.  Musculoskeletal: Normal range of motion.  Skin:    General: Skin is warm and dry.     Capillary Refill: Capillary refill takes less than 2 seconds.     Coloration: Skin is not jaundiced or pale.     Findings: No bruising, erythema, lesion or rash.  Neurological:     General: No focal deficit present.     Mental Status: She is alert and oriented to person, place, and time. Mental  status is at baseline.  Psychiatric:        Mood and Affect: Mood normal.        Behavior: Behavior normal.        Thought Content: Thought content normal.        Judgment: Judgment normal.     Results for orders placed or performed during the hospital encounter of 56/43/32  Basic metabolic panel  Result Value Ref Range   Sodium 135 135 - 145 mmol/L   Potassium 4.7 3.5 - 5.1 mmol/L   Chloride 106 98 - 111 mmol/L   CO2 20 (L) 22 - 32 mmol/L   Glucose, Bld 170 (H) 70 - 99 mg/dL   BUN 35 (H) 6 - 20 mg/dL   Creatinine, Ser 2.27 (H) 0.44 - 1.00 mg/dL   Calcium 10.6 (H) 8.9 - 10.3 mg/dL   GFR calc non Af Amer 24 (L) >60 mL/min   GFR calc Af Amer 28 (L) >60 mL/min   Anion gap 9 5 - 15  CBC  Result Value Ref Range   WBC 6.6 4.0 - 10.5 K/uL   RBC 3.87 3.87 - 5.11 MIL/uL   Hemoglobin 11.9 (L) 12.0 - 15.0 g/dL   HCT 37.2 36.0 - 46.0 %   MCV 96.1 80.0 - 100.0 fL   MCH 30.7 26.0 - 34.0 pg   MCHC 32.0 30.0 - 36.0 g/dL   RDW 12.4 11.5 - 15.5 %   Platelets 220 150 - 400 K/uL   nRBC 0.0 0.0 - 0.2 %  Troponin I - ONCE - STAT   Result Value Ref Range   Troponin I <0.03 <0.03 ng/mL  Glucose, capillary  Result Value Ref Range   Glucose-Capillary 150 (H) 70 - 99 mg/dL  HIV antibody (Routine Testing)  Result Value Ref Range   HIV Screen 4th Generation wRfx Non Reactive Non Reactive  Troponin I - Now Then Q6H  Result Value Ref Range   Troponin I <0.03 <0.03 ng/mL  Troponin I - Now Then Q6H  Result Value Ref Range   Troponin I <0.03 <0.03 ng/mL  Troponin I - Now Then Q6H  Result Value Ref Range   Troponin I <0.03 <0.03 ng/mL  APTT  Result Value Ref Range   aPTT 28 24 - 36 seconds  Protime-INR  Result Value Ref Range   Prothrombin Time 13.2 11.4 - 15.2 seconds   INR 1.01   APTT  Result Value Ref Range   aPTT 28 24 - 36 seconds  Glucose, capillary  Result Value Ref Range   Glucose-Capillary 142 (H) 70 - 99 mg/dL  Glucose, capillary  Result Value Ref Range   Glucose-Capillary 129 (H) 70 - 99 mg/dL   Comment 1 Notify RN    Comment 2 Document in Chart   Urine Drug Screen, Qualitative ([facility] only)  Result Value Ref Range   Tricyclic, Ur Screen NONE DETECTED NONE DETECTED   Amphetamines, Ur Screen NONE DETECTED NONE DETECTED   MDMA (Ecstasy)Ur Screen NONE DETECTED NONE DETECTED   Cocaine Metabolite,Ur [place] NONE DETECTED NONE DETECTED   Opiate, Ur Screen NONE DETECTED NONE DETECTED   Phencyclidine (PCP) Ur S NONE DETECTED NONE DETECTED   Cannabinoid 50 Ng, Ur [place] NONE DETECTED NONE DETECTED   Barbiturates, Ur Screen NONE DETECTED NONE DETECTED   Benzodiazepine, Ur Scrn NONE DETECTED NONE DETECTED   Methadone Scn, Ur NONE DETECTED NONE DETECTED  Glucose, capillary  Result Value Ref Range   Glucose-Capillary 145 (H) 70 - 99 mg/dL  Comment 1 Notify RN    Comment 2 Document in Chart   Glucose, capillary  Result Value Ref Range   Glucose-Capillary 153 (H) 70 - 99 mg/dL   Comment 1 Notify RN    Comment 2 Document in Chart   ECHOCARDIOGRAM COMPLETE  Result Value Ref Range   Weight 3,185.6 oz   Height  65 in   BP 128/94 mmHg      Assessment & Plan:   Problem List Items Addressed This Visit      Digestive   GERD (gastroesophageal reflux disease)    Under good control on current regimen. Continue current regimen. Continue to monitor. Call with any concerns. Refills given. Checking labs today.       Relevant Medications   omeprazole (PRILOSEC) 20 MG capsule   Other Relevant Orders   CBC with Differential/Platelet   Comprehensive metabolic panel   Microalbumin, Urine Waived   TSH   UA/M w/rflx Culture, Routine     Endocrine   DM (diabetes mellitus), type 2 with renal complications ([place])    Doing well with A1c of 6.6. Continue current regimen. Continue to monitor. Starting atorvastatin. Recheck 1 month.       Relevant Medications   atorvastatin (LIPITOR) 40 MG tablet   metFORMIN (GLUCOPHAGE-XR) 500 MG 24 hr tablet   Other Relevant Orders   Bayer DCA Hb A1c Waived   CBC with Differential/Platelet   Comprehensive metabolic panel   Lipid Panel w/o Chol/HDL Ratio   Microalbumin, Urine Waived   TSH   UA/M w/rflx Culture, Routine   Ambulatory referral to Ophthalmology     Nervous and Auditory   Seizure disorder ([place])    No seizures. Continue to follow with neuro. Checking keppra levels today. Await results.       Relevant Orders   CBC with Differential/Platelet   Comprehensive metabolic panel   Microalbumin, Urine Waived   TSH   UA/M w/rflx Culture, Routine   Levetiracetam level     Genitourinary   Benign hypertensive renal disease    Under good control on her carvedilol. Continue to monitor. Call with any concerns.       CKD (chronic kidney disease) stage 3, GFR 30-59 ml/min ([facility])    Continue to follow with nephrology. Call with any concerns. Rechecking levels today.       Relevant Orders   CBC with Differential/Platelet   Comprehensive metabolic panel   Microalbumin, Urine Waived   TSH   UA/M w/rflx Culture, Routine     Other   Renal transplant, status  post    On medication due to her kidney transplant. Continue to follow with transplant nephrology. Call with any concerns.       Relevant Orders   CBC with Differential/Platelet   Comprehensive metabolic panel   Microalbumin, Urine Waived   TSH   UA/M w/rflx Culture, Routine   Ductal carcinoma in situ (DCIS) of left breast    On tamoxifen. Stable. Continue to monitor. Continue to follow with oncology. Call with any concerns.        Relevant Orders   CBC with Differential/Platelet   Comprehensive metabolic panel   Microalbumin, Urine Waived   TSH   UA/M w/rflx Culture, Routine   Vitamin D deficiency    Rechecking levels today. Await results. Call with any concerns.       Relevant Orders   CBC with Differential/Platelet   Comprehensive metabolic panel   Microalbumin, Urine Waived   TSH   UA/M w/rflx  Culture, Routine   VITAMIN D 25 Hydroxy (Vit-D Deficiency, Fractures)   Atypical chest pain - Primary    Resolved entirely. Thought to be due to stress. Will follow up with cardiology- due to see them next week. Continue to monitor.       Diastolic dysfunction without heart failure    On ECHO done in the hospital. Continue to follow with cardiology. Call with any concerns.       Relevant Orders   CBC with Differential/Platelet   Comprehensive metabolic panel   Microalbumin, Urine Waived   TSH   UA/M w/rflx Culture, Routine   Immunosuppressed status ([place])    On medication due to her kidney transplant. Continue to follow with transplant nephrology. Call with any concerns.           Follow up plan: Return in about 4 weeks (around 09/13/2018) for Physical/follow up cholesterol.

## 2018-08-16 NOTE — Assessment & Plan Note (Signed)
Under good control on current regimen. Continue current regimen. Continue to monitor. Call with any concerns. Refills given. Checking labs today.  

## 2018-08-16 NOTE — Assessment & Plan Note (Addendum)
Doing well with A1c of 6.6. Continue current regimen. Continue to monitor. Starting atorvastatin. Recheck 1 month.

## 2018-08-16 NOTE — Assessment & Plan Note (Signed)
On tamoxifen. Stable. Continue to monitor. Continue to follow with oncology. Call with any concerns.

## 2018-08-16 NOTE — Assessment & Plan Note (Signed)
Under good control on her carvedilol. Continue to monitor. Call with any concerns.

## 2018-08-16 NOTE — Assessment & Plan Note (Signed)
Resolved entirely. Thought to be due to stress. Will follow up with cardiology- due to see them next week. Continue to monitor.

## 2018-08-16 NOTE — Assessment & Plan Note (Signed)
On ECHO done in the hospital. Continue to follow with cardiology. Call with any concerns.

## 2018-08-16 NOTE — Assessment & Plan Note (Signed)
Continue to follow with nephrology. Call with any concerns. Rechecking levels today.

## 2018-08-16 NOTE — Assessment & Plan Note (Signed)
No seizures. Continue to follow with neuro. Checking keppra levels today. Await results.

## 2018-08-18 ENCOUNTER — Encounter: Payer: Self-pay | Admitting: Family Medicine

## 2018-08-18 LAB — CBC WITH DIFFERENTIAL/PLATELET
Basophils Absolute: 0 10*3/uL (ref 0.0–0.2)
Basos: 1 %
EOS (ABSOLUTE): 0.2 10*3/uL (ref 0.0–0.4)
Eos: 3 %
Hematocrit: 34.6 % (ref 34.0–46.6)
Hemoglobin: 11.4 g/dL (ref 11.1–15.9)
Immature Grans (Abs): 0 10*3/uL (ref 0.0–0.1)
Immature Granulocytes: 0 %
Lymphocytes Absolute: 1.5 10*3/uL (ref 0.7–3.1)
Lymphs: 27 %
MCH: 30.9 pg (ref 26.6–33.0)
MCHC: 32.9 g/dL (ref 31.5–35.7)
MCV: 94 fL (ref 79–97)
Monocytes Absolute: 0.4 10*3/uL (ref 0.1–0.9)
Monocytes: 7 %
Neutrophils Absolute: 3.5 10*3/uL (ref 1.4–7.0)
Neutrophils: 62 %
PLATELETS: 302 10*3/uL (ref 150–450)
RBC: 3.69 x10E6/uL — ABNORMAL LOW (ref 3.77–5.28)
RDW: 12.2 % (ref 11.7–15.4)
WBC: 5.7 10*3/uL (ref 3.4–10.8)

## 2018-08-18 LAB — COMPREHENSIVE METABOLIC PANEL
ALT: 11 IU/L (ref 0–32)
AST: 12 IU/L (ref 0–40)
Albumin/Globulin Ratio: 1.6 (ref 1.2–2.2)
Albumin: 4.4 g/dL (ref 3.5–5.5)
Alkaline Phosphatase: 44 IU/L (ref 39–117)
BUN/Creatinine Ratio: 17 (ref 9–23)
BUN: 32 mg/dL — ABNORMAL HIGH (ref 6–24)
Bilirubin Total: <0.2 mg/dL (ref 0.0–1.2)
CO2: 17 mmol/L — ABNORMAL LOW (ref 20–29)
Calcium: 10.3 mg/dL — ABNORMAL HIGH (ref 8.7–10.2)
Chloride: 103 mmol/L (ref 96–106)
Creatinine, Ser: 1.9 mg/dL — ABNORMAL HIGH (ref 0.57–1.00)
GFR calc Af Amer: 35 mL/min/{1.73_m2} — ABNORMAL LOW (ref >59)
GFR calc non Af Amer: 30 mL/min/{1.73_m2} — ABNORMAL LOW (ref >59)
GLUCOSE: 136 mg/dL — AB (ref 65–99)
Globulin, Total: 2.8 g/dL (ref 1.5–4.5)
Potassium: 5.5 mmol/L — ABNORMAL HIGH (ref 3.5–5.2)
SODIUM: 136 mmol/L (ref 134–144)
Total Protein: 7.2 g/dL (ref 6.0–8.5)

## 2018-08-18 LAB — LIPID PANEL W/O CHOL/HDL RATIO
Cholesterol, Total: 214 mg/dL — ABNORMAL HIGH (ref 100–199)
HDL: 28 mg/dL — ABNORMAL LOW (ref >39)
Triglycerides: 421 mg/dL — ABNORMAL HIGH (ref 0–149)

## 2018-08-18 LAB — VITAMIN D 25 HYDROXY (VIT D DEFICIENCY, FRACTURES): VIT D 25 HYDROXY: 27.6 ng/mL — AB (ref 30.0–100.0)

## 2018-08-18 LAB — LEVETIRACETAM LEVEL: Levetiracetam Lvl: 22.5 ug/mL (ref 10.0–40.0)

## 2018-08-18 LAB — TSH: TSH: 1.06 u[IU]/mL (ref 0.450–4.500)

## 2018-08-21 ENCOUNTER — Inpatient Hospital Stay
Admission: EM | Admit: 2018-08-21 | Discharge: 2018-08-24 | DRG: 392 | Disposition: A | Discharge status: Home / Self Care | Payer: Medicare Other | Attending: Internal Medicine | Admitting: Internal Medicine

## 2018-08-21 ENCOUNTER — Other Ambulatory Visit: Payer: Self-pay

## 2018-08-21 ENCOUNTER — Emergency Department: Payer: Medicare Other

## 2018-08-21 DIAGNOSIS — Z7951 Long term (current) use of inhaled steroids: Secondary | ICD-10-CM

## 2018-08-21 DIAGNOSIS — Z862 Personal history of diseases of the blood and blood-forming organs and certain disorders involving the immune mechanism: Secondary | ICD-10-CM | POA: Diagnosis not present

## 2018-08-21 DIAGNOSIS — D72829 Elevated white blood cell count, unspecified: Secondary | ICD-10-CM | POA: Diagnosis not present

## 2018-08-21 DIAGNOSIS — Z822 Family history of deafness and hearing loss: Secondary | ICD-10-CM | POA: Diagnosis not present

## 2018-08-21 DIAGNOSIS — Z940 Kidney transplant status: Secondary | ICD-10-CM | POA: Diagnosis not present

## 2018-08-21 DIAGNOSIS — G40909 Epilepsy, unspecified, not intractable, without status epilepticus: Secondary | ICD-10-CM | POA: Diagnosis present

## 2018-08-21 DIAGNOSIS — G2581 Restless legs syndrome: Secondary | ICD-10-CM | POA: Diagnosis present

## 2018-08-21 DIAGNOSIS — Z8619 Personal history of other infectious and parasitic diseases: Secondary | ICD-10-CM

## 2018-08-21 DIAGNOSIS — Z888 Allergy status to other drugs, medicaments and biological substances status: Secondary | ICD-10-CM

## 2018-08-21 DIAGNOSIS — I10 Essential (primary) hypertension: Secondary | ICD-10-CM | POA: Diagnosis not present

## 2018-08-21 DIAGNOSIS — K5732 Diverticulitis of large intestine without perforation or abscess without bleeding: Secondary | ICD-10-CM | POA: Diagnosis not present

## 2018-08-21 DIAGNOSIS — Z820 Family history of epilepsy and other diseases of the nervous system: Secondary | ICD-10-CM

## 2018-08-21 DIAGNOSIS — R1032 Left lower quadrant pain: Secondary | ICD-10-CM | POA: Diagnosis present

## 2018-08-21 DIAGNOSIS — Z803 Family history of malignant neoplasm of breast: Secondary | ICD-10-CM | POA: Diagnosis not present

## 2018-08-21 DIAGNOSIS — Z87891 Personal history of nicotine dependence: Secondary | ICD-10-CM | POA: Diagnosis not present

## 2018-08-21 DIAGNOSIS — Z8673 Personal history of transient ischemic attack (TIA), and cerebral infarction without residual deficits: Secondary | ICD-10-CM | POA: Diagnosis not present

## 2018-08-21 DIAGNOSIS — K5792 Diverticulitis of intestine, part unspecified, without perforation or abscess without bleeding: Secondary | ICD-10-CM

## 2018-08-21 DIAGNOSIS — E119 Type 2 diabetes mellitus without complications: Secondary | ICD-10-CM | POA: Diagnosis not present

## 2018-08-21 DIAGNOSIS — J181 Lobar pneumonia, unspecified organism: Secondary | ICD-10-CM | POA: Diagnosis not present

## 2018-08-21 DIAGNOSIS — Z8614 Personal history of Methicillin resistant Staphylococcus aureus infection: Secondary | ICD-10-CM

## 2018-08-21 DIAGNOSIS — Z7984 Long term (current) use of oral hypoglycemic drugs: Secondary | ICD-10-CM

## 2018-08-21 DIAGNOSIS — Z8249 Family history of ischemic heart disease and other diseases of the circulatory system: Secondary | ICD-10-CM | POA: Diagnosis not present

## 2018-08-21 DIAGNOSIS — K219 Gastro-esophageal reflux disease without esophagitis: Secondary | ICD-10-CM | POA: Diagnosis present

## 2018-08-21 DIAGNOSIS — Z79899 Other long term (current) drug therapy: Secondary | ICD-10-CM

## 2018-08-21 DIAGNOSIS — Z823 Family history of stroke: Secondary | ICD-10-CM

## 2018-08-21 DIAGNOSIS — I340 Nonrheumatic mitral (valve) insufficiency: Secondary | ICD-10-CM | POA: Diagnosis present

## 2018-08-21 DIAGNOSIS — Z7982 Long term (current) use of aspirin: Secondary | ICD-10-CM

## 2018-08-21 DIAGNOSIS — Z841 Family history of disorders of kidney and ureter: Secondary | ICD-10-CM | POA: Diagnosis not present

## 2018-08-21 DIAGNOSIS — Z8349 Family history of other endocrine, nutritional and metabolic diseases: Secondary | ICD-10-CM | POA: Diagnosis not present

## 2018-08-21 DIAGNOSIS — E871 Hypo-osmolality and hyponatremia: Secondary | ICD-10-CM | POA: Diagnosis present

## 2018-08-21 DIAGNOSIS — Z9012 Acquired absence of left breast and nipple: Secondary | ICD-10-CM

## 2018-08-21 DIAGNOSIS — D0512 Intraductal carcinoma in situ of left breast: Secondary | ICD-10-CM | POA: Diagnosis present

## 2018-08-21 DIAGNOSIS — Z79810 Long term (current) use of selective estrogen receptor modulators (SERMs): Secondary | ICD-10-CM

## 2018-08-21 DIAGNOSIS — R1012 Left upper quadrant pain: Secondary | ICD-10-CM | POA: Diagnosis not present

## 2018-08-21 DIAGNOSIS — R1013 Epigastric pain: Secondary | ICD-10-CM | POA: Diagnosis not present

## 2018-08-21 DIAGNOSIS — Z833 Family history of diabetes mellitus: Secondary | ICD-10-CM

## 2018-08-21 DIAGNOSIS — Z886 Allergy status to analgesic agent status: Secondary | ICD-10-CM

## 2018-08-21 HISTORY — DX: Diverticulitis of intestine, part unspecified, without perforation or abscess without bleeding: K57.92

## 2018-08-21 LAB — COMPREHENSIVE METABOLIC PANEL
ALT: 12 U/L (ref 0–44)
ANION GAP: 7 (ref 5–15)
AST: 14 U/L — ABNORMAL LOW (ref 15–41)
Albumin: 4.2 g/dL (ref 3.5–5.0)
Alkaline Phosphatase: 43 U/L (ref 38–126)
BUN: 30 mg/dL — ABNORMAL HIGH (ref 6–20)
CO2: 20 mmol/L — ABNORMAL LOW (ref 22–32)
Calcium: 10.2 mg/dL (ref 8.9–10.3)
Chloride: 105 mmol/L (ref 98–111)
Creatinine, Ser: 2.2 mg/dL — ABNORMAL HIGH (ref 0.44–1.00)
GFR calc Af Amer: 29 mL/min — ABNORMAL LOW (ref >60)
GFR calc non Af Amer: 25 mL/min — ABNORMAL LOW (ref >60)
Glucose, Bld: 133 mg/dL — ABNORMAL HIGH (ref 70–99)
Potassium: 5 mmol/L (ref 3.5–5.1)
Sodium: 132 mmol/L — ABNORMAL LOW (ref 135–145)
Total Bilirubin: 0.5 mg/dL (ref 0.3–1.2)
Total Protein: 8.2 g/dL — ABNORMAL HIGH (ref 6.5–8.1)

## 2018-08-21 LAB — CBC WITH DIFFERENTIAL/PLATELET
Abs Immature Granulocytes: 0.09 10*3/uL — ABNORMAL HIGH (ref 0.00–0.07)
BASOS ABS: 0.1 10*3/uL (ref 0.0–0.1)
Basophils Relative: 0 %
Eosinophils Absolute: 0.1 10*3/uL (ref 0.0–0.5)
Eosinophils Relative: 0 %
HCT: 37 % (ref 36.0–46.0)
Hemoglobin: 11.8 g/dL — ABNORMAL LOW (ref 12.0–15.0)
IMMATURE GRANULOCYTES: 1 %
Lymphocytes Relative: 10 %
Lymphs Abs: 1.6 10*3/uL (ref 0.7–4.0)
MCH: 30.8 pg (ref 26.0–34.0)
MCHC: 31.9 g/dL (ref 30.0–36.0)
MCV: 96.6 fL (ref 80.0–100.0)
Monocytes Absolute: 1.4 10*3/uL — ABNORMAL HIGH (ref 0.1–1.0)
Monocytes Relative: 9 %
NEUTROS ABS: 13.2 10*3/uL — AB (ref 1.7–7.7)
NRBC: 0 % (ref 0.0–0.2)
Neutrophils Relative %: 80 %
Platelets: 315 10*3/uL (ref 150–400)
RBC: 3.83 MIL/uL — ABNORMAL LOW (ref 3.87–5.11)
RDW: 12.5 % (ref 11.5–15.5)
WBC: 16.4 10*3/uL — ABNORMAL HIGH (ref 4.0–10.5)

## 2018-08-21 LAB — LIPASE, BLOOD: Lipase: 25 U/L (ref 11–51)

## 2018-08-21 LAB — CBC

## 2018-08-21 LAB — GLUCOSE, CAPILLARY: Glucose-Capillary: 85 mg/dL (ref 70–99)

## 2018-08-21 LAB — TROPONIN I: Troponin I: <0.03 ng/mL (ref <0.03)

## 2018-08-21 MED ORDER — ACETAMINOPHEN 650 MG RE SUPP: 650.0000 mg | Freq: Four times a day (QID) | RECTAL | Status: DC | PRN

## 2018-08-21 MED ORDER — METRONIDAZOLE IN NACL 5-0.79 MG/ML-% IV SOLN: 500.0000 mg | Freq: Once | INTRAVENOUS | Status: DC

## 2018-08-21 MED ORDER — ASPIRIN EC 81 MG PO TBEC
81.0000 mg | DELAYED_RELEASE_TABLET | Freq: Every day | ORAL | Status: DC
  Administered 2018-08-22 – 2018-08-24 (×3): 81 mg via ORAL
  Filled 2018-08-21 (×3): qty 1

## 2018-08-21 MED ORDER — INSULIN ASPART 100 UNIT/ML ~~LOC~~ SOLN: 0.0000 [IU] | Freq: Every day | SUBCUTANEOUS | Status: DC

## 2018-08-21 MED ORDER — LEVETIRACETAM 500 MG PO TABS
500.0000 mg | ORAL_TABLET | Freq: Two times a day (BID) | ORAL | Status: DC
  Administered 2018-08-21 – 2018-08-24 (×6): 500 mg via ORAL
  Filled 2018-08-21 (×6): qty 1

## 2018-08-21 MED ORDER — GABAPENTIN 300 MG PO CAPS
600.0000 mg | ORAL_CAPSULE | Freq: Three times a day (TID) | ORAL | Status: DC
  Administered 2018-08-21 – 2018-08-24 (×8): 600 mg via ORAL
  Filled 2018-08-21 (×9): qty 2

## 2018-08-21 MED ORDER — BISACODYL 5 MG PO TBEC: 5.0000 mg | DELAYED_RELEASE_TABLET | Freq: Every day | ORAL | Status: DC | PRN

## 2018-08-21 MED ORDER — PANTOPRAZOLE SODIUM 40 MG PO TBEC
40.0000 mg | DELAYED_RELEASE_TABLET | Freq: Every day | ORAL | Status: DC
  Administered 2018-08-22 – 2018-08-24 (×3): 40 mg via ORAL
  Filled 2018-08-21 (×3): qty 1

## 2018-08-21 MED ORDER — HYDROCODONE-ACETAMINOPHEN 5-325 MG PO TABS
1.0000 | ORAL_TABLET | ORAL | Status: DC | PRN
  Administered 2018-08-21: 2 via ORAL
  Administered 2018-08-22: 1 via ORAL
  Administered 2018-08-22 – 2018-08-23 (×3): 2 via ORAL
  Administered 2018-08-23: 1 via ORAL
  Filled 2018-08-21: qty 1
  Filled 2018-08-21: qty 2
  Filled 2018-08-21 (×3): qty 1
  Filled 2018-08-21: qty 2
  Filled 2018-08-21: qty 1
  Filled 2018-08-21: qty 2

## 2018-08-21 MED ORDER — FENTANYL CITRATE (PF) 100 MCG/2ML IJ SOLN
50.0000 ug | Freq: Once | INTRAMUSCULAR | Status: AC
  Administered 2018-08-21: 50 ug via INTRAVENOUS
  Filled 2018-08-21: qty 2

## 2018-08-21 MED ORDER — CIPROFLOXACIN IN D5W 400 MG/200ML IV SOLN
400.0000 mg | Freq: Two times a day (BID) | INTRAVENOUS | Status: DC
  Administered 2018-08-22 – 2018-08-24 (×5): 400 mg via INTRAVENOUS
  Filled 2018-08-21 (×6): qty 200

## 2018-08-21 MED ORDER — ACETAMINOPHEN 325 MG PO TABS: 650.0000 mg | ORAL_TABLET | Freq: Four times a day (QID) | ORAL | Status: DC | PRN

## 2018-08-21 MED ORDER — CARVEDILOL 12.5 MG PO TABS: 12.5000 mg | ORAL_TABLET | Freq: Two times a day (BID) | ORAL | Status: DC

## 2018-08-21 MED ORDER — ONDANSETRON HCL 4 MG/2ML IJ SOLN
4.0000 mg | Freq: Four times a day (QID) | INTRAMUSCULAR | Status: DC | PRN
  Administered 2018-08-21 – 2018-08-24 (×2): 4 mg via INTRAVENOUS
  Filled 2018-08-21: qty 2

## 2018-08-21 MED ORDER — MYCOPHENOLATE SODIUM 180 MG PO TBEC
360.0000 mg | DELAYED_RELEASE_TABLET | Freq: Three times a day (TID) | ORAL | Status: DC
  Administered 2018-08-22 – 2018-08-24 (×8): 360 mg via ORAL
  Filled 2018-08-21 (×11): qty 2

## 2018-08-21 MED ORDER — MORPHINE SULFATE (PF) 4 MG/ML IV SOLN
4.0000 mg | INTRAVENOUS | Status: DC | PRN
  Administered 2018-08-21: 4 mg via INTRAVENOUS

## 2018-08-21 MED ORDER — SODIUM CHLORIDE 0.9 % IV SOLN
INTRAVENOUS | Status: DC
  Administered 2018-08-21 – 2018-08-22 (×2): via INTRAVENOUS

## 2018-08-21 MED ORDER — SODIUM CHLORIDE 0.9% FLUSH: 3.0000 mL | Freq: Once | INTRAVENOUS | Status: DC

## 2018-08-21 MED ORDER — ALBUTEROL SULFATE (2.5 MG/3ML) 0.083% IN NEBU: 2.5000 mg | INHALATION_SOLUTION | RESPIRATORY_TRACT | Status: DC | PRN

## 2018-08-21 MED ORDER — SODIUM BICARBONATE 650 MG PO TABS
1300.0000 mg | ORAL_TABLET | Freq: Three times a day (TID) | ORAL | Status: DC
  Administered 2018-08-21 – 2018-08-24 (×8): 1300 mg via ORAL
  Filled 2018-08-21 (×8): qty 2

## 2018-08-21 MED ORDER — MAGNESIUM OXIDE 400 (241.3 MG) MG PO TABS
400.0000 mg | ORAL_TABLET | Freq: Every day | ORAL | Status: DC
  Administered 2018-08-22 – 2018-08-24 (×3): 400 mg via ORAL
  Filled 2018-08-21 (×3): qty 1

## 2018-08-21 MED ORDER — ONDANSETRON HCL 4 MG/2ML IJ SOLN
INTRAMUSCULAR | Status: AC
  Administered 2018-08-21: 4 mg via INTRAVENOUS
  Filled 2018-08-21: qty 2

## 2018-08-21 MED ORDER — METRONIDAZOLE IN NACL 5-0.79 MG/ML-% IV SOLN
500.0000 mg | Freq: Three times a day (TID) | INTRAVENOUS | Status: DC
  Administered 2018-08-22 – 2018-08-24 (×7): 500 mg via INTRAVENOUS
  Filled 2018-08-21 (×9): qty 100

## 2018-08-21 MED ORDER — FLUTICASONE PROPIONATE 50 MCG/ACT NA SUSP
2.0000 | Freq: Every day | NASAL | Status: DC
  Administered 2018-08-22 – 2018-08-24 (×3): 2 via NASAL
  Filled 2018-08-21 (×2): qty 16

## 2018-08-21 MED ORDER — METRONIDAZOLE IN NACL 5-0.79 MG/ML-% IV SOLN
500.0000 mg | Freq: Once | INTRAVENOUS | Status: AC
  Administered 2018-08-21: 500 mg via INTRAVENOUS
  Filled 2018-08-21: qty 100

## 2018-08-21 MED ORDER — SENNOSIDES-DOCUSATE SODIUM 8.6-50 MG PO TABS: 1.0000 | ORAL_TABLET | Freq: Every evening | ORAL | Status: DC | PRN

## 2018-08-21 MED ORDER — VITAMIN D 25 MCG (1000 UNIT) PO TABS
1000.0000 [IU] | ORAL_TABLET | Freq: Every day | ORAL | Status: DC
  Administered 2018-08-22 – 2018-08-24 (×3): 1000 [IU] via ORAL
  Filled 2018-08-21 (×3): qty 1

## 2018-08-21 MED ORDER — TAMOXIFEN CITRATE 10 MG PO TABS
20.0000 mg | ORAL_TABLET | Freq: Every day | ORAL | Status: DC
  Administered 2018-08-22 – 2018-08-24 (×3): 20 mg via ORAL
  Filled 2018-08-21 (×3): qty 2

## 2018-08-21 MED ORDER — SODIUM CHLORIDE 0.9 % IV SOLN: 2.0000 g | Freq: Once | INTRAVENOUS | Status: DC

## 2018-08-21 MED ORDER — ATORVASTATIN CALCIUM 20 MG PO TABS
40.0000 mg | ORAL_TABLET | Freq: Every day | ORAL | Status: DC
  Administered 2018-08-21 – 2018-08-23 (×3): 40 mg via ORAL
  Filled 2018-08-21 (×3): qty 2

## 2018-08-21 MED ORDER — SODIUM CHLORIDE 0.9 % IV BOLUS
500.0000 mL | Freq: Once | INTRAVENOUS | Status: AC
  Administered 2018-08-21: 500 mL via INTRAVENOUS

## 2018-08-21 MED ORDER — MORPHINE SULFATE (PF) 4 MG/ML IV SOLN
INTRAVENOUS | Status: AC
  Administered 2018-08-21: 4 mg via INTRAVENOUS
  Filled 2018-08-21: qty 1

## 2018-08-21 MED ORDER — INSULIN ASPART 100 UNIT/ML ~~LOC~~ SOLN
0.0000 [IU] | Freq: Three times a day (TID) | SUBCUTANEOUS | Status: DC
  Administered 2018-08-22: 1 [IU] via SUBCUTANEOUS
  Administered 2018-08-22 – 2018-08-23 (×3): 2 [IU] via SUBCUTANEOUS
  Administered 2018-08-23: 1 [IU] via SUBCUTANEOUS
  Administered 2018-08-23: 2 [IU] via SUBCUTANEOUS
  Administered 2018-08-24: 1 [IU] via SUBCUTANEOUS
  Filled 2018-08-21 (×7): qty 1

## 2018-08-21 MED ORDER — TACROLIMUS 1 MG PO CAPS
4.0000 mg | ORAL_CAPSULE | Freq: Two times a day (BID) | ORAL | Status: DC
  Administered 2018-08-21 – 2018-08-24 (×6): 4 mg via ORAL
  Filled 2018-08-21 (×7): qty 4

## 2018-08-21 MED ORDER — SODIUM CHLORIDE 0.9 % IV SOLN
2.0000 g | Freq: Once | INTRAVENOUS | Status: AC
  Administered 2018-08-21: 2 g via INTRAVENOUS
  Filled 2018-08-21: qty 2

## 2018-08-21 MED ORDER — NITROGLYCERIN 0.4 MG SL SUBL: 0.4000 mg | SUBLINGUAL_TABLET | SUBLINGUAL | Status: DC | PRN

## 2018-08-21 MED ORDER — ONDANSETRON HCL 4 MG PO TABS: 4.0000 mg | ORAL_TABLET | Freq: Four times a day (QID) | ORAL | Status: DC | PRN

## 2018-08-21 NOTE — ED Notes (Addendum)
Report finished att, Alma Friendly NT called for transport

## 2018-08-21 NOTE — Consult Note (Signed)
Pharmacy Antibiotic Note  [name] is a 53 y.o. female admitted on 08/21/2018 with intra-adominal infection.  Pharmacy has been consulted for ciprofloxacin dosing.  Plan: Ciprofloxacin 400 mg IV every 12 hours.  Will initiate 12 hours after cefepime administration based on renal function.  Height: 5\' 5"  (165.1 cm) Weight: 199 lb (90.3 kg) IBW/kg (Calculated) : 57  Temp (24hrs), Avg:98.4 F (36.9 C), Min:98.4 F (36.9 C), Max:98.4 F (36.9 C)  Recent Labs  Lab 08/16/18 1510 08/21/18 1636  WBC 5.7 16.4*  TEST WILL BE CREDITED  CREATININE 1.90* 2.20*    Estimated Creatinine Clearance: 33.6 mL/min (A) (by C-G formula based on SCr of 2.2 mg/dL (H)).    Allergies  Allergen Reactions  . Heparin     Heparin induced THrombocytopenia "i bled for a long time and had to have 2 blood transfusions"   . Penicillin G   . Ibuprofen     Caused kidney damage Can't take due to kidneys  . Other     Other reaction(s): Other (See Comments) Causing HITT  . Penicillins     Childhood reaction Patient is unsure if she's allergic, was told she had a reaction as a child but thinks she's taking a "cillin" while sick without having a reaction.  Pt states she has taken amoxicillin in the past and did not have problem    Antimicrobials this admission: Cefepime and Metronidazole x 1 dose each in ED Cipro 1/20 >>   Dose adjustments this admission:   Microbiology results: 1/19 BCx: pending    Thank you for allowing pharmacy to be a part of this patient's care.  Forrest Moron, PharmD Clinical Pharmacist 08/21/2018 8:42 PM

## 2018-08-21 NOTE — ED Notes (Signed)
Floor called for pt arriving, [place], [place] transporting

## 2018-08-21 NOTE — ED Triage Notes (Signed)
Pt c/o of central CP and LUQ abd pain. States pain began "2 days after I got out of the hospital," thinks she left hospital on Tuesday. Was admitted for "possibly heart attack." prescribed nitro but hasn't taken it. States most of pain is LUQ and not in chest. States diarrhea x 2 episodes. Denies vomiting or fever. Still has gallbladder.   A&O, speaking in complete sentences. Mask in place.

## 2018-08-21 NOTE — H&P (Signed)
[facility] [place] NAME: [name]    MR#:  [id number]  DATE OF BIRTH:  1966/04/20  DATE OF ADMISSION:  08/21/2018  PRIMARY CARE PHYSICIAN: Valerie Roys, DO   REQUESTING/REFERRING PHYSICIAN: Dr. Burlene Arnt.  CHIEF COMPLAINT:   Chief Complaint  Patient presents with  . Chest Pain  . Abdominal Pain   Abdominal pain for 2 days. HISTORY OF PRESENT ILLNESS:  [name]  is a 53 y.o. female with a known history of multiple medical problems as below.  The patient complains of abdominal pain on the lower left side for the past 2 days, which is intermittent, sharp, 8 out of 10 without radiation.  She denies any fever or chills, no nausea, vomiting or diarrhea, no melena or bloody stool.  She is found leukocytosis.  CT abdominal and pelvis report diverticulitis.  ED physician request admission.  PAST MEDICAL HISTORY:   Past Medical History:  Diagnosis Date  . Arrhythmia   . Cerebrovascular accident [facility]) 2010   without any focal neurological deficits.   . Clotted renal dialysis AV graft ([place])   . 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 Diabetes mellitus without complication ([place])   . Ductal carcinoma in situ (DCIS) of left breast   . End stage renal disease ([place])    a. previously on dialysis on Tues, Thurs, & 11-Nov-2022 x 7 yrs; b. s/p deceased donor transplant 04-11-2014   . GERD (gastroesophageal reflux disease)   . History of methicillin resistant Staphylococcus aureus infection   . History of stress test    a. 04/2013: normal, EF 65%  . HIT (heparin-induced thrombocytopenia) ([place])   . Hypertension   . Mitral regurgitation    a. 01/2014: EF >55%, LVH, mild MR, dilated LA  . Renal transplant, status post April 11, 2014  . RLS (restless legs syndrome)   . Seizure disorder ([place])   . Seizures ([place])   . Sepsis ([place]) 08/09/2016  . 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 INSERTION OF DIALYSIS CATHETER     x 2  . KIDNEY TRANSPLANT  04/11/14   right side.  [facility] MASTECTOMY PARTIAL / LUMPECTOMY Left 09/24/2015  . MASTECTOMY PARTIAL / LUMPECTOMY Left 10/25/2015    SOCIAL HISTORY:   Social History   Tobacco Use  . Smoking status: Former Smoker    Last attempt to quit: 11/25/2008    Years since quitting: 9.7  . Smokeless tobacco: Never Used  Substance Use Topics  . Alcohol use: Yes    Alcohol/week: 2.0 standard drinks    Types: 2 Glasses of wine per week    FAMILY HISTORY:   Family History  Problem Relation Age of Onset  . Heart attack Mother   . Hypertension Mother   . Hyperlipidemia Mother   . Cancer Mother        breast  . Heart attack Father   . Hypertension Father   . Hyperlipidemia Father   . Kidney failure Sister        half sister  . Diabetes Maternal Grandmother   . Hearing loss Maternal Grandmother   . Heart failure Maternal Grandmother   . Diabetes Maternal Grandfather   . Stroke Maternal Grandfather   . Cancer Paternal Grandmother        breast  . Stroke Paternal Grandfather   . Aneurysm Brother  Brain  . Other Sister        Nerve problems    DRUG ALLERGIES:   Allergies  Allergen Reactions  . Heparin     Heparin induced THrombocytopenia "i bled for a long time and had to have 2 blood transfusions"   . Penicillin G   . Ibuprofen     Caused kidney damage Can't take due to kidneys  . Other     Other reaction(s): Other (See Comments) Causing HITT  . Penicillins     Childhood reaction Patient is unsure if she's allergic, was told she had a reaction as a child but thinks she's taking a "cillin" while sick without having a reaction.  Pt states she has taken amoxicillin in the past and did not have problem    REVIEW OF SYSTEMS:   Review of Systems  Constitutional: Negative for chills, fever and malaise/fatigue.  HENT: Negative for sore throat.   Eyes: Negative for  blurred vision and double vision.  Respiratory: Negative for cough, hemoptysis, shortness of breath, wheezing and stridor.   Cardiovascular: Negative for chest pain, palpitations, orthopnea and leg swelling.  Gastrointestinal: Positive for abdominal pain. Negative for blood in stool, diarrhea, melena, nausea and vomiting.  Genitourinary: Negative for dysuria, flank pain and hematuria.  Musculoskeletal: Negative for back pain and joint pain.  Skin: Negative for rash.  Neurological: Negative for dizziness, sensory change, focal weakness, seizures, loss of consciousness, weakness and headaches.  Endo/Heme/Allergies: Negative for polydipsia.  Psychiatric/Behavioral: Negative for depression. The patient is not nervous/anxious.     MEDICATIONS AT HOME:   Prior to Admission medications   Medication Sig Start Date End Date Taking? Authorizing Provider  aspirin EC 81 MG tablet Take 81 mg by mouth daily.   Yes [provider]  atorvastatin (LIPITOR) 40 MG tablet Take 1 tablet (40 mg total) by mouth daily. 08/16/18  Yes Johnson, Megan P, DO  carvedilol (COREG) 12.5 MG tablet Take 12.5 mg by mouth 2 (two) times daily with a meal.  08/06/16  Yes [provider]  cholecalciferol (VITAMIN D) 25 MCG (1000 UT) tablet Take 1,000 Units by mouth daily.   Yes [provider]  fluticasone (FLONASE) 50 MCG/ACT nasal spray Two sprays each nare twice a day. Patient taking differently: Place 2 sprays into both nostrils daily. . 12/06/17  Yes Johnson, Megan P, DO  gabapentin (NEURONTIN) 300 MG capsule Take 600 mg by mouth 3 (three) times daily.    Yes [provider]  levETIRAcetam (KEPPRA) 500 MG tablet Take 500 mg by mouth 2 (two) times daily.  11/09/16  Yes [provider]  magnesium oxide (MAG-OX) 400 MG tablet Take 400 mg by mouth daily.    Yes [provider]  metFORMIN (GLUCOPHAGE-XR) 500 MG 24 hr tablet Take 1 tablet (500 mg total) by mouth daily with breakfast.  08/16/18  Yes Johnson, Megan P, DO  mycophenolate (MYFORTIC) 180 MG EC tablet Take 360 mg by mouth 3 (three) times daily.  09/08/17 09/08/18 Yes [provider]  nitroGLYCERIN (NITROSTAT) 0.4 MG SL tablet Place 1 tablet (0.4 mg total) under the tongue every 5 (five) minutes x 3 doses as needed for chest pain. 08/09/18  Yes Demetrios Loll, MD  omeprazole (PRILOSEC) 20 MG capsule Take 1 capsule (20 mg total) by mouth daily as needed (GERD or heartburn). 08/16/18  Yes Johnson, Megan P, DO  sodium bicarbonate 650 MG tablet Take 2 tablets (1300 mg) three times a day. 04/11/14  Yes [provider]  tacrolimus (PROGRAF) 1 MG capsule Take 4 mg by mouth 2 (two) times daily.  06/06/14  Yes [provider]  tamoxifen (NOLVADEX) 20 MG tablet Take 20 mg by mouth daily. 12/07/16  Yes [provider]  valACYclovir (VALTREX) 1000 MG tablet Take 1 tablet (1,000 mg total) by mouth 2 (two) times daily. 06/27/18  Yes Johnson, Megan P, DO  cetirizine (ZYRTEC) 10 MG tablet Take 1 tablet (10 mg total) by mouth daily as needed for allergies or rhinitis. Patient not taking: Reported on 08/21/2018 08/16/18   Park Liter P, DO      VITAL SIGNS:  Blood pressure 126/87, pulse 87, temperature 98.4 F (36.9 C), temperature source Oral, resp. rate (!) 24, height 5\' 5"  (1.651 m), weight 90.3 kg, last menstrual period 03/03/2016, SpO2 99 %.  PHYSICAL EXAMINATION:  Physical Exam  GENERAL:  53 y.o.-year-old patient lying in the bed with no acute distress.  EYES: Pupils equal, round, reactive to light and accommodation. No scleral icterus. Extraocular muscles intact.  HEENT: Head atraumatic, normocephalic. Oropharynx and nasopharynx clear.  NECK:  Supple, no jugular venous distention. No thyroid enlargement, no tenderness.  LUNGS: Normal breath sounds bilaterally, no wheezing, rales,rhonchi or crepitation. No use of accessory muscles of respiration.  CARDIOVASCULAR: S1, S2 normal. No murmurs, rubs, or  gallops.  ABDOMEN: Soft, tenderness on the left lower quadrant, nondistended. Bowel sounds present. No organomegaly or mass.  EXTREMITIES: No pedal edema, cyanosis, or clubbing.  NEUROLOGIC: Cranial nerves II through XII are intact. Muscle strength 5/5 in all extremities. Sensation intact. Gait not checked.  PSYCHIATRIC: The patient is alert and oriented x 3.  SKIN: No obvious rash, lesion, or ulcer.   LABORATORY PANEL:   CBC Recent Labs  Lab 08/21/18 1636  WBC 16.4*  TEST WILL BE CREDITED  HGB 11.8*  TEST WILL BE CREDITED  HCT 37.0  TEST WILL BE CREDITED  PLT 315  TEST WILL BE CREDITED   ------------------------------------------------------------------------------------------------------------------  Chemistries  Recent Labs  Lab 08/21/18 1636  NA 132*  K 5.0  CL 105  CO2 20*  GLUCOSE 133*  BUN 30*  CREATININE 2.20*  CALCIUM 10.2  AST 14*  ALT 12  ALKPHOS 43  BILITOT 0.5   ------------------------------------------------------------------------------------------------------------------  Cardiac Enzymes Recent Labs  Lab 08/21/18 1636  TROPONINI <0.03   ------------------------------------------------------------------------------------------------------------------  RADIOLOGY:  Ct Abdomen Pelvis Wo Contrast  Result Date: 08/21/2018 CLINICAL DATA:  Epigastric pain.  Patient has renal transplant. EXAM: CT ABDOMEN AND PELVIS WITHOUT CONTRAST TECHNIQUE: Multidetector CT imaging of the abdomen and pelvis was performed following the standard protocol without IV contrast. COMPARISON:  February 16, 2011 FINDINGS: Lower chest: Consolidation of left lower lobe is identified. The heart size is mildly enlarged. Hepatobiliary: No focal liver abnormality is seen. No gallstones, gallbladder wall thickening, or biliary dilatation. Pancreas: Unremarkable. No pancreatic ductal dilatation or surrounding inflammatory changes. Spleen: Normal in size without focal abnormality.  Adrenals/Urinary Tract: The bilateral adrenal glands are normal. Bilateral native kidneys are atrophic. Nonobstructing stones are noted in both native kidneys. A right lower quadrant kidney transplant is identified. There is mild fullness of renal calices of the transplanted kidney. No definite discrete obstructing stone is noted. There is diffuse anterior bladder wall thickening Stomach/Bowel: There is inflammation surrounding the descending colon. Colon is otherwise unremarkable. There is diverticulosis of colon. There is no small bowel obstruction. The appendix is normal. The stomach is normal. Vascular/Lymphatic: Aortic atherosclerosis. No enlarged abdominal or pelvic lymph nodes. Reproductive: Uterus  and bilateral adnexa are unremarkable. Other: None. Musculoskeletal: No acute abnormality is noted. IMPRESSION: Inflammation surrounding the descending colon consistent with acute diverticulitis. No focal discrete abscess is identified. Consolidation of left lower lobe. Although the finding may be in part due to atelectasis, focal pneumonia is not excluded. Mild fullness of the renal calices of the transplanted kidney without evidence of focal discrete obstructing stone noted. Electronically Signed   By: Abelardo Diesel M.D.   On: 08/21/2018 18:23      IMPRESSION AND PLAN:   Acute diverticulitis with leukocytosis. The patient will be admitted to medical floor. The patient is treated with cefepime and Flagyl in the ED. Continue Flagyl, start Cipro pharmacy to dose.  Follow-up CBC.  Hyponatremia.  Gentle normal saline IV and follow-up BMP.  ESRD s/p kidney transplant.  Creatinine is stable, continue home immune suppression medication.   Diabetes.  Start sliding scale.  Hold metformin.  Hypertension.  Continue Coreg.  All the records are reviewed and case discussed with ED provider. Management plans discussed with the patient, her sister and they are in agreement.  CODE STATUS: Full code  TOTAL  TIME TAKING CARE OF THIS PATIENT: 36 minutes.    Demetrios Loll M.D on 08/21/2018 at 8:13 PM  Between 7am to 6pm - Pager - [phone]  After 6pm go to [email or web address] - Proofreader  [facility]  Office  [phone]  CC: Primary care physician; Valerie Roys, DO   Note: This dictation was prepared with Dragon dictation along with smaller phrase technology. Any transcriptional errors that result from this process are unin

## 2018-08-21 NOTE — ED Notes (Signed)
Pt c/o minimal amount of left sided chest pain without radiation - reports shortness of breath at times - c/o LUQ pain that is worse with any intake - describes pain as sharp and constant in nature - denies N/V but reports diarrhea x2 today - tender to palpation in LUQ

## 2018-08-21 NOTE — ED Notes (Signed)
ED TO INPATIENT HANDOFF REPORT  Name/Age/Gender [name] 53 y.o. female  Code Status Code Status History    Date Active Date Inactive Code Status Order ID Comments User Context   08/08/2018 1753 08/09/2018 2107 Full Code [id number]  Epifanio Lesches, MD ED   08/09/2016 1314 08/11/2016 1720 Full Code [id number]  Bettey Costa, MD Inpatient      Home/SNF/Other Home  Chief Complaint Chest Pain; Abdominal Pain  Level of Care/Admitting Diagnosis ED Disposition    ED Disposition Condition [place]: [place] [[id number]]  Level of Care: Med-Surg [16]  Diagnosis: Acute diverticulitis [[id number]]  Admitting Physician: Demetrios Loll [[id number]]  Attending Physician: Demetrios Loll [[id number]]  Estimated length of stay: past midnight tomorrow  Certification:: I certify this patient will need inpatient services for at least 2 midnights  PT Class (Do Not Modify): Inpatient [101]  PT Acc Code (Do Not Modify): Private [1]       Medical History Past Medical History:  Diagnosis Date  . Arrhythmia   . Cerebrovascular accident [facility]) 2010   without any focal neurological deficits.   . Clotted renal dialysis AV graft ([place])   . Dependence on hemodialysis ([place]) 11/17/2013  . Diabetes mellitus without complication ([place])   . Ductal carcinoma in situ (DCIS) of left breast   . End stage renal disease ([place])    a. previously on dialysis on Tues, Thurs, & 10/28/22 x 7 yrs; b. s/p deceased donor transplant 2014/03/28   . GERD (gastroesophageal reflux disease)   . History of methicillin resistant Staphylococcus aureus infection   . History of stress test    a. 04/2013: normal, EF 65%  . HIT (heparin-induced thrombocytopenia) ([place])   . Hypertension   . Mitral regurgitation    a. 01/2014: EF >55%, LVH, mild MR, dilated LA  . Renal transplant, status post 28-Mar-2014  . RLS (restless legs syndrome)   . Seizure disorder ([place])   . Seizures ([place])   . Sepsis ([place]) 08/09/2016  .  Stroke due to intracerebral hemorrhage ([place]) 11/17/2013    Allergies Allergies  Allergen Reactions  . Heparin     Heparin induced THrombocytopenia "i bled for a long time and had to have 2 blood transfusions"   . Penicillin G   . Ibuprofen     Caused kidney damage Can't take due to kidneys  . Other     Other reaction(s): Other (See Comments) Causing HITT  . Penicillins     Childhood reaction Patient is unsure if she's allergic, was told she had a reaction as a child but thinks she's taking a "cillin" while sick without having a reaction.  Pt states she has taken amoxicillin in the past and did not have problem    IV Location/Drains/Wounds Patient Lines/Drains/Airways Status   Active Line/Drains/Airways    Name:   Placement date:   Placement time:   Site:   Days:   Peripheral IV 08/21/18 Left Forearm   08/21/18    1635    Forearm   less than 1   Peripheral IV 08/21/18 Left Antecubital   08/21/18    2005    Antecubital   less than 1   Fistula / Graft Right Upper arm Arteriovenous vein graft   08/03/13    2000    Upper arm   1844          Labs/Imaging Results for orders placed or performed during the hospital encounter of 08/21/18 (from the  past 48 hour(s))  Lipase, blood     Status: None   Collection Time: 08/21/18  4:36 PM  Result Value Ref Range   Lipase 25 11 - 51 U/L    Comment: Performed at [facility], [address] [place], [place] [address]  Comprehensive metabolic panel     Status: Abnormal   Collection Time: 08/21/18  4:36 PM  Result Value Ref Range   Sodium 132 (L) 135 - 145 mmol/L   Potassium 5.0 3.5 - 5.1 mmol/L   Chloride 105 98 - 111 mmol/L   CO2 20 (L) 22 - 32 mmol/L   Glucose, Bld 133 (H) 70 - 99 mg/dL   BUN 30 (H) 6 - 20 mg/dL   Creatinine, Ser 2.20 (H) 0.44 - 1.00 mg/dL   Calcium 10.2 8.9 - 10.3 mg/dL   Total Protein 8.2 (H) 6.5 - 8.1 g/dL   Albumin 4.2 3.5 - 5.0 g/dL   AST 14 (L) 15 - 41 U/L   ALT 12 0 - 44 U/L   Alkaline  Phosphatase 43 38 - 126 U/L   Total Bilirubin 0.5 0.3 - 1.2 mg/dL   GFR calc non Af Amer 25 (L) >60 mL/min   GFR calc Af Amer 29 (L) >60 mL/min   Anion gap 7 5 - 15    Comment: Performed at [facility], [address] [place], [place] [address]  CBC     Status: None   Collection Time: 08/21/18  4:36 PM  Result Value Ref Range   WBC TEST WILL BE CREDITED 4.0 - 10.5 K/uL    Comment: ADD ON DIFFERENTIAL CORRECTED ON 01/19 AT 1924: PREVIOUSLY REPORTED AS 16.6    RBC TEST WILL BE CREDITED 3.87 - 5.11 MIL/uL    Comment: CORRECTED ON 01/19 AT 1924: PREVIOUSLY REPORTED AS 3.85   Hemoglobin TEST WILL BE CREDITED 12.0 - 15.0 g/dL    Comment: CORRECTED ON 01/19 AT 1924: PREVIOUSLY REPORTED AS 11.7   HCT TEST WILL BE CREDITED 36.0 - 46.0 %    Comment: CORRECTED ON 01/19 AT 1924: PREVIOUSLY REPORTED AS 37.3   MCV TEST WILL BE CREDITED 80.0 - 100.0 fL    Comment: CORRECTED ON 01/19 AT 1924: PREVIOUSLY REPORTED AS 96.9   MCH TEST WILL BE CREDITED 26.0 - 34.0 pg    Comment: CORRECTED ON 01/19 AT 1924: PREVIOUSLY REPORTED AS 30.4   MCHC TEST WILL BE CREDITED 30.0 - 36.0 g/dL    Comment: CORRECTED ON 01/19 AT 1924: PREVIOUSLY REPORTED AS 31.4   RDW TEST WILL BE CREDITED 11.5 - 15.5 %    Comment: CORRECTED ON 01/19 AT 1924: PREVIOUSLY REPORTED AS 12.3   Platelets TEST WILL BE CREDITED 150 - 400 K/uL    Comment: Performed at [facility], [address] [place], [place] [address] CORRECTED ON 01/19 AT 1924: PREVIOUSLY REPORTED AS 297   Troponin I - Add-On to previous collection     Status: None   Collection Time: 08/21/18  4:36 PM  Result Value Ref Range   Troponin I <0.03 <0.03 ng/mL    Comment: Performed at [facility], [address] [place], [place] [address]  CBC with Differential/Platelet     Status: Abnormal   Collection Time: 08/21/18  4:36 PM  Result Value Ref Range   WBC 16.4 (H) 4.0 - 10.5 K/uL   RBC 3.83 (L) 3.87 - 5.11 MIL/uL   Hemoglobin 11.8 (L)  12.0 - 15.0 g/dL   HCT  37.0 36.0 - 46.0 %   MCV 96.6 80.0 - 100.0 fL   MCH 30.8 26.0 - 34.0 pg   MCHC 31.9 30.0 - 36.0 g/dL   RDW 12.5 11.5 - 15.5 %   Platelets 315 150 - 400 K/uL   nRBC 0.0 0.0 - 0.2 %   Neutrophils Relative % 80 %   Neutro Abs 13.2 (H) 1.7 - 7.7 K/uL   Lymphocytes Relative 10 %   Lymphs Abs 1.6 0.7 - 4.0 K/uL   Monocytes Relative 9 %   Monocytes Absolute 1.4 (H) 0.1 - 1.0 K/uL   Eosinophils Relative 0 %   Eosinophils Absolute 0.1 0.0 - 0.5 K/uL   Basophils Relative 0 %   Basophils Absolute 0.1 0.0 - 0.1 K/uL   Immature Granulocytes 1 %   Abs Immature Granulocytes 0.09 (H) 0.00 - 0.07 K/uL    Comment: Performed at [facility], [address], [place], [place] [address]   Ct Abdomen Pelvis Wo Contrast  Result Date: 08/21/2018 CLINICAL DATA:  Epigastric pain.  Patient has renal transplant. EXAM: CT ABDOMEN AND PELVIS WITHOUT CONTRAST TECHNIQUE: Multidetector CT imaging of the abdomen and pelvis was performed following the standard protocol without IV contrast. COMPARISON:  February 16, 2011 FINDINGS: Lower chest: Consolidation of left lower lobe is identified. The heart size is mildly enlarged. Hepatobiliary: No focal liver abnormality is seen. No gallstones, gallbladder wall thickening, or biliary dilatation. Pancreas: Unremarkable. No pancreatic ductal dilatation or surrounding inflammatory changes. Spleen: Normal in size without focal abnormality. Adrenals/Urinary Tract: The bilateral adrenal glands are normal. Bilateral native kidneys are atrophic. Nonobstructing stones are noted in both native kidneys. A right lower quadrant kidney transplant is identified. There is mild fullness of renal calices of the transplanted kidney. No definite discrete obstructing stone is noted. There is diffuse anterior bladder wall thickening Stomach/Bowel: There is inflammation surrounding the descending colon. Colon is otherwise unremarkable. There is diverticulosis of colon. There is  no small bowel obstruction. The appendix is normal. The stomach is normal. Vascular/Lymphatic: Aortic atherosclerosis. No enlarged abdominal or pelvic lymph nodes. Reproductive: Uterus and bilateral adnexa are unremarkable. Other: None. Musculoskeletal: No acute abnormality is noted. IMPRESSION: Inflammation surrounding the descending colon consistent with acute diverticulitis. No focal discrete abscess is identified. Consolidation of left lower lobe. Although the finding may be in part due to atelectasis, focal pneumonia is not excluded. Mild fullness of the renal calices of the transplanted kidney without evidence of focal discrete obstructing stone noted. Electronically Signed   By: Abelardo Diesel M.D.   On: 08/21/2018 18:23    Pending Labs Unresulted Labs (From admission, onward)    Start     Ordered   08/21/18 1916  Blood culture (routine x 2)  BLOOD CULTURE X 2,   STAT     08/21/18 1917   08/21/18 1614  Urinalysis, Complete w Microscopic  ONCE - STAT,   STAT     08/21/18 1614   Signed and Held  Basic metabolic panel  Tomorrow morning,   R     Signed and Held   Signed and Held  CBC  Tomorrow morning,   R     Signed and Held          Vitals/Pain Today's Vitals   08/21/18 1920 08/21/18 1935 08/21/18 2018 08/21/18 2019  BP:   (!) 141/96   Pulse:   90   Resp:   20   Temp:      TempSrc:  SpO2:   99%   Weight:      Height:      PainSc: 0-No pain 0-No pain 10-Worst pain ever 10-Worst pain ever    Isolation Precautions No active isolations  Medications Medications  sodium chloride flush (NS) 0.9 % injection 3 mL (has no administration in time range)  ceFEPIme (MAXIPIME) 2 g in sodium chloride 0.9 % 100 mL IVPB (0 g Intravenous Stopped 08/21/18 2044)    And  metroNIDAZOLE (FLAGYL) IVPB 500 mg (500 mg Intravenous New Bag/Given 08/21/18 2013)  ondansetron (ZOFRAN) tablet 4 mg ( Oral See Alternative 08/21/18 2048)    Or  ondansetron (ZOFRAN) injection 4 mg (4 mg Intravenous Given  08/21/18 2048)  morphine 4 MG/ML injection 4 mg (4 mg Intravenous Given 08/21/18 2049)  ciprofloxacin (CIPRO) IVPB 400 mg (has no administration in time range)  sodium chloride 0.9 % bolus 500 mL (0 mLs Intravenous Stopped 08/21/18 1817)  fentaNYL (SUBLIMAZE) injection 50 mcg (50 mcg Intravenous Given 08/21/18 1706)    Mobility walks

## 2018-08-21 NOTE — ED Provider Notes (Addendum)
[facility] Emergency Department Provider Note  ____________________________________________   I have reviewed the triage vital signs and the nursing notes. Where available I have reviewed prior notes and, if possible and indicated, outside hospital notes.    HISTORY  Chief Complaint Chest Pain and Abdominal Pain    HPI [name] is a 53 y.o. female  With a history of transplant 2015, on Prograf, was recently admitted for atypical chest pain, patient states that she is not having chest pain, she states she began having epigastric and left upper quadrant abdominal pain a week ago which has been present ever since.  There is no chest pain there is no exertional symptoms.  She can exactly specify the area where it hurts.  The rest of the review of systems is largely negative.  She has no change in her stooling she is got no diarrhea although sometimes which takes metformin she does have loose stools she would not typify her bowel movements is any different from normal and she had a normal formed stool this morning with no melena.  She has had no vomiting.  Nothing makes it better nothing makes it worse there is no radiation there is no food link component to this.  She denies fever or chills she denies dysuria.  She has no flank pain.  She states that her graft is on the right and her pain is on the left.  She has not had pain like this before that she can recall.  She still has her gallbladder.   She denies taking anything to try to alleviate this discomfort prior to coming in.  She also states she is not really allergic to penicillin and has had amoxicillin in the past without complication.   Past Medical History:  Diagnosis Date  . Arrhythmia   . Cerebrovascular accident [facility]) 2010   without any focal neurological deficits.   . Clotted renal dialysis AV graft ([place])   . Dependence on hemodialysis ([place]) 11/17/2013  . Diabetes mellitus without complication ([place])    . Ductal carcinoma in situ (DCIS) of left breast   . End stage renal disease ([place])    a. previously on dialysis on Tues, Thurs, & November 21, 2022 x 7 yrs; b. s/p deceased donor transplant 2014/04/21   . GERD (gastroesophageal reflux disease)   . History of methicillin resistant Staphylococcus aureus infection   . History of stress test    a. 04/2013: normal, EF 65%  . HIT (heparin-induced thrombocytopenia) ([place])   . Hypertension   . Mitral regurgitation    a. 01/2014: EF >55%, LVH, mild MR, dilated LA  . Renal transplant, status post 04/21/14  . RLS (restless legs syndrome)   . Seizure disorder ([place])   . Seizures ([place])   . Sepsis ([place]) 08/09/2016  . 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 Compression of vein 08/16/2018  . Diastolic dysfunction without heart failure 08/16/2018  . Immunosuppressed status ([place]) 08/16/2018  . Atypical chest pain 08/09/2018  . Chronic bilateral low back pain with left-sided sciatica 09/27/2017  . Age-related nuclear cataract of both eyes 08/19/2017  . Hyperopia with astigmatism and presbyopia, bilateral 08/19/2017  . Hypertensive retinopathy of both eyes 08/19/2017  . DM (diabetes mellitus), type 2 with renal complications ([place]) 95/04/3266  . Vitamin D deficiency 10/02/2016  . 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 CKD (chronic kidney disease) stage 3, GFR 30-59 ml/min (  [place]) 08/17/2016  . Neuropathic pain of right thigh 08/17/2016  . History of CVA (cerebrovascular accident) 08/17/2016  . Ductal carcinoma in situ (DCIS) of left breast 09/12/2015  . Mitral regurgitation   . Renal transplant, status post 03/24/2014  . GERD (gastroesophageal reflux disease) 03/24/2014  . SVT (supraventricular tachycardia) ([place]) 11/17/2013  . Seizure disorder ([place]) 11/17/2013  . Benign hypertensive renal disease 11/17/2013    Past Surgical History:  Procedure Laterality Date  . INSERTION  OF DIALYSIS CATHETER     x 2  . KIDNEY TRANSPLANT  03/2014   right side.  [place] MASTECTOMY PARTIAL / LUMPECTOMY Left 09/24/2015  . MASTECTOMY PARTIAL / LUMPECTOMY Left 10/25/2015    Prior to Admission medications   Medication Sig Start Date End Date Taking? Authorizing Provider  aspirin EC 81 MG tablet Take 81 mg by mouth daily.    [provider]  atorvastatin (LIPITOR) 40 MG tablet Take 1 tablet (40 mg total) by mouth daily. 08/16/18   [place] Liter P, DO  carvedilol (COREG) 12.5 MG tablet Take 12.5 mg by mouth 2 (two) times daily with a meal.  08/06/16   [provider]  cetirizine (ZYRTEC) 10 MG tablet Take 1 tablet (10 mg total) by mouth daily as needed for allergies or rhinitis. 08/16/18   Johnson, Megan P, DO  cholecalciferol (VITAMIN D) 25 MCG (1000 UT) tablet Take 1,000 Units by mouth daily.    [provider]  fluticasone (FLONASE) 50 MCG/ACT nasal spray Two sprays each nare twice a day. Patient taking differently: Place 2 sprays into both nostrils daily. . 12/06/17   Johnson, Megan P, DO  gabapentin (NEURONTIN) 300 MG capsule Take 600 mg by mouth 3 (three) times daily.     [provider]  levETIRAcetam (KEPPRA) 500 MG tablet Take 500 mg by mouth 2 (two) times daily.  11/09/16   [provider]  magnesium oxide (MAG-OX) 400 MG tablet Take 400 mg by mouth daily.     [provider]  metFORMIN (GLUCOPHAGE-XR) 500 MG 24 hr tablet Take 1 tablet (500 mg total) by mouth daily with breakfast. 08/16/18   [place] Liter P, DO  mycophenolate (MYFORTIC) 180 MG EC tablet Take 360 mg by mouth 3 (three) times daily.  09/08/17 09/08/18  [provider]  nitroGLYCERIN (NITROSTAT) 0.4 MG SL tablet Place 1 tablet (0.4 mg total) under the tongue every 5 (five) minutes x 3 doses as needed for chest pain. 08/09/18   Demetrios Loll, MD  omeprazole (PRILOSEC) 20 MG capsule Take 1 capsule (20 mg total) by mouth daily as needed (GERD or heartburn). 08/16/18   Johnson,  Megan P, DO  sodium bicarbonate 650 MG tablet Take 2 tablets (1300 mg) three times a day. 04/11/14   [provider]  tacrolimus (PROGRAF) 1 MG capsule Take 4 mg by mouth 2 (two) times daily.  06/06/14   [provider]  tamoxifen (NOLVADEX) 20 MG tablet Take 20 mg by mouth daily. 12/07/16   [provider]  valACYclovir (VALTREX) 1000 MG tablet Take 1 tablet (1,000 mg total) by mouth 2 (two) times daily. 06/27/18   [place] Liter P, DO    Allergies Heparin; Penicillin g; Ibuprofen; Other; and Penicillins  Family History  Problem Relation Age of Onset  . Heart attack Mother   . Hypertension Mother   . Hyperlipidemia Mother   . Cancer Mother        breast  . Heart attack Father   . Hypertension Father   .  Hyperlipidemia Father   . Kidney failure Sister        half sister  . Diabetes Maternal Grandmother   . Hearing loss Maternal Grandmother   . Heart failure Maternal Grandmother   . Diabetes Maternal Grandfather   . Stroke Maternal Grandfather   . Cancer Paternal Grandmother        breast  . Stroke Paternal Grandfather   . Aneurysm Brother        Brain  . Other Sister        Nerve problems    Social History Social History   Tobacco Use  . Smoking status: Former Smoker    Last attempt to quit: 11/25/2008    Years since quitting: 9.7  . Smokeless tobacco: Never Used  Substance Use Topics  . Alcohol use: Yes    Alcohol/week: 2.0 standard drinks    Types: 2 Glasses of wine per week  . Drug use: No    Types: "Crack" cocaine    Comment: quit in 2003    Review of Systems Constitutional: No fever/chills Eyes: No visual changes. ENT: No sore throat. No stiff neck no neck pain Cardiovascular: Denies chest pain. Respiratory: Denies shortness of breath. Gastrointestinal:   no vomiting.  No diarrhea.  No constipation. Genitourinary: Negative for dysuria. Musculoskeletal: Negative lower extremity swelling Skin: Negative for rash. Neurological:  Negative for severe headaches, focal weakness or numbness.   ____________________________________________   PHYSICAL EXAM:  VITAL SIGNS: ED Triage Vitals  Enc Vitals Group     BP 08/21/18 1612 124/75     Pulse Rate 08/21/18 1612 100     Resp 08/21/18 1612 18     Temp 08/21/18 1612 98.4 F (36.9 C)     Temp Source 08/21/18 1612 Oral     SpO2 08/21/18 1612 100 %     Weight 08/21/18 1613 199 lb (90.3 kg)     Height 08/21/18 1613 5\' 5"  (1.651 m)     Head Circumference --      Peak Flow --      Pain Score 08/21/18 1613 10     Pain Loc --      Pain Edu? --      Excl. in [place]? --     Constitutional: Alert and oriented. Well appearing and in no acute distress. Eyes: Conjunctivae are normal Head: Atraumatic HEENT: No congestion/rhinnorhea. Mucous membranes are moist.  Oropharynx non-erythematous Neck:   Nontender with no meningismus, no masses, no stridor Cardiovascular: Normal rate, regular rhythm. Grossly normal heart sounds.  Good peripheral circulation. Respiratory: Normal respiratory effort.  No retractions. Lungs CTAB. Abdominal: Soft and tender to palpation epigastric, right upper quadrant left upper quadrant.  This does reproduce her pain.  There is no guarding or rebound.  Is a soft belly..  Back:  There is no focal tenderness or step off.  there is no midline tenderness there are no lesions noted. there is no CVA tenderness Musculoskeletal: No lower extremity tenderness, no upper extremity tenderness. No joint effusions, no DVT signs strong distal pulses no edema Neurologic:  Normal speech and language. No gross focal neurologic deficits are appreciated.  Skin:  Skin is warm, dry and intact. No rash noted. Psychiatric: Mood and affect are normal. Speech and behavior are normal.  ____________________________________________   LABS (all labs ordered are listed, but only abnormal results are displayed)  Labs Reviewed  CBC - Abnormal; Notable for the following components:       Result Value   WBC  16.6 (*)    RBC 3.85 (*)    Hemoglobin 11.7 (*)    All other components within normal limits  LIPASE, BLOOD  COMPREHENSIVE METABOLIC PANEL  URINALYSIS, COMPLETE (UACMP) WITH MICROSCOPIC  TROPONIN I    Pertinent labs  results that were available during my care of the patient were reviewed by me and considered in my medical decision making (see chart for details). ____________________________________________  EKG  I personally interpreted any EKGs ordered by me or triage Sinus rate 95, normal axis, no acute ST elevation or depression, similar to prior ____________________________________________  RADIOLOGY  Pertinent labs & imaging results that were available during my care of the patient were reviewed by me and considered in my medical decision making (see chart for details). If possible, patient and/or family made aware of any abnormal findings.  No results found. ____________________________________________    PROCEDURES  Procedure(s) performed: None  Procedures  Critical Care performed: None  ____________________________________________  INITIAL IMPRESSION / ASSESSMENT AND PLAN / ED COURSE  Pertinent labs & imaging results that were available during my care of the patient were reviewed by me and considered in my medical decision making (see chart for details).  Patient with diverticulitis, uncomplicated on CT scan at this time, we will start her on broad-spectrum antibiotics.  He states she does not actually have a penicillin allergy her mother thought she might because her brother did, and she has had amoxicillin in the past.  Nonetheless I think cefepime and Flagyl would be a good commendation for her.  We will order that.  We will get blood cultures given her immunocompromise state and she will need to be admitted.  ----------------------------------------- 6:41 PM on 08/21/2018 -----------------------------------------  Patient has no  clinical indication to support a diagnosis of pneumonia, atelectasis seen on CT scan is likely that but we will make the admitting doctor aware  ----------------------------------------- 7:14 PM on 08/21/2018 -----------------------------------------  I have paged her transplant team to ensure they are okay with admission here.  ----------------------------------------- 7:22 PM on 08/21/2018 -----------------------------------------  Patient with history of kidney transplant, we are awaiting call from the transplant coordinators, as to whether they would prefer her admitted here or not.    ----------------------------------------- 7:47 PM on 08/21/2018 -----------------------------------------  Discussed with Dr. Aldona Bar, was able to see the patient's results in care everywhere, he agrees with admission and he agrees with admission here does not feel the patient needs to be transported.  Accordingly, we are going to talk to her admission team ____________________________________________   FINAL CLINICAL IMPRESSION(S) / ED DIAGNOSES  Final diagnoses:  None      This chart was dictated using voice recognition software.  Despite best efforts to proofread,  errors can occur which can change meaning.      Schuyler Amor, MD 08/21/18 1733    Schuyler Amor, MD 08/21/18 1840    Schuyler Amor, MD 08/21/18 1842    Schuyler Amor, MD 08/21/18 1914    Schuyler Amor, MD 08/21/18 Colbert Coyer, MD 08/21/18 [phone]

## 2018-08-22 LAB — CBC
HCT: 31.9 % — ABNORMAL LOW (ref 36.0–46.0)
Hemoglobin: 10.1 g/dL — ABNORMAL LOW (ref 12.0–15.0)
MCH: 30.8 pg (ref 26.0–34.0)
MCHC: 31.7 g/dL (ref 30.0–36.0)
MCV: 97.3 fL (ref 80.0–100.0)
Platelets: 254 10*3/uL (ref 150–400)
RBC: 3.28 MIL/uL — ABNORMAL LOW (ref 3.87–5.11)
RDW: 12.4 % (ref 11.5–15.5)
WBC: 15 10*3/uL — ABNORMAL HIGH (ref 4.0–10.5)
nRBC: 0 % (ref 0.0–0.2)

## 2018-08-22 LAB — URINALYSIS, COMPLETE (UACMP) WITH MICROSCOPIC
Bilirubin Urine: NEGATIVE
Glucose, UA: NEGATIVE mg/dL
KETONES UR: NEGATIVE mg/dL
Leukocytes, UA: NEGATIVE
Nitrite: NEGATIVE
Protein, ur: NEGATIVE mg/dL
SPECIFIC GRAVITY, URINE: 1.009 (ref 1.005–1.030)
pH: 6 (ref 5.0–8.0)

## 2018-08-22 LAB — BASIC METABOLIC PANEL
Anion gap: 7 (ref 5–15)
BUN: 27 mg/dL — ABNORMAL HIGH (ref 6–20)
CO2: 19 mmol/L — ABNORMAL LOW (ref 22–32)
CREATININE: 2.21 mg/dL — AB (ref 0.44–1.00)
Calcium: 9.4 mg/dL (ref 8.9–10.3)
Chloride: 107 mmol/L (ref 98–111)
GFR calc Af Amer: 29 mL/min — ABNORMAL LOW (ref >60)
GFR calc non Af Amer: 25 mL/min — ABNORMAL LOW (ref >60)
GLUCOSE: 126 mg/dL — AB (ref 70–99)
Potassium: 4.9 mmol/L (ref 3.5–5.1)
Sodium: 133 mmol/L — ABNORMAL LOW (ref 135–145)

## 2018-08-22 LAB — GLUCOSE, CAPILLARY
GLUCOSE-CAPILLARY: 186 mg/dL — AB (ref 70–99)
Glucose-Capillary: 154 mg/dL — ABNORMAL HIGH (ref 70–99)
Glucose-Capillary: 147 mg/dL — ABNORMAL HIGH (ref 70–99)
Glucose-Capillary: 121 mg/dL — ABNORMAL HIGH (ref 70–99)

## 2018-08-22 MED ORDER — CARVEDILOL 12.5 MG PO TABS
12.5000 mg | ORAL_TABLET | Freq: Two times a day (BID) | ORAL | Status: DC
  Administered 2018-08-22 – 2018-08-24 (×3): 12.5 mg via ORAL
  Filled 2018-08-22 (×6): qty 1

## 2018-08-22 NOTE — Progress Notes (Deleted)
Cardiology Office Note Date:  08/22/2018  Patient ID:  [name] 08-06-1965, MRN [id number] PCP:  Valerie Roys, DO  Cardiologist:  Dr. Rockey Situ, MD  ***refresh   Chief Complaint: Hospital follow-up  History of Present Illness: [name] is a 53 y.o. female with history of SVT previously followed by Dr. Rockey Situ, CVA, DM 2, ESRD status post renal transplant, seizure disorder, HIT, and HTN who presents for hospital follow-up after recent admission to [facility] from *** to *** for ***.  Patient was admitted to the hospital in early 08/2018 with considerable stress over the preceding several weeks secondary to the death of 3 family members.  In this setting, she began to notice palpitations with associated diaphoresis, shortness of breath, and chest discomfort which she felt like were brought on by hearing of the death of her cousin.  She was noted to have been visiting a friend at [facility] on 08/09/2023 and developed intermittent episodes of generalized fatigue and chest pressure prompting her to be evaluated in the ED.  ***   Past Medical History:  Diagnosis Date  . Arrhythmia   . Cerebrovascular accident [facility]) 2010   without any focal neurological deficits.   . Clotted renal dialysis AV graft ([place])   . Dependence on hemodialysis ([place]) 11/17/2013  . Diabetes mellitus without complication ([place])   . Ductal carcinoma in situ (DCIS) of left breast   . End stage renal disease ([place])    a. previously on dialysis on Tues, Thurs, & 10-25-22 x 7 yrs; b. s/p deceased donor transplant Mar 25, 2014   . GERD (gastroesophageal reflux disease)   . History of methicillin resistant Staphylococcus aureus infection   . History of stress test    a. 04/2013: normal, EF 65%  . HIT (heparin-induced thrombocytopenia) ([place])   . Hypertension   . Mitral regurgitation    a. 01/2014: EF >55%, LVH, mild MR, dilated LA  . Renal transplant, status post 2014/03/25  . RLS (restless legs syndrome)   . Seizure disorder  ([place])   . Seizures ([place])   . Sepsis ([place]) 08/09/2016  . Stroke due to intracerebral hemorrhage ([place]) 11/17/2013    Past Surgical History:  Procedure Laterality Date  . INSERTION OF DIALYSIS CATHETER     x 2  . KIDNEY TRANSPLANT  25-Mar-2014   right side.  Marland Kitchen MASTECTOMY PARTIAL / LUMPECTOMY Left 09/24/2015  . MASTECTOMY PARTIAL / LUMPECTOMY Left 10/25/2015    No outpatient medications have been marked as taking for the 08/23/18 encounter (Appointment) with Rise Mu, PA-C.    Allergies:   Heparin; Penicillin g; Ibuprofen; Other; and Penicillins   Social History:  The patient  reports that she quit smoking about 9 years ago. She has never used smokeless tobacco. She reports current alcohol use of about 2.0 standard drinks of alcohol per week. She reports that she does not use drugs.   Family History:  The patient's family history includes Aneurysm in her brother; Cancer in her mother and paternal grandmother; Diabetes in her maternal grandfather and maternal grandmother; Hearing loss in her maternal grandmother; Heart attack in her father and mother; Heart failure in her maternal grandmother; Hyperlipidemia in her father and mother; Hypertension in her father and mother; Kidney failure in her sister; Other in her sister; Stroke in her maternal grandfather and paternal grandfather.  ROS:   ROS   PHYSICAL EXAM: *** VS:  LMP 03/03/2016 (Approximate)  BMI: There is no height or weight on file to  calculate BMI.  Physical Exam   EKG:  Was ordered and interpreted by me today. Shows ***  Recent Labs: 06/06/2018: Magnesium 2.1 08/16/2018: TSH 1.060 08/21/2018: ALT 12 08/22/2018: BUN 27; Creatinine, Ser 2.21; Hemoglobin 10.1; Platelets 254; Potassium 4.9; Sodium 133  08/16/2018: Cholesterol, Total 214; HDL 28; LDL Calculated Comment; Triglycerides 421   Estimated Creatinine Clearance: 33.4 mL/min (A) (by C-G formula based on SCr of 2.21 mg/dL (H)).   Wt Readings from Last 3 Encounters:    08/21/18 199 lb (90.3 kg)  08/16/18 198 lb (89.8 kg)  08/09/18 199 lb 1.6 oz (90.3 kg)     Other studies reviewed: Additional studies/records reviewed today include: summarized above  ASSESSMENT AND PLAN:  1. ***  Disposition: F/u with Dr. Rockey Situ or APP in ***  Current medicines are reviewed at length with the patient today.  The patient did not have any concerns regarding medicines.  Signed, Christell Faith, PA-C 08/22/2018 8:06 AM     [facility] [place], [place] [address] [phone]

## 2018-08-22 NOTE — Progress Notes (Signed)
[place] at [place] NAME: Menna Abeln    MR#:  [id number]  DATE OF BIRTH:  May 09, 1966  SUBJECTIVE:  CHIEF COMPLAINT:   Chief Complaint  Patient presents with  . Chest Pain  . Abdominal Pain   No new complaint this morning.  No fevers.  No nausea vomiting or diarrhea.  Abdominal pain is improving.  Patient tolerating current diet. No fevers.  No cough or shortness of breath.  REVIEW OF SYSTEMS:  Review of Systems  Constitutional: Negative for chills, fever and weight loss.  HENT: Negative for ear discharge and hearing loss.   Eyes: Negative for blurred vision and double vision.  Respiratory: Negative for cough and hemoptysis.   Cardiovascular: Negative for chest pain and palpitations.  Gastrointestinal: Positive for abdominal pain. Negative for heartburn, nausea and vomiting.  Genitourinary: Negative for dysuria, frequency and urgency.  Musculoskeletal: Negative for myalgias and neck pain.  Skin: Negative for itching and rash.  Neurological: Negative for dizziness and headaches.  Psychiatric/Behavioral: Negative for depression and suicidal ideas.    DRUG ALLERGIES:   Allergies  Allergen Reactions  . Heparin     Heparin induced THrombocytopenia "i bled for a long time and had to have 2 blood transfusions"   . Penicillin G   . Ibuprofen     Caused kidney damage Can't take due to kidneys  . Other     Other reaction(s): Other (See Comments) Causing HITT  . Penicillins     Childhood reaction Patient is unsure if she's allergic, was told she had a reaction as a child but thinks she's taking a "cillin" while sick without having a reaction.  Pt states she has taken amoxicillin in the past and did not have problem   VITALS:  Blood pressure 104/75, pulse 81, temperature 97.9 F (36.6 C), temperature source Oral, resp. rate 20, height 5\' 5"  (1.651 m), weight 90.3 kg, last menstrual period 03/03/2016, SpO2 96 %. PHYSICAL  EXAMINATION:    Physical Exam  Constitutional: She is oriented to person, place, and time and well-developed, well-nourished, and in no distress.  HENT:  Head: Normocephalic and atraumatic.  Eyes: Pupils are equal, round, and reactive to light. Conjunctivae and EOM are normal.  Neck: Normal range of motion. Neck supple. No tracheal deviation present. No thyromegaly present.  Cardiovascular: Normal rate, regular rhythm and normal heart sounds.  Pulmonary/Chest: Effort normal and breath sounds normal.  Abdominal: Soft. Bowel sounds are normal.  Mild left lower quadrant tenderness.  No rebound or guarding.  Musculoskeletal: Normal range of motion.        General: No edema.  Neurological: She is alert and oriented to person, place, and time. No cranial nerve deficit.  Skin: Skin is warm. She is not diaphoretic. No erythema.  Psychiatric: Memory and affect normal.   LABORATORY PANEL:  Female CBC Recent Labs  Lab 08/22/18 0504  WBC 15.0*  HGB 10.1*  HCT 31.9*  PLT 254   ------------------------------------------------------------------------------------------------------------------ Chemistries  Recent Labs  Lab 08/21/18 1636 08/22/18 0504  NA 132* 133*  K 5.0 4.9  CL 105 107  CO2 20* 19*  GLUCOSE 133* 126*  BUN 30* 27*  CREATININE 2.20* 2.21*  CALCIUM 10.2 9.4  AST 14*  --   ALT 12  --   ALKPHOS 43  --   BILITOT 0.5  --    RADIOLOGY:  Ct Abdomen Pelvis Wo Contrast  Result Date: 08/21/2018 CLINICAL DATA:  Epigastric pain.  Patient has renal transplant. EXAM: CT ABDOMEN AND PELVIS WITHOUT CONTRAST TECHNIQUE: Multidetector CT imaging of the abdomen and pelvis was performed following the standard protocol without IV contrast. COMPARISON:  February 16, 2011 FINDINGS: Lower chest: Consolidation of left lower lobe is identified. The heart size is mildly enlarged. Hepatobiliary: No focal liver abnormality is seen. No gallstones, gallbladder wall thickening, or biliary dilatation.  Pancreas: Unremarkable. No pancreatic ductal dilatation or surrounding inflammatory changes. Spleen: Normal in size without focal abnormality. Adrenals/Urinary Tract: The bilateral adrenal glands are normal. Bilateral native kidneys are atrophic. Nonobstructing stones are noted in both native kidneys. A right lower quadrant kidney transplant is identified. There is mild fullness of renal calices of the transplanted kidney. No definite discrete obstructing stone is noted. There is diffuse anterior bladder wall thickening Stomach/Bowel: There is inflammation surrounding the descending colon. Colon is otherwise unremarkable. There is diverticulosis of colon. There is no small bowel obstruction. The appendix is normal. The stomach is normal. Vascular/Lymphatic: Aortic atherosclerosis. No enlarged abdominal or pelvic lymph nodes. Reproductive: Uterus and bilateral adnexa are unremarkable. Other: None. Musculoskeletal: No acute abnormality is noted. IMPRESSION: Inflammation surrounding the descending colon consistent with acute diverticulitis. No focal discrete abscess is identified. Consolidation of left lower lobe. Although the finding may be in part due to atelectasis, focal pneumonia is not excluded. Mild fullness of the renal calices of the transplanted kidney without evidence of focal discrete obstructing stone noted. Electronically Signed   By: Abelardo Diesel M.D.   On: 08/21/2018 18:23   ASSESSMENT AND PLAN:   1. Acute diverticulitis with leukocytosis. Clinically improving.  Abdominal pains improved.  Leukocytosis gradually improving.   Tolerating full liquid diet.  To advance diet further in a.m. Continue empiric antibiotics with IV ciprofloxacin and Flagyl  CBC in a.m.   2.Hyponatremia.  Sodium level gradually improving with IV fluids.  Sodium level of 133 this morning.  3. ESRD s/p kidney transplant.  Creatinine is stable, continue home immune suppression medication.   4. Diabetes.    Continue  sliding scale.  Hold metformin.   5. Hypertension.  Blood pressure controlled. Continue Coreg.  DVT prophylaxis; SCDs Patient with history of heparin-induced thrombocytopenia and bleeding requiring packed red blood cell transfusions in the past.   All the records are reviewed and case discussed with Care Management/Social Worker. Management plans discussed with the patient, and she is in agreement.  CODE STATUS: Full Code  TOTAL TIME TAKING CARE OF THIS PATIENT: 33 minutes.   More than 50% of the time was spent in counseling/coordination of care: YES  POSSIBLE D/C IN 2 DAYS, DEPENDING ON CLINICAL CONDITION.   Emil Weigold M.D on 08/22/2018 at 10:36 AM  Between 7am to 6pm - Pager - [phone]  After 6pm go to [email or web address] - Proofreader  [facility]  Office  [phone]  CC: Primary care physician; Valerie Roys, DO  Note: This dictation was prepared with Dragon dictation along with smaller phrase technology. Any transcriptional errors that result from this process are unintentional.

## 2018-08-23 ENCOUNTER — Ambulatory Visit: Payer: Medicare Other | Admitting: Physician Assistant

## 2018-08-23 LAB — BASIC METABOLIC PANEL
Anion gap: 5 (ref 5–15)
BUN: 28 mg/dL — AB (ref 6–20)
CO2: 21 mmol/L — ABNORMAL LOW (ref 22–32)
CREATININE: 2.36 mg/dL — AB (ref 0.44–1.00)
Calcium: 9.4 mg/dL (ref 8.9–10.3)
Chloride: 110 mmol/L (ref 98–111)
GFR calc Af Amer: 27 mL/min — ABNORMAL LOW (ref >60)
GFR calc non Af Amer: 23 mL/min — ABNORMAL LOW (ref >60)
GLUCOSE: 129 mg/dL — AB (ref 70–99)
Potassium: 5 mmol/L (ref 3.5–5.1)
Sodium: 136 mmol/L (ref 135–145)

## 2018-08-23 LAB — CBC
HCT: 31.3 % — ABNORMAL LOW (ref 36.0–46.0)
Hemoglobin: 9.8 g/dL — ABNORMAL LOW (ref 12.0–15.0)
MCH: 30.5 pg (ref 26.0–34.0)
MCHC: 31.3 g/dL (ref 30.0–36.0)
MCV: 97.5 fL (ref 80.0–100.0)
Platelets: 234 10*3/uL (ref 150–400)
RBC: 3.21 MIL/uL — ABNORMAL LOW (ref 3.87–5.11)
RDW: 12.3 % (ref 11.5–15.5)
WBC: 8.4 10*3/uL (ref 4.0–10.5)
nRBC: 0 % (ref 0.0–0.2)

## 2018-08-23 LAB — GLUCOSE, CAPILLARY
Glucose-Capillary: 145 mg/dL — ABNORMAL HIGH (ref 70–99)
Glucose-Capillary: 164 mg/dL — ABNORMAL HIGH (ref 70–99)
Glucose-Capillary: 172 mg/dL — ABNORMAL HIGH (ref 70–99)
Glucose-Capillary: 122 mg/dL — ABNORMAL HIGH (ref 70–99)

## 2018-08-23 LAB — MAGNESIUM: Magnesium: 2.2 mg/dL (ref 1.7–2.4)

## 2018-08-23 NOTE — Progress Notes (Signed)
   08/23/18 1500  Clinical Encounter Type  Visited With Patient not available  Visit Type Initial  Referral From Chaplain  Consult/Referral To None  Chaplain was rounding and went to visit patient and nurse stated patient was taking a nap. Chaplain stated she would visit at another time.

## 2018-08-23 NOTE — Progress Notes (Signed)
[place] at [place] NAME: [name]    MR#:  [id number]  DATE OF BIRTH:  Aug 31, 1965  SUBJECTIVE:  CHIEF COMPLAINT:   Chief Complaint  Patient presents with  . Chest Pain  . Abdominal Pain   No new complaint this morning.  No fevers.  No nausea vomiting or diarrhea.  Abdominal pain is continues to improve.  Tolerating regular consistency diet well.  No fevers.  Not feeling well enough to go home today.  REVIEW OF SYSTEMS:  Review of Systems  Constitutional: Negative for chills, fever and weight loss.  HENT: Negative for ear discharge and hearing loss.   Eyes: Negative for blurred vision and double vision.  Respiratory: Negative for cough and hemoptysis.   Cardiovascular: Negative for chest pain and palpitations.  Gastrointestinal: Positive for abdominal pain. Negative for heartburn, nausea and vomiting.  Genitourinary: Negative for dysuria, frequency and urgency.  Musculoskeletal: Negative for myalgias and neck pain.  Skin: Negative for itching and rash.  Neurological: Negative for dizziness and headaches.  Psychiatric/Behavioral: Negative for depression and suicidal ideas.    DRUG ALLERGIES:   Allergies  Allergen Reactions  . Heparin     Heparin induced THrombocytopenia "i bled for a long time and had to have 2 blood transfusions"   . Penicillin G   . Ibuprofen     Caused kidney damage Can't take due to kidneys  . Other     Other reaction(s): Other (See Comments) Causing HITT  . Penicillins     Childhood reaction Patient is unsure if she's allergic, was told she had a reaction as a child but thinks she's taking a "cillin" while sick without having a reaction.  Pt states she has taken amoxicillin in the past and did not have problem   VITALS:  Blood pressure 121/85, pulse 71, temperature 98 F (36.7 C), temperature source Oral, resp. rate 20, height 5\' 5"  (1.651 m), weight 90.3 kg, last menstrual period 03/03/2016,  SpO2 99 %. PHYSICAL EXAMINATION:    Physical Exam  Constitutional: She is oriented to person, place, and time and well-developed, well-nourished, and in no distress.  HENT:  Head: Normocephalic and atraumatic.  Eyes: Pupils are equal, round, and reactive to light. Conjunctivae and EOM are normal.  Neck: Normal range of motion. Neck supple. No tracheal deviation present. No thyromegaly present.  Cardiovascular: Normal rate, regular rhythm and normal heart sounds.  Pulmonary/Chest: Effort normal and breath sounds normal.  Abdominal: Soft. Bowel sounds are normal.  Mild left lower quadrant tenderness.  No rebound or guarding.  Musculoskeletal: Normal range of motion.        General: No edema.  Neurological: She is alert and oriented to person, place, and time. No cranial nerve deficit.  Skin: Skin is warm. She is not diaphoretic. No erythema.  Psychiatric: Memory and affect normal.   LABORATORY PANEL:  Female CBC Recent Labs  Lab 08/23/18 0404  WBC 8.4  HGB 9.8*  HCT 31.3*  PLT 234   ------------------------------------------------------------------------------------------------------------------ Chemistries  Recent Labs  Lab 08/21/18 1636  08/23/18 0404  NA 132*   < > 136  K 5.0   < > 5.0  CL 105   < > 110  CO2 20*   < > 21*  GLUCOSE 133*   < > 129*  BUN 30*   < > 28*  CREATININE 2.20*   < > 2.36*  CALCIUM 10.2   < > 9.4  MG  --   --  2.2  AST 14*  --   --   ALT 12  --   --   ALKPHOS 43  --   --   BILITOT 0.5  --   --    < > = values in this interval not displayed.   RADIOLOGY:  No results found. ASSESSMENT AND PLAN:   1. Acute diverticulitis with leukocytosis. Clinically improving.  Abdominal pains improved.  Leukocytosis resolved Tolerating regular consistency diet today. Continue empiric antibiotics with IV ciprofloxacin and Flagyl   2.Hyponatremia.  Resolved with IV fluids.  Sodium level of 136 today  3. ESRD s/p kidney transplant.  Creatinine is fairly  stable, continue home immune suppression medication.   4. Diabetes.    Continue sliding scale.  Hold metformin.   5. Hypertension.  Blood pressure controlled. Continue Coreg.  DVT prophylaxis; SCDs Patient with history of heparin-induced thrombocytopenia and bleeding requiring packed red blood cell transfusions in the past.   All the records are reviewed and case discussed with Care Management/Social Worker. Management plans discussed with the patient, and she is in agreement.  CODE STATUS: Full Code  TOTAL TIME TAKING CARE OF THIS PATIENT: 33 minutes.   More than 50% of the time was spent in counseling/coordination of care: YES  POSSIBLE D/C IN 1 DAY, DEPENDING ON CLINICAL CONDITION.   Erric Machnik M.D on 08/23/2018 at 11:40 AM  Between 7am to 6pm - Pager - [phone]  After 6pm go to [email or web address] - Proofreader  [facility]  Office  [phone]  CC: Primary care physician; Valerie Roys, DO  Note: This dictation was prepared with Dragon dictation along with smaller phrase technology. Any transcriptional errors that result from this process are unintentional.

## 2018-08-23 NOTE — Unmapped (Signed)
[facility] Refill Coordination Note    Specialty Medication(s) to be Shipped:   Transplant: Myfortic 180mg  and Prograf 1mg      Nancy Bowman, DOB: 1966/05/28  Phone: [phone] (home)     All above HIPAA information was verified with patient.     Completed refill call assessment today to schedule patient's medication shipment from the [facility] [phone]).       Specialty medication(s) and dose(s) confirmed: Regimen is correct and unchanged.   Changes to medications: [name] reports no changes reported at this time.  Changes to insurance: No  Questions for the pharmacist: No    The patient will receive a drug information handout for each medication shipped and additional FDA Medication Guides as required.      DISEASE/MEDICATION-SPECIFIC INFORMATION        N/A    ADHERENCE     Medication Adherence    Patient reported X missed doses in the last month:  0  Specialty Medication:  Myfortic 180mg  & Prograf 1mg    Patient is on additional specialty medications:  No  Patient is on more than two specialty medications:  No  Any gaps in refill history greater than 2 weeks in the last 3 months:  no  Demonstrates understanding of importance of adherence:  yes  Informant:  patient  Reliability of informant:  reliable      Adherence tools used:  patient uses a pill box to manage medications          Confirmed plan for next specialty medication refill:  delivery by pharmacy          Refill Coordination    Has the Patients' Contact Information Changed:  No  Is the Shipping Address Different:  No         MEDICARE PART B DOCUMENTATION     Myfortic 180mg : Patient has 9 days worth of tablets on hand.  Prograf 1mg : Patient has 9 days worth of capsules on hand.    SHIPPING     Shipping address confirmed in Epic.     Delivery Scheduled: Yes, Expected medication delivery date: 09/01/2018 via UPS or courier.     Medication will be delivered via UPS to the home address in Epic [place]. [name] P Allena Katz   [facility] Shared [facility] Specialty Technician

## 2018-08-24 ENCOUNTER — Telehealth: Payer: Self-pay | Admitting: Family Medicine

## 2018-08-24 LAB — BASIC METABOLIC PANEL
Anion gap: 5 (ref 5–15)
BUN: 26 mg/dL — AB (ref 6–20)
CO2: 21 mmol/L — ABNORMAL LOW (ref 22–32)
Calcium: 9.6 mg/dL (ref 8.9–10.3)
Chloride: 111 mmol/L (ref 98–111)
Creatinine, Ser: 2.12 mg/dL — ABNORMAL HIGH (ref 0.44–1.00)
GFR calc Af Amer: 30 mL/min — ABNORMAL LOW (ref >60)
GFR calc non Af Amer: 26 mL/min — ABNORMAL LOW (ref >60)
Glucose, Bld: 129 mg/dL — ABNORMAL HIGH (ref 70–99)
Potassium: 5.1 mmol/L (ref 3.5–5.1)
Sodium: 137 mmol/L (ref 135–145)

## 2018-08-24 LAB — MAGNESIUM: Magnesium: 2 mg/dL (ref 1.7–2.4)

## 2018-08-24 LAB — GLUCOSE, CAPILLARY
Glucose-Capillary: 146 mg/dL — ABNORMAL HIGH (ref 70–99)
Glucose-Capillary: 146 mg/dL — ABNORMAL HIGH (ref 70–99)

## 2018-08-24 MED ORDER — HYDROCODONE-ACETAMINOPHEN 5-325 MG PO TABS: 1.0000 | ORAL_TABLET | Freq: Three times a day (TID) | ORAL | 0 refills | Status: DC | PRN

## 2018-08-24 MED ORDER — METRONIDAZOLE 500 MG PO TABS: 500.0000 mg | ORAL_TABLET | Freq: Three times a day (TID) | ORAL | 0 refills | Status: AC

## 2018-08-24 MED ORDER — GLIPIZIDE 5 MG PO TABS: 5.0000 mg | ORAL_TABLET | Freq: Two times a day (BID) | ORAL | 0 refills | Status: DC

## 2018-08-24 MED ORDER — CIPROFLOXACIN HCL 500 MG PO TABS: 500.0000 mg | ORAL_TABLET | Freq: Two times a day (BID) | ORAL | 0 refills | Status: AC

## 2018-08-24 NOTE — Progress Notes (Signed)
Discharge teaching given to patient, patient verbalized understanding and had no questions. Patient IV removed. Patient will be transported home by family. All patient belongings gathered prior to leaving.  

## 2018-08-24 NOTE — Discharge Summary (Signed)
[facility] NAME: [name]    MR#:  [id number]  DATE OF BIRTH:  Nov 13, 1965  DATE OF ADMISSION:  08/21/2018   ADMITTING PHYSICIAN: Demetrios Loll, MD  DATE OF DISCHARGE: 08/24/2018.  PRIMARY CARE PHYSICIAN: Park Liter P, DO   ADMISSION DIAGNOSIS:  Diverticulitis [K57.92] DISCHARGE DIAGNOSIS:  Active Problems:   Acute diverticulitis  SECONDARY DIAGNOSIS:   Past Medical History:  Diagnosis Date  . Arrhythmia   . Cerebrovascular accident [facility]) 2010   without any focal neurological deficits.   . Clotted renal dialysis AV graft ([place])   . 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 Diabetes mellitus without complication ([place])   . Ductal carcinoma in situ (DCIS) of left breast   . End stage renal disease ([place])    a. previously on dialysis on Tues, Thurs, & 11/06/2022 x 7 yrs; b. s/p deceased donor transplant April 06, 2014   . GERD (gastroesophageal reflux disease)   . History of methicillin resistant Staphylococcus aureus infection   . History of stress test    a. 04/2013: normal, EF 65%  . HIT (heparin-induced thrombocytopenia) ([place])   . Hypertension   . Mitral regurgitation    a. 01/2014: EF >55%, LVH, mild MR, dilated LA  . Renal transplant, status post Apr 06, 2014  . RLS (restless legs syndrome)   . Seizure disorder ([place])   . Seizures ([place])   . Sepsis ([place]) 08/09/2016  . Stroke due to intracerebral hemorrhage ([place]) 11/17/2013   HOSPITAL COURSE:  Chief complaint; abdominal pains  HPI Shaniece Bussa  is a 53 y.o. female with a known history of multiple medical problems including end-stage renal disease status post renal transplant, gastroesophageal reflux disease, hypertension, prior history of CVA, diabetes mellitus who presented to the emergency room with complaints of abdominal pain on the lower left side for the past 2 days, which is intermittent, sharp, 8 out of 10 without radiation.  She denies any fever or chills, no  nausea, vomiting or diarrhea, no melena or bloody stool.  She is found leukocytosis.  CT abdominal and pelvis report diverticulitis.  ED physician request admission.  Please refer to the H&P dictated for further details.  HOSPITAL COURSE: 1. Acute diverticulitis with leukocytosis. Significantly improved clinically.  Leukocytosis resolved.  Patient remains afebrile.  Patient was treated with IV ciprofloxacin and Flagyl during this admission for at least 2-3 days..  Tolerating regular consistency diet.  Patient being discharged on p.o. ciprofloxacin and Flagyl for 8 days to complete total of at least 10-day treatment duration.  2.Hyponatremia.  Resolved with IV fluids.  Sodium level of 137 today  3. ESRD s/p kidney transplant. Creatinine is fairly stable, continue home immune suppression medication.   4. Diabetes.   Continue sliding scale. Avoiding resuming metformin due to low GFR between 27-30.  Placed on glipizide 5 mg twice daily.  5. Hypertension.  Blood pressure controlled.Continue Coreg.  Disposition; patient clinically and hemodynamically stable for discharge.  To follow-up with primary care physician for outpatient referral to follow-up with gastroenterologist for possible endoscopic evaluation in 4 to 6 weeks after resolution of diverticulitis   DISCHARGE CONDITIONS:  Stable CONSULTS OBTAINED:   DRUG ALLERGIES:   Allergies  Allergen Reactions  . Heparin     Heparin induced THrombocytopenia "i bled for a long time and had to have 2 blood transfusions"   . Penicillin G   . Ibuprofen     Caused kidney damage Can't take due to  kidneys  . Other     Other reaction(s): Other (See Comments) Causing HITT  . Penicillins     Childhood reaction Patient is unsure if she's allergic, was told she had a reaction as a child but thinks she's taking a "cillin" while sick without having a reaction.  Pt states she has taken amoxicillin in the past and did not have problem    DISCHARGE MEDICATIONS:   Allergies as of 08/24/2018      Reactions   Heparin    Heparin induced THrombocytopenia "i bled for a long time and had to have 2 blood transfusions"   Penicillin G    Ibuprofen    Caused kidney damage Can't take due to kidneys   Other    Other reaction(s): Other (See Comments) Causing HITT   Penicillins    Childhood reaction Patient is unsure if she's allergic, was told she had a reaction as a child but thinks she's taking a "cillin" while sick without having a reaction.  Pt states she has taken amoxicillin in the past and did not have problem      Medication List    STOP taking these medications   metFORMIN 500 MG 24 hr tablet Commonly known as:  GLUCOPHAGE-XR     TAKE these medications   aspirin EC 81 MG tablet Take 81 mg by mouth daily.   atorvastatin 40 MG tablet Commonly known as:  LIPITOR Take 1 tablet (40 mg total) by mouth daily.   carvedilol 12.5 MG tablet Commonly known as:  COREG Take 12.5 mg by mouth 2 (two) times daily with a meal.   cetirizine 10 MG tablet Commonly known as:  ZYRTEC Take 1 tablet (10 mg total) by mouth daily as needed for allergies or rhinitis.   cholecalciferol 25 MCG (1000 UT) tablet Commonly known as:  VITAMIN D Take 1,000 Units by mouth daily.   ciprofloxacin 500 MG tablet Commonly known as:  CIPRO Take 1 tablet (500 mg total) by mouth 2 (two) times daily for 8 days.   fluticasone 50 MCG/ACT nasal spray Commonly known as:  FLONASE Two sprays each nare twice a day. What changed:  See the new instructions.   gabapentin 300 MG capsule Commonly known as:  NEURONTIN Take 600 mg by mouth 3 (three) times daily.   glipiZIDE 5 MG tablet Commonly known as:  GLUCOTROL Take 1 tablet (5 mg total) by mouth 2 (two) times daily for 30 days.   HYDROcodone-acetaminophen 5-325 MG tablet Commonly known as:  NORCO/VICODIN Take 1 tablet by mouth 3 (three) times daily as needed for moderate pain or severe  pain.   levETIRAcetam 500 MG tablet Commonly known as:  KEPPRA Take 500 mg by mouth 2 (two) times daily.   magnesium oxide 400 MG tablet Commonly known as:  MAG-OX Take 400 mg by mouth daily.   metroNIDAZOLE 500 MG tablet Commonly known as:  FLAGYL Take 1 tablet (500 mg total) by mouth 3 (three) times daily for 8 days.   MYFORTIC 180 MG EC tablet Generic drug:  mycophenolate Take 360 mg by mouth 3 (three) times daily.   nitroGLYCERIN 0.4 MG SL tablet Commonly known as:  NITROSTAT Place 1 tablet (0.4 mg total) under the tongue every 5 (five) minutes x 3 doses as needed for chest pain.   omeprazole 20 MG capsule Commonly known as:  PRILOSEC Take 1 capsule (20 mg total) by mouth daily as needed (GERD or heartburn).   sodium bicarbonate 650 MG tablet Take  2 tablets (1300 mg) three times a day.   tacrolimus 1 MG capsule Commonly known as:  PROGRAF Take 4 mg by mouth 2 (two) times daily.   tamoxifen 20 MG tablet Commonly known as:  NOLVADEX Take 20 mg by mouth daily.   valACYclovir 1000 MG tablet Commonly known as:  VALTREX Take 1 tablet (1,000 mg total) by mouth 2 (two) times daily.        DISCHARGE INSTRUCTIONS:   DIET:  Cardiac diet.  With 1800 ADA diet restriction DISCHARGE CONDITION:  Stable ACTIVITY:  Activity as tolerated OXYGEN:  Home Oxygen: No.  Oxygen Delivery: room air DISCHARGE LOCATION:  home   If you experience worsening of your admission symptoms, develop shortness of breath, life threatening emergency, suicidal or homicidal thoughts you must seek medical attention immediately by calling 911 or calling your MD immediately  if symptoms less severe.  You Must read complete instructions/literature along with all the possible adverse reactions/side effects for all the Medicines you take and that have been prescribed to you. Take any new Medicines after you have completely understood and accpet all the possible adverse reactions/side effects.    Please note  You were cared for by a hospitalist during your hospital stay. If you have any questions about your discharge medications or the care you received while you were in the hospital after you are discharged, you can call the unit and asked to speak with the hospitalist on call if the hospitalist that took care of you is not available. Once you are discharged, your primary care physician will handle any further medical issues. Please note that NO REFILLS for any discharge medications will be authorized once you are discharged, as it is imperative that you return to your primary care physician (or establish a relationship with a primary care physician if you do not have one) for your aftercare needs so that they can reassess your need for medications and monitor your lab values.    On the day of Discharge:  VITAL SIGNS:  Blood pressure 125/89, pulse 66, temperature (!) 97.4 F (36.3 C), temperature source Oral, resp. rate 18, height 5\' 5"  (1.651 m), weight 90.3 kg, last menstrual period 03/03/2016, SpO2 97 %. PHYSICAL EXAMINATION:  GENERAL:  52 y.o.-year-old patient lying in the bed with no acute distress.  EYES: Pupils equal, round, reactive to light and accommodation. No scleral icterus. Extraocular muscles intact.  HEENT: Head atraumatic, normocephalic. Oropharynx and nasopharynx clear.  NECK:  Supple, no jugular venous distention. No thyroid enlargement, no tenderness.  LUNGS: Normal breath sounds bilaterally, no wheezing, rales,rhonchi or crepitation. No use of accessory muscles of respiration.  CARDIOVASCULAR: S1, S2 normal. No murmurs, rubs, or gallops.  ABDOMEN: Soft, non-tender, non-distended. Bowel sounds present. No organomegaly or mass.  EXTREMITIES: No pedal edema, cyanosis, or clubbing.  NEUROLOGIC: Cranial nerves II through XII are intact. Muscle strength 5/5 in all extremities. Sensation intact. Gait not checked.  PSYCHIATRIC: The patient is alert and oriented x 3.   SKIN: No obvious rash, lesion, or ulcer.  DATA REVIEW:   CBC Recent Labs  Lab 08/23/18 0404  WBC 8.4  HGB 9.8*  HCT 31.3*  PLT 234    Chemistries  Recent Labs  Lab 08/21/18 1636  08/24/18 0346  NA 132*   < > 137  K 5.0   < > 5.1  CL 105   < > 111  CO2 20*   < > 21*  GLUCOSE 133*   < > 129*  BUN 30*   < > 26*  CREATININE 2.20*   < > 2.12*  CALCIUM 10.2   < > 9.6  MG  --    < > 2.0  AST 14*  --   --   ALT 12  --   --   ALKPHOS 43  --   --   BILITOT 0.5  --   --    < > = values in this interval not displayed.     Microbiology Results  Results for orders placed or performed during the hospital encounter of 08/21/18  Blood culture (routine x 2)     Status: None (Preliminary result)   Collection Time: 08/21/18  8:16 PM  Result Value Ref Range Status   Specimen Description BLOOD LEFT ANTECUBITAL  Final   Special Requests   Final    BOTTLES DRAWN AEROBIC AND ANAEROBIC Blood Culture adequate volume   Culture   Final    NO GROWTH 3 DAYS Performed at [facility], [address], [place], [place] [address]    Report Status PENDING  Incomplete  Blood culture (routine x 2)     Status: None (Preliminary result)   Collection Time: 08/21/18  8:16 PM  Result Value Ref Range Status   Specimen Description BLOOD BLOOD LEFT HAND  Final   Special Requests   Final    BOTTLES DRAWN AEROBIC AND ANAEROBIC Blood Culture adequate volume   Culture   Final    NO GROWTH 3 DAYS Performed at [facility], [address], [place], [place] [address]    Report Status PENDING  Incomplete    RADIOLOGY:  No results found.   Management plans discussed with the patient, family and they are in agreement.  CODE STATUS: Full Code   TOTAL TIME TAKING CARE OF THIS PATIENT: 36 minutes.    Yuli Lanigan M.D on 08/24/2018 at 9:54 AM  Between 7am to 6pm - Pager - [phone]  After 6pm go to [email or web address] - Proofreader  [facility]   Office  [phone]  CC: Primary care physician; Valerie Roys, DO   Note: This dictation was prepared with Dragon dictation along with smaller phrase technology. Any transcriptional errors that result from this process are unintentional.

## 2018-08-24 NOTE — Care Management Important Message (Signed)
Copy of signed Medicare IM left with patient in room. 

## 2018-08-24 NOTE — Discharge Instructions (Signed)

## 2018-08-24 NOTE — Telephone Encounter (Signed)
Copied from [place] [id number]. Topic: Quick Communication - Rx Refill/Question >> Aug 24, 2018  1:07 PM Margot Ables wrote: Medication: pt lost her diabetic meter/strips/lancets and cannot check blood sugar. She states she asked Dr. Wynetta Emery last week at [facility]. Please send RX for new meter and all supplies to her pharmacy. She isn't sure brand meter she had before or which is covered by insurance. Has the patient contacted their pharmacy? Yes - new RX needed Preferred Pharmacy (with phone number or street name): [facility], [place] - [place] [phone] (Phone) [phone] (Fax)

## 2018-08-25 ENCOUNTER — Telehealth: Payer: Self-pay

## 2018-08-25 NOTE — Telephone Encounter (Signed)
Transition Care Management Follow-up Telephone Call  Date of discharge and from where: [facility] on 08/24/18  How have you been since you were released from the hospital? Pt states she is doing better. She started having mid abdominal pain intermittently today and pain level reaches about an 8 when occuring. Didn't have pain yesterday. Pt states the pain can be sharp but not as bad as previously. Has not had a bowel movement in 2 days but she is taking her stool softner daily. Declines fever, swelling or n/v/d.   Any questions or concerns? No   Items Reviewed:  Did the pt receive and understand the discharge instructions provided? Yes   Medications obtained and verified? Yes   Any new allergies since your discharge? No   Dietary orders reviewed? Yes  Do you have support at home? Yes   Other (ie: DME, Home Health, etc) N/A  Functional Questionnaire: (I = Independent and D = Dependent)  Bathing/Dressing- I   Meal Prep- I  Eating- I  Maintaining continence- I  Transferring/Ambulation- Use a cane as needed.  Managing Meds- I   Follow up appointments reviewed:    PCP Hospital f/u appt confirmed? Yes  Scheduled to see Dr. Wynetta Emery on 08/26/18 @ 2:30 PM.  [facility] f/u appt confirmed? N/A  Are transportation arrangements needed? No   If their condition worsens, is the pt aware to call  their PCP or go to the ED? Yes  Was the patient provided with contact information for the PCP's office or ED? Yes  Was the pt encouraged to call back with questions or concerns? Yes

## 2018-08-25 NOTE — Telephone Encounter (Signed)
Please get diabetic testing supply form for patient. Thanks!

## 2018-08-25 NOTE — Telephone Encounter (Signed)
Form filled out, signed by provider, and faxed to pharmacy. Called and left patient a VM letting her know this was done for her.

## 2018-08-25 NOTE — Telephone Encounter (Signed)
-----   Message from Valerie Roys, [place] sent at 08/25/2018  8:09 AM EST -----   ----- Message ----- From: Shaune Pollack Sent: 08/24/2018   1:17 PM EST To: Valerie Roys, DO

## 2018-08-26 ENCOUNTER — Other Ambulatory Visit: Payer: Self-pay

## 2018-08-26 ENCOUNTER — Ambulatory Visit (INDEPENDENT_AMBULATORY_CARE_PROVIDER_SITE_OTHER): Payer: Medicare Other | Admitting: Family Medicine

## 2018-08-26 ENCOUNTER — Encounter: Payer: Self-pay | Admitting: Family Medicine

## 2018-08-26 VITALS — BP 110/73 | HR 83 | Temp 97.7°F | Ht 65.0 in | Wt 199.0 lb

## 2018-08-26 DIAGNOSIS — N183 Chronic kidney disease, stage 3 (moderate): Secondary | ICD-10-CM | POA: Diagnosis not present

## 2018-08-26 DIAGNOSIS — K5792 Diverticulitis of intestine, part unspecified, without perforation or abscess without bleeding: Secondary | ICD-10-CM | POA: Diagnosis not present

## 2018-08-26 DIAGNOSIS — E871 Hypo-osmolality and hyponatremia: Secondary | ICD-10-CM | POA: Diagnosis not present

## 2018-08-26 DIAGNOSIS — E1122 Type 2 diabetes mellitus with diabetic chronic kidney disease: Secondary | ICD-10-CM

## 2018-08-26 LAB — CULTURE, BLOOD (ROUTINE X 2)
Culture: NO GROWTH
Culture: NO GROWTH
Special Requests: ADEQUATE
Special Requests: ADEQUATE

## 2018-08-26 NOTE — Patient Instructions (Signed)
Diverticulitis  Diverticulitis is infection or inflammation of small pouches (diverticula) in the colon that form due to a condition called diverticulosis. Diverticula can trap stool (feces) and bacteria, causing infection and inflammation. Diverticulitis may cause severe stomach pain and diarrhea. It may lead to tissue damage in the colon that causes bleeding. The diverticula may also burst (rupture) and cause infected stool to enter other areas of the abdomen. Complications of diverticulitis can include:  Bleeding.  Severe infection.  Severe pain.  Rupture (perforation) of the colon.  Blockage (obstruction) of the colon. What are the causes? This condition is caused by stool becoming trapped in the diverticula, which allows bacteria to grow in the diverticula. This leads to inflammation and infection. What increases the risk? You are more likely to develop this condition if:  You have diverticulosis. The risk for diverticulosis increases if: ? You are overweight or obese. ? You use tobacco products. ? You do not get enough exercise.  You eat a diet that does not include enough fiber. High-fiber foods include fruits, vegetables, beans, nuts, and whole grains. What are the signs or symptoms? Symptoms of this condition may include:  Pain and tenderness in the abdomen. The pain is normally located on the left side of the abdomen, but it may occur in other areas.  Fever and chills.  Bloating.  Cramping.  Nausea.  Vomiting.  Changes in bowel routines.  Blood in your stool. How is this diagnosed? This condition is diagnosed based on:  Your medical history.  A physical exam.  Tests to make sure there is nothing else causing your condition. These tests may include: ? Blood tests. ? Urine tests. ? Imaging tests of the abdomen, including X-rays, ultrasounds, MRIs, or CT scans. How is this treated? Most cases of this condition are mild and can be treated at home.  Treatment may include:  Taking over-the-counter pain medicines.  Following a clear liquid diet.  Taking antibiotic medicines by mouth.  Rest. More severe cases may need to be treated at a hospital. Treatment may include:  Not eating or drinking.  Taking prescription pain medicine.  Receiving antibiotic medicines through an IV tube.  Receiving fluids and nutrition through an IV tube.  Surgery. When your condition is under control, your health care provider may recommend that you have a colonoscopy. This is an exam to look at the entire large intestine. During the exam, a lubricated, bendable tube is inserted into the anus and then passed into the rectum, colon, and other parts of the large intestine. A colonoscopy can show how severe your diverticula are and whether something else may be causing your symptoms. Follow these instructions at home: Medicines  Take over-the-counter and prescription medicines only as told by your health care provider. These include fiber supplements, probiotics, and stool softeners.  If you were prescribed an antibiotic medicine, take it as told by your health care provider. Do not stop taking the antibiotic even if you start to feel better.  Do not drive or use heavy machinery while taking prescription pain medicine. General instructions   Follow a full liquid diet or another diet as directed by your health care provider. After your symptoms improve, your health care provider may tell you to change your diet. He or she may recommend that you eat a diet that contains at least 25 g (25 grams) of fiber daily. Fiber makes it easier to pass stool. Healthy sources of fiber include: ? Berries. One cup contains 4-8 grams of   fiber. ? Beans or lentils. One half cup contains 5-8 grams of fiber. ? Green vegetables. One cup contains 4 grams of fiber.  Exercise for at least 30 minutes, 3 times each week. You should exercise hard enough to raise your heart rate and  break a sweat.  Keep all follow-up visits as told by your health care provider. This is important. You may need a colonoscopy. Contact a health care provider if:  Your pain does not improve.  You have a hard time drinking or eating food.  Your bowel movements do not return to normal. Get help right away if:  Your pain gets worse.  Your symptoms do not get better with treatment.  Your symptoms suddenly get worse.  You have a fever.  You vomit more than one time.  You have stools that are bloody, black, or tarry. Summary  Diverticulitis is infection or inflammation of small pouches (diverticula) in the colon that form due to a condition called diverticulosis. Diverticula can trap stool (feces) and bacteria, causing infection and inflammation.  You are at higher risk for this condition if you have diverticulosis and you eat a diet that does not include enough fiber.  Most cases of this condition are mild and can be treated at home. More severe cases may need to be treated at a hospital.  When your condition is under control, your health care provider may recommend that you have an exam called a colonoscopy. This exam can show how severe your diverticula are and whether something else may be causing your symptoms. This information is not intended to replace advice given to you by your health care provider. Make sure you discuss any questions you have with your health care provider. Document Released: 04/29/2005 Document Revised: 08/22/2016 Document Reviewed: 08/22/2016 Elsevier Interactive Patient Education  2019 [employer]  

## 2018-08-26 NOTE — Progress Notes (Signed)
BP 110/73   Pulse 83   Temp 97.7 F (36.5 C) (Oral)   Ht 5' 5" (1.651 m)   Wt 199 lb (90.3 kg)   LMP 03/03/2016 (Approximate)   SpO2 99%   BMI 33.12 kg/m    Subjective:    Patient ID: [name], female    DOB: May 26, 1966, 53 y.o.   MRN: [id number]  HPI: [name] is a 53 y.o. female  Chief Complaint  Patient presents with  . Hospitalization Follow-up    pt states went to the hospital last sunday with abdominal pain   . Referral    GI   Transition of Care Hospital Follow up.   Hospital/Facility: [facility] D/C Physician: Dr. Bridgett Larsson D/C Date: 08/24/18  Records Requested: 08/26/18 Records Received:  08/26/18 Records Reviewed:  08/26/18  Diagnoses on Discharge: Acute Diverticulitis  Date of interactive Contact within 48 hours of discharge: 08/25/18 Contact was through: phone  Date of 7 day or 14 day face-to-face visit: 08/26/18  within 7 days  Outpatient Encounter Medications as of 08/26/2018  Medication Sig  . aspirin EC 81 MG tablet Take 81 mg by mouth daily.  Marland Kitchen atorvastatin (LIPITOR) 40 MG tablet Take 1 tablet (40 mg total) by mouth daily.  . carvedilol (COREG) 12.5 MG tablet Take 12.5 mg by mouth 2 (two) times daily with a meal.   . cetirizine (ZYRTEC) 10 MG tablet Take 1 tablet (10 mg total) by mouth daily as needed for allergies or rhinitis.  . cholecalciferol (VITAMIN D) 25 MCG (1000 UT) tablet Take 1,000 Units by mouth daily.  . ciprofloxacin (CIPRO) 500 MG tablet Take 1 tablet (500 mg total) by mouth 2 (two) times daily for 8 days.  Marland Kitchen docusate sodium (COLACE) 100 MG capsule Take 100 mg by mouth daily.  . fluticasone (FLONASE) 50 MCG/ACT nasal spray Two sprays each nare twice a day. (Patient taking differently: Place 2 sprays into both nostrils daily. Marland Kitchen)  . gabapentin (NEURONTIN) 300 MG capsule Take 600 mg by mouth 3 (three) times daily.   Marland Kitchen glipiZIDE (GLUCOTROL) 5 MG tablet Take 1 tablet (5 mg total) by mouth 2 (two) times daily for 30 days.  Marland Kitchen  HYDROcodone-acetaminophen (NORCO/VICODIN) 5-325 MG tablet Take 1 tablet by mouth 3 (three) times daily as needed for moderate pain or severe pain.  Marland Kitchen levETIRAcetam (KEPPRA) 500 MG tablet Take 500 mg by mouth 2 (two) times daily.   . magnesium oxide (MAG-OX) 400 MG tablet Take 400 mg by mouth daily.   . metroNIDAZOLE (FLAGYL) 500 MG tablet Take 1 tablet (500 mg total) by mouth 3 (three) times daily for 8 days.  . mycophenolate (MYFORTIC) 180 MG EC tablet Take 360 mg by mouth 3 (three) times daily.   . nitroGLYCERIN (NITROSTAT) 0.4 MG SL tablet Place 1 tablet (0.4 mg total) under the tongue every 5 (five) minutes x 3 doses as needed for chest pain.  Marland Kitchen omeprazole (PRILOSEC) 20 MG capsule Take 1 capsule (20 mg total) by mouth daily as needed (GERD or heartburn).  . sodium bicarbonate 650 MG tablet Take 2 tablets (1300 mg) three times a day.  . tacrolimus (PROGRAF) 1 MG capsule Take 4 mg by mouth 2 (two) times daily.   . tamoxifen (NOLVADEX) 20 MG tablet Take 20 mg by mouth daily.  . valACYclovir (VALTREX) 1000 MG tablet Take 1 tablet (1,000 mg total) by mouth 2 (two) times daily.  . Blood Glucose Monitoring Suppl (ONE TOUCH ULTRA 2) w/Device KIT   .  Lancets (ONETOUCH ULTRASOFT) lancets   . ONE TOUCH ULTRA TEST test strip    No facility-administered encounter medications on file as of 08/26/2018.    Per Hospitalist: " HOSPITAL COURSE:  Chief complaint; abdominal pains  HPI [name] a52 y.o.femalewith a known history of multiple medical problems including end-stage renal disease status post renal transplant, gastroesophageal reflux disease, hypertension, prior history of CVA, diabetes mellitus who presented to the emergency room with complaints of abdominal pain on the lower left side for the past 2 days,which is intermittent, sharp, 8 out of 10 without radiation. She denies any fever or chills, no nausea, vomiting or diarrhea, no melena or bloody stool. She is found leukocytosis. CT  abdominal and pelvis report diverticulitis. ED physician request admission.  Please refer to the H&P dictated for further details.  HOSPITAL COURSE: 1. Acute diverticulitis with leukocytosis. Significantly improved clinically.  Leukocytosis resolved.  Patient remains afebrile.  Patient was treated with IV ciprofloxacin and Flagyl during this admission for at least 2-3 days..  Tolerating regular consistency diet.  Patient being discharged on p.o. ciprofloxacin and Flagyl for 8 days to complete total of at least 10-day treatment duration.  2.Hyponatremia. Resolved with IV fluids. Sodium level of 137 today  3. ESRD s/p kidney transplant. Creatinine isfairlystable, continue home immune suppression medication.   4. Diabetes. Continue sliding scale. Avoiding resuming metformin due to low GFR between 27-30.  Placed on glipizide 5 mg twice daily.  5. Hypertension. Blood pressure controlled.Continue Coreg.  Disposition; patient clinically and hemodynamically stable for discharge.  To follow-up with primary care physician for outpatient referral to follow-up with gastroenterologist for possible endoscopic evaluation in 4 to 6 weeks after resolution of diverticulitis"    Diagnostic Tests Reviewed:   Disposition: Home  Consults: None  Discharge Instructions:  Follow up here and with GI in 4-6 weeks  Disease/illness Education: Given today in writing  Home Health/Community Services Discussions/Referrals: GI referral made today  Establishment or re-establishment of referral orders for community resources: None  Discussion with other health care providers: None  Assessment and Support of treatment regimen adherence: Good  Appointments Coordinated with: Patient  Education for self-management, independent living, and ADLs: Fair  Feeling about 100% better. Finishing her antibiotics. No diarrhea. No belly pain.  Relevant past medical, surgical, family and social history  reviewed and updated as indicated. Interim medical history since our last visit reviewed. Allergies and medications reviewed and updated.  Review of Systems  Constitutional: Negative.   Respiratory: Negative.   Cardiovascular: Negative.   Psychiatric/Behavioral: Negative.     Per HPI unless specifically indicated above     Objective:    BP 110/73   Pulse 83   Temp 97.7 F (36.5 C) (Oral)   Ht 5' 5" (1.651 m)   Wt 199 lb (90.3 kg)   LMP 03/03/2016 (Approximate)   SpO2 99%   BMI 33.12 kg/m   Wt Readings from Last 3 Encounters:  08/26/18 199 lb (90.3 kg)  08/21/18 199 lb (90.3 kg)  08/16/18 198 lb (89.8 kg)    Physical Exam Vitals signs and nursing note reviewed.  Constitutional:      General: She is not in acute distress.    Appearance: Normal appearance. She is not ill-appearing, toxic-appearing or diaphoretic.  HENT:     Head: Normocephalic and atraumatic.     Right Ear: External ear normal.     Left Ear: External ear normal.     Nose: Nose normal.     Mouth/Throat:  Mouth: Mucous membranes are moist.     Pharynx: Oropharynx is clear.  Eyes:     General: No scleral icterus.       Right eye: No discharge.        Left eye: No discharge.     Extraocular Movements: Extraocular movements intact.     Conjunctiva/sclera: Conjunctivae normal.     Pupils: Pupils are equal, round, and reactive to light.  Neck:     Musculoskeletal: Normal range of motion and neck supple.  Cardiovascular:     Rate and Rhythm: Normal rate and regular rhythm.     Pulses: Normal pulses.     Heart sounds: Normal heart sounds. No murmur. No friction rub. No gallop.   Pulmonary:     Effort: Pulmonary effort is normal. No respiratory distress.     Breath sounds: Normal breath sounds. No stridor. No wheezing, rhonchi or rales.  Chest:     Chest wall: No tenderness.  Abdominal:     General: Abdomen is flat. Bowel sounds are normal. There is no distension.     Palpations: Abdomen is soft.  There is no mass.     Tenderness: There is no abdominal tenderness. There is no right CVA tenderness, left CVA tenderness, guarding or rebound.     Hernia: No hernia is present.  Musculoskeletal: Normal range of motion.  Skin:    General: Skin is warm and dry.     Capillary Refill: Capillary refill takes less than 2 seconds.     Coloration: Skin is not jaundiced or pale.     Findings: No bruising, erythema, lesion or rash.  Neurological:     General: No focal deficit present.     Mental Status: She is alert and oriented to person, place, and time. Mental status is at baseline.  Psychiatric:        Mood and Affect: Mood normal.        Behavior: Behavior normal.        Thought Content: Thought content normal.        Judgment: Judgment normal.     Results for orders placed or performed during the hospital encounter of 08/21/18  Blood culture (routine x 2)  Result Value Ref Range   Specimen Description BLOOD LEFT ANTECUBITAL    Special Requests      BOTTLES DRAWN AEROBIC AND ANAEROBIC Blood Culture adequate volume   Culture      NO GROWTH 5 DAYS Performed at [facility], [address], [place], [place] [address]    Report Status 08/26/2018 FINAL   Blood culture (routine x 2)  Result Value Ref Range   Specimen Description BLOOD BLOOD LEFT HAND    Special Requests      BOTTLES DRAWN AEROBIC AND ANAEROBIC Blood Culture adequate volume   Culture      NO GROWTH 5 DAYS Performed at [facility], [address] [place], [place] [address]    Report Status 08/26/2018 FINAL   Lipase, blood  Result Value Ref Range   Lipase 25 11 - 51 U/L  Comprehensive metabolic panel  Result Value Ref Range   Sodium 132 (L) 135 - 145 mmol/L   Potassium 5.0 3.5 - 5.1 mmol/L   Chloride 105 98 - 111 mmol/L   CO2 20 (L) 22 - 32 mmol/L   Glucose, Bld 133 (H) 70 - 99 mg/dL   BUN 30 (H) 6 - 20 mg/dL   Creatinine, Ser 2.20 (H) 0.44 - 1.00 mg/dL  Calcium 10.2 8.9 - 10.3 mg/dL    Total Protein 8.2 (H) 6.5 - 8.1 g/dL   Albumin 4.2 3.5 - 5.0 g/dL   AST 14 (L) 15 - 41 U/L   ALT 12 0 - 44 U/L   Alkaline Phosphatase 43 38 - 126 U/L   Total Bilirubin 0.5 0.3 - 1.2 mg/dL   GFR calc non Af Amer 25 (L) >60 mL/min   GFR calc Af Amer 29 (L) >60 mL/min   Anion gap 7 5 - 15  CBC  Result Value Ref Range   WBC TEST WILL BE CREDITED 4.0 - 10.5 K/uL   RBC TEST WILL BE CREDITED 3.87 - 5.11 MIL/uL   Hemoglobin TEST WILL BE CREDITED 12.0 - 15.0 g/dL   HCT TEST WILL BE CREDITED 36.0 - 46.0 %   MCV TEST WILL BE CREDITED 80.0 - 100.0 fL   MCH TEST WILL BE CREDITED 26.0 - 34.0 pg   MCHC TEST WILL BE CREDITED 30.0 - 36.0 g/dL   RDW TEST WILL BE CREDITED 11.5 - 15.5 %   Platelets TEST WILL BE CREDITED 150 - 400 K/uL  Urinalysis, Complete w Microscopic  Result Value Ref Range   Color, Urine STRAW (A) YELLOW   APPearance CLEAR (A) CLEAR   Specific Gravity, Urine 1.009 1.005 - 1.030   pH 6.0 5.0 - 8.0   Glucose, UA NEGATIVE NEGATIVE mg/dL   Hgb urine dipstick SMALL (A) NEGATIVE   Bilirubin Urine NEGATIVE NEGATIVE   Ketones, ur NEGATIVE NEGATIVE mg/dL   Protein, ur NEGATIVE NEGATIVE mg/dL   Nitrite NEGATIVE NEGATIVE   Leukocytes, UA NEGATIVE NEGATIVE   RBC / HPF 0-5 0 - 5 RBC/hpf   WBC, UA 0-5 0 - 5 WBC/hpf   Bacteria, UA RARE (A) NONE SEEN   Squamous Epithelial / LPF 0-5 0 - 5  Troponin I - Add-On to previous collection  Result Value Ref Range   Troponin I <0.03 <0.03 ng/mL  CBC with Differential/Platelet  Result Value Ref Range   WBC 16.4 (H) 4.0 - 10.5 K/uL   RBC 3.83 (L) 3.87 - 5.11 MIL/uL   Hemoglobin 11.8 (L) 12.0 - 15.0 g/dL   HCT 37.0 36.0 - 46.0 %   MCV 96.6 80.0 - 100.0 fL   MCH 30.8 26.0 - 34.0 pg   MCHC 31.9 30.0 - 36.0 g/dL   RDW 12.5 11.5 - 15.5 %   Platelets 315 150 - 400 K/uL   nRBC 0.0 0.0 - 0.2 %   Neutrophils Relative % 80 %   Neutro Abs 13.2 (H) 1.7 - 7.7 K/uL   Lymphocytes Relative 10 %   Lymphs Abs 1.6 0.7 - 4.0 K/uL   Monocytes Relative 9 %    Monocytes Absolute 1.4 (H) 0.1 - 1.0 K/uL   Eosinophils Relative 0 %   Eosinophils Absolute 0.1 0.0 - 0.5 K/uL   Basophils Relative 0 %   Basophils Absolute 0.1 0.0 - 0.1 K/uL   Immature Granulocytes 1 %   Abs Immature Granulocytes 0.09 (H) 0.00 - 0.07 K/uL  Basic metabolic panel  Result Value Ref Range   Sodium 133 (L) 135 - 145 mmol/L   Potassium 4.9 3.5 - 5.1 mmol/L   Chloride 107 98 - 111 mmol/L   CO2 19 (L) 22 - 32 mmol/L   Glucose, Bld 126 (H) 70 - 99 mg/dL   BUN 27 (H) 6 - 20 mg/dL   Creatinine, Ser 2.21 (H) 0.44 - 1.00 mg/dL   Calcium  9.4 8.9 - 10.3 mg/dL   GFR calc non Af Amer 25 (L) >60 mL/min   GFR calc Af Amer 29 (L) >60 mL/min   Anion gap 7 5 - 15  CBC  Result Value Ref Range   WBC 15.0 (H) 4.0 - 10.5 K/uL   RBC 3.28 (L) 3.87 - 5.11 MIL/uL   Hemoglobin 10.1 (L) 12.0 - 15.0 g/dL   HCT 31.9 (L) 36.0 - 46.0 %   MCV 97.3 80.0 - 100.0 fL   MCH 30.8 26.0 - 34.0 pg   MCHC 31.7 30.0 - 36.0 g/dL   RDW 12.4 11.5 - 15.5 %   Platelets 254 150 - 400 K/uL   nRBC 0.0 0.0 - 0.2 %  Glucose, capillary  Result Value Ref Range   Glucose-Capillary 85 70 - 99 mg/dL  Glucose, capillary  Result Value Ref Range   Glucose-Capillary 154 (H) 70 - 99 mg/dL  Glucose, capillary  Result Value Ref Range   Glucose-Capillary 186 (H) 70 - 99 mg/dL  Glucose, capillary  Result Value Ref Range   Glucose-Capillary 147 (H) 70 - 99 mg/dL  Basic metabolic panel  Result Value Ref Range   Sodium 136 135 - 145 mmol/L   Potassium 5.0 3.5 - 5.1 mmol/L   Chloride 110 98 - 111 mmol/L   CO2 21 (L) 22 - 32 mmol/L   Glucose, Bld 129 (H) 70 - 99 mg/dL   BUN 28 (H) 6 - 20 mg/dL   Creatinine, Ser 2.36 (H) 0.44 - 1.00 mg/dL   Calcium 9.4 8.9 - 10.3 mg/dL   GFR calc non Af Amer 23 (L) >60 mL/min   GFR calc Af Amer 27 (L) >60 mL/min   Anion gap 5 5 - 15  CBC  Result Value Ref Range   WBC 8.4 4.0 - 10.5 K/uL   RBC 3.21 (L) 3.87 - 5.11 MIL/uL   Hemoglobin 9.8 (L) 12.0 - 15.0 g/dL   HCT 31.3 (L) 36.0 -  46.0 %   MCV 97.5 80.0 - 100.0 fL   MCH 30.5 26.0 - 34.0 pg   MCHC 31.3 30.0 - 36.0 g/dL   RDW 12.3 11.5 - 15.5 %   Platelets 234 150 - 400 K/uL   nRBC 0.0 0.0 - 0.2 %  Magnesium  Result Value Ref Range   Magnesium 2.2 1.7 - 2.4 mg/dL  Glucose, capillary  Result Value Ref Range   Glucose-Capillary 121 (H) 70 - 99 mg/dL  Glucose, capillary  Result Value Ref Range   Glucose-Capillary 145 (H) 70 - 99 mg/dL  Glucose, capillary  Result Value Ref Range   Glucose-Capillary 164 (H) 70 - 99 mg/dL  Glucose, capillary  Result Value Ref Range   Glucose-Capillary 172 (H) 70 - 99 mg/dL  Basic metabolic panel  Result Value Ref Range   Sodium 137 135 - 145 mmol/L   Potassium 5.1 3.5 - 5.1 mmol/L   Chloride 111 98 - 111 mmol/L   CO2 21 (L) 22 - 32 mmol/L   Glucose, Bld 129 (H) 70 - 99 mg/dL   BUN 26 (H) 6 - 20 mg/dL   Creatinine, Ser 2.12 (H) 0.44 - 1.00 mg/dL   Calcium 9.6 8.9 - 10.3 mg/dL   GFR calc non Af Amer 26 (L) >60 mL/min   GFR calc Af Amer 30 (L) >60 mL/min   Anion gap 5 5 - 15  Magnesium  Result Value Ref Range   Magnesium 2.0 1.7 - 2.4 mg/dL  Glucose, capillary  Result Value Ref Range   Glucose-Capillary 122 (H) 70 - 99 mg/dL   Comment 1 Notify RN   Glucose, capillary  Result Value Ref Range   Glucose-Capillary 146 (H) 70 - 99 mg/dL   Comment 1 Notify RN   Glucose, capillary  Result Value Ref Range   Glucose-Capillary 146 (H) 70 - 99 mg/dL   Comment 1 Notify RN       Assessment & Plan:   Problem List Items Addressed This Visit      Endocrine   DM (diabetes mellitus), type 2 with renal complications (HCC)    Given CKD was changed to glipizide from metformin. A1c 6.6 10 days ago. Will recheck A1c in 3 months. Call with any concerns.        Other Visit Diagnoses    Diverticulitis    -  Primary   Doing much better on her abx. Has 6 more days on her cipro/flagyl. Will finish that abx. Call with any concerns. Referred to GI for follow up colonoscopy.   Relevant  Orders   Ambulatory referral to Gastroenterology   CBC with Differential/Platelet   Hyponatremia       Resolved before she left the hospital. Will recheck when she's off her abx. Call with any concerns.    Relevant Orders   Basic metabolic panel       Follow up plan: Return 3 months, for follow up DM.

## 2018-08-26 NOTE — Assessment & Plan Note (Signed)
Given CKD was changed to glipizide from metformin. A1c 6.6 10 days ago. Will recheck A1c in 3 months. Call with any concerns.

## 2018-08-29 ENCOUNTER — Encounter: Payer: Self-pay | Admitting: *Deleted

## 2018-08-31 ENCOUNTER — Encounter: Admit: 2018-08-31 | Discharge: 2018-08-31 | Payer: MEDICARE

## 2018-08-31 ENCOUNTER — Encounter: Admit: 2018-08-31 | Discharge: 2018-08-31 | Payer: MEDICARE | Attending: Nephrology | Primary: Nephrology

## 2018-08-31 DIAGNOSIS — Z940 Kidney transplant status: Principal | ICD-10-CM

## 2018-08-31 DIAGNOSIS — I10 Essential (primary) hypertension: Secondary | ICD-10-CM

## 2018-08-31 DIAGNOSIS — Z79899 Other long term (current) drug therapy: Secondary | ICD-10-CM

## 2018-08-31 DIAGNOSIS — D899 Disorder involving the immune mechanism, unspecified: Secondary | ICD-10-CM

## 2018-08-31 DIAGNOSIS — E872 Acidosis: Secondary | ICD-10-CM

## 2018-08-31 DIAGNOSIS — N1330 Unspecified hydronephrosis: Secondary | ICD-10-CM | POA: Diagnosis not present

## 2018-08-31 DIAGNOSIS — C50919 Malignant neoplasm of unspecified site of unspecified female breast: Secondary | ICD-10-CM | POA: Diagnosis not present

## 2018-08-31 DIAGNOSIS — Z7982 Long term (current) use of aspirin: Secondary | ICD-10-CM | POA: Diagnosis not present

## 2018-08-31 DIAGNOSIS — J302 Other seasonal allergic rhinitis: Secondary | ICD-10-CM | POA: Diagnosis not present

## 2018-08-31 DIAGNOSIS — N281 Cyst of kidney, acquired: Secondary | ICD-10-CM | POA: Diagnosis not present

## 2018-08-31 DIAGNOSIS — E1140 Type 2 diabetes mellitus with diabetic neuropathy, unspecified: Secondary | ICD-10-CM | POA: Diagnosis not present

## 2018-08-31 LAB — CBC W/ AUTO DIFF
BASOPHILS ABSOLUTE COUNT: 0 10*9/L (ref 0.0–0.1)
BASOPHILS RELATIVE PERCENT: 0.5 %
EOSINOPHILS ABSOLUTE COUNT: 0.2 10*9/L (ref 0.0–0.4)
EOSINOPHILS RELATIVE PERCENT: 2.2 %
HEMOGLOBIN: 10.7 g/dL — ABNORMAL LOW (ref 12.0–16.0)
LARGE UNSTAINED CELLS: 2 % (ref 0–4)
LYMPHOCYTES RELATIVE PERCENT: 18.8 %
MEAN CORPUSCULAR HEMOGLOBIN CONC: 32.4 g/dL (ref 31.0–37.0)
MEAN CORPUSCULAR HEMOGLOBIN: 31.3 pg (ref 26.0–34.0)
MEAN CORPUSCULAR VOLUME: 96.5 fL (ref 80.0–100.0)
MEAN PLATELET VOLUME: 7.9 fL (ref 7.0–10.0)
MONOCYTES RELATIVE PERCENT: 4.5 %
NEUTROPHILS ABSOLUTE COUNT: 5.5 10*9/L (ref 2.0–7.5)
NEUTROPHILS RELATIVE PERCENT: 71.5 %
PLATELET COUNT: 297 10*9/L (ref 150–440)
RED BLOOD CELL COUNT: 3.41 10*12/L — ABNORMAL LOW (ref 4.00–5.20)
RED CELL DISTRIBUTION WIDTH: 13.2 % (ref 12.0–15.0)

## 2018-08-31 LAB — CREATININE, URINE: Lab: 105.8

## 2018-08-31 LAB — AST (SGOT): Aspartate aminotransferase:CCnc:Pt:Ser/Plas:Qn:: 20

## 2018-08-31 LAB — COMPREHENSIVE METABOLIC PANEL
ALBUMIN: 4.2 g/dL (ref 3.5–5.0)
ALKALINE PHOSPHATASE: 44 U/L (ref 38–126)
ALT (SGPT): 14 U/L (ref <35)
ANION GAP: 14 mmol/L (ref 7–15)
BILIRUBIN TOTAL: 0.2 mg/dL (ref 0.0–1.2)
BLOOD UREA NITROGEN: 37 mg/dL — ABNORMAL HIGH (ref 7–21)
CALCIUM: 10.9 mg/dL — ABNORMAL HIGH (ref 8.5–10.2)
CHLORIDE: 106 mmol/L (ref 98–107)
CO2: 19 mmol/L — ABNORMAL LOW (ref 22.0–30.0)
CREATININE: 2.39 mg/dL — ABNORMAL HIGH (ref 0.60–1.00)
EGFR CKD-EPI AA FEMALE: 26 mL/min/{1.73_m2} — ABNORMAL LOW (ref >=60)
EGFR CKD-EPI NON-AA FEMALE: 23 mL/min/{1.73_m2} — ABNORMAL LOW (ref >=60)
GLUCOSE RANDOM: 121 mg/dL (ref 70–179)
POTASSIUM: 5.1 mmol/L — ABNORMAL HIGH (ref 3.5–5.0)
PROTEIN TOTAL: 7.6 g/dL (ref 6.5–8.3)
SODIUM: 139 mmol/L (ref 135–145)

## 2018-08-31 LAB — PROTEIN / CREATININE RATIO, URINE
CREATININE, URINE: 105.8 mg/dL
PROTEIN URINE: <4 mg/dL

## 2018-08-31 LAB — MAGNESIUM
MAGNESIUM: 1.7 mg/dL (ref 1.6–2.2)
Magnesium:MCnc:Pt:Ser/Plas:Qn:: 1.7

## 2018-08-31 LAB — LIPID PANEL
CHOLESTEROL/HDL RATIO SCREEN: 4.9 (ref <5.0)
HDL CHOLESTEROL: 27 mg/dL — ABNORMAL LOW (ref 40–59)
LDL CHOLESTEROL CALCULATED: 27 mg/dL — ABNORMAL LOW (ref 60–99)
NON-HDL CHOLESTEROL: 106 mg/dL
VLDL CHOLESTEROL CAL: 78.8 mg/dL — ABNORMAL HIGH (ref 11–40)

## 2018-08-31 LAB — TACROLIMUS, TROUGH: Lab: 13.1

## 2018-08-31 LAB — EOSINOPHILS RELATIVE PERCENT: Lab: 2.2

## 2018-08-31 LAB — CALCIUM: Calcium:MCnc:Pt:Ser/Plas:Qn:: 10.9 — ABNORMAL HIGH

## 2018-08-31 LAB — PHOSPHORUS: Phosphate:MCnc:Pt:Ser/Plas:Qn:: 4.4

## 2018-08-31 LAB — HEMOGLOBIN A1C
HEMOGLOBIN A1C: 6.8 % — ABNORMAL HIGH (ref 4.8–5.6)
Hemoglobin A1c/Hemoglobin.total:MFr:Pt:Bld:Qn:: 6.8 — ABNORMAL HIGH

## 2018-08-31 LAB — NON-HDL CHOLESTEROL: Cholesterol.non HDL:MCnc:Pt:Ser/Plas:Qn:: 106

## 2018-08-31 MED ORDER — GLIPIZIDE 5 MG TABLET
ORAL_TABLET | Freq: Every day | ORAL | 11 refills | 0.00000 days | Status: CP
Start: 2018-08-31 — End: 2019-08-31

## 2018-08-31 MED ORDER — ATORVASTATIN 40 MG TABLET
ORAL_TABLET | Freq: Every day | ORAL | 11 refills | 0 days | Status: CP
Start: 2018-08-31 — End: 2019-08-31

## 2018-08-31 MED FILL — MYFORTIC 180 MG TABLET,DELAYED RELEASE: 30 days supply | Qty: 180 | Fill #5

## 2018-08-31 MED FILL — MYFORTIC 180 MG TABLET,DELAYED RELEASE: 30 days supply | Qty: 180 | Fill #5 | Status: AC

## 2018-08-31 MED FILL — PROGRAF 1 MG CAPSULE: 30 days supply | Qty: 240 | Fill #5 | Status: AC

## 2018-08-31 MED FILL — PROGRAF 1 MG CAPSULE: 30 days supply | Qty: 240 | Fill #5

## 2018-08-31 NOTE — Unmapped (Signed)
Transplant Nephrology Clinic Visit      History of Present Illness    Patient is a 53 y.o. 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  She was on dialysis approximately 7 years prior to this transplant, the donor kidney had a KDPI of 65%.  She did have issues with hyperkalemia while on dialysis, and was on Kayexalate weekly.  Her post transplant course has been without rejection or recurrent disease. Patient does have evidence of a donor specific antibody to DP01 at an MFI of 2294 (stable) on 07/08/2016. Her most recent creatinine has ranged between 1.6 and 2.4.    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     In December 2016, she underwent routine mammogram that was abnormal. Follow-up mammogram in January was again highly suggestive of significant lesion in the left breast. She underwent core biopsy on 09/09/2015 which showed ductal carcinoma in situ. She subsequently underwent a partial mastectomy on 09/24/2015. 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 That final pathology showed a positive medial margin, so she underwent reexcision on 10/25/2015 for which the pathology was negative. Her surgical management was felt to be complete.     The patient was hospitalized in [place] in the beginning of January for 2-3 days with sinus congestion, shortness of breath on exertion, and scant hemoptysis. She was diagnosed with URI and treated with Levaquin. Her symptoms have improved.    Interval history since last seen on 02/16/2018    Since last visit, patient was admitted from 1/6 through 08/09/18 for chest pain. Serial troponins negative, echo unremarkable with EF 65-70%.     She was again admitted 1/19 through 08/24/18, after presenting with several days of left lower abdominal pain. CT notable for diverticulitis with leukocytosis on labs. She was treated with IV ciprofloxacin and Flagyl during admission. Discharged home on 8 day course of PO cipro and flagyl. Creatinine remained stable during admission.     Patient returns to clinic for follow up today. She has been feeling well since discharge from hospital. Abdominal pain significantly improved. Metformin discontinued and she was started on Glipizide. Blood sugars have been stable, reporting fasting sugars of 85-95 in the morning. No recurrent chest pain since emergency room visit, attributes previous episode to stress of several deaths in the family at the time.     She is scheduled for regular mammogram screening and oncology follow up visit in June 2020. 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 She is happy with that decision.      Review of Systems    Otherwise on review of systems patient denies fever or chills, chest pain, SOB, PND or orthopnea, lower extremity edema.  Denies abdominal pain.  No dysuria, hematuria or difficulty voiding. Bowel movements normal.  Denies joint pain or rash.  All other systems are reviewed and are negative.    Medications    Current Outpatient Medications   Medication Sig Dispense Refill   ??? acetaminophen (TYLENOL) 500 MG tablet Take 1,000 mg by mouth every four (4) hours as needed.      ??? aspirin 81 MG chewable tablet Chew 1 tablet (81 mg total) daily. 30 tablet 11   ??? atorvastatin (LIPITOR) 40 MG tablet Take 1 tablet (40 mg total) by mouth daily. 30 tablet 11   ??? carvedilol (COREG) 12.5  MG tablet Take 1 tablet (12.5 mg total) by mouth Two (2) times a day. 60 tablet 11   ??? cetirizine (ZYRTEC) 5 MG chewable tablet Chew 5 mg daily.     ??? cholecalciferol, vitamin D3, (VITAMIN D3) 1,000 unit capsule Take 1,000 Units by mouth daily.     ??? fluticasone (FLONASE) 50 mcg/actuation nasal spray Two sprays each nare twice a day. 16 g 5   ??? gabapentin (NEURONTIN) 300 MG capsule Take 1 capsules (300 mg) by mouth in the morning and at noon, 600 mg in the evening. (Patient taking differently: Take 300 mg by mouth Three (3) times a day. Take 1 capsules (300 mg) by mouth in the morning and at noon, 600 mg in the evening.) 360 capsule 11   ??? glipiZIDE (GLUCOTROL) 5 MG tablet Take 1 tablet (5 mg total) by mouth daily. 30 tablet 11   ??? levETIRAcetam (KEPPRA) 500 MG tablet Take 1 tablet (500 mg total) by mouth Two (2) times a day. 60 tablet 11   ??? magnesium oxide (MAG-OX) 400 mg (241.3 mg magnesium) tablet Take 400 mg by mouth daily.     ??? miscellaneous medical supply Misc 1 knee brace 1 each 0   ??? MYFORTIC 180 mg EC tablet TAKE 2 TABLETS (360 MG TOTAL) BY MOUTH THREE (3) TIMES A DAY. 180 tablet 11   ??? omeprazole (PRILOSEC) 20 MG capsule Take 1 capsule (20 mg total) by mouth two (2) times a day. 180 capsule 3   ??? oxyCODONE-acetaminophen (PERCOCET) 5-325 mg per tablet Take 1 tablet by mouth every four (4) hours as needed for pain. 20 tablet 0   ??? PROGRAF 1 mg capsule TAKE 4 CAPSULES (4MG ) BY MOUTH TWICE DAILY (IN THE MORNING AND IN THE EVENING) 240 capsule 11   ??? sodium bicarbonate 650 mg tablet Take 2 tablets (1300 mg) three times a day. 180 tablet 11   ??? tamoxifen (NOLVADEX) 20 MG tablet Take 1 tablet (20 mg total) by mouth daily. 90 tablet 4   ??? traMADol (ULTRAM) 50 mg tablet Take 1 tablet (50 mg total) by mouth every eight (8) hours as needed for pain. 60 tablet 0   ??? valACYclovir (VALTREX) 500 MG tablet Take 500 mg by mouth daily as needed.       No current facility-administered medications for this visit.        Physical Exam  BP 96/66  - Pulse 88  - Temp 36.3 ??C (97.4 ??F) (Temporal)  - Ht 160 cm (5' 2.99)  - Wt 90.8 kg (200 lb 3.2 oz)  - LMP 09/04/2015  - BMI 35.47 kg/m??   General: Patient is a pleasant female in no apparent distress.  Eyes: Sclera anicteric.  Mouth: No obvious erythema or abscess.  ENT: Oropharynx without lesions.   Neck: Supple without LAD/JVD/bruits.  Lungs: Clear to auscultation bilaterally, no wheezes/rales/rhonchi.  Cardiovascular: Regular rate and rhythm without murmurs, rubs or gallops.  Abdomen: Soft, notender/nondistended. Positive bowel sounds. No tenderness over the graft.    Extremities: Without edema, joints without evidence of synovitis. Right forearm AVF.  Skin: Without rash.  Neurological: Grossly nonfocal.  Psychiatric: Mood and affect appropriate.    Laboratory Results    Recent Results (from the past 170 hour(s))   Phosphorus Level    Collection Time: 08/31/18  1:13 PM   Result Value Ref Range    Phosphorus 4.4 2.9 - 4.7 mg/dL   Magnesium Level    Collection Time: 08/31/18  1:13 PM  Result Value Ref Range    Magnesium 1.7 1.6 - 2.2 mg/dL   Comprehensive Metabolic Panel    Collection Time: 08/31/18  1:13 PM   Result Value Ref Range    Sodium 139 135 - 145 mmol/L    Potassium 5.1 (H) 3.5 - 5.0 mmol/L    Chloride 106 98 - 107 mmol/L    Anion Gap 14 7 - 15 mmol/L    CO2 19.0 (L) 22.0 - 30.0 mmol/L    BUN 37 (H) 7 - 21 mg/dL    Creatinine 2.44 (H) 0.60 - 1.00 mg/dL    BUN/Creatinine Ratio 15     EGFR CKD-EPI Non-African American, Female 23 (L) >=60 mL/min/1.12m2    EGFR CKD-EPI African American, Female 26 (L) >=60 mL/min/1.72m2    Glucose 121 70 - 179 mg/dL    Calcium 01.0 (H) 8.5 - 10.2 mg/dL    Albumin 4.2 3.5 - 5.0 g/dL    Total Protein 7.6 6.5 - 8.3 g/dL    Total Bilirubin 0.2 0.0 - 1.2 mg/dL    AST 20 14 - 38 U/L    ALT 14 <35 U/L    Alkaline Phosphatase 44 38 - 126 U/L   CMV DNA, quantitative, PCR    Collection Time: 08/31/18  1:13 PM   Result Value Ref Range    CMV Viral Ld Not Detected Not Detected    CMV Quant      CMV Quant Log10      CMV Comment     Tacrolimus Level, Trough    Collection Time: 08/31/18  1:13 PM   Result Value Ref Range    Tacrolimus, Trough 13.1 5.0 - 15.0 ng/mL   Hemoglobin A1c    Collection Time: 08/31/18  1:13 PM   Result Value Ref Range    Hemoglobin A1C 6.8 (H) 4.8 - 5.6 %    Estimated Average Glucose 148 mg/dL   Lipid panel    Collection Time: 08/31/18  1:13 PM   Result Value Ref Range    Triglycerides 394 (H) 1 - 149 mg/dL    Cholesterol 272 536 - 199 mg/dL    HDL 27 (L) 40 - 59 mg/dL    LDL Calculated 27 (L) 60 - 99 mg/dL    VLDL Cholesterol Cal 78.8 (H) 11 - 40 mg/dL    Chol/HDL Ratio 4.9 <6.4    Non-HDL Cholesterol 106 mg/dL    FASTING No    Vitamin D 25 Hydroxy (25OH D2 + D3)    Collection Time: 08/31/18  1:13 PM   Result Value Ref Range    Vitamin D Total (25OH) 31.8 20.0 - 80.0 ng/mL   Parathyroid Hormone (PTH)    Collection Time: 08/31/18  1:13 PM   Result Value Ref Range    PTH 68.9 12.0 - 72.0 pg/mL    Calcium 10.9 (H) 8.5 - 10.2 mg/dL   EBV Viral Load  (Epstein-Barr Virus DNA)    Collection Time: 08/31/18  1:13 PM   Result Value Ref Range    EBV Viral Load Result Not Detected Not Detected   CBC w/ Differential    Collection Time: 08/31/18  1:13 PM   Result Value Ref Range    WBC 7.7 4.5 - 11.0 10*9/L    RBC 3.41 (L) 4.00 - 5.20 10*12/L    HGB 10.7 (L) 12.0 - 16.0 g/dL    HCT 40.3 (L) 47.4 - 46.0 %    MCV 96.5 80.0 - 100.0 fL  MCH 31.3 26.0 - 34.0 pg    MCHC 32.4 31.0 - 37.0 g/dL    RDW 53.6 64.4 - 03.4 %    MPV 7.9 7.0 - 10.0 fL    Platelet 297 150 - 440 10*9/L    Neutrophils % 71.5 %    Lymphocytes % 18.8 %    Monocytes % 4.5 %    Eosinophils % 2.2 %    Basophils % 0.5 %    Absolute Neutrophils 5.5 2.0 - 7.5 10*9/L    Absolute Lymphocytes 1.5 1.5 - 5.0 10*9/L    Absolute Monocytes 0.4 0.2 - 0.8 10*9/L    Absolute Eosinophils 0.2 0.0 - 0.4 10*9/L    Absolute Basophils 0.0 0.0 - 0.1 10*9/L    Large Unstained Cells 2 0 - 4 %    Macrocytosis Slight (A) Not Present    Hypochromasia Slight (A) Not Present   Cytology-Urine    Collection Time: 08/31/18  2:49 PM   Result Value Ref Range    Case Report       Non-gynecologic Cytology                          Case: [id number]                                 Authorizing Provider:  Posey Boyer Morry Veiga, MD       Collected:           08/31/2018 1449              Ordering Location:     [facility] TRANSPLANT SURGERY    Received:            09/01/2018 [id number]                                                                  Pathologist:           Jerelyn Scott, MD                                                      Specimen:    Urine clean catch, voided decoy                                                            Final Diagnosis       Urine, voided  -No malignant cells identified  -No decoy cells identified  -Fungal elements morphologically compatible with Candida spp.  -Predominantly squamous cells    This electronic signature is attestation that the pathologist personally reviewed the submitted material(s) and the final diagnosis reflects that evaluation.      Clinical History       Urine for Decoy cells, s/p Kidney Transplant      Gross Description  Urine clean catch voided decoy    40mL clear, yellow fluid, unfixed    Materials Prepared & Examined    Smear Slides.......0  Monolayers..........1  Cytospins.............0  Cell Blocks...........0  Core Biopsy.........0  Touch Prep..........0          Microscopic Description       Microscopic examination substantiates the above diagnosis.    Resident Physician: None Assigned      EMBEDDED IMAGES      Specimen Adequacy Satisfactory for evaluation     Disclaimer       Unless otherwise specified, specimens are preserved using 10% neutral buffered formalin. For cases in which immunohistochemical and/or in-situ hybridization stains are performed, the following statement applies: Appropriate controls for each stain (positive controls with or without negative controls) have been evaluated and stain as expected. These stains have not been separately validated for use on decalcified specimens and should be interpreted with caution in that setting. Some of the reagents used for these stains may be classified as analyte specific reagents (ASR). Tests using ASRs were developed, and their performance characteristics were determined, by the Anatomic Pathology Department [facility]). They have not been cleared or approved by the [place] Food and Drug Administration (FDA). The FDA does not require these tests to go through premarket FDA review. These tests are used for clinical purposes. They should not be regarded as investigational or for research. This laboratory is certified under the Clinical Laboratory Improvement Amendments (CLIA) as qualified to perform high complexity clinical laboratory testing.     Protein/Creatinine Ratio, Urine    Collection Time: 08/31/18  2:49 PM   Result Value Ref Range    Creat U 105.8 Undefined mg/dL    Protein, Ur <1.6 Undefined mg/dL    Protein/Creatinine Ratio, Urine     Urine Culture    Collection Time: 08/31/18  2:49 PM   Result Value Ref Range    Urine Culture, Comprehensive NO GROWTH          Assessment and Plan    1. Status post renal transplant.  Her creatinine is at the upper limit of her baseline at 2.39. Patient took her prograf this morning prior to getting labs drawn. Therefore, tac trough of 13.1 is not accurate. She will repeat locally. Continue current immunosuppression for now, will adjust if needed based on a good trough.    2. Breast cancer status post resection. On tamoxifen and tolerating that well. Scheduled for regular oncology follow up and mammography screening in June.     3. History of hyperkalemia. Potassium only mildly elevated at 5.1 today. She has been educated on low potassium diet. Would not treat unless > 6.    4. Seasonal allergies. On Flonase nasal spay, instructed to take Claritin or Zyrtec if needed.     5. Metabolic acidosis. HCO3 only mildly decreased at 19, currently on supplementation.     6. History of seizure disorder. Patient denies seizure activity and continues on Keppra.     7. Hypertension. Blood pressure controlled on current regimen; on lower side today in clinic at 96/66. Patient asymptomatic. She will contact our clinic if blood pressure is running consistently low or she develops any dizziness/lightheadedness.     8. Chronic lower back and leg pain. Stable.    9. Diabetes mellitus. Hgb A1c 6.9 in January 2019. Managed elsewhere. Metformin was discontinued during recent admission 08/2018 and she was transitioned to Glipizide 5 mg bid.     10. Elevated LDL  of 103 in 08/2017. Continues to trend down, 27 on labs today. Not currently on a statin. Will continue to follow closely.     11. Acute diverticulitis with leukocytosis. Abdominal pain resolved. Scheduled to complete course of PO Cipro and Flagyl tomorrow (1/30).     12. Health maintenance. She received a flu shot this fall. Getting routine mammograms to follow up breast cancer. Unfortunately, we do not have shingrix vaccine in clinic today, encouraged her to received locally.     13. Will see patient back in 6 months, or sooner if needed. Encouraged more regular monthly labs.       Scribe's Attestation: Lisbeth Ply, MD obtained and performed the history, physical exam and medical decision making elements that were entered into the chart.  Signed by Jerl Santos, Scribe, on August 31, 2018 at 2:39 PM.    ----------------------------------------------------------------------------------------------------------------------  September 03, 2018 11:39 AM. Documentation assistance provided by the Scribe. I was present during the time the encounter was recorded. The information recorded by the Scribe was done at my direction and has been reviewed and validated by me.  ----------------------------------------------------------------------------------------------------------------------

## 2018-09-01 LAB — VITAMIN D, TOTAL (25OH): Lab: 31.8

## 2018-09-01 LAB — CMV DNA, QUANTITATIVE, PCR: CMV VIRAL LD: NOT DETECTED

## 2018-09-01 LAB — EBV VIRAL LOAD RESULT: Lab: NOT DETECTED

## 2018-09-01 LAB — CMV VIRAL LD: Lab: NOT DETECTED

## 2018-09-01 NOTE — Unmapped (Signed)
Prograf level from 1/29 was not a trough level. Pt. Took her Prograf prior to labs being drawn.

## 2018-09-06 ENCOUNTER — Other Ambulatory Visit: Payer: Medicare Other

## 2018-09-07 LAB — VITAMIN D 1,25-DIHYDROXY: 1,25-Dihydroxyvitamin D:MCnc:Pt:Ser/Plas:Qn:: 16 — ABNORMAL LOW

## 2018-09-13 LAB — HLA DS POST TRANSPLANT
ANTI-DONOR DRW #1 MFI: 252 MFI
ANTI-DONOR HLA-A #1 MFI: 35 MFI
ANTI-DONOR HLA-A #2 MFI: 15 MFI
ANTI-DONOR HLA-B #1 MFI: 155 MFI
ANTI-DONOR HLA-B #2 MFI: 146 MFI
ANTI-DONOR HLA-C #1 MFI: 190 MFI
ANTI-DONOR HLA-C #2 MFI: 179 MFI
ANTI-DONOR HLA-DP #2 MFI: 810 MFI — ABNORMAL HIGH
ANTI-DONOR HLA-DQB #1 MFI: 291 MFI
ANTI-DONOR HLA-DR #1 MFI: 286 MFI
ANTI-DONOR HLA-DR #2 MFI: 162 MFI

## 2018-09-13 LAB — HLA CL2 AB COMMENT: Lab: 0

## 2018-09-13 LAB — DONOR DRW ANTIGEN #1

## 2018-09-13 LAB — HLA CL1 ANTIBODY COMM: Lab: 0

## 2018-09-20 ENCOUNTER — Encounter: Payer: Medicare Other | Admitting: Family Medicine

## 2018-09-21 NOTE — Unmapped (Signed)
Patient does not need a refill of specialty medication at this time. Moving specialty refill reminder call to appropriate date and removed call attempt data.  Spoke with patient and she states she does not need any refills at this time. Patient states she just received her last shipment of medications (Myfortic & Prograf) Yesterday and she is going to start taking them today, so she still has a month worth of medication on hand. Patient confirmed she has not missed any doses.

## 2018-09-22 NOTE — Unmapped (Signed)
We received package back Tuesday. Called pt to send out on Wednesday same day.

## 2018-09-28 ENCOUNTER — Ambulatory Visit (INDEPENDENT_AMBULATORY_CARE_PROVIDER_SITE_OTHER): Payer: Medicare Other | Admitting: Gastroenterology

## 2018-09-28 ENCOUNTER — Encounter: Payer: Self-pay | Admitting: Gastroenterology

## 2018-09-28 ENCOUNTER — Other Ambulatory Visit: Payer: Self-pay

## 2018-09-28 VITALS — BP 115/75 | HR 88 | Ht 65.0 in | Wt 202.6 lb

## 2018-09-28 DIAGNOSIS — K5732 Diverticulitis of large intestine without perforation or abscess without bleeding: Secondary | ICD-10-CM

## 2018-09-28 NOTE — Progress Notes (Signed)
[name] [address]  [place], [place] [address]  Main: [phone]  Fax: [phone]   Gastroenterology Consultation  Referring Provider:     Valerie Roys, DO Primary Care Physician:  Valerie Roys, DO  Reason for Consultation:     Diverticulitis        HPI:   Chief complaint: Diverticulitis  [name] is a 53 y.o. y/o female referred for consultation & management  by Dr. Wynetta Emery, Megan P, DO.  Patient hospitalized in August 31, 2018 due to diverticulitis which resolved with antibiotics. patient has completed her oral course of antibiotics as well. The patient denies abdominal or flank pain, anorexia, nausea or vomiting, dysphagia, change in bowel habits or black or bloody stools or weight loss.  Reports previous history of a colonoscopy prior to her kidney transplant.  We do not have the report or results from this.  According to the patient was normal.  Past Medical History:  Diagnosis Date  . Arrhythmia   . Atypical chest pain 08/09/2018  . Cerebrovascular accident [facility]) 2010   without any focal neurological deficits.   . Clotted renal dialysis AV graft ([place])   . Compression of vein 08/16/2018  . 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 Diabetes mellitus without complication ([place])   . Ductal carcinoma in situ (DCIS) of left breast   . End stage renal disease ([place])    a. previously on dialysis on Tues, Thurs, & 10/19/22 x 7 yrs; b. s/p deceased donor transplant 04/02/2014   . GERD (gastroesophageal reflux disease)   . History of methicillin resistant Staphylococcus aureus infection   . History of stress test    a. 04/2013: normal, EF 65%  . HIT (heparin-induced thrombocytopenia) ([place])   . Hypertension   . Mitral regurgitation    a. 01/2014: EF >55%, LVH, mild MR, dilated LA  . Renal transplant, status post April 02, 2014  . RLS (restless legs syndrome)   . Seizure disorder ([place])   . Seizures ([place])   . Sepsis ([place]) 08/09/2016  .  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 INSERTION OF DIALYSIS CATHETER     x 2  . KIDNEY TRANSPLANT  03/2014   right side.  Marland Kitchen MASTECTOMY PARTIAL / LUMPECTOMY Left 09/24/2015  . MASTECTOMY PARTIAL / LUMPECTOMY Left 10/25/2015    Prior to Admission medications   Medication Sig Start Date End Date Taking? Authorizing Provider  aspirin EC 81 MG tablet Take 81 mg by mouth daily.    [provider]  atorvastatin (LIPITOR) 40 MG tablet Take 1 tablet (40 mg total) by mouth daily. 08/16/18   Johnson, Megan P, DO  Blood Glucose Monitoring Suppl (ONE TOUCH ULTRA 2) w/Device KIT  08/25/18   [provider]  carvedilol (COREG) 12.5 MG tablet Take 12.5 mg by mouth 2 (two) times daily with a meal.  08/06/16   [provider]  cetirizine (ZYRTEC) 10 MG tablet Take 1 tablet (10 mg total) by mouth daily as needed for allergies or rhinitis. 08/16/18   Johnson, Megan P, DO  cholecalciferol (VITAMIN D) 25 MCG (1000 UT) tablet Take 1,000 Units by mouth daily.    [provider]  docusate sodium (COLACE) 100 MG capsule Take 100 mg by mouth daily.    [provider]  fluticasone (FLONASE) 50 MCG/ACT nasal spray Two sprays each nare twice a day. Patient taking differently: Place  2 sprays into both nostrils daily. . 12/06/17   Johnson, Megan P, DO  gabapentin (NEURONTIN) 300 MG capsule Take 600 mg by mouth 3 (three) times daily.     [provider]  glipiZIDE (GLUCOTROL) 5 MG tablet Take 1 tablet (5 mg total) by mouth 2 (two) times daily for 30 days. 08/24/18 09/23/18  Otila Back, MD  HYDROcodone-acetaminophen (NORCO/VICODIN) 5-325 MG tablet Take 1 tablet by mouth 3 (three) times daily as needed for moderate pain or severe pain. 08/24/18   Otila Back, MD  Lancets (ONETOUCH ULTRASOFT) lancets  08/25/18   [provider]  levETIRAcetam (KEPPRA) 500 MG tablet Take 500 mg by mouth 2 (two) times  daily.  11/09/16   [provider]  magnesium oxide (MAG-OX) 400 MG tablet Take 400 mg by mouth daily.     [provider]  nitroGLYCERIN (NITROSTAT) 0.4 MG SL tablet Place 1 tablet (0.4 mg total) under the tongue every 5 (five) minutes x 3 doses as needed for chest pain. 08/09/18   Demetrios Loll, MD  omeprazole (PRILOSEC) 20 MG capsule Take 1 capsule (20 mg total) by mouth daily as needed (GERD or heartburn). 08/16/18   Park Liter P, DO  ONE TOUCH ULTRA TEST test strip  08/25/18   [provider]  sodium bicarbonate 650 MG tablet Take 2 tablets (1300 mg) three times a day. 04/11/14   [provider]  tacrolimus (PROGRAF) 1 MG capsule Take 4 mg by mouth 2 (two) times daily.  06/06/14   [provider]  tamoxifen (NOLVADEX) 20 MG tablet Take 20 mg by mouth daily. 12/07/16   [provider]  valACYclovir (VALTREX) 1000 MG tablet Take 1 tablet (1,000 mg total) by mouth 2 (two) times daily. 06/27/18   Valerie Roys, DO    Family History  Problem Relation Age of Onset  . Heart attack Mother   . Hypertension Mother   . Hyperlipidemia Mother   . Cancer Mother        breast  . Heart attack Father   . Hypertension Father   . Hyperlipidemia Father   . Kidney failure Sister        half sister  . Diabetes Maternal Grandmother   . Hearing loss Maternal Grandmother   . Heart failure Maternal Grandmother   . Diabetes Maternal Grandfather   . Stroke Maternal Grandfather   . Cancer Paternal Grandmother        breast  . Stroke Paternal Grandfather   . Aneurysm Brother        Brain  . Other Sister        Nerve problems     Social History   Tobacco Use  . Smoking status: Former Smoker    Last attempt to quit: 11/25/2008    Years since quitting: 9.8  . Smokeless tobacco: Never Used  Substance Use Topics  . Alcohol use: Yes    Alcohol/week: 2.0 standard drinks    Types: 2 Glasses of wine per week  . Drug use: No    Types: "Crack" cocaine     Comment: quit in 2003    Allergies as of 09/28/2018 - Review Complete 08/26/2018  Allergen Reaction Noted  . Heparin  12/08/2012  . Penicillin g  12/09/2012  . Ibuprofen  11/15/2013  . Other  03/27/2013  . Penicillins  11/15/2013    Review of Systems:    All systems reviewed and negative except where noted in HPI.   Physical Exam:  Vitals:   09/28/18 1506  BP: 115/75  Pulse: 88  Weight: 202 lb 9.6 oz (91.9 kg)  Height: '5\' 5"'$  (1.651 m)    Patient's last menstrual period was 03/03/2016 (approximate). Psych:  Alert and cooperative. Normal mood and affect. General:   Alert,  Well-developed, well-nourished, pleasant and cooperative in NAD Head:  Normocephalic and atraumatic. Eyes:  Sclera clear, no icterus.   Conjunctiva pink. Ears:  Normal auditory acuity. Nose:  No deformity, discharge, or lesions. Mouth:  No deformity or lesions,oropharynx pink & moist. Neck:  Supple; no masses or thyromegaly. Abdomen:  Normal bowel sounds.  No bruits.  Soft, non-tender and non-distended without masses, hepatosplenomegaly or hernias noted.  No guarding or rebound tenderness.    Msk:  Symmetrical without gross deformities. Good, equal movement & strength bilaterally. Pulses:  Normal pulses noted. Extremities:  No clubbing or edema.  No cyanosis. Neurologic:  Alert and oriented x3;  grossly normal neurologically. Skin:  Intact without significant lesions or rashes. No jaundice. Lymph Nodes:  No significant cervical adenopathy. Psych:  Alert and cooperative. Normal mood and affect.   Labs: CBC    Component Value Date/Time   WBC 8.4 08/23/2018 0404   RBC 3.21 (L) 08/23/2018 0404   HGB 9.8 (L) 08/23/2018 0404   HGB 11.4 08/16/2018 1510   HCT 31.3 (L) 08/23/2018 0404   HCT 34.6 08/16/2018 1510   PLT 234 08/23/2018 0404   PLT 302 08/16/2018 1510   MCV 97.5 08/23/2018 0404   MCV 94 08/16/2018 1510   MCV 89 11/19/2014 0838   MCH 30.5 08/23/2018 0404   MCHC 31.3 08/23/2018 0404   RDW  12.3 08/23/2018 0404   RDW 12.2 08/16/2018 1510   RDW 16.1 (H) 11/19/2014 0838   LYMPHSABS 1.6 08/21/2018 1636   LYMPHSABS 1.5 08/16/2018 1510   LYMPHSABS 1.3 11/19/2014 0838   MONOABS 1.4 (H) 08/21/2018 1636   MONOABS 0.5 11/19/2014 0838   EOSABS 0.1 08/21/2018 1636   EOSABS 0.2 08/16/2018 1510   EOSABS 0.1 11/19/2014 0838   BASOSABS 0.1 08/21/2018 1636   BASOSABS 0.0 08/16/2018 1510   BASOSABS 0.1 11/19/2014 0838   CMP     Component Value Date/Time   NA 137 08/24/2018 0346   NA 136 08/16/2018 1510   NA 135 11/19/2014 0838   K 5.1 08/24/2018 0346   K 5.0 11/19/2014 0838   CL 111 08/24/2018 0346   CL 106 11/19/2014 0838   CO2 21 (L) 08/24/2018 0346   CO2 23 11/19/2014 0838   GLUCOSE 129 (H) 08/24/2018 0346   GLUCOSE 106 (H) 11/19/2014 0838   BUN 26 (H) 08/24/2018 0346   BUN 32 (H) 08/16/2018 1510   BUN 25 (H) 11/19/2014 0838   CREATININE 2.12 (H) 08/24/2018 0346   CREATININE 1.87 (H) 11/19/2014 0838   CALCIUM 9.6 08/24/2018 0346   CALCIUM 9.7 11/19/2014 0838   PROT 8.2 (H) 08/21/2018 1636   PROT 7.2 08/16/2018 1510   PROT 7.9 06/04/2014 0845   ALBUMIN 4.2 08/21/2018 1636   ALBUMIN 4.4 08/16/2018 1510   ALBUMIN 3.9 06/04/2014 0845   AST 14 (L) 08/21/2018 1636   AST 16 06/04/2014 0845   ALT 12 08/21/2018 1636   ALT 19 06/04/2014 0845   ALKPHOS 43 08/21/2018 1636   ALKPHOS 190 (H) 06/04/2014 0845   BILITOT 0.5 08/21/2018 1636   BILITOT <0.2 08/16/2018 1510   BILITOT 0.3 06/04/2014 0845   GFRNONAA 26 (L) 08/24/2018 0346   GFRNONAA 31 (L) 11/19/2014 7893  GFRAA 30 (L) 08/24/2018 0346   GFRAA 36 (L) 11/19/2014 4739    Imaging Studies: No results found.  Assessment and Plan:   ANAPAULA SEVERT is a 53 y.o. y/o female has been referred for diverticulitis  We discussed with the patient that after an episode of diverticulitis, colonoscopy is indicated to rule out any underlying lesions that led to the diverticulitis However, the likelihood of colon malignancy  is low We do not have the reports of her previous colonoscopy, but she had a kidney transplant about 5 years ago and thinks she had a colonoscopy prior to that.  As far she knows it was normal.  I have discussed alternative options, risks & benefits,  which include, but are not limited to, bleeding, infection, perforation,respiratory complication & drug reaction.  The patient agrees with this plan & written consent will be obtained.    Labs are otherwise reassuring with normal liver enzymes recently Primary care provider has ordered repeat CBC which is pending.  She was noted to have normocytic anemia during her hospitalization, without active GI bleeding.  This is likely improved since her episode of diverticulitis.  We will follow-up once pending labs are available that the primary care provider has ordered.  Dr [name]  Speech recognition software was used to dictate the above note.

## 2018-10-03 ENCOUNTER — Other Ambulatory Visit: Payer: Self-pay

## 2018-10-03 ENCOUNTER — Ambulatory Visit
Admission: RE | Admit: 2018-10-03 | Discharge: 2018-10-03 | Disposition: A | Discharge status: Home / Self Care | Payer: Medicare Other | Source: Ambulatory Visit | Attending: Radiation Oncology | Admitting: Radiation Oncology

## 2018-10-03 ENCOUNTER — Encounter: Payer: Self-pay | Admitting: Radiation Oncology

## 2018-10-03 VITALS — BP 146/88 | HR 80 | Temp 97.8°F | Resp 20 | Wt 205.9 lb

## 2018-10-03 DIAGNOSIS — Z79810 Long term (current) use of selective estrogen receptor modulators (SERMs): Secondary | ICD-10-CM | POA: Diagnosis not present

## 2018-10-03 DIAGNOSIS — D0512 Intraductal carcinoma in situ of left breast: Secondary | ICD-10-CM

## 2018-10-03 DIAGNOSIS — Z923 Personal history of irradiation: Secondary | ICD-10-CM | POA: Insufficient documentation

## 2018-10-03 DIAGNOSIS — Z170 Estrogen receptor positive status [ER+]: Secondary | ICD-10-CM | POA: Diagnosis not present

## 2018-10-03 NOTE — Progress Notes (Signed)
Radiation Oncology Follow up Note  Name: [name]   Date:   10/03/2018 MRN:  [id number] DOB: 07/08/1966    This 53 y.o. female presents to the clinic today for 2-1/2 year follow-up status post whole breast radiation to her left breast for ductal carcinoma in situ.  REFERRING PROVIDER: Valerie Roys, DO  HPI: patient is a 53 year old female now out 2-1/2 years having completed whole breast radiation to her left breast for ductal carcinoma in situ ER/PR positive. Seen today in routine follow up she is doing well. She specifically denies breast tenderness cough or bone pain..she is currently on tamoxifen tolerating that well without side effect.her last mammograms which I have reviewed were back in June 2019 were BI-RADS 2 benign  COMPLICATIONS OF TREATMENT: none  FOLLOW UP COMPLIANCE: keeps appointments   PHYSICAL EXAM:  BP (!) 146/88   Pulse 80   Temp 97.8 F (36.6 C)   Resp 20   Wt 205 lb 14.6 oz (93.4 kg)   LMP 03/03/2016 (Approximate)   BMI 34.27 kg/m  Lungs are clear to A&P cardiac examination essentially unremarkable with regular rate and rhythm. No dominant mass or nodularity is noted in either breast in 2 positions examined. Incision is well-healed. No axillary or supraclavicular adenopathy is appreciated. Cosmetic result is excellent.Well-developed well-nourished patient in NAD. HEENT reveals PERLA, EOMI, discs not visualized.  Oral cavity is clear. No oral mucosal lesions are identified. Neck is clear without evidence of cervical or supraclavicular adenopathy. Lungs are clear to A&P. Cardiac examination is essentially unremarkable with regular rate and rhythm without murmur rub or thrill. Abdomen is benign with no organomegaly or masses noted. Motor sensory and DTR levels are equal and symmetric in the upper and lower extremities. Cranial nerves II through XII are grossly intact. Proprioception is intact. No peripheral adenopathy or edema is identified. No motor or sensory  levels are noted. Crude visual fields are within normal range.  RADIOLOGY RESULTS: mammograms reviewed compatible with the above-stated findings  PLAN: present time patient is doing well with no evidence of disease. I am please were overall progress. She continues on tamoxifen without side effect. I have asked to see her back in 1 year for follow-up. Patient knows to call sooner with any concerns at any time.  I would like to take this opportunity to thank you for allowing me to participate in the care of your patient.Noreene Filbert, MD

## 2018-10-05 DIAGNOSIS — E119 Type 2 diabetes mellitus without complications: Secondary | ICD-10-CM | POA: Diagnosis not present

## 2018-10-05 LAB — HM DIABETES EYE EXAM

## 2018-10-13 DIAGNOSIS — Z940 Kidney transplant status: Principal | ICD-10-CM

## 2018-10-13 NOTE — Unmapped (Signed)
[facility] Clinical Assessment & Refill Coordination Note    Nancy Bowman, DOB: 05/12/1966  Phone: [phone] (home)     All above HIPAA information was verified with patient.     Specialty Medication(s):   Transplant: Myfortic 180mg  and Prograf 1mg      Current Outpatient Medications   Medication Sig Dispense Refill   ??? acetaminophen (TYLENOL) 500 MG tablet Take 1,000 mg by mouth every four (4) hours as needed.      ??? aspirin 81 MG chewable tablet Chew 1 tablet (81 mg total) daily. 30 tablet 11   ??? atorvastatin (LIPITOR) 40 MG tablet Take 1 tablet (40 mg total) by mouth daily. 30 tablet 11   ??? carvedilol (COREG) 12.5 MG tablet Take 1 tablet (12.5 mg total) by mouth Two (2) times a day. 60 tablet 11   ??? cetirizine (ZYRTEC) 5 MG chewable tablet Chew 5 mg daily.     ??? cholecalciferol, vitamin D3, (VITAMIN D3) 1,000 unit capsule Take 1,000 Units by mouth daily.     ??? fluticasone (FLONASE) 50 mcg/actuation nasal spray Two sprays each nare twice a day. 16 g 5   ??? gabapentin (NEURONTIN) 300 MG capsule Take 1 capsules (300 mg) by mouth in the morning and at noon, 600 mg in the evening. (Patient taking differently: Take 300 mg by mouth Three (3) times a day. Take 1 capsules (300 mg) by mouth in the morning and at noon, 600 mg in the evening.) 360 capsule 11   ??? glipiZIDE (GLUCOTROL) 5 MG tablet Take 1 tablet (5 mg total) by mouth daily. 30 tablet 11   ??? levETIRAcetam (KEPPRA) 500 MG tablet Take 1 tablet (500 mg total) by mouth Two (2) times a day. 60 tablet 11   ??? magnesium oxide (MAG-OX) 400 mg (241.3 mg magnesium) tablet Take 400 mg by mouth daily.     ??? miscellaneous medical supply Misc 1 knee brace 1 each 0   ??? MYFORTIC 180 mg EC tablet TAKE 2 TABLETS (360 MG TOTAL) BY MOUTH THREE (3) TIMES A DAY. 180 tablet 11   ??? omeprazole (PRILOSEC) 20 MG capsule Take 1 capsule (20 mg total) by mouth two (2) times a day. 180 capsule 3   ??? oxyCODONE-acetaminophen (PERCOCET) 5-325 mg per tablet Take 1 tablet by mouth every four (4) hours as needed for pain. 20 tablet 0   ??? PROGRAF 1 mg capsule TAKE 4 CAPSULES (4MG ) BY MOUTH TWICE DAILY (IN THE MORNING AND IN THE EVENING) 240 capsule 11   ??? sodium bicarbonate 650 mg tablet Take 2 tablets (1300 mg) three times a day. 180 tablet 11   ??? tamoxifen (NOLVADEX) 20 MG tablet Take 1 tablet (20 mg total) by mouth daily. 90 tablet 4   ??? traMADol (ULTRAM) 50 mg tablet Take 1 tablet (50 mg total) by mouth every eight (8) hours as needed for pain. 60 tablet 0   ??? valACYclovir (VALTREX) 500 MG tablet Take 500 mg by mouth daily as needed.       No current facility-administered medications for this visit.         Changes to medications: Kiyla reports no changes reported at this time.    Allergies   Allergen Reactions   ??? 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 Ibuprofen      Can't take due to kidneys   ??? Penicillins      Patient is unsure if she's  allergic, was told she had a reaction as a child but thinks she's taking a cillin while sick without having a reaction.  Pt states she has taken amoxicillin in the past and did not have problem       Changes to allergies: No    SPECIALTY MEDICATION ADHERENCE     Myfortic 180 mg: 5 days of medicine on hand   Prograf 1 mg: 5 days of medicine on hand       Medication Adherence    Adherence tools used:  patient uses a pill box to manage medications          Specialty medication(s) dose(s) confirmed: Regimen is correct and unchanged.     Are there any concerns with adherence? No    Adherence counseling provided? Not needed    CLINICAL MANAGEMENT AND INTERVENTION      Clinical Benefit Assessment:    Do you feel the medicine is effective or helping your condition? Yes    Clinical Benefit counseling provided? Not needed    Adverse Effects Assessment:    Are you experiencing any side effects? No    Are you experiencing difficulty administering your medicine? No    Quality of Life Assessment:    How many days over the past month did your kidney transplant  keep you from your normal activities? For example, brushing your teeth or getting up in the morning. 0    Have you discussed this with your provider? Not needed    Therapy Appropriateness:    Is therapy appropriate? Yes, therapy is appropriate and should be continued    DISEASE/MEDICATION-SPECIFIC INFORMATION      N/A    PATIENT SPECIFIC NEEDS     ? Does the patient have any physical, cognitive, or cultural barriers? No    ? Is the patient high risk? Yes, patient taking a REMS drug     ? Does the patient require a Care Management Plan? No     ? Does the patient require physician intervention or other additional [facility] (i.e. nutrition, smoking cessation, social work)? No      SHIPPING     Specialty Medication(s) to be Shipped:   Transplant: Myfortic 180mg  and Prograf 1mg     Other medication(s) to be shipped: None     Changes to insurance: No    Delivery Scheduled: Yes, Expected medication delivery date: 10/17/2018.     Medication will be delivered via UPS to the confirmed home address in [facility].    The patient will receive a drug information handout for each medication shipped and additional FDA Medication Guides as required.  Verified that patient has previously received a Conservation officer, historic buildings.    Nancy [facility]   [facility] Specialty Pharmacist

## 2018-10-14 MED ORDER — PROGRAF 1 MG CAPSULE
ORAL_CAPSULE | 11 refills | 0 days | Status: CP
Start: 2018-10-14 — End: 2018-12-26
  Filled 2018-10-14: qty 240, 30d supply, fill #0

## 2018-10-14 MED ORDER — MYFORTIC 180 MG TABLET,DELAYED RELEASE
ORAL_TABLET | 11 refills | 0 days | Status: CP
Start: 2018-10-14 — End: 2018-12-26
  Filled 2018-10-14: qty 180, 30d supply, fill #0

## 2018-10-14 MED FILL — PROGRAF 1 MG CAPSULE: 30 days supply | Qty: 240 | Fill #0 | Status: AC

## 2018-10-14 MED FILL — MYFORTIC 180 MG TABLET,DELAYED RELEASE: 30 days supply | Qty: 180 | Fill #0 | Status: AC

## 2018-10-17 ENCOUNTER — Encounter: Payer: Self-pay | Admitting: Family Medicine

## 2018-10-17 ENCOUNTER — Ambulatory Visit (INDEPENDENT_AMBULATORY_CARE_PROVIDER_SITE_OTHER): Payer: Medicare Other | Admitting: Family Medicine

## 2018-10-17 ENCOUNTER — Other Ambulatory Visit: Payer: Self-pay

## 2018-10-17 VITALS — BP 140/92 | HR 74 | Temp 98.6°F | Wt 204.6 lb

## 2018-10-17 DIAGNOSIS — N183 Chronic kidney disease, stage 3 unspecified: Secondary | ICD-10-CM

## 2018-10-17 DIAGNOSIS — E1122 Type 2 diabetes mellitus with diabetic chronic kidney disease: Secondary | ICD-10-CM

## 2018-10-17 DIAGNOSIS — K0889 Other specified disorders of teeth and supporting structures: Secondary | ICD-10-CM | POA: Diagnosis not present

## 2018-10-17 MED ORDER — CLINDAMYCIN HCL 300 MG PO CAPS: 300.0000 mg | ORAL_CAPSULE | Freq: Four times a day (QID) | ORAL | 0 refills | Status: DC

## 2018-10-17 MED ORDER — TRAMADOL HCL 50 MG PO TABS: 50.0000 mg | ORAL_TABLET | Freq: Three times a day (TID) | ORAL | 0 refills | Status: DC | PRN

## 2018-10-17 NOTE — Progress Notes (Signed)
BP (!) 140/92   Pulse 74   Temp 98.6 F (37 C) (Oral)   Wt 204 lb 9.6 oz (92.8 kg)   LMP 03/03/2016 (Approximate)   SpO2 96%   BMI 34.05 kg/m    Subjective:    Patient ID: [name], female    DOB: 02/04/1966, 53 y.o.   MRN: [id number]  HPI: [name] is a 53 y.o. female  Chief Complaint  Patient presents with  . Dental Pain    pt states she has been having tooth pain for abotu a week    DENTAL PAIN Duration: about a week bad, seems to be on and off for 3 weeks Involved teeth: back bottim Dentist evaluation: no Mechanism of injury:  no trauma Onset: sudden Severity: severe Quality: sharp Frequency: constant Radiation: none Aggravating factors: cold, heat, hard foods and chewing Alleviating factors: nothing Status: worse Treatments attempted: nothing Relief with NSAIDs?: No NSAIDs Taken Fevers: no Swelling: yes Redness: no Paresthesias / decreased sensation: no Sinus pressure: no  HYPERLIPIDEMIA- Started on atorvastatin for her DM- did not return for recheck  Hyperlipidemia status: stable Satisfied with current treatment?  yes Side effects:  no Medication compliance: good compliance Past cholesterol meds: atorvastatin Supplements: none Aspirin:  no The 10-year ASCVD risk score Mikey Bussing DC Jr., et al., 2013) is: 20.3%   Values used to calculate the score:     Age: 27 years     Sex: Female     Is Non-Hispanic African American: Yes     Diabetic: Yes     Tobacco smoker: No     Systolic Blood Pressure: 859 mmHg     Is BP treated: Yes     HDL Cholesterol: 27 mg/dL     Total Cholesterol: 133 mg/dL Chest pain:  no   Relevant past medical, surgical, family and social history reviewed and updated as indicated. Interim medical history since our last visit reviewed. Allergies and medications reviewed and updated.  Review of Systems  Constitutional: Negative.   Respiratory: Negative.   Cardiovascular: Negative.   Neurological: Negative.    Psychiatric/Behavioral: Negative.     Per HPI unless specifically indicated above     Objective:    BP (!) 140/92   Pulse 74   Temp 98.6 F (37 C) (Oral)   Wt 204 lb 9.6 oz (92.8 kg)   LMP 03/03/2016 (Approximate)   SpO2 96%   BMI 34.05 kg/m   Wt Readings from Last 3 Encounters:  10/17/18 204 lb 9.6 oz (92.8 kg)  10/03/18 205 lb 14.6 oz (93.4 kg)  09/28/18 202 lb 9.6 oz (91.9 kg)    Physical Exam Vitals signs and nursing note reviewed.  Constitutional:      General: She is not in acute distress.    Appearance: Normal appearance. She is not ill-appearing, toxic-appearing or diaphoretic.  HENT:     Head: Normocephalic and atraumatic.     Right Ear: External ear normal.     Left Ear: External ear normal.     Nose: Nose normal.     Mouth/Throat:     Mouth: Mucous membranes are moist.     Pharynx: Oropharynx is clear.  Eyes:     General: No scleral icterus.       Right eye: No discharge.        Left eye: No discharge.     Extraocular Movements: Extraocular movements intact.     Conjunctiva/sclera: Conjunctivae normal.     Pupils: Pupils are  equal, round, and reactive to light.  Neck:     Musculoskeletal: Normal range of motion and neck supple.  Cardiovascular:     Rate and Rhythm: Normal rate and regular rhythm.     Pulses: Normal pulses.     Heart sounds: Normal heart sounds. No murmur. No friction rub. No gallop.   Pulmonary:     Effort: Pulmonary effort is normal. No respiratory distress.     Breath sounds: Normal breath sounds. No stridor. No wheezing, rhonchi or rales.  Chest:     Chest wall: No tenderness.  Musculoskeletal: Normal range of motion.  Skin:    General: Skin is warm and dry.     Capillary Refill: Capillary refill takes less than 2 seconds.     Coloration: Skin is not jaundiced or pale.     Findings: No bruising, erythema, lesion or rash.  Neurological:     General: No focal deficit present.     Mental Status: She is alert and oriented to  person, place, and time. Mental status is at baseline.  Psychiatric:        Mood and Affect: Mood normal.        Behavior: Behavior normal.        Thought Content: Thought content normal.        Judgment: Judgment normal.     Results for orders placed or performed in visit on 10/12/18  HM DIABETES EYE EXAM  Result Value Ref Range   HM Diabetic Eye Exam No Retinopathy No Retinopathy      Assessment & Plan:   Problem List Items Addressed This Visit      Endocrine   DM (diabetes mellitus), type 2 with renal complications ([place])    Rechecking lipids and CMP today. Await results. Call with any concerns.       Relevant Orders   Comprehensive metabolic panel   Lipid Panel w/o Chol/HDL Ratio    Other Visit Diagnoses    Pain, dental    -  Primary   Will treat with clindamycin and tramadol. She will call her dentist. Call with any concerns.       Follow up plan: Return in about 4 weeks (around 11/14/2018) for Physical/DM follow up.

## 2018-10-17 NOTE — Assessment & Plan Note (Signed)
Rechecking lipids and CMP today. Await results. Call with any concerns.

## 2018-10-18 LAB — COMPREHENSIVE METABOLIC PANEL
ALT: 12 IU/L (ref 0–32)
AST: 13 IU/L (ref 0–40)
Albumin/Globulin Ratio: 1.6 (ref 1.2–2.2)
Albumin: 4.5 g/dL (ref 3.8–4.9)
Alkaline Phosphatase: 64 IU/L (ref 39–117)
BUN/Creatinine Ratio: 14 (ref 9–23)
BUN: 29 mg/dL — ABNORMAL HIGH (ref 6–24)
Bilirubin Total: 0.3 mg/dL (ref 0.0–1.2)
CALCIUM: 10.2 mg/dL (ref 8.7–10.2)
CO2: 18 mmol/L — ABNORMAL LOW (ref 20–29)
Chloride: 104 mmol/L (ref 96–106)
Creatinine, Ser: 2.05 mg/dL — ABNORMAL HIGH (ref 0.57–1.00)
GFR calc Af Amer: 32 mL/min/{1.73_m2} — ABNORMAL LOW (ref >59)
GFR calc non Af Amer: 27 mL/min/{1.73_m2} — ABNORMAL LOW (ref >59)
Globulin, Total: 2.9 g/dL (ref 1.5–4.5)
Glucose: 147 mg/dL — ABNORMAL HIGH (ref 65–99)
Potassium: 5.6 mmol/L — ABNORMAL HIGH (ref 3.5–5.2)
Sodium: 138 mmol/L (ref 134–144)
Total Protein: 7.4 g/dL (ref 6.0–8.5)

## 2018-10-18 LAB — LIPID PANEL W/O CHOL/HDL RATIO
Cholesterol, Total: 171 mg/dL (ref 100–199)
HDL: 34 mg/dL — ABNORMAL LOW (ref >39)
Triglycerides: 446 mg/dL — ABNORMAL HIGH (ref 0–149)

## 2018-10-20 ENCOUNTER — Telehealth: Payer: Self-pay

## 2018-10-20 ENCOUNTER — Encounter: Payer: Self-pay | Admitting: Family Medicine

## 2018-10-20 NOTE — Telephone Encounter (Signed)
Patient has been made aware her colonoscopy with Dr. Darene Lamer scheduled for 03/26 has been canceled due to [place] [address] and we will contact her to reschedule at a later time.  She said [facility] records [facility].  Informed her that we would try to obtain.  Thanks [employer]

## 2018-10-21 ENCOUNTER — Ambulatory Visit (INDEPENDENT_AMBULATORY_CARE_PROVIDER_SITE_OTHER): Payer: Medicare Other | Admitting: Family Medicine

## 2018-10-21 ENCOUNTER — Ambulatory Visit: Payer: Self-pay | Admitting: Family Medicine

## 2018-10-21 ENCOUNTER — Other Ambulatory Visit: Payer: Self-pay

## 2018-10-21 ENCOUNTER — Encounter: Payer: Self-pay | Admitting: Family Medicine

## 2018-10-21 ENCOUNTER — Ambulatory Visit: Payer: Self-pay

## 2018-10-21 VITALS — BP 133/87 | HR 73 | Temp 98.7°F

## 2018-10-21 DIAGNOSIS — J069 Acute upper respiratory infection, unspecified: Secondary | ICD-10-CM

## 2018-10-21 NOTE — Progress Notes (Signed)
BP 133/87   Pulse 73   Temp 98.7 F (37.1 C) (Oral)   LMP 03/03/2016 (Approximate)   SpO2 99%    Subjective:    Patient ID: [name], female    DOB: 1966-04-12, 53 y.o.   MRN: [id number]  HPI: [name] is a 53 y.o. female  Chief Complaint  Patient presents with  . URI    pt states she has been having congestion, drainage, and runny nose    Here today with several days of rhinorrhea, sinus pressure that now since this morning her mucus has turned thick and dark. Also having a more productive cough since this morning. Does have known significant seasonal allergies for which she is compliant with her medications and sinus rinse routine. Currently on clindamycin for a dental issue that she just started a few days ago. Denies fever, chills, body aches, CP, SOB, wheezing, N/V/D. No known sick contacts.   Relevant past medical, surgical, family and social history reviewed and updated as indicated. Interim medical history since our last visit reviewed. Allergies and medications reviewed and updated.  Review of Systems  Per HPI unless specifically indicated above     Objective:    BP 133/87   Pulse 73   Temp 98.7 F (37.1 C) (Oral)   LMP 03/03/2016 (Approximate)   SpO2 99%   Wt Readings from Last 3 Encounters:  10/17/18 204 lb 9.6 oz (92.8 kg)  10/03/18 205 lb 14.6 oz (93.4 kg)  09/28/18 202 lb 9.6 oz (91.9 kg)    Physical Exam Vitals signs and nursing note reviewed.  Constitutional:      Appearance: Normal appearance.  HENT:     Head: Atraumatic.     Right Ear: Tympanic membrane and external ear normal.     Left Ear: Tympanic membrane and external ear normal.     Nose: Congestion present.     Mouth/Throat:     Mouth: Mucous membranes are moist.     Pharynx: Posterior oropharyngeal erythema present.  Eyes:     Extraocular Movements: Extraocular movements intact.     Conjunctiva/sclera: Conjunctivae normal.  Neck:     Musculoskeletal: Normal range of  motion and neck supple.  Cardiovascular:     Rate and Rhythm: Normal rate and regular rhythm.     Heart sounds: Normal heart sounds.  Pulmonary:     Effort: Pulmonary effort is normal.     Breath sounds: Normal breath sounds. No wheezing.  Musculoskeletal: Normal range of motion.  Skin:    General: Skin is warm and dry.  Neurological:     Mental Status: She is alert and oriented to person, place, and time.  Psychiatric:        Mood and Affect: Mood normal.        Thought Content: Thought content normal.     Results for orders placed or performed in visit on 10/17/18  Comprehensive metabolic panel  Result Value Ref Range   Glucose 147 (H) 65 - 99 mg/dL   BUN 29 (H) 6 - 24 mg/dL   Creatinine, Ser 2.05 (H) 0.57 - 1.00 mg/dL   GFR calc non Af Amer 27 (L) >59 mL/min/1.73   GFR calc Af Amer 32 (L) >59 mL/min/1.73   BUN/Creatinine Ratio 14 9 - 23   Sodium 138 134 - 144 mmol/L   Potassium 5.6 (H) 3.5 - 5.2 mmol/L   Chloride 104 96 - 106 mmol/L   CO2 18 (L) 20 - 29 mmol/L  Calcium 10.2 8.7 - 10.2 mg/dL   Total Protein 7.4 6.0 - 8.5 g/dL   Albumin 4.5 3.8 - 4.9 g/dL   Globulin, Total 2.9 1.5 - 4.5 g/dL   Albumin/Globulin Ratio 1.6 1.2 - 2.2   Bilirubin Total 0.3 0.0 - 1.2 mg/dL   Alkaline Phosphatase 64 39 - 117 IU/L   AST 13 0 - 40 IU/L   ALT 12 0 - 32 IU/L  Lipid Panel w/o Chol/HDL Ratio  Result Value Ref Range   Cholesterol, Total 171 100 - 199 mg/dL   Triglycerides 446 (H) 0 - 149 mg/dL   HDL 34 (L) >39 mg/dL   VLDL Cholesterol Cal Comment 5 - 40 mg/dL   LDL Calculated Comment 0 - 99 mg/dL      Assessment & Plan:   Problem List Items Addressed This Visit    None    Visit Diagnoses    Upper respiratory tract infection, unspecified type    -  Primary   likely viral vs allergic, but discussed that her abx should cover a bacterial component should it be present. Continue current regimen and supportive care       Follow up plan: Return if symptoms worsen or fail to  improve.

## 2018-10-21 NOTE — Telephone Encounter (Signed)
Called and left patient a VM letting her know what Dr. Wynetta Emery said. Asked patient to call back with any questions or concerns.

## 2018-10-21 NOTE — Telephone Encounter (Signed)
She returned a call from where she had spoke with another [facility] triage nurse.    Dr. Park Liter has responded.   I read the pt the message from Dr. Wynetta Emery dated 10/21/2018 at 12:46 regarding the antibiotic.  After reading this to pt she decided to come in and be seen since she is not better.    Her voice is very hoarse.  I made her an appt with Merrie Roof, PA for today at 3:30.

## 2018-10-21 NOTE — Telephone Encounter (Signed)
Incoming call from ,Patient who states that she has a lot of sinus yellow gold drainage.  Report temps of 98.4, 97.9 Patient request an Rx be called into [facility].  Patient request a return phone call  [phone].  Patient is a [employer].    Reason for Disposition . [1] Sinus congestion as part of a cold AND [2] present < 10 days  Answer Assessment - Initial Assessment Questions 1. LOCATION: "Where does it hurt?"      No pain 2. ONSET: "When did the sinus pain start?"  (e.g., hours, days)      No pain 3. SEVERITY: "How bad is the pain?"   (Scale 1-10; mild, moderate or severe)   - MILD (1-3): doesn't interfere with normal activities    - MODERATE (4-7): interferes with normal activities (e.g., work or school) or awakens from sleep   - SEVERE (8-10): excruciating pain and patient unable to do any normal activities        denies 4. RECURRENT SYMPTOM: "Have you ever had sinus problems before?" If so, ask: "When was the last time?" and "What happened that time?"      Yes  5. NASAL CONGESTION: "Is the nose blocked?" If so, ask, "Can you open it or must you breathe through the mouth?"      Congested.   6. NASAL DISCHARGE: "Do you have discharge from your nose?" If so ask, "What color?"     Yellow gold 7. FEVER: "Do you have a fever?" If so, ask: "What is it, how was it measured, and when did it start?"      98.4, 97.9 8. OTHER SYMPTOMS: "Do you have any other symptoms?" (e.g., sore throat, cough, earache, difficulty breathing)    Sore throat just started to hurt, just a little 9. PREGNANCY: "Is there any chance you are pregnant?" "When was your last menstrual period?"     na  Protocols used: SINUS PAIN OR CONGESTION-A-AH

## 2018-10-21 NOTE — Telephone Encounter (Signed)
She is already on an antibiotic for her dental infection- on day 4. That will treat a sinus infection if she has one. She does not need anything else. If she is worse or not getting better, she will need to be seen.

## 2018-10-27 ENCOUNTER — Ambulatory Visit: Admission: RE | Admit: 2018-10-27 | Payer: Medicare Other | Source: Home / Self Care | Admitting: Gastroenterology

## 2018-10-27 ENCOUNTER — Encounter: Admission: RE | Payer: Self-pay | Source: Home / Self Care

## 2018-10-27 SURGERY — COLONOSCOPY WITH PROPOFOL
Anesthesia: General

## 2018-10-31 ENCOUNTER — Other Ambulatory Visit: Payer: Self-pay | Admitting: Family Medicine

## 2018-10-31 IMAGING — DX DG FINGER INDEX 2+V*R*
3 series · 3 of 3 positions shown · non-contrast
Comparison: None.

CLINICAL DATA: Slammed finger in the car door

EXAM:
RIGHT INDEX FINGER 2+V

[finger ap]
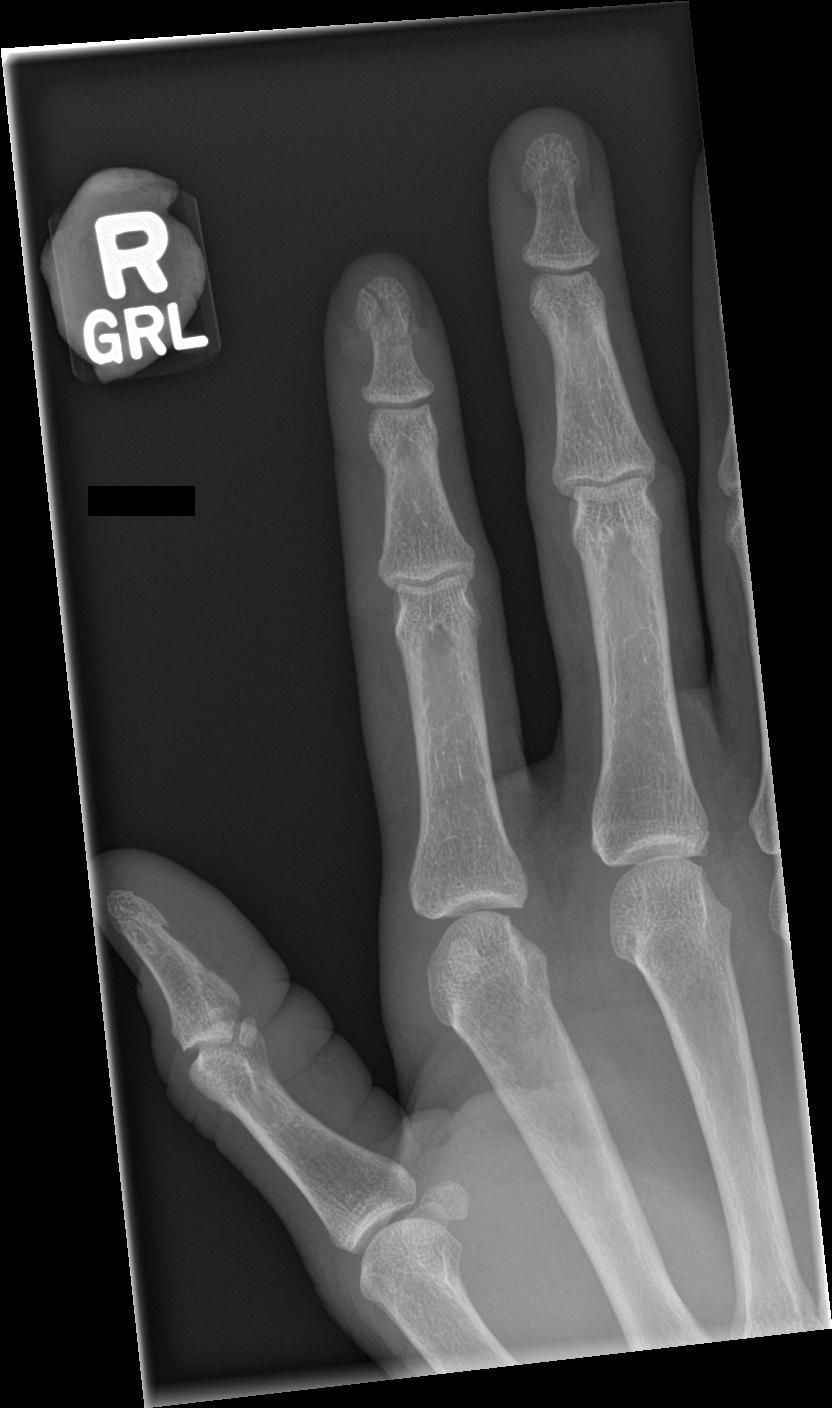

[finger obl]
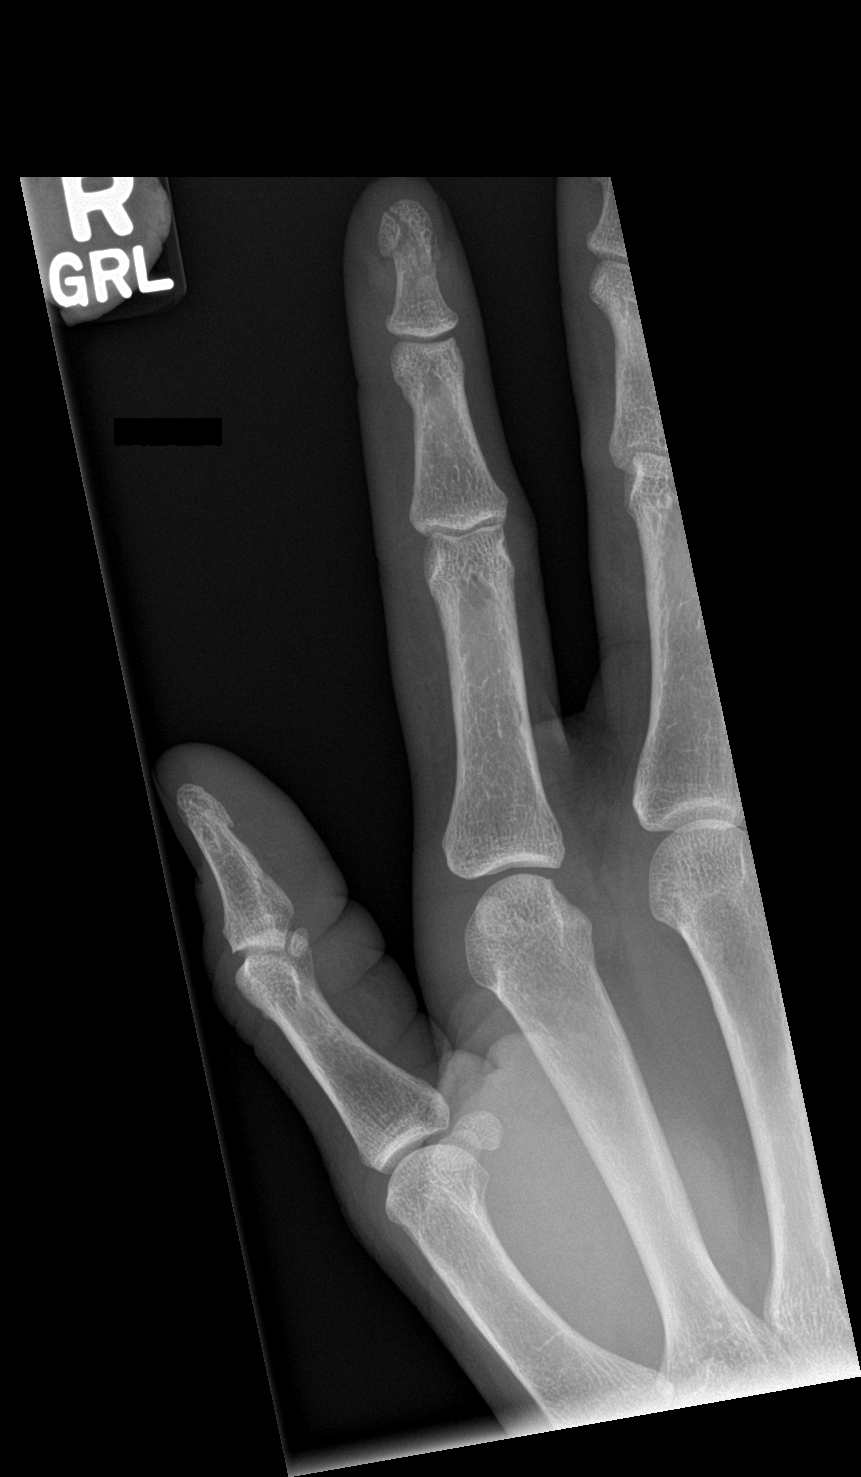

[finger lat]
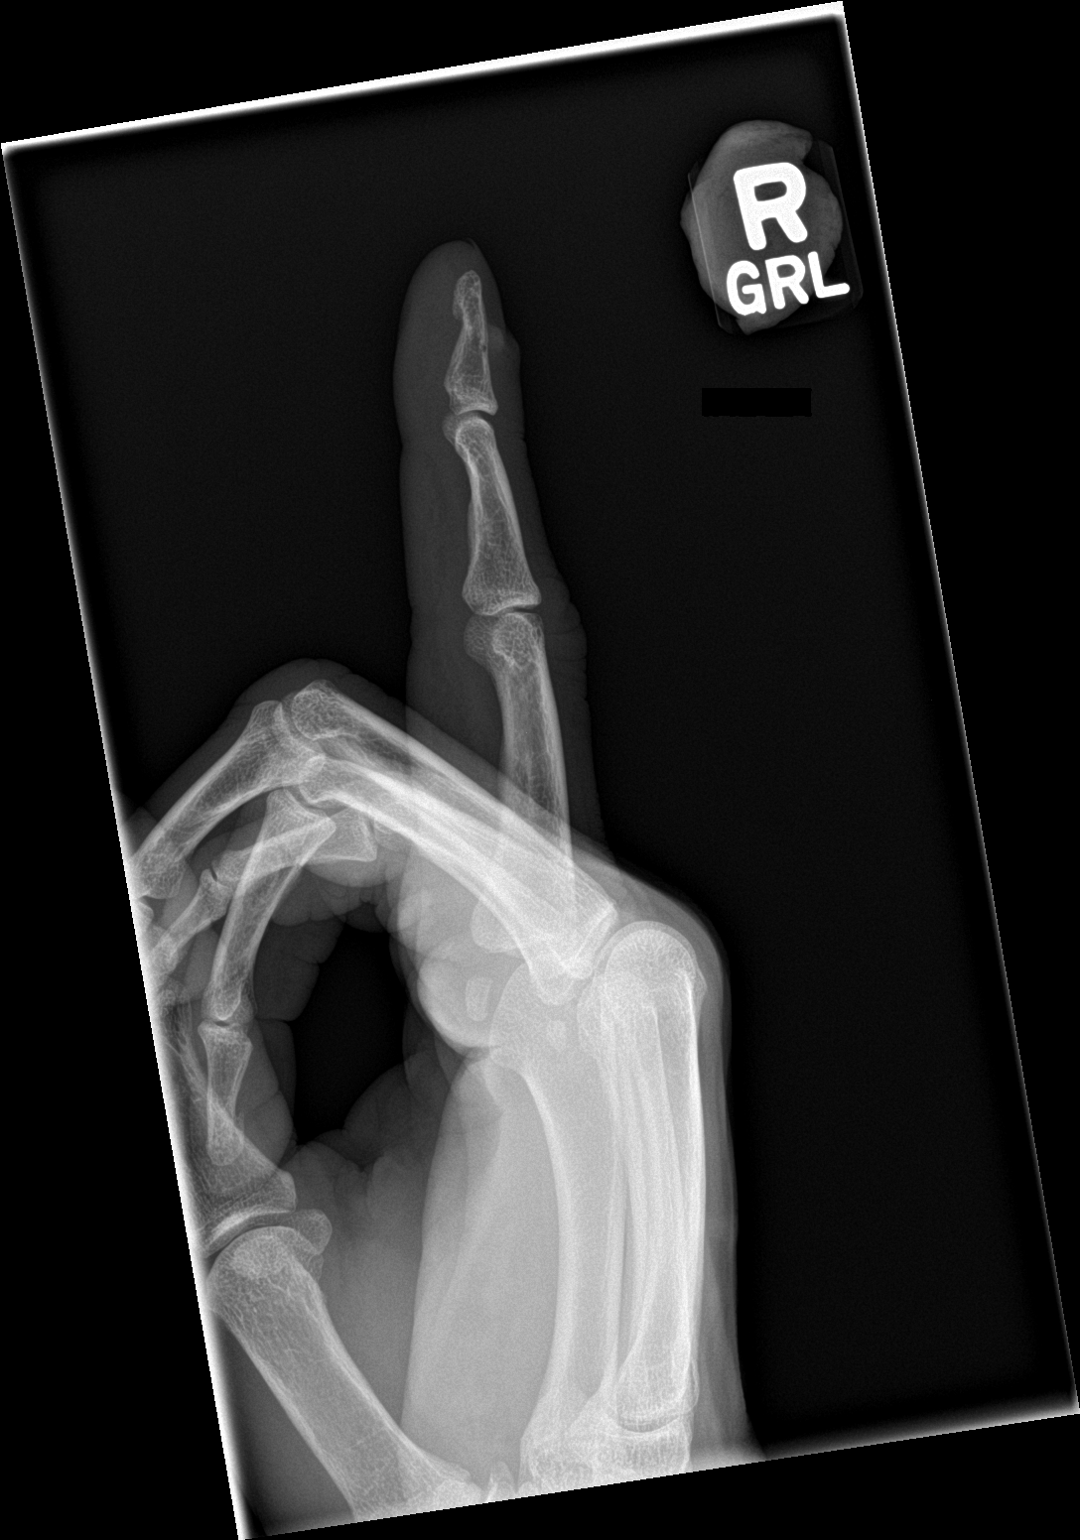

[3 of 3 positions shown; findings below may reference images not displayed]

FINDINGS: Comminuted nondisplaced fracture involving the tuft and distal shaft
of the second distal phalanx. No subluxation. No radiopaque foreign
body.
IMPRESSION: Comminuted nondisplaced fracture involving the tuft and distal shaft
of the second distal phalanx.

## 2018-10-31 MED ORDER — CLINDAMYCIN HCL 300 MG PO CAPS: 300.0000 mg | ORAL_CAPSULE | Freq: Four times a day (QID) | ORAL | 0 refills | Status: DC

## 2018-11-02 ENCOUNTER — Encounter: Admit: 2018-11-02 | Discharge: 2018-11-03 | Payer: MEDICARE | Attending: General Practice | Primary: General Practice

## 2018-11-02 DIAGNOSIS — K047 Periapical abscess without sinus: Secondary | ICD-10-CM

## 2018-11-02 DIAGNOSIS — K029 Dental caries, unspecified: Principal | ICD-10-CM

## 2018-11-02 MED ORDER — ACETAMINOPHEN 300 MG-CODEINE 30 MG TABLET
ORAL_TABLET | Freq: Four times a day (QID) | ORAL | 0 refills | 0.00000 days | Status: CP | PRN
Start: 2018-11-02 — End: 2018-11-06

## 2018-11-02 NOTE — Unmapped (Signed)
[facility]  Limited/Emergency Examination   Dental Extraction        Procedure completed today: limited exam, periapical radiograph #30, and extraction of #30    Subjective     History of Present Illness:    Nancy Bowman is a 53 y.o. female with history of kidney transplant on 03/24/2014, HTN, and DM who presents to the hospital dental clinic with complaint of pain and swelling of the right mandible associated with a tooth.  She reports that the problem has been going on for a year but she has not bee treated due to insurance issues.  She reports that the pain at one point had resolved, therefore, did not follow up until the pain reoccurred and swelling developed recently.  She called her PCP and was prescribed clindamycin for swelling which she has been taking for about one week.  She reports that her swelling has been improving with the antibiotic.      Patient Active Problem List   Diagnosis   ??? Hypertension   ??? ESRD (end stage renal disease) (CMS-HCC)   ??? GERD (gastroesophageal reflux disease)   ??? Seizure disorder (CMS-HCC)   ??? 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 Kidney transplanted   ??? Ductal carcinoma in situ (DCIS) of left breast   ??? Diabetes mellitus type 2 without retinopathy (CMS-HCC)   ??? Hypertensive retinopathy of both eyes   ??? Hx of arterial ischemic stroke   ??? Age-related nuclear cataract of both eyes   ??? Hyperopia with astigmatism and presbyopia, bilateral       Past Medical History:   Diagnosis Date   ??? CVA (cerebral vascular accident) (CMS-HCC) 2010   ??? Diabetes mellitus (CMS-HCC)    ??? Difficult intravenous access    ??? Ductal carcinoma in situ (DCIS) of left breast     left 09/2015   ??? ESRD (end stage renal disease) (CMS-HCC)     HD TuThSat   ??? GERD (gastroesophageal reflux disease) 03/24/2014   ??? 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 Hypertension    ??? Seizure (CMS-HCC) none since 2011    secondary to CVA   ??? Seizure disorder (CMS-HCC) 03/24/2014         Current Outpatient Medications:   ???  acetaminophen (TYLENOL) 500 MG tablet, Take 1,000 mg by mouth every four (4) hours as needed. , Disp: , Rfl:   ???  aspirin 81 MG chewable tablet, Chew 1 tablet (81 mg total) daily., Disp: 30 tablet, Rfl: 11  ???  atorvastatin (LIPITOR) 40 MG tablet, Take 1 tablet (40 mg total) by mouth daily., Disp: 30 tablet, Rfl: 11  ???  carvedilol (COREG) 12.5 MG tablet, Take 1 tablet (12.5 mg total) by mouth Two (2) times a day., Disp: 60 tablet, Rfl: 11  ???  cetirizine (ZYRTEC) 5 MG chewable tablet, Chew 5 mg daily., Disp: , Rfl:   ???  cholecalciferol, vitamin D3, (VITAMIN D3) 1,000 unit capsule, Take 1,000 Units by mouth daily., Disp: , Rfl:   ???  fluticasone (FLONASE) 50 mcg/actuation nasal spray, Two sprays each nare twice a day., Disp: 16 g, Rfl: 5  ???  gabapentin (NEURONTIN) 300 MG capsule, Take 1 capsules (300 mg) by mouth in the morning and at noon, 600 mg in the evening. (Patient taking differently: Take 300 mg by mouth Three (3) times a day. Take 1 capsules (300 mg) by mouth in the morning and at noon, 600 mg in the evening.), Disp:  360 capsule, Rfl: 11  ???  glipiZIDE (GLUCOTROL) 5 MG tablet, Take 1 tablet (5 mg total) by mouth daily., Disp: 30 tablet, Rfl: 11  ???  levETIRAcetam (KEPPRA) 500 MG tablet, Take 1 tablet (500 mg total) by mouth Two (2) times a day., Disp: 60 tablet, Rfl: 11  ???  magnesium oxide (MAG-OX) 400 mg (241.3 mg magnesium) tablet, Take 400 mg by mouth daily., Disp: , Rfl:   ???  miscellaneous medical supply Misc, 1 knee brace, Disp: 1 each, Rfl: 0  ???  MYFORTIC 180 mg EC tablet, TAKE 2 TABLETS (360 MG TOTAL) BY MOUTH THREE (3) TIMES A DAY., Disp: 180 tablet, Rfl: 11  ???  omeprazole (PRILOSEC) 20 MG capsule, Take 1 capsule (20 mg total) by mouth two (2) times a day., Disp: 180 capsule, Rfl: 3  ???  oxyCODONE-acetaminophen (PERCOCET) 5-325 mg per tablet, Take 1 tablet by mouth every four (4) hours as needed for pain., Disp: 20 tablet, Rfl: 0  ???  PROGRAF 1 mg capsule, TAKE 4 CAPSULES (4MG ) BY MOUTH TWICE DAILY (IN THE MORNING AND IN THE EVENING), Disp: 240 capsule, Rfl: 11  ???  sodium bicarbonate 650 mg tablet, Take 2 tablets (1300 mg) three times a day., Disp: 180 tablet, Rfl: 11  ???  tamoxifen (NOLVADEX) 20 MG tablet, Take 1 tablet (20 mg total) by mouth daily., Disp: 90 tablet, Rfl: 4  ???  traMADol (ULTRAM) 50 mg tablet, Take 1 tablet (50 mg total) by mouth every eight (8) hours as needed for pain., Disp: 60 tablet, Rfl: 0  ???  valACYclovir (VALTREX) 500 MG tablet, Take 500 mg by mouth daily as needed., Disp: , Rfl:     Allergies   Allergen Reactions   ??? 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 Ibuprofen      Can't take due to kidneys   ??? Penicillins      Patient is unsure if she's allergic, was told she had a reaction as a child but thinks she's taking a cillin while sick without having a reaction.  Pt states she has taken amoxicillin in the past and did not have problem       Objective     Vitals:    11/02/18 1030   BP: 118/73   BP Site: L Arm   BP Position: Sitting   BP Cuff Size: Medium   Pulse: 77   Temp: 37 ??C (98.6 ??F)   SpO2: 99%       Extraoral exam: facial asymmetry present with very slight swelling in the lower left posterior mandible.  No neck involvement.  No physiological trismus.  Patient able to manage oral secretions.  No dysphagia or dyspnea.  FOM not raised.      Intraoral exam: Parulis evident in the buccal gingiva between #30 and #31.  Pain on palpation of the buccal gingiva in the #30-#31 region.  No lingual involvement.  No gross caries.  Tooth #30 pain on percussion .      Radiographic examination: periapical    ?? Secondary caries on #30 involving the pulp  ?? PARL mesial and distal root of #30    Assessment     Nancy Bowman is a 53 y.o. female with history of kidney transplant on 03/24/2014, HTN, and DM who presents with acute apical abscess of tooth #30.     Plan     Explained treatment options to patient of endodontic and extraction.  Patient  desires extraction.      Procedure       Pre-procedure:  ? Treatment plan:  Extraction of #30  ? Time out completed: verified patient's name, DOB, and tooth/teeth to be extracted.    ? Explained treatment options to patient of extraction  ? Consent reviewed with patient/guardian and signed/phone witnessed consent.    Anesthesia:  ? Topical anesthesia (Benzocaine 20%) applied.   ? Type of Anesthesia used:  2 carpule Lidocaine 2% with epinephrine 1:100,000 via infiltration IAN block, 1 carpule Septocaine 4% with epinephrine 1:100,000 via infiltration and 1 carpule Mepivacaine 3% via IAN block  ? Aspiration negative    Procedure:  Attention turned to #30  Simple extraction:   Soft tissue reflection with woodson and periosteal elevator.  Elevated with s/m/l elevators.  Delivered with cowhorn dental forceps.  Fracture of the buccal plate bone in furcation aspect but still attached to the periosteum.  Did not remove the fracture segment.  Socket curetted and rinsed with copious saline.  No purulence observed. Placed 2 x interrupted suture in area of distal papilla and furcation aspects of #30 to re-approximate the bone using 3-0 chromic gut. Hemostasis achieved with gauze pressure.    Patient tolerated procedure well.  Verbal and written post-op instructions give to patient/caregiver.     Prescription given:  Tylenol 3 (10 tabs) q6h prn pain     Patient was satisfied with today's appointment and left in stable condition. All questions answered.     Provider:Dorsey Charette On Jaynie Collins, DDS, MS  Dental Assistant: Deborha Payment    Next Visit:  Prophy/recall

## 2018-11-08 NOTE — Unmapped (Signed)
[facility] Refill Coordination Note    Specialty Medication(s) to be Shipped:   Transplant: Myfortic 180mg  and Prograf 1mg     Other medication(s) to be shipped: none     Nancy Bowman, DOB: [birth year]  Phone: [phone] (home)       All above HIPAA information was verified with patient.     Completed refill call assessment today to schedule patient's medication shipment from the [name] [phone]).       Specialty medication(s) and dose(s) confirmed: Regimen is correct and unchanged.   Changes to medications: Nancy Bowman reports no changes reported at this time.  Changes to insurance: No  Questions for the pharmacist: No    Confirmed patient received Welcome Packet with first shipment. The patient will receive a drug information handout for each medication shipped and additional FDA Medication Guides as required.       DISEASE/MEDICATION-SPECIFIC INFORMATION        N/A    SPECIALTY MEDICATION ADHERENCE     Medication Adherence    Patient reported X missed doses in the last month:  0  Adherence tools used:  patient uses a pill box to manage medications      Myfortic 180mg : Patient has 5 days worth of tablets on hand.   Prograf 1mg : Patient has 5 days worth of capsules on hand.            SHIPPING     Shipping address confirmed in Epic.     Delivery Scheduled: Yes, Expected medication delivery date: 11/10/18.     Medication will be delivered via UPS to the home address in Epic WAM.    Nancy Bowman   [facility] Specialty Technician

## 2018-11-09 MED FILL — PROGRAF 1 MG CAPSULE: 30 days supply | Qty: 240 | Fill #1

## 2018-11-09 MED FILL — MYFORTIC 180 MG TABLET,DELAYED RELEASE: 30 days supply | Qty: 180 | Fill #1

## 2018-11-09 MED FILL — MYFORTIC 180 MG TABLET,DELAYED RELEASE: 30 days supply | Qty: 180 | Fill #1 | Status: AC

## 2018-11-09 MED FILL — PROGRAF 1 MG CAPSULE: 30 days supply | Qty: 240 | Fill #1 | Status: AC

## 2018-11-30 ENCOUNTER — Telehealth: Payer: Self-pay | Admitting: Family Medicine

## 2018-11-30 NOTE — Telephone Encounter (Signed)
Called pt to set up virtual for tomorrow and reschedule physical no answer, left voicemail.

## 2018-12-01 ENCOUNTER — Encounter: Payer: Medicare Other | Admitting: Family Medicine

## 2018-12-05 NOTE — Unmapped (Signed)
[facility] Refill Coordination Note    Specialty Medication(s) to be Shipped:   Transplant: Myfortic 180mg  and Prograf 1mg     Other medication(s) to be shipped: none     Nancy Bowman, DOB: Sep 03, 1965  Phone: [phone] (home)       All above HIPAA information was verified with patient.     Completed refill call assessment today to schedule patient's medication shipment from the [facility] [phone]).       Specialty medication(s) and dose(s) confirmed: Regimen is correct and unchanged.   Changes to medications: Nancy Bowman reports no changes reported at this time.  Changes to insurance: No  Questions for the pharmacist: No    Confirmed patient received Welcome Packet with first shipment. The patient will receive a drug information handout for each medication shipped and additional FDA Medication Guides as required.       DISEASE/MEDICATION-SPECIFIC INFORMATION        N/A    SPECIALTY MEDICATION ADHERENCE     Medication Adherence    Patient reported X missed doses in the last month:  0  Adherence tools used:  patient uses a pill box to manage medications               Myfortic 180mg : Patient has 10 days worth of tablets on hand.   Prograf 1mg : Patient has 10 days worth of capsules on hand.        SHIPPING     Shipping address confirmed in Epic.     Delivery Scheduled: Yes, Expected medication delivery date: 12/13/18.     Medication will be delivered via UPS to the home address in Epic WAM.    Nancy Bowman   [facility] Specialty Technician

## 2018-12-12 MED FILL — MYFORTIC 180 MG TABLET,DELAYED RELEASE: 30 days supply | Qty: 180 | Fill #2 | Status: AC

## 2018-12-12 MED FILL — PROGRAF 1 MG CAPSULE: 30 days supply | Qty: 240 | Fill #2

## 2018-12-12 MED FILL — MYFORTIC 180 MG TABLET,DELAYED RELEASE: 30 days supply | Qty: 180 | Fill #2

## 2018-12-12 MED FILL — PROGRAF 1 MG CAPSULE: 30 days supply | Qty: 240 | Fill #2 | Status: AC

## 2018-12-13 ENCOUNTER — Telehealth: Payer: Self-pay

## 2018-12-13 NOTE — Telephone Encounter (Signed)
LVM for pt to call office to schedule.  Thanks [employer]

## 2018-12-13 NOTE — Telephone Encounter (Signed)
-----   Message from Virgel Manifold, MD sent at 12/12/2018 12:32 PM EDT ----- Schedule colonoscopy for hx of diverticulitis for a Tuesday in [place]

## 2018-12-21 ENCOUNTER — Other Ambulatory Visit: Payer: Self-pay | Admitting: Family Medicine

## 2018-12-26 MED ORDER — TACROLIMUS 1 MG CAPSULE
ORAL_CAPSULE | Freq: Two times a day (BID) | ORAL | 11 refills | 30 days | Status: CP
Start: 2018-12-26 — End: 2019-12-26
  Filled 2019-01-05: qty 240, 30d supply, fill #0

## 2018-12-26 MED ORDER — MYCOPHENOLATE SODIUM 180 MG TABLET,DELAYED RELEASE
ORAL_TABLET | Freq: Three times a day (TID) | ORAL | 11 refills | 30 days | Status: CP
Start: 2018-12-26 — End: 2019-12-26
  Filled 2019-01-05: qty 180, 30d supply, fill #0

## 2018-12-27 ENCOUNTER — Other Ambulatory Visit: Payer: Self-pay | Admitting: Family Medicine

## 2019-01-03 NOTE — Unmapped (Signed)
[facility] Pharmacist Intervention    Type of intervention: NTI/transplant medication change from brand to generic    Medication: myfortic/mycophenolate and prograf/tacrolimus    Problem: patient currently on brand myfortic and prograf. Due to insurance restrictions, new rxs obtained from clinic for generic mycophenolate and tacrolimus rxs    Intervention: I spoke with patient today and communicated this change. She is aware that dosing will stay the same, she is now just going to be on the generic for prograf and myfortic. She was instructed to call ssc with any questions when order arrives. She was also instructed to contact coordinator on day she switches to generic for possible labwork needs. I will also message clinic today as well    Follow up needed: none    Approximate time spent: 10 minutes    Thad Ranger   [facility] Specialty Pharmacist          [facility] Clinical Assessment & Refill Coordination Note    Nancy Bowman [place], [place]: 1966-04-07  Phone: [phone] (home)     All above HIPAA information was verified with patient.     Specialty Medication(s):   Transplant: Myfortic 180mg ,  mycophenolic acid 180mg , Prograf 1mg  and tacrolimus 1mg      Current Outpatient Medications   Medication Sig Dispense Refill   ??? acetaminophen (TYLENOL) 500 MG tablet Take 1,000 mg by mouth every four (4) hours as needed.      ??? aspirin 81 MG chewable tablet Chew 1 tablet (81 mg total) daily. 30 tablet 11   ??? atorvastatin (LIPITOR) 40 MG tablet Take 1 tablet (40 mg total) by mouth daily. 30 tablet 11   ??? carvedilol (COREG) 12.5 MG tablet Take 1 tablet (12.5 mg total) by mouth Two (2) times a day. 60 tablet 11   ??? cetirizine (ZYRTEC) 5 MG chewable tablet Chew 5 mg daily.     ??? cholecalciferol, vitamin D3, (VITAMIN D3) 1,000 unit capsule Take 1,000 Units by mouth daily.     ??? fluticasone (FLONASE) 50 mcg/actuation nasal spray Two sprays each nare twice a day. 16 g 5   ??? gabapentin (NEURONTIN) 300 MG capsule Take 1 capsules (300 mg) by mouth in the morning and at noon, 600 mg in the evening. (Patient taking differently: Take 300 mg by mouth Three (3) times a day. Take 1 capsules (300 mg) by mouth in the morning and at noon, 600 mg in the evening.) 360 capsule 11   ??? glipiZIDE (GLUCOTROL) 5 MG tablet Take 1 tablet (5 mg total) by mouth daily. 30 tablet 11   ??? levETIRAcetam (KEPPRA) 500 MG tablet Take 1 tablet (500 mg total) by mouth Two (2) times a day. 60 tablet 11   ??? magnesium oxide (MAG-OX) 400 mg (241.3 mg magnesium) tablet Take 400 mg by mouth daily.     ??? miscellaneous medical supply Misc 1 knee brace 1 each 0   ??? mycophenolate (MYFORTIC) 180 MG EC tablet Take 2 tablets (360 mg total) by mouth Three (3) times a day. 180 tablet 11   ??? omeprazole (PRILOSEC) 20 MG capsule Take 1 capsule (20 mg total) by mouth two (2) times a day. 180 capsule 3   ??? oxyCODONE-acetaminophen (PERCOCET) 5-325 mg per tablet Take 1 tablet by mouth every four (4) hours as needed for pain. 20 tablet 0   ??? sodium bicarbonate 650 mg tablet Take 2 tablets (1300 mg) three times a day.  180 tablet 11   ??? tacrolimus (PROGRAF) 1 MG capsule Take 4 capsules (4 mg total) by mouth two (2) times a day. 240 capsule 11   ??? tamoxifen (NOLVADEX) 20 MG tablet Take 1 tablet (20 mg total) by mouth daily. 90 tablet 4   ??? traMADol (ULTRAM) 50 mg tablet Take 1 tablet (50 mg total) by mouth every eight (8) hours as needed for pain. 60 tablet 0   ??? valACYclovir (VALTREX) 500 MG tablet Take 500 mg by mouth daily as needed.       No current facility-administered medications for this visit.         Changes to medications: patient did not have the time to go through her entire medication list with me today, but states that nothing has changed in several months    Allergies   Allergen Reactions   ??? 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 Ibuprofen      Can't take due to kidneys   ??? Penicillins      Patient is unsure if she's allergic, was told she had a reaction as a child but thinks she's taking a cillin while sick without having a reaction.  Pt states she has taken amoxicillin in the past and did not have problem       Changes to allergies: No    SPECIALTY MEDICATION ADHERENCE     Myfortic 180mg   : 7 days of medicine on hand   Prograf 1mg   : 7 days of medicine on hand   Mycophenolate 180mg   : 0 days of medicine on hand - will be switching to this on this order  Tacrolimus 1mg   : 0 days of medicine on hand - will be switching to this on this order    Medication Adherence    Patient reported X missed doses in the last month:  0  Specialty Medication:  prograf 1mg   Patient is on additional specialty medications:  Yes  Additional Specialty Medications:  Myfortic 180mg   Patient Reported Additional Medication X Missed Doses in the Last Month:  0  Adherence tools used:  patient uses a pill box to manage medications          Specialty medication(s) dose(s) confirmed: Regimen is correct and unchanged.     Are there any concerns with adherence? No    Adherence counseling provided? Not needed    CLINICAL MANAGEMENT AND INTERVENTION      Clinical Benefit Assessment:    Do you feel the medicine is effective or helping your condition? Yes    Clinical Benefit counseling provided? Not needed    Adverse Effects Assessment:    Are you experiencing any side effects? No    Are you experiencing difficulty administering your medicine? No    Quality of Life Assessment:    How many days over the past month did your transplant  keep you from your normal activities? For example, brushing your teeth or getting up in the morning. 0    Have you discussed this with your provider? Not needed    Therapy Appropriateness:    Is therapy appropriate? Yes, therapy is appropriate and should be continued    DISEASE/MEDICATION-SPECIFIC INFORMATION      N/A    PATIENT SPECIFIC NEEDS     ? Does the patient have any physical, cognitive, or cultural barriers? No    ? Is the patient high risk? Yes, patient taking a REMS drug     ?  Does the patient require a Care Management Plan? No     ? Does the patient require physician intervention or other additional services (i.e. nutrition, smoking cessation, social work)? No      SHIPPING     Specialty Medication(s) to be Shipped:   Transplant:  mycophenolic acid 180mg  and tacrolimus 1mg     Other medication(s) to be shipped: none - patient verified all other medications ceom from outside pharmacy     Changes to insurance: No    Delivery Scheduled: Yes, Expected medication delivery date: 01/06/2019.     Medication will be delivered via UPS to the confirmed home address in [facility]    The patient will receive a drug information handout for each medication shipped and additional FDA Medication Guides as required.  Verified that patient has previously received a Conservation officer, historic buildings.    All of the patient's questions and concerns have been addressed.    Thad Ranger   [facility] Specialty Pharmacist

## 2019-01-05 MED FILL — TACROLIMUS 1 MG CAPSULE: 30 days supply | Qty: 240 | Fill #0 | Status: AC

## 2019-01-05 MED FILL — MYCOPHENOLATE SODIUM 180 MG TABLET,DELAYED RELEASE: 30 days supply | Qty: 180 | Fill #0 | Status: AC

## 2019-01-12 ENCOUNTER — Telehealth: Payer: Self-pay | Admitting: Family Medicine

## 2019-01-12 NOTE — Chronic Care Management (AMB) (Signed)
Chronic Care Management   Note  01/12/2019 Name: [name] MRN: [id number] DOB: [birth year]  [name] is a 53 y.o. year old female who is a primary care patient of Valerie Roys, DO. I reached out to [name] by phone today in response to a referral sent by Ms. Mardelle Matte Killmer's health plan.    Ms. Mills was given information about Chronic Care Management services today including:  1. CCM service includes personalized support from designated clinical staff supervised by her physician, including individualized plan of care and coordination with other care providers 2. 24/7 contact phone numbers for assistance for urgent and routine care needs. 3. Service will only be billed when office clinical staff spend 20 minutes or more in a month to coordinate care. 4. Only one practitioner may furnish and bill the service in a calendar month. 5. The patient may stop CCM services at any time (effective at the end of the month) by phone call to the office staff. 6. The patient will be responsible for cost sharing (co-pay) of up to 20% of the service fee (after annual deductible is met).  Patient agreed to services and verbal consent obtained.   Follow up plan: Telephone appointment with CCM team member scheduled for: 01/24/2019  [place]  ??[email or web address]   ??[phone]

## 2019-01-12 NOTE — Unmapped (Signed)
COVID-19 Screening Questions (to be completed on all patients that will come to [facility] for any of their appointments):    - Have you traveled outside of the state in the last two weeks?  No  - Have you come in close contact with any person(s) with confirmed COVID-19? No  - In the last two weeks have you had a fever greater than 100.56F?  No  - Do you have any new or worsening respiratory symptoms as far as: cough, SOB, sore throat ?  No  - Do you have any new or worsening loss of smell or taste, not related to chemo? No  - Do you have new onset of vomiting or diarrhea not related to another medical condition or chemotherapy? No    *If the patient answers yes to any of the above: Message sent to the Oncology Nurse Triage for further assessment of symptoms. Explained to pt that they will receive a follow-up call form a RN to further assess their symptoms.     *If the patient answers no to all of the above, proceed with screening:      Pt acknowledges visit type: In clinic visit with provider and denies questions regarding visit.     Medication Reconciliation Completed? No    Oncology visitor restrictions: Patient informed that they are permitted to bring a visitor to their appointment, however if they feel as if they are able to attend the appointment safely by alone, they are encouraged to do so. Patient informed that if they do bring a visitor, it must be someone over the age of 66 and they will both be screened at the door prior to entering and will be given a mask if they are not wearing one.     BMT Clinic Visitor Policy: BMT patients are encouraged to continue bringing their 1 visitor over the age of 46 to their appointments to provide support. Pt is advised that the patient and visitor will both be screened upon arrival and will be given a mask if they are not already wearing one. Sharyn Blitz, CNA

## 2019-01-18 ENCOUNTER — Encounter: Admit: 2019-01-18 | Discharge: 2019-01-19 | Payer: MEDICARE | Attending: Nephrology | Primary: Nephrology

## 2019-01-18 DIAGNOSIS — Z940 Kidney transplant status: Principal | ICD-10-CM

## 2019-01-18 DIAGNOSIS — E875 Hyperkalemia: Secondary | ICD-10-CM

## 2019-01-18 DIAGNOSIS — E785 Hyperlipidemia, unspecified: Secondary | ICD-10-CM

## 2019-01-18 DIAGNOSIS — I10 Essential (primary) hypertension: Secondary | ICD-10-CM

## 2019-01-18 DIAGNOSIS — D899 Disorder involving the immune mechanism, unspecified: Secondary | ICD-10-CM

## 2019-01-18 DIAGNOSIS — E872 Acidosis: Secondary | ICD-10-CM

## 2019-01-18 MED ORDER — TRAMADOL 50 MG TABLET
ORAL_TABLET | Freq: Three times a day (TID) | ORAL | 2 refills | 0 days | Status: CP | PRN
Start: 2019-01-18 — End: ?

## 2019-01-18 NOTE — Unmapped (Addendum)
Transplant Nephrology Clinic Visit      History of Present Illness    Patient is a 53 y.o. 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  She was on dialysis approximately 7 years prior to this transplant, the donor kidney had a KDPI of 65%.  She did have issues with hyperkalemia while on dialysis, and was on Kayexalate weekly.  Her post transplant course has been without rejection or recurrent disease. Patient does have evidence of a donor specific antibody to DP01 at an MFI of 2294 (stable) on 07/08/2016. Her most recent creatinine has ranged between 1.6 and 2.4.    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     In December 2016, she underwent routine mammogram that was abnormal. Follow-up mammogram in January was again highly suggestive of significant lesion in the left breast. She underwent core biopsy on 09/09/2015 which showed ductal carcinoma in situ. She subsequently underwent a partial mastectomy on 09/24/2015. 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 That final pathology showed a positive medial margin, so she underwent reexcision on 10/25/2015 for which the pathology was negative. Her surgical management was felt to be complete.     The patient was hospitalized in [place] in the beginning of January for 2-3 days with sinus congestion, shortness of breath on exertion, and scant hemoptysis. She was diagnosed with URI and treated with Levaquin. Her symptoms have improved.    Patient was admitted from 1/6 through 08/09/18 for chest pain. Serial troponins negative, echo unremarkable with EF 65-70%.     She was again admitted 1/19 through 08/24/18, after presenting with several days of left lower abdominal pain. CT notable for diverticulitis with leukocytosis on labs. She was treated with IV ciprofloxacin and Flagyl during admission. Discharged home on 8 day course of PO cipro and flagyl. Creatinine remained stable during admission.     Interval history since last seen on 08/31/18:    Her visit today is conducted via telephone due to the ongoing COVID-19 pandemic.    Since last seen, patient was seen by [facility] for pain and swelling in her right mandible. She underwent extraction of #30.    Patient feels overall well today. She reports occasional heart racing with exertion, not associated with any chest pain. These episodes usually resolve with deep breathing. Home blood pressures have been overall controlled, with occasional elevation to 140s/80s but usually 120s/70-80s. Her sugars have also been well-controlled, highest at 130s.    She has not had labs since January. Apparently she went to [facility] but they could not find the orders. Her insurance company has sent generic Prograf.    She specifically denies fever, myalgias, upper respiratory/GI symptoms, or change in taste/smell.     Review of Systems    Otherwise on review of systems patient denies fever or chills, chest pain, SOB, PND or orthopnea, lower extremity edema.  Denies abdominal pain.  No dysuria, hematuria or difficulty voiding. Bowel movements normal.  Denies joint pain or rash.  All other systems are reviewed and are negative.    Medications    Current Outpatient Medications   Medication Sig Dispense Refill   ??? acetaminophen (TYLENOL) 500 MG tablet Take 1,000 mg by mouth every four (4) hours as needed.      ??? aspirin 81 MG chewable tablet Chew 1 tablet (81 mg  total) daily. 30 tablet 11   ??? atorvastatin (LIPITOR) 40 MG tablet Take 1 tablet (40 mg total) by mouth daily. 30 tablet 11   ??? carvedilol (COREG) 12.5 MG tablet Take 1 tablet (12.5 mg total) by mouth Two (2) times a day. 60 tablet 11   ??? cetirizine (ZYRTEC) 5 MG chewable tablet Chew 5 mg daily.     ??? cholecalciferol, vitamin D3, (VITAMIN D3) 1,000 unit capsule Take 1,000 Units by mouth daily.     ??? fluticasone (FLONASE) 50 mcg/actuation nasal spray Two sprays each nare twice a day. 16 g 5   ??? gabapentin (NEURONTIN) 300 MG capsule Take 1 capsules (300 mg) by mouth in the morning and at noon, 600 mg in the evening. (Patient taking differently: Take 300 mg by mouth Three (3) times a day. Take 1 capsules (300 mg) by mouth in the morning and at noon, 600 mg in the evening.) 360 capsule 11   ??? glipiZIDE (GLUCOTROL) 5 MG tablet Take 1 tablet (5 mg total) by mouth daily. 30 tablet 11   ??? levETIRAcetam (KEPPRA) 500 MG tablet Take 1 tablet (500 mg total) by mouth Two (2) times a day. 60 tablet 11   ??? magnesium oxide (MAG-OX) 400 mg (241.3 mg magnesium) tablet Take 400 mg by mouth daily.     ??? miscellaneous medical supply Misc 1 knee brace 1 each 0   ??? mycophenolate (MYFORTIC) 180 MG EC tablet Take 2 tablets (360 mg total) by mouth Three (3) times a day. 180 tablet 11   ??? omeprazole (PRILOSEC) 20 MG capsule Take 1 capsule (20 mg total) by mouth two (2) times a day. 180 capsule 3   ??? oxyCODONE-acetaminophen (PERCOCET) 5-325 mg per tablet Take 1 tablet by mouth every four (4) hours as needed for pain. 20 tablet 0   ??? sodium bicarbonate 650 mg tablet Take 2 tablets (1300 mg) three times a day. 180 tablet 11   ??? tacrolimus (PROGRAF) 1 MG capsule Take 4 capsules (4 mg total) by mouth two (2) times a day. 240 capsule 11   ??? tamoxifen (NOLVADEX) 20 MG tablet Take 1 tablet (20 mg total) by mouth daily. 90 tablet 4   ??? traMADoL (ULTRAM) 50 mg tablet Take 1 tablet (50 mg total) by mouth every eight (8) hours as needed for pain. 60 tablet 2   ??? valACYclovir (VALTREX) 500 MG tablet Take 500 mg by mouth daily as needed.       No current facility-administered medications for this visit.        Physical Exam    General: She is in no apparent distress.  Lungs: Speaking in complete sentences without audible wheeze.  Neurological: Oriented x 3.  Psychiatric: Mood and affect appropriate.    Laboratory Results    No labs since 08/31/18.    Assessment and Plan    1. Status post renal transplant. Her last creatinine was at the upper limit of her baseline at 2.39. Patient took her prograf prior to getting labs drawn. Therefore, tac trough of 13.1 was not accurate. She will repeat labs locally. Continue current immunosuppression for now, will adjust if needed based on a good trough. Will need labs especially if starting generic Prograf.    2. Breast cancer status post resection. On tamoxifen and tolerating that well. Scheduled for regular oncology follow up and mammography screening in June.     3. History of hyperkalemia. Last potassium was only mildly elevated at 5.1. She has been  educated on low potassium diet. Would not treat unless > 6.    4. Seasonal allergies. On Flonase nasal spay, instructed to take Claritin or Zyrtec if needed.     5. Metabolic acidosis. HCO3 only mildly decreased at 19, currently on supplementation.     6. History of seizure disorder. Patient denies seizure activity and continues on Keppra.     7. Hypertension. Blood pressure controlled on current regimen.     8. Chronic lower back and leg pain. Stable. Tramadol refilled.    9. Diabetes mellitus. Hgb A1c 6.8 in January 2020. Managed elsewhere. Metformin was discontinued during recent admission 08/2018 and she was transitioned to Glipizide 5 mg daily. Reports good glucose control.    10. Elevated LDL of 103 in 08/2017. Continues to trend down, 27 on last labs. On atorvastatin. Will continue to follow closely.     11. Health maintenance. She received a flu shot this past fall. Getting routine mammograms to follow up breast cancer. Still needs Shingrix vaccine. We discussed the importance of social distancing, wearing a mask outside the home, and hand washing/sanitizing. We discussed that this is particularly important for transplant patients who are at risk for complications from coronavirus.    12. Will see patient back in 6 months, or sooner if needed. Will make sure she has lab orders.      Scribe's Attestation: Lisbeth Ply, MD obtained and performed the history, physical exam and medical decision making elements that were entered into the chart. Signed by Gaye Alken, Scribe, on January 18, 2019 at 2:11 PM.    ----------------------------------------------------------------------------------------------------------------------  January 19, 2019 1:58 PM. Documentation assistance provided by the Scribe. I was present during the time the encounter was recorded. The information recorded by the Scribe was done at my direction and has been reviewed and validated by me.  ----------------------------------------------------------------------------------------------------------------------      I spent 23 minutes on the phone with the patient. I spent an additional 12 minutes on pre- and post-visit activities.     The patient was physically located in [place] or a state in which I am permitted to provide care. The patient and/or parent/guardian understood that s/he may incur co-pays and cost sharing, and agreed to the telemedicine visit. The visit was reasonable and appropriate under the circumstances given the patient's presentation at the time.    The patient and/or parent/guardian has been advised of the potential risks and limitations of this mode of treatment (including, but not limited to, the absence of in-person examination) and has agreed to be treated using telemedicine. The patient's/patient's family's questions regarding telemedicine have been answered.     If the phone/video visit was completed in an ambulatory setting, the patient and/or parent/guardian has also been advised to contact their provider???s office for worsening conditions, and seek emergency medical treatment and/or call 911 if the patient deems either necessary.

## 2019-01-24 ENCOUNTER — Telehealth: Payer: Medicare Other

## 2019-01-26 NOTE — Unmapped (Signed)
[facility] Refill Coordination Note    Specialty Medication(s) to be Shipped:   Transplant:  mycophenolic acid 180mg  and tacrolimus 1mg     Other medication(s) to be shipped: none     Nancy Bowman, DOB: [birth year]  Phone: [phone] (home)       All above HIPAA information was verified with patient.     Completed refill call assessment today to schedule patient's medication shipment from the [facility] [phone]).       Specialty medication(s) and dose(s) confirmed: Regimen is correct and unchanged.   Changes to medications: Timberlyn reports no changes at this time.  Changes to insurance: No  Questions for the pharmacist: No    Confirmed patient received Welcome Packet with first shipment. The patient will receive a drug information handout for each medication shipped and additional FDA Medication Guides as required.       DISEASE/MEDICATION-SPECIFIC INFORMATION        N/A    SPECIALTY MEDICATION ADHERENCE     Medication Adherence    Patient reported X missed doses in the last month:  0  Adherence tools used:  patient uses a pill box to manage medications          Tacrolimus 1mg : 10 days on hand  Mycophenolate 180mg : 10 days on hand      SHIPPING     Shipping address confirmed in Epic.     Delivery Scheduled: Yes, Expected medication delivery date: 02/01/19.     Medication will be delivered via UPS to the home address in Epic WAM.    [place] A Nicki Furlan   [facility] Shared [facility] Specialty Technician

## 2019-01-31 MED FILL — MYCOPHENOLATE SODIUM 180 MG TABLET,DELAYED RELEASE: ORAL | 30 days supply | Qty: 180 | Fill #1

## 2019-01-31 MED FILL — MYCOPHENOLATE SODIUM 180 MG TABLET,DELAYED RELEASE: 30 days supply | Qty: 180 | Fill #1 | Status: AC

## 2019-01-31 MED FILL — TACROLIMUS 1 MG CAPSULE, IMMEDIATE-RELEASE: ORAL | 30 days supply | Qty: 240 | Fill #1

## 2019-01-31 MED FILL — TACROLIMUS 1 MG CAPSULE: 30 days supply | Qty: 240 | Fill #1 | Status: AC

## 2019-02-02 NOTE — Telephone Encounter (Signed)
LVM asking pt to call office and schedule colonoscopy

## 2019-02-08 ENCOUNTER — Ambulatory Visit: Payer: Self-pay | Admitting: Pharmacist

## 2019-02-08 ENCOUNTER — Telehealth: Payer: Self-pay

## 2019-02-08 NOTE — Chronic Care Management (AMB) (Signed)
  Chronic Care Management   Note  02/08/2019 Name: [name] MRN: [id number] DOB: [birth year]  [name] is a 53 y.o. year old female who is a primary care patient of Valerie Roys, DO. The CCM team was consulted for assistance with chronic disease management and care coordination needs.  Received referral from patient's insurance plan due to high risk status; patient consented to CCM services on 01/12/2019.   Contacted patient. Was unable to leave voicemail to ask for a return call.     Follow up plan: - If I do not hear back from her, will outreach again in the next 4-5 weeks  Catie Darnelle Maffucci, PharmD Clinical Pharmacist [place] [phone]

## 2019-02-10 ENCOUNTER — Other Ambulatory Visit: Payer: Self-pay | Admitting: Family Medicine

## 2019-02-10 ENCOUNTER — Other Ambulatory Visit
Admission: RE | Admit: 2019-02-10 | Discharge: 2019-02-10 | Disposition: A | Discharge status: Home / Self Care | Payer: Medicare Other | Source: Ambulatory Visit | Attending: Nephrology | Admitting: Nephrology

## 2019-02-10 DIAGNOSIS — N390 Urinary tract infection, site not specified: Secondary | ICD-10-CM | POA: Diagnosis not present

## 2019-02-10 DIAGNOSIS — D631 Anemia in chronic kidney disease: Secondary | ICD-10-CM | POA: Diagnosis not present

## 2019-02-10 DIAGNOSIS — B259 Cytomegaloviral disease, unspecified: Secondary | ICD-10-CM | POA: Insufficient documentation

## 2019-02-10 DIAGNOSIS — N189 Chronic kidney disease, unspecified: Secondary | ICD-10-CM | POA: Diagnosis not present

## 2019-02-10 DIAGNOSIS — E1129 Type 2 diabetes mellitus with other diabetic kidney complication: Secondary | ICD-10-CM | POA: Diagnosis not present

## 2019-02-10 DIAGNOSIS — Z79899 Other long term (current) drug therapy: Secondary | ICD-10-CM | POA: Diagnosis not present

## 2019-02-10 DIAGNOSIS — Z789 Other specified health status: Secondary | ICD-10-CM | POA: Diagnosis not present

## 2019-02-10 DIAGNOSIS — Z9483 Pancreas transplant status: Secondary | ICD-10-CM | POA: Insufficient documentation

## 2019-02-10 DIAGNOSIS — Z940 Kidney transplant status: Secondary | ICD-10-CM | POA: Insufficient documentation

## 2019-02-10 DIAGNOSIS — D899 Disorder involving the immune mechanism, unspecified: Secondary | ICD-10-CM | POA: Diagnosis not present

## 2019-02-10 LAB — CBC WITH DIFFERENTIAL/PLATELET
Abs Immature Granulocytes: 0.03 10*3/uL (ref 0.00–0.07)
Basophils Absolute: 0 10*3/uL (ref 0.0–0.1)
Basophils Relative: 1 %
Eosinophils Absolute: 0.2 10*3/uL (ref 0.0–0.5)
Eosinophils Relative: 2 %
HCT: 38.4 % (ref 36.0–46.0)
Hemoglobin: 12.3 g/dL (ref 12.0–15.0)
Immature Granulocytes: 0 %
Lymphocytes Relative: 21 %
Lymphs Abs: 1.6 10*3/uL (ref 0.7–4.0)
MCH: 30.4 pg (ref 26.0–34.0)
MCHC: 32 g/dL (ref 30.0–36.0)
MCV: 95 fL (ref 80.0–100.0)
Monocytes Absolute: 0.5 10*3/uL (ref 0.1–1.0)
Monocytes Relative: 7 %
Neutro Abs: 5.3 10*3/uL (ref 1.7–7.7)
Neutrophils Relative %: 69 %
Platelets: 221 10*3/uL (ref 150–400)
RBC: 4.04 MIL/uL (ref 3.87–5.11)
RDW: 13.1 % (ref 11.5–15.5)
WBC: 7.6 10*3/uL (ref 4.0–10.5)
nRBC: 0 % (ref 0.0–0.2)

## 2019-02-10 LAB — PHOSPHORUS: Phosphorus: 3.2 mg/dL (ref 2.5–4.6)

## 2019-02-10 LAB — BASIC METABOLIC PANEL
Anion gap: 10 (ref 5–15)
BUN: 42 mg/dL — ABNORMAL HIGH (ref 6–20)
CO2: 19 mmol/L — ABNORMAL LOW (ref 22–32)
Calcium: 10 mg/dL (ref 8.9–10.3)
Chloride: 105 mmol/L (ref 98–111)
Creatinine, Ser: 2.34 mg/dL — ABNORMAL HIGH (ref 0.44–1.00)
GFR calc Af Amer: 27 mL/min — ABNORMAL LOW (ref >60)
GFR calc non Af Amer: 23 mL/min — ABNORMAL LOW (ref >60)
Glucose, Bld: 199 mg/dL — ABNORMAL HIGH (ref 70–99)
Potassium: 4.8 mmol/L (ref 3.5–5.1)
Sodium: 134 mmol/L — ABNORMAL LOW (ref 135–145)

## 2019-02-10 LAB — MAGNESIUM: Magnesium: 1.7 mg/dL (ref 1.7–2.4)

## 2019-02-13 LAB — TACROLIMUS LEVEL: Tacrolimus (FK506) - LabCorp: 7.1 ng/mL (ref 2.0–20.0)

## 2019-02-14 ENCOUNTER — Other Ambulatory Visit: Payer: Self-pay

## 2019-02-14 NOTE — Telephone Encounter (Signed)
Fax from [facility] requesting Rx refill on gabapentin 300 mg capsule.

## 2019-02-15 NOTE — Unmapped (Signed)
Transplant Nephrology Video Visit      History of Present Illness    Patient is a 53 y.o. 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  She was on dialysis approximately 7 years prior to this transplant, the donor kidney had a KDPI of 65%.  She did have issues with hyperkalemia while on dialysis, and was on Kayexalate weekly.  Her post transplant course has been without rejection or recurrent disease. Patient does have evidence of a donor specific antibody to DP01 at an MFI of 2294 (stable) on 07/08/2016. Her most recent creatinine has ranged between 1.6 and 2.4.    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     In December 2016, she underwent routine mammogram that was abnormal. Follow-up mammogram in January was again highly suggestive of significant lesion in the left breast. She underwent core biopsy on 09/09/2015 which showed ductal carcinoma in situ. She subsequently underwent a partial mastectomy on 09/24/2015. 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 That final pathology showed a positive medial margin, so she underwent reexcision on 10/25/2015 for which the pathology was negative. Her surgical management was felt to be complete.     The patient was hospitalized in [place] in the beginning of January for 2-3 days with sinus congestion, shortness of breath on exertion, and scant hemoptysis. She was diagnosed with URI and treated with Levaquin. Her symptoms have improved.    Patient was admitted from 1/6 through 08/09/18 for chest pain. Serial troponins negative, echo unremarkable with EF 65-70%.     She was again admitted 1/19 through 08/24/18, after presenting with several days of left lower abdominal pain. CT notable for diverticulitis with leukocytosis on labs. She was treated with IV ciprofloxacin and Flagyl during admission. Discharged home on 8 day course of PO cipro and flagyl. Creatinine remained stable during admission.     Interval history since last seen on 01/18/2019:    Her visit today is conducted via telephone due to the ongoing COVID-19 pandemic.     At her last visit, I had intended to see her back in 6 months. This appointment was a follow up from a remote appointment, I had not intended to see her back so quickly.    Fortunately, she is feeling well. She reports some decreased appetite in the morning that she associates with her diabetes meds. She tries to eat a small meal in the morning and then larger ones later. No abdominal pain, nausea or vomiting. Weight has been stable. She continues to report good blood pressure control, 120s/70s.     She specifically denies fever, myalgias, upper respiratory/GI symptoms, or change in taste/smell.     Review of Systems    Otherwise on review of systems patient denies fever or chills, chest pain, SOB, PND or orthopnea, lower extremity edema.  Denies abdominal pain.  No dysuria, hematuria or difficulty voiding. Bowel movements normal.  Denies joint pain or rash.  All other systems are reviewed and are negative.    Medications    Current Outpatient Medications   Medication Sig Dispense Refill   ??? aspirin 81 MG chewable tablet Chew 1 tablet (81 mg total) daily. 30 tablet 11   ??? atorvastatin (LIPITOR) 40 MG tablet Take 1 tablet (40 mg total) by mouth daily. 30 tablet 11   ??? carvedilol (COREG) 12.5 MG tablet Take 1  tablet (12.5 mg total) by mouth Two (2) times a day. 60 tablet 11   ??? cetirizine (ZYRTEC) 5 MG chewable tablet Chew 5 mg daily.     ??? cholecalciferol, vitamin D3, (VITAMIN D3) 1,000 unit capsule Take 1,000 Units by mouth daily.     ??? fluticasone (FLONASE) 50 mcg/actuation nasal spray Two sprays each nare twice a day. 16 g 5   ??? gabapentin (NEURONTIN) 300 MG capsule Take 1 capsules (300 mg) by mouth in the morning and at noon, 600 mg in the evening. 360 capsule 11   ??? glipiZIDE (GLUCOTROL) 5 MG tablet Take 1 tablet (5 mg total) by mouth daily. 30 tablet 11   ??? levETIRAcetam (KEPPRA) 500 MG tablet Take 1 tablet (500 mg total) by mouth Two (2) times a day. 60 tablet 11   ??? magnesium oxide (MAG-OX) 400 mg (241.3 mg magnesium) tablet Take 400 mg by mouth daily.     ??? miscellaneous medical supply Misc 1 knee brace 1 each 0   ??? mycophenolate (MYFORTIC) 180 MG EC tablet Take 2 tablets (360 mg total) by mouth Three (3) times a day. 180 tablet 11   ??? omeprazole (PRILOSEC) 20 MG capsule Take 1 capsule (20 mg total) by mouth two (2) times a day. 180 capsule 3   ??? sodium bicarbonate 650 mg tablet Take 2 tablets (1300 mg) three times a day. 180 tablet 11   ??? tacrolimus (PROGRAF) 1 MG capsule Take 4 capsules (4 mg total) by mouth two (2) times a day. 240 capsule 11   ??? tamoxifen (NOLVADEX) 20 MG tablet Take 1 tablet (20 mg total) by mouth daily. 90 tablet 4   ??? traMADoL (ULTRAM) 50 mg tablet Take 1 tablet (50 mg total) by mouth every eight (8) hours as needed for pain. 60 tablet 2   ??? valACYclovir (VALTREX) 500 MG tablet Take 500 mg by mouth daily as needed.       No current facility-administered medications for this visit.        Physical Exam    Constitutional: Alert and oriented. Well appearing and in no distress.  Eyes: Conjunctivae are normal.  ENT       Head: Normocephalic and atraumatic.       Nose: No rhinorrhea.       Mouth/Throat: Mucous membranes appear moist.       Neck: No audible stridor.  Cardiovascular: Blood pressure as documented above.  Respiratory: Normal respiratory pattern and effort. No audible wheezing or other abnormal breath sounds. Speaking easily in full sentences. No cough noted during my evaluation of the patient.  Gastrointestinal: Deferred.  Musculoskeletal: Moving all extremities normally.  Neurologic: Normal speech and language. No gross focal neurologic deficits are appreciated.  Skin: Skin appears warm and dry. No rash visible.  Psychiatric: Mood and affect are normal. Speech and behavior are normal.      Laboratory Results    Labs from 02/10/19: Creatinine 2.34, K 4.8, CO2 19, Hgb 12.3, wbc 7.6, plt 221, tacrolimus 7.1.    Assessment and Plan    1. Status post renal transplant. Her last creatinine was within her baseline at 2.34. Her prograf trough was appropriate at 7.1. Continue current immunosuppression.    2. Breast cancer status post resection. On tamoxifen and tolerating that well. Had been scheduled for regular oncology follow up and mammography screening in June, has not been done yet, presumed due to covid.     3. History of hyperkalemia. Last potassium was normal  at 4.8. She has been educated on low potassium diet. Would not treat unless > 6.    4. Seasonal allergies. On Flonase nasal spay, instructed to take Claritin or Zyrtec if needed.     5. Metabolic acidosis. HCO3 only mildly decreased at 19, currently on supplementation.     6. History of seizure disorder. Patient denies seizure activity and continues on Keppra.     7. Hypertension. Blood pressure controlled on current regimen.     8. Chronic lower back and leg pain. Stable.     9. Diabetes mellitus. Hgb A1c 6.8 in January 2020. Managed elsewhere. Metformin was discontinued during recent admission 08/2018 and she was transitioned to Glipizide 5 mg daily. Reports good glucose control, though her nonfasting glucose on last labs was 199.    10. Elevated LDL of 103 in 08/2017. Continues to trend down, 27 on last labs. On atorvastatin. Will continue to follow closely.     11. Health maintenance. She received a flu shot this past fall. Still needs Shingrix vaccine. We discussed the importance of social distancing, wearing a mask outside the home, and hand washing/sanitizing. We discussed that this is particularly important for transplant patients who are at risk for complications from coronavirus.    12. Will see patient back in 6 months, or sooner if needed.       I spent 10 minutes on the audio/video with the patient. I spent an additional 10 minutes on pre- and post-visit activities.     The patient was physically located in [place] or a state in which I am permitted to provide care. The patient and/or parent/guardian understood that s/he may incur co-pays and cost sharing, and agreed to the telemedicine visit. The visit was reasonable and appropriate under the circumstances given the patient's presentation at the time.    The patient and/or parent/guardian has been advised of the potential risks and limitations of this mode of treatment (including, but not limited to, the absence of in-person examination) and has agreed to be treated using telemedicine. The patient's/patient's family's questions regarding telemedicine have been answered.     If the visit was completed in an ambulatory setting, the patient and/or parent/guardian has also been advised to contact their provider???s office for worsening conditions, and seek emergency medical treatment and/or call 911 if the patient deems either necessary.

## 2019-02-16 ENCOUNTER — Encounter: Admit: 2019-02-16 | Discharge: 2019-02-17 | Payer: MEDICARE | Attending: Nephrology | Primary: Nephrology

## 2019-02-16 DIAGNOSIS — D899 Disorder involving the immune mechanism, unspecified: Secondary | ICD-10-CM

## 2019-02-16 DIAGNOSIS — E785 Hyperlipidemia, unspecified: Secondary | ICD-10-CM

## 2019-02-16 DIAGNOSIS — Z940 Kidney transplant status: Principal | ICD-10-CM

## 2019-02-16 DIAGNOSIS — E875 Hyperkalemia: Secondary | ICD-10-CM

## 2019-02-16 DIAGNOSIS — I10 Essential (primary) hypertension: Secondary | ICD-10-CM

## 2019-02-16 DIAGNOSIS — E872 Acidosis: Secondary | ICD-10-CM

## 2019-02-17 ENCOUNTER — Other Ambulatory Visit: Payer: Self-pay | Admitting: Family Medicine

## 2019-02-17 MED ORDER — GABAPENTIN 300 MG PO CAPS: 600.0000 mg | ORAL_CAPSULE | Freq: Three times a day (TID) | ORAL | 1 refills | Status: AC

## 2019-02-22 NOTE — Unmapped (Signed)
[facility] Refill Coordination Note    Specialty Medication(s) to be Shipped:   Transplant:  mycophenolic acid 180mg  and tacrolimus 1mg      Nancy Bowman, DOB: 1965-10-31  Phone: [phone] (home)     All above HIPAA information was verified with patient.     Completed refill call assessment today to schedule patient's medication shipment from the [facility] [phone]).       Specialty medication(s) and dose(s) confirmed: Regimen is correct and unchanged.   Changes to medications: Nancy Bowman reports no changes reported at this time.  Changes to insurance: No  Questions for the pharmacist: No    Confirmed patient received Welcome Packet with first shipment. The patient will receive a drug information handout for each medication shipped and additional FDA Medication Guides as required.       DISEASE/MEDICATION-SPECIFIC INFORMATION        N/A    SPECIALTY MEDICATION ADHERENCE     Medication Adherence    Patient reported X missed doses in the last month: 0  Specialty Medication: Mycophenolate 180mg   Patient is on additional specialty medications: Yes  Additional Specialty Medications: Tacrolimus 1mg   Patient Reported Additional Medication X Missed Doses in the Last Month: 0  Patient is on more than two specialty medications: No  Adherence tools used: patient uses a pill box to manage medications        Mycophenolate 180 mg: 12 days of medicine on hand   Tacrolimus 1 mg: 12 days of medicine on hand     SHIPPING     Shipping address confirmed in Epic.     Delivery Scheduled: Yes, Expected medication delivery date: 03/07/2019.     Medication will be delivered via UPS to the home address in Epic [place].    Nancy Bowman P Nancy Bowman   [facility] Specialty Technician

## 2019-03-06 MED FILL — MYCOPHENOLATE SODIUM 180 MG TABLET,DELAYED RELEASE: 30 days supply | Qty: 180 | Fill #2 | Status: AC

## 2019-03-06 MED FILL — TACROLIMUS 1 MG CAPSULE: 30 days supply | Qty: 240 | Fill #2 | Status: AC

## 2019-03-06 MED FILL — MYCOPHENOLATE SODIUM 180 MG TABLET,DELAYED RELEASE: ORAL | 30 days supply | Qty: 180 | Fill #2

## 2019-03-06 MED FILL — TACROLIMUS 1 MG CAPSULE, IMMEDIATE-RELEASE: ORAL | 30 days supply | Qty: 240 | Fill #2

## 2019-03-15 ENCOUNTER — Telehealth: Payer: Self-pay

## 2019-03-17 ENCOUNTER — Telehealth: Payer: Self-pay

## 2019-03-17 ENCOUNTER — Ambulatory Visit: Payer: Self-pay | Admitting: Pharmacist

## 2019-03-17 NOTE — Chronic Care Management (AMB) (Signed)
  Chronic Care Management   Note  03/17/2019 Name: [name] MRN: [id number] DOB: 05-21-1966  [name] is a 53 y.o. year old female who is a primary care patient of Valerie Roys, DO. The CCM team was consulted for assistance with chronic disease management and care coordination needs.    Scheduled outreach on Wednesday, 03/15/2019; called and connected with patient. However, she was at a funeral and requested that I call back another day. We agreed that I would call back today (Friday). Contacted patient; call went straight to voicemail.  Follow up plan: - If I do not hear back from patient, will outreach again in 2-4 weeks. Patient is DUE for an appointment with primary care provider Park Liter, DO.   Catie Darnelle Maffucci, PharmD Clinical Pharmacist [place] [phone]

## 2019-03-23 DIAGNOSIS — M1812 Unilateral primary osteoarthritis of first carpometacarpal joint, left hand: Secondary | ICD-10-CM | POA: Diagnosis not present

## 2019-03-23 DIAGNOSIS — W010XXA Fall on same level from slipping, tripping and stumbling without subsequent striking against object, initial encounter: Secondary | ICD-10-CM | POA: Diagnosis not present

## 2019-03-23 DIAGNOSIS — M542 Cervicalgia: Secondary | ICD-10-CM | POA: Diagnosis not present

## 2019-03-23 DIAGNOSIS — S199XXA Unspecified injury of neck, initial encounter: Secondary | ICD-10-CM | POA: Diagnosis not present

## 2019-03-23 DIAGNOSIS — M545 Low back pain: Secondary | ICD-10-CM | POA: Diagnosis not present

## 2019-03-23 DIAGNOSIS — S6992XA Unspecified injury of left wrist, hand and finger(s), initial encounter: Secondary | ICD-10-CM | POA: Diagnosis not present

## 2019-03-29 NOTE — Unmapped (Signed)
Hi Darl Pikes,    We are attempting to schedule patient Nancy Bowman for a mammo.  An order is needed to proceed with scheduling the appointment.      Please place an order and provide updates when complete so our team can finalize the scheduling request.    Thank you,   Christell Faith  [facility]   [phone]

## 2019-04-02 MED ORDER — LEVETIRACETAM 500 MG TABLET
ORAL_TABLET | Freq: Two times a day (BID) | ORAL | 11 refills | 30.00000 days | Status: CP
Start: 2019-04-02 — End: 2019-04-03

## 2019-04-02 NOTE — Unmapped (Signed)
Attempted to call patient twice; went to mailbox and mailbox is full.   On third attempt able to reach patient.     Pt paged on call TNC c/o I am out of my Keppra and my head is starting to hurt me. Pt's mother had a stroke at the beginning of the month and reports she's had so much on her mind she didn't think to get her medicine refilled. She called her local drug store yesterday and no refills were left. I asked how many doses of keppra missed pt replied I don't even know. Sent Keppra prescription to [facility], receipt confirmed by pharmacy. Sent epic inbasket message to primary TNC.

## 2019-04-03 MED ORDER — LEVETIRACETAM 500 MG TABLET
ORAL_TABLET | Freq: Two times a day (BID) | ORAL | 11 refills | 30.00000 days | Status: CP
Start: 2019-04-03 — End: ?

## 2019-04-03 MED FILL — MYCOPHENOLATE SODIUM 180 MG TABLET,DELAYED RELEASE: 30 days supply | Qty: 180 | Fill #3 | Status: AC

## 2019-04-03 MED FILL — TACROLIMUS 1 MG CAPSULE, IMMEDIATE-RELEASE: ORAL | 30 days supply | Qty: 240 | Fill #3

## 2019-04-03 MED FILL — MYCOPHENOLATE SODIUM 180 MG TABLET,DELAYED RELEASE: ORAL | 30 days supply | Qty: 180 | Fill #3

## 2019-04-03 MED FILL — TACROLIMUS 1 MG CAPSULE: 30 days supply | Qty: 240 | Fill #3 | Status: AC

## 2019-04-03 NOTE — Unmapped (Signed)
[facility] Refill Coordination Note    Specialty Medication(s) to be Shipped:   Transplant:  mycophenolic acid 180mg  and tacrolimus 1mg     Other medication(s) to be shipped: n/a     Nancy Bowman, DOB: [birth year]  Phone: [phone] (home)       All above HIPAA information was verified with patient.     Completed refill call assessment today to schedule patient's medication shipment from the [facility] [phone]).       Specialty medication(s) and dose(s) confirmed: Regimen is correct and unchanged.   Changes to medications: Nancy Bowman reports no changes at this time.  Changes to insurance: No  Questions for the pharmacist: No    Confirmed patient received Welcome Packet with first shipment. The patient will receive a drug information handout for each medication shipped and additional FDA Medication Guides as required.       DISEASE/MEDICATION-SPECIFIC INFORMATION        N/A    SPECIALTY MEDICATION ADHERENCE     Medication Adherence    Patient reported X missed doses in the last month: 0  Specialty Medication: mycophenolate 180 MG EC tablet   Patient is on additional specialty medications: Yes  Additional Specialty Medications: tacrolimus 1 MG capsule   Patient Reported Additional Medication X Missed Doses in the Last Month: 0  Patient is on more than two specialty medications: No  Adherence tools used: patient uses a pill box to manage medications                Mycophenolate 180 mg: 7 days of medicine on hand   Tacrolimus 1 mg: 7 days of medicine on hand          SHIPPING     Shipping address confirmed in Epic.     Delivery Scheduled: Yes, Expected medication delivery date: 04/04/19.     Medication will be delivered via UPS to the home address in Epic [place].    Nancy Bowman   [facility] Specialty Technician

## 2019-04-04 NOTE — Unmapped (Signed)
Hi,     Patient has contacted the Communication Center in regards to the following symptom:     Patient stated that she experienced a fall back on 03/08/2019 and since her fall she now notices a lump in her right breast. Patient stated that she is not in pain.     Please contact Chynah at [phone]    Check Indicates criteria has been reviewed and confirmed with the patient:    [x]  Preferred Name   [x]  DOB and/or MR#  [x]  Preferred Contact Method  [x]  Phone Number(s)   []  MyChart     A page or telephone call has been made to the corresponding clinic.     Thank you,  Jannette Spanner   [facility] Communication Center   [phone]

## 2019-04-12 ENCOUNTER — Other Ambulatory Visit: Payer: Self-pay | Admitting: Family Medicine

## 2019-04-19 ENCOUNTER — Ambulatory Visit: Payer: Self-pay | Admitting: Pharmacist

## 2019-04-19 ENCOUNTER — Telehealth: Payer: Self-pay

## 2019-04-19 NOTE — Chronic Care Management (AMB) (Signed)
  Chronic Care Management   Note  04/19/2019 Name: [name] MRN: [id number] DOB: 1966-05-31  [name] is a 53 y.o. year old female who is a primary care patient of Valerie Roys, DO. The CCM team was consulted for assistance with chronic disease management and care coordination needs.    Contacted patient to discuss CCM program. She was visiting a friend that had just gotten out of the hospital and was unable to talk for long. I briefly reviewed the CCM team, and she note she was interested, but requested that I give her a call back next week to schedule a full appointment call later in October.    Follow up plan: - Will collaborate with CCM scheduling team to assist this patient in connecting with CCM service  Catie Darnelle Maffucci, PharmD Clinical Pharmacist [place] [phone]

## 2019-04-25 ENCOUNTER — Encounter: Payer: Self-pay | Admitting: Family Medicine

## 2019-04-25 NOTE — Unmapped (Signed)
[facility] Refill Coordination Note    Specialty Medication(s) to be Shipped:   Transplant:  mycophenolic acid 180mg  and tacrolimus 1mg     Other medication(s) to be shipped: none     Nancy Bowman, DOB: 12-27-1965  Phone: [phone] (home)       All above HIPAA information was verified with patient.     Completed refill call assessment today to schedule patient's medication shipment from the [facility] [phone]).       Specialty medication(s) and dose(s) confirmed: Regimen is correct and unchanged.   Changes to medications: Nancy Bowman reports no changes at this time.  Changes to insurance: No  Questions for the pharmacist: No    Confirmed patient received Welcome Packet with first shipment. The patient will receive a drug information handout for each medication shipped and additional FDA Medication Guides as required.       DISEASE/MEDICATION-SPECIFIC INFORMATION        N/A    SPECIALTY MEDICATION ADHERENCE     Medication Adherence    Patient reported X missed doses in the last month: 0  Adherence tools used: patient uses a pill box to manage medications      Tacrolimus 1mg : 10 days on hand   Mycophenolate 180mg : 10 days on hand          SHIPPING     Shipping address confirmed in Epic.     Delivery Scheduled: Yes, Expected medication delivery date: 05/03/19.     Medication will be delivered via UPS to the home address in Epic WAM.    Nancy Bowman   [facility] Specialty Nancy Bowman

## 2019-05-02 MED FILL — TACROLIMUS 1 MG CAPSULE: 30 days supply | Qty: 240 | Fill #4 | Status: AC

## 2019-05-02 MED FILL — MYCOPHENOLATE SODIUM 180 MG TABLET,DELAYED RELEASE: 30 days supply | Qty: 180 | Fill #4 | Status: AC

## 2019-05-02 MED FILL — TACROLIMUS 1 MG CAPSULE, IMMEDIATE-RELEASE: ORAL | 30 days supply | Qty: 240 | Fill #4

## 2019-05-02 MED FILL — MYCOPHENOLATE SODIUM 180 MG TABLET,DELAYED RELEASE: ORAL | 30 days supply | Qty: 180 | Fill #4

## 2019-05-08 DIAGNOSIS — Z940 Kidney transplant status: Secondary | ICD-10-CM

## 2019-05-08 DIAGNOSIS — G629 Polyneuropathy, unspecified: Secondary | ICD-10-CM

## 2019-05-08 MED ORDER — GABAPENTIN 300 MG CAPSULE
ORAL_CAPSULE | 11 refills | 0 days | Status: CP
Start: 2019-05-08 — End: ?

## 2019-05-09 NOTE — Unmapped (Signed)
Returned on call page. Patient states she has been trying to contact primary TNC to get refill for gabapentin and has been unable to make any contact. Pt request prescription be sent to [facility] in [place]. Prescription sent. Message routed to primary TNC.

## 2019-05-10 ENCOUNTER — Ambulatory Visit: Payer: Self-pay

## 2019-05-10 ENCOUNTER — Other Ambulatory Visit: Payer: Self-pay

## 2019-05-10 ENCOUNTER — Ambulatory Visit (INDEPENDENT_AMBULATORY_CARE_PROVIDER_SITE_OTHER): Payer: Medicare Other | Admitting: Family Medicine

## 2019-05-10 DIAGNOSIS — J069 Acute upper respiratory infection, unspecified: Secondary | ICD-10-CM

## 2019-05-10 MED ORDER — AZITHROMYCIN 250 MG PO TABS: ORAL_TABLET | ORAL | 0 refills | Status: DC

## 2019-05-10 NOTE — Telephone Encounter (Signed)
Pt. Called to report onset of sinus pressure and drainage about 2 weeks ago.  Stated she started coughing about 4 days ago, and began taking Mucinex.  Reported coughing-up yellow-white phlegm.  Also reported had 4 episodes of very small amt. Of blood in the phlegm during the night into today.  Denied shortness of breath, chest tightness, or fever.  Stated she did have some wheezing prior to taking the Mucinex, but that has resolved.  Denied other symptoms.  Voiced concern of developing worsening chest congestion.  Asking about possibly needing antibiotic.  Notified Flow Coordinator.  Per Flow Coordinator, a nurse will contact the pt. For a 2:30 PM virtual appt. Today, with Merrie Roof.   Pt. Advised of the above and agreed with plan.    Reason for Disposition . HIGH RISK patient (e.g., age > 85 years, diabetes, heart or lung disease, weak immune system) (Exception: Has already been evaluated by healthcare provider and has no new or worsening symptoms)  Answer Assessment - Initial Assessment Questions 1. COVID-19 DIAGNOSIS: "Who made your Coronavirus (COVID-19) diagnosis?" "Was it confirmed by a positive lab test?" If not diagnosed by a HCP, ask "Are there lots of cases (community spread) where you live?" (See public health department website, if unsure)     COVID present in community; denied known exposure to COVID  2. ONSET: "When did the COVID-19 symptoms start?"      2 weeks ago had onset of sinus symptoms 3. WORST SYMPTOM: "What is your worst symptom?" (e.g., cough, fever, shortness of breath, muscle aches)     Denied shortness of breath 4. COUGH: "Do you have a cough?" If so, ask: "How bad is the cough?"       Coughing up white phlegm 5. FEVER: "Do you have a fever?" If so, ask: "What is your temperature, how was it measured, and when did it start?"    Denied  6. RESPIRATORY STATUS: "Describe your breathing?" (e.g., shortness of breath, wheezing, unable to speak)     Had some wheezing that has  subsided  7. BETTER-SAME-WORSE: "Are you getting better, staying the same or getting worse compared to yesterday?"  If getting worse, ask, "In what way?"    Is better but wanted to report the presence of small amt. Blood in phlegm  8. HIGH RISK DISEASE: "Do you have any chronic medical problems?" (e.g., asthma, heart or lung disease, weak immune system, etc.)     hx of kidney transplant, diabetes 9. PREGNANCY: "Is there any chance you are pregnant?" "When was your last menstrual period?"     N/a   10. OTHER SYMPTOMS: "Do you have any other symptoms?"  (e.g., chills, fatigue, headache, loss of smell or taste, muscle pain, sore throat)      Sinus pressure and drainage, coughing up white-yellow phlegm with a little blood mixed in; (approx. 4 times); denied chest tightness;  Denied headache, sore throat at present.  Protocols used: CORONAVIRUS (COVID-19) DIAGNOSED OR SUSPECTED-A-AH

## 2019-05-10 NOTE — Progress Notes (Signed)
   LMP 03/03/2016 (Approximate)    Subjective:    Patient ID: [name], female    DOB: 1965-09-28, 53 y.o.   MRN: [id number]  HPI: [name] is a 53 y.o. female  Chief Complaint  Patient presents with  . Sinusitis    Symptoms ongoing 2 weeks. Sinuses are better but patient states it's moved into her chest. Productive cough and patient saw some blood.  . Nasal Congestion  . Cough    . This visit was completed via telephone due to the restrictions of the COVID-19 pandemic. All issues as above were discussed and addressed. Physical exam was done as above through visual confirmation on telephone. If it was felt that the patient should be evaluated in the office, they were directed there. The patient verbally consented to this visit. . Location of the patient: home . Location of the provider: work . Those involved with this call:  . Provider: Merrie Roof, PA-C . CMA: Merilyn Baba, [place] . Front Desk/Registration: Jill Side  . Time spent on call: 15 minutes with patient face to face via video conference. More than 50% of this time was spent in counseling and coordination of care. 5 minutes total spent in review of patient's record and preparation of their chart. I verified patient identity using two factors (patient name and date of birth). Patient consents verbally to being seen via telemedicine visit today.   Patient presenting today for 2 weeks of ongoing congestion, sinus pain and pressure, chest congestion, productive cough now with bloody tinge to it. Denies fevers, chills, CP, SOB, sick contacts, recent travel. Taking mucinex which does seem to help mildly. Does have a hx of bronchitis and sinusitis fairly frequently.   Relevant past medical, surgical, family and social history reviewed and updated as indicated. Interim medical history since our last visit reviewed. Allergies and medications reviewed and updated.  Review of Systems  Per HPI unless specifically  indicated above     Objective:    LMP 03/03/2016 (Approximate)   Wt Readings from Last 3 Encounters:  10/17/18 204 lb 9.6 oz (92.8 kg)  10/03/18 205 lb 14.6 oz (93.4 kg)  09/28/18 202 lb 9.6 oz (91.9 kg)    Physical Exam  Unable to perform PE today due to patient lack of access to video technology for today's visit  Results for orders placed or performed in visit on 04/25/19  HM MAMMOGRAPHY  Result Value Ref Range   HM Mammogram 0-4 Bi-Rad 0-4 Bi-Rad, Self Reported Normal      Assessment & Plan:   Problem List Items Addressed This Visit    None    Visit Diagnoses    Upper respiratory tract infection, unspecified type    -  Primary   Given duration, will tx with zpak, mucinex, supportive home care. F/u if not improving or worsening sxs   Relevant Medications   azithromycin (ZITHROMAX) 250 MG tablet       Follow up plan: Return if symptoms worsen or fail to improve.

## 2019-05-22 NOTE — Unmapped (Signed)
[facility] Clinical Assessment & Refill Coordination Note    Nancy Bowman, DOB: Oct 08, 1965  Phone: [phone] (home)     All above HIPAA information was verified with patient.     Specialty Medication(s):   Transplant:  mycophenolic acid 180mg  and tacrolimus 1mg      Current Outpatient Medications   Medication Sig Dispense Refill   ??? aspirin 81 MG chewable tablet Chew 1 tablet (81 mg total) daily. 30 tablet 11   ??? atorvastatin (LIPITOR) 40 MG tablet Take 1 tablet (40 mg total) by mouth daily. 30 tablet 11   ??? carvedilol (COREG) 12.5 MG tablet Take 1 tablet (12.5 mg total) by mouth Two (2) times a day. 60 tablet 11   ??? cetirizine (ZYRTEC) 5 MG chewable tablet Chew 5 mg daily.     ??? cholecalciferol, vitamin D3, (VITAMIN D3) 1,000 unit capsule Take 1,000 Units by mouth daily.     ??? fluticasone (FLONASE) 50 mcg/actuation nasal spray Two sprays each nare twice a day. 16 g 5   ??? gabapentin (NEURONTIN) 300 MG capsule Take 1 capsules (300 mg) by mouth in the morning and at noon, 600 mg in the evening. 360 capsule 11   ??? glipiZIDE (GLUCOTROL) 5 MG tablet Take 1 tablet (5 mg total) by mouth daily. 30 tablet 11   ??? levETIRAcetam (KEPPRA) 500 MG tablet Take 1 tablet (500 mg total) by mouth Two (2) times a day. 60 tablet 11   ??? magnesium oxide (MAG-OX) 400 mg (241.3 mg magnesium) tablet Take 400 mg by mouth daily.     ??? miscellaneous medical supply Misc 1 knee brace 1 each 0   ??? mycophenolate (MYFORTIC) 180 MG EC tablet Take 2 tablets (360 mg total) by mouth Three (3) times a day. 180 tablet 11   ??? omeprazole (PRILOSEC) 20 MG capsule Take 1 capsule (20 mg total) by mouth two (2) times a day. 180 capsule 3   ??? sodium bicarbonate 650 mg tablet Take 2 tablets (1300 mg) three times a day. 180 tablet 11   ??? tacrolimus (PROGRAF) 1 MG capsule Take 4 capsules (4 mg total) by mouth two (2) times a day. 240 capsule 11 ??? tamoxifen (NOLVADEX) 20 MG tablet Take 1 tablet (20 mg total) by mouth daily. 90 tablet 4   ??? traMADoL (ULTRAM) 50 mg tablet Take 1 tablet (50 mg total) by mouth every eight (8) hours as needed for pain. 60 tablet 2   ??? valACYclovir (VALTREX) 500 MG tablet Take 500 mg by mouth daily as needed.       No current facility-administered medications for this visit.         Changes to medications: Nancy Bowman reports no changes at this time.    Allergies   Allergen Reactions   ??? 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 Ibuprofen      Can't take due to kidneys   ??? Penicillins      Patient is unsure if she's allergic, was told she had a reaction as a child but thinks she's taking a cillin while sick without having a reaction.  Pt states she has taken amoxicillin in the past and did not have problem       Changes to allergies: No    SPECIALTY MEDICATION ADHERENCE     Mycophenolate 180mg   : 10 days of medicine on hand   Tacrolimus 1mg   : 10 days of medicine on hand  Medication Adherence    Patient reported X missed doses in the last month: 0  Specialty Medication: mycophenolate 180mg   Patient is on additional specialty medications: Yes  Additional Specialty Medications: Tacrolimus 1mg   Patient Reported Additional Medication X Missed Doses in the Last Month: 0  Adherence tools used: patient uses a pill box to manage medications          Specialty medication(s) dose(s) confirmed: Regimen is correct and unchanged.     Are there any concerns with adherence? No    Adherence counseling provided? Not needed    CLINICAL MANAGEMENT AND INTERVENTION      Clinical Benefit Assessment:    Do you feel the medicine is effective or helping your condition? Yes    Clinical Benefit counseling provided? Not needed    Adverse Effects Assessment:    Are you experiencing any side effects? No    Are you experiencing difficulty administering your medicine? No    Quality of Life Assessment: How many days over the past month did your transplant  keep you from your normal activities? For example, brushing your teeth or getting up in the morning. 0    Have you discussed this with your provider? Not needed    Therapy Appropriateness:    Is therapy appropriate? Yes, therapy is appropriate and should be continued    DISEASE/MEDICATION-SPECIFIC INFORMATION      N/A    PATIENT SPECIFIC NEEDS     ? Does the patient have any physical, cognitive, or cultural barriers? No    ? Is the patient high risk? Yes, patient taking a REMS drug     ? Does the patient require a Care Management Plan? No     ? Does the patient require physician intervention or other additional services (i.e. nutrition, smoking cessation, social work)? No      SHIPPING     Specialty Medication(s) to be Shipped:   Transplant:  mycophenolic acid 180mg  and tacrolimus 1mg     Other medication(s) to be shipped: n/a     Changes to insurance: No    Delivery Scheduled: Yes, Expected medication delivery date: 05/30/2019.     Medication will be delivered via UPS to the confirmed home address in [facility].    The patient will receive a drug information handout for each medication shipped and additional FDA Medication Guides as required.  Verified that patient has previously received a Conservation officer, historic buildings.    All of the patient's questions and concerns have been addressed.    Thad Ranger   [facility] Specialty Pharmacist

## 2019-05-23 MED ORDER — TAMOXIFEN 20 MG TABLET: 20 mg | tablet | Freq: Every day | 4 refills | 90 days | Status: AC

## 2019-05-25 DIAGNOSIS — D0512 Intraductal carcinoma in situ of left breast: Principal | ICD-10-CM

## 2019-05-25 MED ORDER — TAMOXIFEN 20 MG TABLET: 20 mg | tablet | Freq: Every day | 4 refills | 90 days | Status: AC

## 2019-05-29 MED FILL — TACROLIMUS 1 MG CAPSULE, IMMEDIATE-RELEASE: ORAL | 30 days supply | Qty: 240 | Fill #5

## 2019-05-29 MED FILL — TACROLIMUS 1 MG CAPSULE: 30 days supply | Qty: 240 | Fill #5 | Status: AC

## 2019-05-29 MED FILL — MYCOPHENOLATE SODIUM 180 MG TABLET,DELAYED RELEASE: ORAL | 30 days supply | Qty: 180 | Fill #5

## 2019-05-29 MED FILL — MYCOPHENOLATE SODIUM 180 MG TABLET,DELAYED RELEASE: 30 days supply | Qty: 180 | Fill #5 | Status: AC

## 2019-05-29 NOTE — Unmapped (Signed)
Left message, called to schedule follow up visit with Dr Carlene Coria and Recall letter mailed. Can be scheduled from recall tab. 10/26 EW

## 2019-06-09 ENCOUNTER — Other Ambulatory Visit: Payer: Self-pay

## 2019-06-09 ENCOUNTER — Emergency Department
Admission: EM | Admit: 2019-06-09 | Discharge: 2019-06-09 | Disposition: A | Discharge status: Home / Self Care | Payer: Medicare Other | Attending: Emergency Medicine | Admitting: Emergency Medicine

## 2019-06-09 ENCOUNTER — Encounter: Payer: Self-pay | Admitting: Emergency Medicine

## 2019-06-09 DIAGNOSIS — F1410 Cocaine abuse, uncomplicated: Secondary | ICD-10-CM | POA: Insufficient documentation

## 2019-06-09 DIAGNOSIS — Y92410 Unspecified street and highway as the place of occurrence of the external cause: Secondary | ICD-10-CM | POA: Diagnosis not present

## 2019-06-09 DIAGNOSIS — Y939 Activity, unspecified: Secondary | ICD-10-CM | POA: Diagnosis not present

## 2019-06-09 DIAGNOSIS — Y999 Unspecified external cause status: Secondary | ICD-10-CM | POA: Insufficient documentation

## 2019-06-09 DIAGNOSIS — I129 Hypertensive chronic kidney disease with stage 1 through stage 4 chronic kidney disease, or unspecified chronic kidney disease: Secondary | ICD-10-CM | POA: Diagnosis not present

## 2019-06-09 DIAGNOSIS — S39012A Strain of muscle, fascia and tendon of lower back, initial encounter: Secondary | ICD-10-CM | POA: Insufficient documentation

## 2019-06-09 DIAGNOSIS — Z940 Kidney transplant status: Secondary | ICD-10-CM | POA: Insufficient documentation

## 2019-06-09 DIAGNOSIS — Z79899 Other long term (current) drug therapy: Secondary | ICD-10-CM | POA: Insufficient documentation

## 2019-06-09 DIAGNOSIS — Z87891 Personal history of nicotine dependence: Secondary | ICD-10-CM | POA: Insufficient documentation

## 2019-06-09 DIAGNOSIS — Z7982 Long term (current) use of aspirin: Secondary | ICD-10-CM | POA: Diagnosis not present

## 2019-06-09 DIAGNOSIS — N183 Chronic kidney disease, stage 3 unspecified: Secondary | ICD-10-CM | POA: Diagnosis not present

## 2019-06-09 DIAGNOSIS — E1122 Type 2 diabetes mellitus with diabetic chronic kidney disease: Secondary | ICD-10-CM | POA: Insufficient documentation

## 2019-06-09 DIAGNOSIS — S3992XA Unspecified injury of lower back, initial encounter: Secondary | ICD-10-CM | POA: Diagnosis present

## 2019-06-09 MED ORDER — LIDOCAINE 5 % EX PTCH: 1.0000 | MEDICATED_PATCH | CUTANEOUS | 0 refills | Status: DC

## 2019-06-09 MED ORDER — METAXALONE 800 MG PO TABS: 800.0000 mg | ORAL_TABLET | Freq: Three times a day (TID) | ORAL | 0 refills | Status: AC
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
## 2019-06-14 ENCOUNTER — Other Ambulatory Visit: Payer: Self-pay | Admitting: Family Medicine

## 2019-06-15 NOTE — Telephone Encounter (Signed)
Pt states that the pharmacy is telling her they did not get this refill.

## 2019-06-15 NOTE — Telephone Encounter (Signed)
Called pharmacy. They did get the RX and it has been picked up by the patient.

## 2019-06-20 ENCOUNTER — Ambulatory Visit: Payer: Medicaid Other | Admitting: Pharmacist

## 2019-06-20 ENCOUNTER — Telehealth: Payer: Self-pay | Admitting: Pharmacist

## 2019-06-20 DIAGNOSIS — N183 Chronic kidney disease, stage 3 unspecified: Secondary | ICD-10-CM

## 2019-06-20 NOTE — Telephone Encounter (Signed)
Patient overdue for physical/comprehensive evaluation with Dr. Wynetta Emery. Patient noted that she isn't sure when she is/was due for follow up, and had been waiting for the office to call to schedule.   Dr. Wynetta Emery, when would you like this patient? In person vs virtual?

## 2019-06-20 NOTE — Chronic Care Management (AMB) (Signed)
  Chronic Care Management   Note  06/20/2019 Name: [name] MRN: [id number] DOB: January 16, 1966  [name] is a 53 y.o. year old female who is a primary care patient of Valerie Roys, DO. The CCM team was consulted for assistance with chronic disease management and care coordination needs.    Phone visit for medication management review scheduled with patient this morning. However, when I called her, she was in pain from a fall yesterday. Said she was dizzy and lost her balance, and feel onto her R rear with her R leg under her. Notes that it is sore this morning. She was not feeling up to a medication review. Encouraged patient that if pain worsens or fails to improve to contact the office to be seen.   Patient was due for comprehensive yearly physical in April. Will collaborate w/ office staff/Dr. Wynetta Emery to schedule patient.   Decided to postpone CCM medication review call until early January. Patient   Catie Darnelle Maffucci, PharmD Clinical Pharmacist [place] [phone]

## 2019-06-20 NOTE — Telephone Encounter (Signed)
Called pt scheduled cpe for 11/30

## 2019-06-20 NOTE — Telephone Encounter (Signed)
She was supposed to have a physical in April, but it was cancelled due to [place]. Please schedule in office physical ASAP

## 2019-06-20 NOTE — Unmapped (Signed)
[facility] Refill Coordination Note    Specialty Medication(s) to be Shipped:   Transplant: mycophenolate mofetil 180mg  and tacrolimus 1mg     Other medication(s) to be shipped: N/A     Nancy Bowman, DOB: Jan 04, 1966  Phone: [phone] (home)       All above HIPAA information was verified with patient.     Completed refill call assessment today to schedule patient's medication shipment from the [facility] [phone]).       Specialty medication(s) and dose(s) confirmed: Regimen is correct and unchanged.   Changes to medications: [name] reports no changes at this time.  Changes to insurance: No  Questions for the pharmacist: No    Confirmed patient received Welcome Packet with first shipment. The patient will receive a drug information handout for each medication shipped and additional FDA Medication Guides as required.       DISEASE/MEDICATION-SPECIFIC INFORMATION        N/A    SPECIALTY MEDICATION ADHERENCE     Medication Adherence    Patient reported X missed doses in the last month: 0  Specialty Medication: Mycophenolate 180mg   Patient is on additional specialty medications: Yes  Additional Specialty Medications: Tacrolimus 1mg   Patient Reported Additional Medication X Missed Doses in the Last Month: 0  Patient is on more than two specialty medications: No  Adherence tools used: patient uses a pill box to manage medications          Mycophenolate 180 mg: 8 days of medicine on hand   Tacrolimus 1 mg: 8 days of medicine on hand     SHIPPING     Shipping address confirmed in Epic.     Delivery Scheduled: Yes, Expected medication delivery date: 06/27/2019.     Medication will be delivered via UPS to the prescription address in Epic WAM.    Lorelei Pont [facility] Specialty Technician

## 2019-06-26 DIAGNOSIS — I10 Essential (primary) hypertension: Principal | ICD-10-CM

## 2019-06-26 MED ORDER — CARVEDILOL 12.5 MG TABLET
ORAL_TABLET | Freq: Two times a day (BID) | ORAL | 11 refills | 30.00000 days | Status: CP
Start: 2019-06-26 — End: 2020-06-26

## 2019-06-26 MED FILL — MYCOPHENOLATE SODIUM 180 MG TABLET,DELAYED RELEASE: ORAL | 30 days supply | Qty: 180 | Fill #6

## 2019-06-26 MED FILL — MYCOPHENOLATE SODIUM 180 MG TABLET,DELAYED RELEASE: 30 days supply | Qty: 180 | Fill #6 | Status: AC

## 2019-06-26 MED FILL — TACROLIMUS 1 MG CAPSULE, IMMEDIATE-RELEASE: ORAL | 30 days supply | Qty: 240 | Fill #6

## 2019-06-26 MED FILL — TACROLIMUS 1 MG CAPSULE: 30 days supply | Qty: 240 | Fill #6 | Status: AC

## 2019-06-26 NOTE — Unmapped (Signed)
Patient paged on call.  Called patient back and went to VM, left message for her to call back or call her coordinator. Updated Primary coordinator      Patient called back and she needs a refill on her coreg sent to [facility].  Refill sent in

## 2019-06-28 ENCOUNTER — Other Ambulatory Visit: Payer: Self-pay

## 2019-07-03 ENCOUNTER — Encounter: Payer: Medicaid Other | Admitting: Family Medicine

## 2019-07-12 ENCOUNTER — Other Ambulatory Visit: Payer: Self-pay | Admitting: Family Medicine

## 2019-07-14 DIAGNOSIS — E872 Acidosis: Principal | ICD-10-CM

## 2019-07-14 DIAGNOSIS — Z940 Kidney transplant status: Principal | ICD-10-CM

## 2019-07-14 DIAGNOSIS — N186 End stage renal disease: Principal | ICD-10-CM

## 2019-07-14 MED ORDER — SODIUM BICARBONATE 650 MG TABLET
ORAL_TABLET | 11 refills | 0 days | Status: CP
Start: 2019-07-14 — End: ?

## 2019-07-20 ENCOUNTER — Other Ambulatory Visit: Payer: Self-pay | Admitting: Family Medicine

## 2019-07-20 NOTE — Unmapped (Signed)
[facility] Refill Coordination Note    Specialty Medication(s) to be Shipped:   Transplant: Myfortic 180mg  and tacrolimus 1mg     Other medication(s) to be shipped: n/a     Nancy Bowman, DOB: 06/29/1966  Phone: [phone] (home)       All above HIPAA information was verified with patient.     Was a Nurse, learning disability used for this call? No    Completed refill call assessment today to schedule patient's medication shipment from the [facility] [phone]).       Specialty medication(s) and dose(s) confirmed: Regimen is correct and unchanged.   Changes to medications: [name] reports no changes at this time.  Changes to insurance: No  Questions for the pharmacist: No    Confirmed patient received Welcome Packet with first shipment. The patient will receive a drug information handout for each medication shipped and additional FDA Medication Guides as required.       DISEASE/MEDICATION-SPECIFIC INFORMATION        N/A    SPECIALTY MEDICATION ADHERENCE     Medication Adherence    Patient reported X missed doses in the last month: 0  Specialty Medication: myfortic  Patient is on additional specialty medications: Yes  Additional Specialty Medications: tacrolimus  Patient Reported Additional Medication X Missed Doses in the Last Month: 0  Patient is on more than two specialty medications: No  Any gaps in refill history greater than 2 weeks in the last 3 months: no  Demonstrates understanding of importance of adherence: yes  Informant: patient  Adherence tools used: patient uses a pill box to manage medications                mycophenolate 180mg : Patient has 14 days of medication on hand    Tacrolimus 1mg : Patient has 14 days of medication on hand      SHIPPING     Shipping address confirmed in Epic.     Delivery Scheduled: Yes, Expected medication delivery date: 08/01/19.     Medication will be delivered via UPS to the prescription address in Epic WAM.    Nancy Bowman [facility] Specialty Technician

## 2019-07-31 MED FILL — TACROLIMUS 1 MG CAPSULE: 30 days supply | Qty: 240 | Fill #7 | Status: AC

## 2019-07-31 MED FILL — MYCOPHENOLATE SODIUM 180 MG TABLET,DELAYED RELEASE: 30 days supply | Qty: 180 | Fill #7 | Status: AC

## 2019-07-31 MED FILL — MYCOPHENOLATE SODIUM 180 MG TABLET,DELAYED RELEASE: ORAL | 30 days supply | Qty: 180 | Fill #7

## 2019-07-31 MED FILL — TACROLIMUS 1 MG CAPSULE, IMMEDIATE-RELEASE: ORAL | 30 days supply | Qty: 240 | Fill #7

## 2019-08-06 ENCOUNTER — Other Ambulatory Visit: Payer: Self-pay | Admitting: Family Medicine

## 2019-08-09 ENCOUNTER — Ambulatory Visit: Payer: Self-pay | Admitting: Pharmacist

## 2019-08-09 ENCOUNTER — Telehealth: Payer: Self-pay

## 2019-08-09 NOTE — Chronic Care Management (AMB) (Signed)
  Chronic Care Management   Note  08/09/2019 Name: [name] MRN: [id number] DOB: 1966/07/13  [name] is a 54 y.o. year old female who is a primary care patient of Valerie Roys, DO. The CCM team was consulted for assistance with chronic disease management and care coordination needs by the patient's insurance company.     Attempted to contact patient to discuss CCM program. Left HIPAA compliant message for patient to give me a call if she was interested in working together. Due to multiple unsuccessful outreach attempts, will cease attempting to engage.   Follow up plan: - CCM team would be happy to re-engage this patient in the future, if she returns call or we receive a new referral from PCP   Catie Darnelle Maffucci, PharmD, [place] [phone]

## 2019-08-14 NOTE — Unmapped (Signed)
left message message, called to reschedule 1/8 No Show appt w/Dr True. Can be scheduled from the recall tab

## 2019-08-23 NOTE — Unmapped (Signed)
[facility] Refill Coordination Note    Specialty Medication(s) to be Shipped:   Transplant:  mycophenolic acid 180mg  and tacrolimus 1mg     Other medication(s) to be shipped: none     Nancy Bowman, DOB: [birth year]  Phone: [phone] (home)       All above HIPAA information was verified with patient.     Was a Nurse, learning disability used for this call? No    Completed refill call assessment today to schedule patient's medication shipment from the [facility] [phone]).       Specialty medication(s) and dose(s) confirmed: Regimen is correct and unchanged.   Changes to medications: [name] reports no changes at this time.  Changes to insurance: No  Questions for the pharmacist: No    Confirmed patient received Welcome Packet with first shipment. The patient will receive a drug information handout for each medication shipped and additional FDA Medication Guides as required.       DISEASE/MEDICATION-SPECIFIC INFORMATION        N/A    SPECIALTY MEDICATION ADHERENCE     Medication Adherence    Patient reported X missed doses in the last month: 0  Adherence tools used: patient uses a pill box to manage medications           Tacrolimus 1mg : 10 days on hand   Mycophenolate 180mg : 10 days on hand             SHIPPING     Shipping address confirmed in Epic.     Delivery Scheduled: Yes, Expected medication delivery date: 08/29/19.     Medication will be delivered via UPS to the prescription address in Epic WAM.    [place] A Nancy Bowman   [facility] Specialty Technician

## 2019-08-24 NOTE — Unmapped (Deleted)
{** REMINDER - THIS NOTE IS NOT A FINAL MEDICAL RECORD UNTIL IT IS SIGNED.?? UNTIL THEN, THE CONTENT BELOW MAY REFLECT INFORMATION FROM A DOCUMENTATION TEMPLATE, NOT THE ACTUAL PATIENT VISIT. **}  Transplant Nephrology Clinic Visit      History of Present Illness    Patient is a 54 y.o. 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  She was on dialysis approximately 7 years prior to this transplant, the donor kidney had a KDPI of 65%.  She did have issues with hyperkalemia while on dialysis, and was on Kayexalate weekly.  Her post transplant course has been without rejection or recurrent disease. Patient does have evidence of a donor specific antibody to DP01 at an MFI of 2294 (stable) on 07/08/2016. Her most recent creatinine has ranged between 1.6 and 2.4.    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     In December 2016, she underwent routine mammogram that was abnormal. Follow-up mammogram in January was again highly suggestive of significant lesion in the left breast. She underwent core biopsy on 09/09/2015 which showed ductal carcinoma in situ. She subsequently underwent a partial mastectomy on 09/24/2015. 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 That final pathology showed a positive medial margin, so she underwent reexcision on 10/25/2015 for which the pathology was negative. Her surgical management was felt to be complete.     The patient was hospitalized in [place] in the beginning of January for 2-3 days with sinus congestion, shortness of breath on exertion, and scant hemoptysis. She was diagnosed with URI and treated with Levaquin. Her symptoms have improved.    Patient was admitted from 1/6 through 08/09/18 for chest pain. Serial troponins negative, echo unremarkable with EF 65-70%.     She was again admitted 1/19 through 08/24/18, after presenting with several days of left lower abdominal pain. CT notable for diverticulitis with leukocytosis on labs. She was treated with IV ciprofloxacin and Flagyl during admission. Discharged home on 8 day course of PO cipro and flagyl. Creatinine remained stable during admission.     Interval history since last seen on 02/16/2019:      At her last visit, I had intended to see her back in 6 months. This appointment was a follow up from a remote appointment, I had not intended to see her back so quickly.    Fortunately, she is feeling well. She reports some decreased appetite in the morning that she associates with her diabetes meds. She tries to eat a small meal in the morning and then larger ones later. No abdominal pain, nausea or vomiting. Weight has been stable. She continues to report good blood pressure control, 120s/70s.     She specifically denies fever, myalgias, upper respiratory/GI symptoms, or change in taste/smell.     Review of Systems    Otherwise on review of systems patient denies fever or chills, chest pain, SOB, PND or orthopnea, lower extremity edema.  Denies abdominal pain.  No dysuria, hematuria or difficulty voiding. Bowel movements normal.  Denies joint pain or rash.  All other systems are reviewed and are negative.    Medications    Current Outpatient Medications   Medication Sig Dispense Refill   ??? aspirin 81 MG chewable tablet Chew 1 tablet (81 mg total) daily. 30 tablet 11   ??? atorvastatin (LIPITOR) 40 MG tablet Take 1 tablet (  40 mg total) by mouth daily. 30 tablet 11   ??? carvediloL (COREG) 12.5 MG tablet Take 1 tablet (12.5 mg total) by mouth Two (2) times a day. 60 tablet 11   ??? cetirizine (ZYRTEC) 5 MG chewable tablet Chew 5 mg daily.     ??? cholecalciferol, vitamin D3, (VITAMIN D3) 1,000 unit capsule Take 1,000 Units by mouth daily.     ??? fluticasone (FLONASE) 50 mcg/actuation nasal spray Two sprays each nare twice a day. 16 g 5   ??? gabapentin (NEURONTIN) 300 MG capsule Take 1 capsules (300 mg) by mouth in the morning and at noon, 600 mg in the evening. 360 capsule 11   ??? glipiZIDE (GLUCOTROL) 5 MG tablet Take 1 tablet (5 mg total) by mouth daily. 30 tablet 11   ??? levETIRAcetam (KEPPRA) 500 MG tablet Take 1 tablet (500 mg total) by mouth Two (2) times a day. 60 tablet 11   ??? magnesium oxide (MAG-OX) 400 mg (241.3 mg magnesium) tablet Take 400 mg by mouth daily.     ??? miscellaneous medical supply Misc 1 knee brace 1 each 0   ??? mycophenolate (MYFORTIC) 180 MG EC tablet Take 2 tablets (360 mg total) by mouth Three (3) times a day. 180 tablet 11   ??? omeprazole (PRILOSEC) 20 MG capsule Take 1 capsule (20 mg total) by mouth two (2) times a day. 180 capsule 3   ??? sodium bicarbonate 650 mg tablet Take 2 tablets (1300 mg) three times a day. 180 tablet 11   ??? tacrolimus (PROGRAF) 1 MG capsule Take 4 capsules (4 mg total) by mouth two (2) times a day. 240 capsule 11   ??? tamoxifen (NOLVADEX) 20 MG tablet Take 1 tablet (20 mg total) by mouth daily. 90 tablet 4   ??? traMADoL (ULTRAM) 50 mg tablet Take 1 tablet (50 mg total) by mouth every eight (8) hours as needed for pain. 60 tablet 2   ??? valACYclovir (VALTREX) 500 MG tablet Take 500 mg by mouth daily as needed.       No current facility-administered medications for this visit.        Physical Exam  LMP 09/04/2015   General: Patient is a pleasant female in {Pain Distress:3805990364::no apparent distress}.  Eyes: Sclera {eyes:21926}.  ENT: Oropharynx without lesions.   Neck: {neck:21927}.  Lungs: {lungs:21928}.  Cardiovascular: {cardiovascular:21929}.  Abdomen: {KAT transplant abdomen:31800}.  Extremities: {extremities:21931}.  Skin: {skin:21932}.  Neurological: {neuro:21933}.  Psychiatric: {psych:21936}.      Laboratory Results    Lab Results   Component Value Date    WBC 7.7 08/31/2018    RBC 3.41 (L) 08/31/2018    HGB 10.7 (L) 08/31/2018    HCT 32.9 (L) 08/31/2018    MCV 96.5 08/31/2018    MCH 31.3 08/31/2018    MCHC 32.4 08/31/2018    RDW 13.2 08/31/2018    PLT 297 08/31/2018     Lab Results   Component Value Date    NA 139 08/31/2018    K 5.1 (H) 08/31/2018    CL 106 08/31/2018    CO2 19.0 (L) 08/31/2018    BUN 37 (H) 08/31/2018    CREATININE 2.39 (H) 08/31/2018    GLU 121 08/31/2018    CALCIUM 10.9 (H) 08/31/2018    CALCIUM 10.9 (H) 08/31/2018    ALBUMIN 4.2 08/31/2018    PHOS 4.4 08/31/2018     Lab Results   Component Value Date    A1C 6.8 (H) 08/31/2018  Lab Results   Component Value Date    ALKPHOS 44 08/31/2018    BILITOT 0.2 08/31/2018    BILIDIR 0.30 03/04/2016    PROT 7.6 08/31/2018    ALBUMIN 4.2 08/31/2018    ALT 14 08/31/2018    AST 20 08/31/2018     Lab Results   Component Value Date    CHOL 133 08/31/2018     Lab Results   Component Value Date    HDL 27 (L) 08/31/2018     No components found for: LDLCALC,  LDL,  LDLDIRECT  Lab Results   Component Value Date    TRIG 394 (H) 08/31/2018     No components found for: [facility]  Lab Results   Component Value Date    PT 9.6 (L) 09/21/2011    INR 1.0 03/23/2014    APTT 52.7 (H) 03/27/2014     Lab Results   Component Value Date    PTH 68.9 08/31/2018       Assessment and Plan    1. Status post renal transplant. Her last creatinine was within her baseline at 2.34. Her prograf trough was appropriate at 7.1. Continue current immunosuppression.    2. Breast cancer status post resection. On tamoxifen and tolerating that well. Had been scheduled for regular oncology follow up and mammography screening in June, has not been done yet, presumed due to covid.     3. History of hyperkalemia. Last potassium was normal at 4.8. She has been educated on low potassium diet. Would not treat unless > 6.    4. Seasonal allergies. On Flonase nasal spay, instructed to take Claritin or Zyrtec if needed.     5. Metabolic acidosis. HCO3 only mildly decreased at 19, currently on supplementation.     6. History of seizure disorder. Patient denies seizure activity and continues on Keppra.     7. Hypertension. Blood pressure controlled on current regimen.     8. Chronic lower back and leg pain. Stable.     9. Diabetes mellitus. Hgb A1c 6.8 in January 2020. Managed elsewhere. Metformin was discontinued during recent admission 08/2018 and she was transitioned to Glipizide 5 mg daily. Reports good glucose control, though her nonfasting glucose on last labs was 199.    10. Elevated LDL of 103 in 08/2017. Continues to trend down, 27 on last labs. On atorvastatin. Will continue to follow closely.     11. Health maintenance. She received a flu shot this past fall. Still needs Shingrix vaccine. We discussed the importance of social distancing, wearing a mask outside the home, and hand washing/sanitizing. We discussed that this is particularly important for transplant patients who are at risk for complications from coronavirus.    12. Will see patient back in 6 months, or sooner if needed.

## 2019-08-25 DIAGNOSIS — Z940 Kidney transplant status: Principal | ICD-10-CM

## 2019-08-25 DIAGNOSIS — Z79899 Other long term (current) drug therapy: Principal | ICD-10-CM

## 2019-08-28 MED FILL — TACROLIMUS 1 MG CAPSULE, IMMEDIATE-RELEASE: ORAL | 30 days supply | Qty: 240 | Fill #8

## 2019-08-28 MED FILL — MYCOPHENOLATE SODIUM 180 MG TABLET,DELAYED RELEASE: 30 days supply | Qty: 180 | Fill #8 | Status: AC

## 2019-08-28 MED FILL — MYCOPHENOLATE SODIUM 180 MG TABLET,DELAYED RELEASE: ORAL | 30 days supply | Qty: 180 | Fill #8

## 2019-08-28 MED FILL — TACROLIMUS 1 MG CAPSULE: 30 days supply | Qty: 240 | Fill #8 | Status: AC

## 2019-09-07 MED ORDER — BLOOD SUGAR DIAGNOSTIC STRIPS
Freq: Two times a day (BID) | 11 refills | 0 days | Status: CP
Start: 2019-09-07 — End: ?

## 2019-09-07 MED ORDER — BLOOD-GLUCOSE METER KIT WRAPPER
0 refills | 0 days | Status: CP
Start: 2019-09-07 — End: 2020-09-06

## 2019-09-18 ENCOUNTER — Other Ambulatory Visit: Payer: Self-pay | Admitting: Family Medicine

## 2019-09-19 NOTE — Unmapped (Signed)
[facility] Clinical Assessment & Refill Coordination Note    Nancy Bowman, DOB: 1965-11-01  Phone: [phone] (home)     All above HIPAA information was verified with patient.     Was a Nurse, learning disability used for this call? 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 omeprazole (PRILOSEC) 20 MG capsule Take 1 capsule (20 mg total) by mouth two (2) times a day. (Patient taking differently: Take 20 mg by mouth two (2) times a day. prn) 180 capsule 3   ??? aspirin 81 MG chewable tablet Chew 1 tablet (81 mg total) daily. 30 tablet 11   ??? atorvastatin (LIPITOR) 40 MG tablet Take 1 tablet (40 mg total) by mouth daily. 30 tablet 11   ??? blood sugar diagnostic Strp by Other route two (2) times a day. 100 each 11   ??? blood-glucose meter kit Use as instructed 1 each 0   ??? carvediloL (COREG) 12.5 MG tablet Take 1 tablet (12.5 mg total) by mouth Two (2) times a day. 60 tablet 11   ??? cetirizine (ZYRTEC) 5 MG chewable tablet Chew 5 mg daily.     ??? cholecalciferol, vitamin D3, (VITAMIN D3) 1,000 unit capsule Take 1,000 Units by mouth daily.     ??? fluticasone (FLONASE) 50 mcg/actuation nasal spray Two sprays each nare twice a day. 16 g 5   ??? gabapentin (NEURONTIN) 300 MG capsule Take 1 capsules (300 mg) by mouth in the morning and at noon, 600 mg in the evening. 360 capsule 11   ??? glipiZIDE (GLUCOTROL) 5 MG tablet Take 1 tablet (5 mg total) by mouth daily. 30 tablet 11   ??? levETIRAcetam (KEPPRA) 500 MG tablet Take 1 tablet (500 mg total) by mouth Two (2) times a day. 60 tablet 11   ??? magnesium oxide (MAG-OX) 400 mg (241.3 mg magnesium) tablet Take 400 mg by mouth daily.     ??? miscellaneous medical supply Misc 1 knee brace 1 each 0   ??? mycophenolate (MYFORTIC) 180 MG EC tablet Take 2 tablets (360 mg total) by mouth Three (3) times a day. 180 tablet 11   ??? sodium bicarbonate 650 mg tablet Take 2 tablets (1300 mg) three times a day. 180 tablet 11   ??? tacrolimus (PROGRAF) 1 MG capsule Take 4 capsules (4 mg total) by mouth two (2) times a day. 240 capsule 11   ??? tamoxifen (NOLVADEX) 20 MG tablet Take 1 tablet (20 mg total) by mouth daily. 90 tablet 4   ??? traMADoL (ULTRAM) 50 mg tablet Take 1 tablet (50 mg total) by mouth every eight (8) hours as needed for pain. 60 tablet 2   ??? valACYclovir (VALTREX) 500 MG tablet Take 500 mg by mouth daily as needed.       No current facility-administered medications for this visit.         Changes to medications: Nancy Bowman reports no changes at this time.    Allergies   Allergen Reactions   ??? 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 Ibuprofen      Can't take due to kidneys   ??? Penicillins      Patient is unsure if she's allergic, was told she had a reaction as a child but thinks she's taking a cillin while sick without having a reaction.  Pt states she has taken amoxicillin in the past and did not have problem       Changes to allergies: No    SPECIALTY MEDICATION ADHERENCE     Mycophenolate 180mg   : 7 days of medicine on hand   Tacrolimus 1mg   : 7 days of medicine on hand      Medication Adherence    Patient reported X missed doses in the last month: 0  Specialty Medication: mycophenolate 180mg   Patient is on additional specialty medications: Yes  Additional Specialty Medications: Tacrolimus 1mg   Patient Reported Additional Medication X Missed Doses in the Last Month: 0  Adherence tools used: patient uses a pill box to manage medications          Specialty medication(s) dose(s) confirmed: Regimen is correct and unchanged.     Are there any concerns with adherence? No    Adherence counseling provided? Not needed    CLINICAL MANAGEMENT AND INTERVENTION      Clinical Benefit Assessment:    Do you feel the medicine is effective or helping your condition? Yes    Clinical Benefit counseling provided? Not needed    Adverse Effects Assessment:    Are you experiencing any side effects? No    Are you experiencing difficulty administering your medicine? No    Quality of Life Assessment:    How many days over the past month did your transplant  keep you from your normal activities? For example, brushing your teeth or getting up in the morning. 0    Have you discussed this with your provider? Not needed    Therapy Appropriateness:    Is therapy appropriate? Yes, therapy is appropriate and should be continued    DISEASE/MEDICATION-SPECIFIC INFORMATION      N/A    PATIENT SPECIFIC NEEDS     ? Does the patient have any physical, cognitive, or cultural barriers? No    ? Is the patient high risk? Yes, patient is taking a REMS drug. Medication is dispensed in compliance with REMS program.     ? Does the patient require a Care Management Plan? No     ? Does the patient require physician intervention or other additional services (i.e. nutrition, smoking cessation, social work)? No      SHIPPING     Specialty Medication(s) to be Shipped:   Transplant:  mycophenolic acid 180mg  and tacrolimus 1mg     Other medication(s) to be shipped: na     Changes to insurance: No    Delivery Scheduled: Yes, Expected medication delivery date: 09/22/2019.     Medication will be delivered via Same Day Courier to the confirmed prescription address in [facility].    The patient will receive a drug information handout for each medication shipped and additional FDA Medication Guides as required.  Verified that patient has previously received a Conservation officer, historic buildings.    All of the patient's questions and concerns have been addressed.    Thad Ranger   [facility] Specialty Pharmacist

## 2019-09-22 MED FILL — MYCOPHENOLATE SODIUM 180 MG TABLET,DELAYED RELEASE: 30 days supply | Qty: 180 | Fill #9 | Status: AC

## 2019-09-22 MED FILL — MYCOPHENOLATE SODIUM 180 MG TABLET,DELAYED RELEASE: ORAL | 30 days supply | Qty: 180 | Fill #9

## 2019-09-22 MED FILL — TACROLIMUS 1 MG CAPSULE, IMMEDIATE-RELEASE: ORAL | 30 days supply | Qty: 240 | Fill #9

## 2019-09-22 MED FILL — TACROLIMUS 1 MG CAPSULE: 30 days supply | Qty: 240 | Fill #9 | Status: AC

## 2019-10-06 ENCOUNTER — Encounter: Admit: 2019-10-06 | Discharge: 2019-10-07 | Payer: MEDICARE | Attending: Nephrology | Primary: Nephrology

## 2019-10-06 DIAGNOSIS — Z940 Kidney transplant status: Secondary | ICD-10-CM | POA: Diagnosis not present

## 2019-10-06 DIAGNOSIS — E875 Hyperkalemia: Secondary | ICD-10-CM | POA: Diagnosis not present

## 2019-10-06 DIAGNOSIS — D849 Immunodeficiency, unspecified: Secondary | ICD-10-CM | POA: Diagnosis not present

## 2019-10-06 DIAGNOSIS — E785 Hyperlipidemia, unspecified: Secondary | ICD-10-CM | POA: Diagnosis not present

## 2019-10-06 DIAGNOSIS — E872 Acidosis: Secondary | ICD-10-CM | POA: Diagnosis not present

## 2019-10-06 NOTE — Unmapped (Signed)
Transplant Nephrology Phone Visit      Primary Care Physician: Olevia Perches, DO    History of Present Illness    Patient is a 54 y.o. 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  She was on dialysis approximately 7 years prior to this transplant, the donor kidney had a KDPI of 65%.  She did have issues with hyperkalemia while on dialysis, and was on Kayexalate weekly.  Her post transplant course has been without rejection or recurrent disease. Patient does have evidence of a donor specific antibody to DP01 at an MFI of 2294 (stable) on 07/08/2016. Her most recent creatinine has ranged between 1.6 and 2.4.    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     In December 2016, she underwent routine mammogram that was abnormal. Follow-up mammogram in January was again highly suggestive of significant lesion in the left breast. She underwent core biopsy on 09/09/2015 which showed ductal carcinoma in situ. She subsequently underwent a partial mastectomy on 09/24/2015. 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 That final pathology showed a positive medial margin, so she underwent reexcision on 10/25/2015 for which the pathology was negative. Her surgical management was felt to be complete.     The patient was hospitalized in [place] in the beginning of January for 2-3 days with sinus congestion, shortness of breath on exertion, and scant hemoptysis. She was diagnosed with URI and treated with Levaquin. Her symptoms have improved.    Patient was admitted from 1/6 through 08/09/18 for chest pain. Serial troponins negative, echo unremarkable with EF 65-70%.     She was again admitted 1/19 through 08/24/18, after presenting with several days of left lower abdominal pain. CT notable for diverticulitis with leukocytosis on labs. She was treated with IV ciprofloxacin and Flagyl during admission. Discharged home on 8 day course of PO cipro and flagyl. Creatinine remained stable during admission.     Interval history since last seen on 02/16/2019:    Her visit today is conducted via phone due to the ongoing COVID-19 pandemic. I was located in my office in the [facility] building (on campus) during this visit.    She contacted the on call coordinator on 04/02/19 reporting that she was out of Keppra. Reported that her mother had a stroke at the beginning of August and she forgot to get her refill with everything going on. Refill was sent. Called on 10/5 asking for gabapentin refill. She does have a PCP but was unable to get up with them so called [place].    Called the oncology office 04/04/19 reporting a fall on 8/5. Since that fall she had noticed a lump in her right breast. That resolved spontaneously.    She has not had labs drawn since 02/10/19.    She notes that her appetite is improved. Her blood pressure has been 118-120/70s. Reports sugars pretty good.    She received the first dose of the Moderna vaccine on 09/27/19 and tolerated it without difficulty.    She specifically denies fever, myalgias, upper respiratory/GI symptoms, or change in taste/smell.     Review of Systems    Otherwise on review of systems patient denies fever or chills, chest pain, SOB, PND or orthopnea, lower extremity edema.  Denies abdominal pain.  No dysuria, hematuria or difficulty voiding. Bowel movements normal.  Denies joint pain or rash.  All other systems are reviewed and are negative.    Medications    Current Outpatient Medications   Medication Sig Dispense Refill   ??? aspirin 81 MG chewable tablet Chew 1 tablet (81 mg total) daily. 30 tablet 11   ??? atorvastatin (LIPITOR) 40 MG tablet Take 1 tablet (40 mg total) by mouth daily. 30 tablet 11   ??? blood sugar diagnostic Strp by Other route two (2) times a day. 100 each 11   ??? blood-glucose meter kit Use as instructed 1 each 0   ??? carvediloL (COREG) 12.5 MG tablet Take 1 tablet (12.5 mg total) by mouth Two (2) times a day. 60 tablet 11   ??? cetirizine (ZYRTEC) 5 MG chewable tablet Chew 5 mg daily.     ??? cholecalciferol, vitamin D3, (VITAMIN D3) 1,000 unit capsule Take 1,000 Units by mouth daily.     ??? fluticasone (FLONASE) 50 mcg/actuation nasal spray Two sprays each nare twice a day. 16 g 5   ??? gabapentin (NEURONTIN) 300 MG capsule Take 1 capsules (300 mg) by mouth in the morning and at noon, 600 mg in the evening. (Patient taking differently: Take 600 mg by mouth Three (3) times a day. Take 1 capsules (300 mg) by mouth in the morning and at noon, 600 mg in the evening.) 360 capsule 11   ??? glipiZIDE (GLUCOTROL) 5 MG tablet Take 1 tablet (5 mg total) by mouth daily. 30 tablet 11   ??? levETIRAcetam (KEPPRA) 500 MG tablet Take 1 tablet (500 mg total) by mouth Two (2) times a day. 60 tablet 11   ??? magnesium oxide (MAG-OX) 400 mg (241.3 mg magnesium) tablet Take 400 mg by mouth daily.     ??? miscellaneous medical supply Misc 1 knee brace 1 each 0   ??? mycophenolate (MYFORTIC) 180 MG EC tablet Take 2 tablets (360 mg total) by mouth Three (3) times a day. 180 tablet 11   ??? omeprazole (PRILOSEC) 20 MG capsule Take 1 capsule (20 mg total) by mouth two (2) times a day. (Patient taking differently: Take 20 mg by mouth two (2) times a day. prn) 180 capsule 3   ??? sodium bicarbonate 650 mg tablet Take 2 tablets (1300 mg) three times a day. 180 tablet 11   ??? tacrolimus (PROGRAF) 1 MG capsule Take 4 capsules (4 mg total) by mouth two (2) times a day. 240 capsule 11   ??? tamoxifen (NOLVADEX) 20 MG tablet Take 1 tablet (20 mg total) by mouth daily. 90 tablet 4   ??? traMADoL (ULTRAM) 50 mg tablet Take 1 tablet (50 mg total) by mouth every eight (8) hours as needed for pain. 60 tablet 2   ??? valACYclovir (VALTREX) 500 MG tablet Take 500 mg by mouth daily as needed.       No current facility-administered medications for this visit.        Physical Exam    General: She is in no apparent distress.  Lungs: Speaking in complete sentences without audible wheeze.  Neurological: Oriented x 3.  Psychiatric: Mood and affect appropriate.      Laboratory Results    From Care Everywhere 02/10/19: Na 134, K 4.8, Cl 105, CO2 19, BUN 42, Cr 2.34, glu 199, Mg 1.7, Phos 3.2, wbc 7.6, Hgb 12.3, plt 221, tac 7.1.    Assessment and Plan    1. Status post renal transplant. Her last creatinine was within her baseline at 2.34, though has not had labs since July 2020. Her  prograf trough of 7.1 was at goal of 6-8. Will continue current immunosuppression for now, have asked her to get labs with an appropriate trough next week.    2. Breast cancer status post resection. On tamoxifen and tolerating that well. Had been scheduled for regular oncology follow up and mammography screening in June, but was postponed due to covid. She has not yet rescheduled, was encouraged to do so.    3. History of hyperkalemia. Last potassium was normal at 4.8. She has been educated on low potassium diet. Would not treat unless > 6.    4. Metabolic acidosis. HCO3 only mildly decreased at 19, currently on supplementation.     5. History of seizure disorder. Patient denies seizure activity and continues on Keppra.     6. Hypertension. Reports blood pressure controlled on current regimen.     7. Chronic lower back and leg pain. Stable.     9. Diabetes mellitus. Hgb A1c 6.8 in January 2020. Managed elsewhere. Metformin was discontinued during admission 08/2018 and she was transitioned to Glipizide 5 mg daily. Reports good glucose control.    10. Elevated LDL of 103 in 08/2017. Continues to trend down, 27 on last labs. On atorvastatin. Will continue to follow closely.     11. Health maintenance. She received a flu shot this past fall. Still needs Shingrix and pneumococcal vaccines.     She received the first dose of the Moderna vaccine on 09/27/19.    We discussed the importance of social distancing, wearing a mask outside the home, and hand washing/sanitizing (even if vaccinated). We discussed that this is particularly important for transplant patients who are at risk for complications from coronavirus.    12. Will see patient back in 6 months, or sooner if needed.       I spent 27 minutes on the phone with the patient on the date of service. I spent an additional 12 minutes on pre- and post-visit activities on the date of service.     The patient was physically located in [place] or a state in which I am permitted to provide care. The patient and/or parent/guardian understood that s/he may incur co-pays and cost sharing, and agreed to the telemedicine visit. The visit was reasonable and appropriate under the circumstances given the patient's presentation at the time.    The patient and/or parent/guardian has been advised of the potential risks and limitations of this mode of treatment (including, but not limited to, the absence of in-person examination) and has agreed to be treated using telemedicine. The patient's/patient's family's questions regarding telemedicine have been answered.     If the visit was completed in an ambulatory setting, the patient and/or parent/guardian has also been advised to contact their provider???s office for worsening conditions, and seek emergency medical treatment and/or call 911 if the patient deems either necessary.

## 2019-10-19 ENCOUNTER — Ambulatory Visit: Payer: Medicare Other | Attending: Radiation Oncology | Admitting: Radiation Oncology

## 2019-10-19 NOTE — Unmapped (Signed)
[facility] Refill Coordination Note  Medication: tacrolimus, myfortic    Unable to reach patient to schedule shipment for medication being filled at [facility]. Mailbox is full so unable to leave message.  As this is the 3rd unsuccessful attempt to reach the patient, no additional phone call attempts will be made at this time.      Phone numbers attempted: [phone]  Last scheduled delivery: 09/22/19    Please call the [facility] at [phone] (option [phone]) should you have any further questions.      Thanks,  [facility] Specialty Team

## 2019-11-04 ENCOUNTER — Other Ambulatory Visit: Payer: Self-pay | Admitting: Family Medicine

## 2019-11-04 NOTE — Telephone Encounter (Signed)
Requested Prescriptions  Pending Prescriptions Disp Refills  . fluticasone (FLONASE) 50 MCG/ACT nasal spray [Pharmacy Med Name: FLUTICASONE PROPIONATE NS] 16 g 0    Sig: Two sprays each nare twice a day.     Ear, Nose, and Throat: Nasal Preparations - Corticosteroids Passed - 11/04/2019 12:46 PM      Passed - Valid encounter within last 12 months    Recent Outpatient Visits          5 months ago Upper respiratory tract infection, unspecified type   [facility] Volney American, [place]   1 year ago Upper respiratory tract infection, unspecified type   [facility], Lilia Argue, [place]   1 year ago Pain, dental   [place], [place], DO   1 year ago Diverticulitis   [place], Megan P, DO   1 year ago Atypical chest pain   [place], [place], DO

## 2019-11-13 ENCOUNTER — Other Ambulatory Visit: Payer: Self-pay | Admitting: Family Medicine

## 2019-11-23 ENCOUNTER — Other Ambulatory Visit: Payer: Self-pay | Admitting: Family Medicine

## 2019-11-24 NOTE — Telephone Encounter (Signed)
Requested  medications are  due for refill today yes  Requested medications are on the active medication list yes  Last refill 1/3  Future visit scheduled No  Notes to clinic Failed protocol, no visit in over 12 months

## 2019-12-05 ENCOUNTER — Observation Stay: Payer: Medicare Other

## 2019-12-05 ENCOUNTER — Other Ambulatory Visit: Payer: Self-pay

## 2019-12-05 ENCOUNTER — Emergency Department: Payer: Medicare Other

## 2019-12-05 ENCOUNTER — Observation Stay
Admission: EM | Admit: 2019-12-05 | Discharge: 2019-12-06 | Disposition: A | Discharge status: Home / Self Care | Payer: Medicare Other | Attending: Internal Medicine | Admitting: Internal Medicine

## 2019-12-05 DIAGNOSIS — J40 Bronchitis, not specified as acute or chronic: Secondary | ICD-10-CM

## 2019-12-05 DIAGNOSIS — Z7984 Long term (current) use of oral hypoglycemic drugs: Secondary | ICD-10-CM | POA: Diagnosis not present

## 2019-12-05 DIAGNOSIS — R059 Cough, unspecified: Secondary | ICD-10-CM

## 2019-12-05 DIAGNOSIS — G40909 Epilepsy, unspecified, not intractable, without status epilepticus: Secondary | ICD-10-CM | POA: Diagnosis not present

## 2019-12-05 DIAGNOSIS — Z20822 Contact with and (suspected) exposure to covid-19: Secondary | ICD-10-CM | POA: Insufficient documentation

## 2019-12-05 DIAGNOSIS — K219 Gastro-esophageal reflux disease without esophagitis: Secondary | ICD-10-CM | POA: Diagnosis not present

## 2019-12-05 DIAGNOSIS — Z8673 Personal history of transient ischemic attack (TIA), and cerebral infarction without residual deficits: Secondary | ICD-10-CM | POA: Insufficient documentation

## 2019-12-05 DIAGNOSIS — D849 Immunodeficiency, unspecified: Secondary | ICD-10-CM | POA: Diagnosis not present

## 2019-12-05 DIAGNOSIS — R05 Cough: Secondary | ICD-10-CM | POA: Diagnosis not present

## 2019-12-05 DIAGNOSIS — E1122 Type 2 diabetes mellitus with diabetic chronic kidney disease: Secondary | ICD-10-CM | POA: Diagnosis not present

## 2019-12-05 DIAGNOSIS — R079 Chest pain, unspecified: Secondary | ICD-10-CM | POA: Insufficient documentation

## 2019-12-05 DIAGNOSIS — M549 Dorsalgia, unspecified: Secondary | ICD-10-CM

## 2019-12-05 DIAGNOSIS — Z940 Kidney transplant status: Secondary | ICD-10-CM | POA: Insufficient documentation

## 2019-12-05 DIAGNOSIS — R0789 Other chest pain: Secondary | ICD-10-CM | POA: Diagnosis not present

## 2019-12-05 DIAGNOSIS — N184 Chronic kidney disease, stage 4 (severe): Secondary | ICD-10-CM | POA: Diagnosis present

## 2019-12-05 DIAGNOSIS — Z79899 Other long term (current) drug therapy: Secondary | ICD-10-CM | POA: Insufficient documentation

## 2019-12-05 DIAGNOSIS — J019 Acute sinusitis, unspecified: Principal | ICD-10-CM | POA: Insufficient documentation

## 2019-12-05 DIAGNOSIS — J0191 Acute recurrent sinusitis, unspecified: Secondary | ICD-10-CM | POA: Diagnosis not present

## 2019-12-05 DIAGNOSIS — R519 Headache, unspecified: Secondary | ICD-10-CM | POA: Insufficient documentation

## 2019-12-05 DIAGNOSIS — Z9489 Other transplanted organ and tissue status: Secondary | ICD-10-CM

## 2019-12-05 DIAGNOSIS — Z7982 Long term (current) use of aspirin: Secondary | ICD-10-CM | POA: Diagnosis not present

## 2019-12-05 DIAGNOSIS — I129 Hypertensive chronic kidney disease with stage 1 through stage 4 chronic kidney disease, or unspecified chronic kidney disease: Secondary | ICD-10-CM | POA: Diagnosis not present

## 2019-12-05 DIAGNOSIS — Z87891 Personal history of nicotine dependence: Secondary | ICD-10-CM | POA: Diagnosis not present

## 2019-12-05 DIAGNOSIS — M542 Cervicalgia: Secondary | ICD-10-CM | POA: Diagnosis not present

## 2019-12-05 DIAGNOSIS — E1129 Type 2 diabetes mellitus with other diabetic kidney complication: Secondary | ICD-10-CM | POA: Diagnosis present

## 2019-12-05 DIAGNOSIS — N1831 Chronic kidney disease, stage 3a: Secondary | ICD-10-CM | POA: Diagnosis not present

## 2019-12-05 DIAGNOSIS — R0981 Nasal congestion: Secondary | ICD-10-CM

## 2019-12-05 DIAGNOSIS — N183 Chronic kidney disease, stage 3 unspecified: Secondary | ICD-10-CM | POA: Diagnosis not present

## 2019-12-05 DIAGNOSIS — N1832 Chronic kidney disease, stage 3b: Secondary | ICD-10-CM | POA: Diagnosis not present

## 2019-12-05 DIAGNOSIS — I5189 Other ill-defined heart diseases: Secondary | ICD-10-CM

## 2019-12-05 LAB — CBC
HCT: 37.5 % (ref 36.0–46.0)
Hemoglobin: 12.3 g/dL (ref 12.0–15.0)
MCH: 30.9 pg (ref 26.0–34.0)
MCHC: 32.8 g/dL (ref 30.0–36.0)
MCV: 94.2 fL (ref 80.0–100.0)
Platelets: 210 10*3/uL (ref 150–400)
RBC: 3.98 MIL/uL (ref 3.87–5.11)
RDW: 13.1 % (ref 11.5–15.5)
WBC: 10.2 10*3/uL (ref 4.0–10.5)
nRBC: 0 % (ref 0.0–0.2)

## 2019-12-05 LAB — TROPONIN I (HIGH SENSITIVITY)
Troponin I (High Sensitivity): 16 ng/L (ref <18)
Troponin I (High Sensitivity): 6 ng/L (ref <18)

## 2019-12-05 LAB — HEMOGLOBIN A1C
Hgb A1c MFr Bld: 7.6 % — ABNORMAL HIGH (ref 4.8–5.6)
Mean Plasma Glucose: 171.42 mg/dL

## 2019-12-05 LAB — RESPIRATORY PANEL BY RT PCR (FLU A&B, COVID)
Influenza A by PCR: NEGATIVE
Influenza B by PCR: NEGATIVE
SARS Coronavirus 2 by RT PCR: NEGATIVE

## 2019-12-05 LAB — GLUCOSE, CAPILLARY
Glucose-Capillary: 238 mg/dL — ABNORMAL HIGH (ref 70–99)
Glucose-Capillary: 177 mg/dL — ABNORMAL HIGH (ref 70–99)

## 2019-12-05 LAB — BASIC METABOLIC PANEL
Anion gap: 7 (ref 5–15)
BUN: 35 mg/dL — ABNORMAL HIGH (ref 6–20)
CO2: 18 mmol/L — ABNORMAL LOW (ref 22–32)
Calcium: 9.8 mg/dL (ref 8.9–10.3)
Chloride: 109 mmol/L (ref 98–111)
Creatinine, Ser: 2.76 mg/dL — ABNORMAL HIGH (ref 0.44–1.00)
GFR calc Af Amer: 22 mL/min — ABNORMAL LOW (ref >60)
GFR calc non Af Amer: 19 mL/min — ABNORMAL LOW (ref >60)
Glucose, Bld: 191 mg/dL — ABNORMAL HIGH (ref 70–99)
Potassium: 4.4 mmol/L (ref 3.5–5.1)
Sodium: 134 mmol/L — ABNORMAL LOW (ref 135–145)

## 2019-12-05 LAB — PROCALCITONIN: Procalcitonin: <0.1 ng/mL

## 2019-12-05 LAB — LACTIC ACID, PLASMA
Lactic Acid, Venous: 2.5 mmol/L (ref 0.5–1.9)
Lactic Acid, Venous: 1.1 mmol/L (ref 0.5–1.9)

## 2019-12-05 MED ORDER — ACETAMINOPHEN 650 MG RE SUPP: 650.0000 mg | Freq: Four times a day (QID) | RECTAL | Status: DC | PRN

## 2019-12-05 MED ORDER — ATORVASTATIN CALCIUM 20 MG PO TABS
40.0000 mg | ORAL_TABLET | Freq: Every day | ORAL | Status: DC
  Administered 2019-12-06: 18:00:00 40 mg via ORAL
  Filled 2019-12-05: qty 2

## 2019-12-05 MED ORDER — VALACYCLOVIR HCL 500 MG PO TABS
1000.0000 mg | ORAL_TABLET | Freq: Two times a day (BID) | ORAL | Status: DC
  Filled 2019-12-05: qty 2

## 2019-12-05 MED ORDER — SODIUM CHLORIDE 0.9 % IV SOLN: INTRAVENOUS | Status: DC

## 2019-12-05 MED ORDER — AMOXICILLIN-POT CLAVULANATE 875-125 MG PO TABS: 1.0000 | ORAL_TABLET | Freq: Two times a day (BID) | ORAL | Status: DC

## 2019-12-05 MED ORDER — LIDOCAINE 5 % EX PTCH
1.0000 | MEDICATED_PATCH | CUTANEOUS | Status: DC
  Administered 2019-12-05: 1 via TRANSDERMAL
  Filled 2019-12-05 (×2): qty 1

## 2019-12-05 MED ORDER — GABAPENTIN 300 MG PO CAPS: 600.0000 mg | ORAL_CAPSULE | Freq: Every day | ORAL | Status: DC

## 2019-12-05 MED ORDER — ONDANSETRON HCL 4 MG PO TABS: 4.0000 mg | ORAL_TABLET | Freq: Four times a day (QID) | ORAL | Status: DC | PRN

## 2019-12-05 MED ORDER — HYDROCODONE-ACETAMINOPHEN 5-325 MG PO TABS
1.0000 | ORAL_TABLET | Freq: Four times a day (QID) | ORAL | Status: DC | PRN
  Administered 2019-12-05 – 2019-12-06 (×4): 1 via ORAL
  Filled 2019-12-05 (×4): qty 1

## 2019-12-05 MED ORDER — VITAMIN D 25 MCG (1000 UNIT) PO TABS
1000.0000 [IU] | ORAL_TABLET | Freq: Every day | ORAL | Status: DC
  Administered 2019-12-06: 09:00:00 1000 [IU] via ORAL
  Filled 2019-12-05: qty 1

## 2019-12-05 MED ORDER — SODIUM CHLORIDE 0.9 % IV SOLN
2.0000 g | Freq: Once | INTRAVENOUS | Status: AC
  Administered 2019-12-05: 13:00:00 2 g via INTRAVENOUS
  Filled 2019-12-05: qty 2

## 2019-12-05 MED ORDER — SODIUM CHLORIDE 0.9 % IV BOLUS
1000.0000 mL | Freq: Once | INTRAVENOUS | Status: AC
  Administered 2019-12-05: 1000 mL via INTRAVENOUS

## 2019-12-05 MED ORDER — TACROLIMUS 1 MG PO CAPS
4.0000 mg | ORAL_CAPSULE | Freq: Two times a day (BID) | ORAL | Status: DC
  Administered 2019-12-05 – 2019-12-06 (×2): 4 mg via ORAL
  Filled 2019-12-05 (×3): qty 4

## 2019-12-05 MED ORDER — ACETAMINOPHEN 325 MG PO TABS: 650.0000 mg | ORAL_TABLET | Freq: Four times a day (QID) | ORAL | Status: DC | PRN

## 2019-12-05 MED ORDER — GABAPENTIN 300 MG PO CAPS
300.0000 mg | ORAL_CAPSULE | Freq: Two times a day (BID) | ORAL | Status: DC
  Administered 2019-12-06 (×2): 300 mg via ORAL
  Filled 2019-12-05 (×2): qty 1

## 2019-12-05 MED ORDER — LORATADINE 10 MG PO TABS
10.0000 mg | ORAL_TABLET | Freq: Every day | ORAL | Status: DC
  Administered 2019-12-06: 09:00:00 10 mg via ORAL
  Filled 2019-12-05: qty 1

## 2019-12-05 MED ORDER — SODIUM CHLORIDE 0.9 % IV BOLUS
1000.0000 mL | Freq: Once | INTRAVENOUS | Status: AC
  Administered 2019-12-05: 13:00:00 1000 mL via INTRAVENOUS

## 2019-12-05 MED ORDER — ASPIRIN EC 81 MG PO TBEC
81.0000 mg | DELAYED_RELEASE_TABLET | Freq: Every day | ORAL | Status: DC
  Administered 2019-12-06: 09:00:00 81 mg via ORAL
  Filled 2019-12-05: qty 1

## 2019-12-05 MED ORDER — MAGNESIUM OXIDE 400 (241.3 MG) MG PO TABS
400.0000 mg | ORAL_TABLET | Freq: Every day | ORAL | Status: DC
  Administered 2019-12-06: 09:00:00 400 mg via ORAL
  Filled 2019-12-05: qty 1

## 2019-12-05 MED ORDER — MYCOPHENOLATE SODIUM 180 MG PO TBEC
180.0000 mg | DELAYED_RELEASE_TABLET | Freq: Three times a day (TID) | ORAL | Status: DC
  Administered 2019-12-05 (×2): 180 mg via ORAL
  Filled 2019-12-05 (×3): qty 1

## 2019-12-05 MED ORDER — BUTALBITAL-APAP-CAFFEINE 50-325-40 MG PO TABS
1.0000 | ORAL_TABLET | Freq: Once | ORAL | Status: AC
  Administered 2019-12-05: 1 via ORAL
  Filled 2019-12-05: qty 1

## 2019-12-05 MED ORDER — GABAPENTIN 300 MG PO CAPS
600.0000 mg | ORAL_CAPSULE | Freq: Three times a day (TID) | ORAL | Status: DC
  Administered 2019-12-05: 600 mg via ORAL
  Filled 2019-12-05: qty 2

## 2019-12-05 MED ORDER — CARVEDILOL 6.25 MG PO TABS: 12.5000 mg | ORAL_TABLET | Freq: Two times a day (BID) | ORAL | Status: DC

## 2019-12-05 MED ORDER — FLUTICASONE PROPIONATE 50 MCG/ACT NA SUSP
2.0000 | Freq: Two times a day (BID) | NASAL | Status: DC
  Administered 2019-12-05 – 2019-12-06 (×2): 2 via NASAL
  Filled 2019-12-05: qty 16

## 2019-12-05 MED ORDER — SODIUM CHLORIDE 0.9 % IV SOLN
1.0000 g | INTRAVENOUS | Status: DC
  Administered 2019-12-05: 1 g via INTRAVENOUS
  Filled 2019-12-05 (×3): qty 10

## 2019-12-05 MED ORDER — PANTOPRAZOLE SODIUM 40 MG PO TBEC
40.0000 mg | DELAYED_RELEASE_TABLET | Freq: Every day | ORAL | Status: DC
  Administered 2019-12-06: 09:00:00 40 mg via ORAL
  Filled 2019-12-05: qty 1

## 2019-12-05 MED ORDER — SODIUM BICARBONATE 650 MG PO TABS
1300.0000 mg | ORAL_TABLET | Freq: Three times a day (TID) | ORAL | Status: DC
  Administered 2019-12-05 – 2019-12-06 (×4): 1300 mg via ORAL
  Filled 2019-12-05 (×4): qty 2

## 2019-12-05 MED ORDER — LEVETIRACETAM 500 MG PO TABS
500.0000 mg | ORAL_TABLET | Freq: Two times a day (BID) | ORAL | Status: DC
  Administered 2019-12-05 – 2019-12-06 (×2): 500 mg via ORAL
  Filled 2019-12-05 (×2): qty 1

## 2019-12-05 MED ORDER — VALACYCLOVIR HCL 500 MG PO TABS
1000.0000 mg | ORAL_TABLET | Freq: Two times a day (BID) | ORAL | Status: DC | PRN
  Filled 2019-12-05: qty 2

## 2019-12-05 MED ORDER — VANCOMYCIN HCL IN DEXTROSE 1-5 GM/200ML-% IV SOLN
1000.0000 mg | Freq: Once | INTRAVENOUS | Status: AC
  Administered 2019-12-05: 1000 mg via INTRAVENOUS
  Filled 2019-12-05: qty 200

## 2019-12-05 MED ORDER — ONDANSETRON HCL 4 MG/2ML IJ SOLN: 4.0000 mg | Freq: Four times a day (QID) | INTRAMUSCULAR | Status: DC | PRN

## 2019-12-05 MED ORDER — INSULIN ASPART 100 UNIT/ML ~~LOC~~ SOLN
0.0000 [IU] | Freq: Three times a day (TID) | SUBCUTANEOUS | Status: DC
  Administered 2019-12-05: 5 [IU] via SUBCUTANEOUS
  Administered 2019-12-06: 09:00:00 2 [IU] via SUBCUTANEOUS
  Administered 2019-12-06: 17:00:00 3 [IU] via SUBCUTANEOUS
  Filled 2019-12-05 (×3): qty 1

## 2019-12-05 MED ORDER — NITROGLYCERIN 0.4 MG SL SUBL: 0.4000 mg | SUBLINGUAL_TABLET | SUBLINGUAL | Status: DC | PRN

## 2019-12-05 MED ORDER — DOCUSATE SODIUM 100 MG PO CAPS
100.0000 mg | ORAL_CAPSULE | Freq: Every day | ORAL | Status: DC
  Administered 2019-12-06: 09:00:00 100 mg via ORAL
  Filled 2019-12-05: qty 1

## 2019-12-05 MED ORDER — TAMOXIFEN CITRATE 10 MG PO TABS
20.0000 mg | ORAL_TABLET | Freq: Every day | ORAL | Status: DC
  Administered 2019-12-06: 09:00:00 20 mg via ORAL
  Filled 2019-12-05: qty 2

## 2019-12-05 NOTE — ED Provider Notes (Signed)
[facility] Emergency Department Provider Note  ____________________________________________   First MD Initiated Contact with Patient 12/05/19 1208     (approximate)  I have reviewed the triage vital signs and the nursing notes.  History  Chief Complaint Chest Pain and Headache    HPI [name] is a 54 y.o. 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  Patient states symptoms have been present for the last 6 to 7 days and constant since onset, worsening somewhat over the last few days.  She reports a history of sinus congestion and sinus infections, especially with weather changes which have been occurring recently.  Normally she uses nasal sprays, cetirizine, and Mucinex with resolution in her symptoms, however this has not worked this time.  She feels a lot of nasal congestion and sinus pressure, as well as a sinus pressure type headache.  She feels like her infection has migrated into her chest and is feeling a chest discomfort, chest congestion as well.  Chest pain is sharp, moderate in severity, central and left-sided, no radiation.  No alleviating or aggravating components.  She states her cough is productive of a yellowish-brown sputum.  Also reports associated body aches.  No vomiting or diarrhea.  Reports a fever yesterday to 102.  Temperature 100.1 on arrival here.  Reports compliance with all of her medications, including her transplant medications.   Past Medical Hx Past Medical History:  Diagnosis Date  . Arrhythmia   . Atypical chest pain 08/09/2018  . Cerebrovascular accident [facility]) 2010   without any focal neurological deficits.   . Clotted renal dialysis AV graft ([place])   . Compression of vein 08/16/2018  . Dependence on hemodialysis ([place]) 11/17/2013  . Diabetes mellitus without  complication ([place])   . Ductal carcinoma in situ (DCIS) of left breast   . End stage renal disease ([place])    a. previously on dialysis on Tues, Thurs, & 10-30-2022 x 7 yrs; b. s/p deceased donor transplant 2014-04-13   . GERD (gastroesophageal reflux disease)   . History of methicillin resistant Staphylococcus aureus infection   . History of stress test    a. 04/2013: normal, EF 65%  . HIT (heparin-induced thrombocytopenia) ([place])   . Hypertension   . Mitral regurgitation    a. 01/2014: EF >55%, LVH, mild MR, dilated LA  . Renal transplant, status post 13-Apr-2014  . RLS (restless legs syndrome)   . Seizure disorder ([place])   . Seizures ([place])   . Sepsis ([place]) 08/09/2016  . Stroke due to intracerebral hemorrhage ([place]) 11/17/2013    Problem List Patient Active Problem List   Diagnosis Date Noted  . Acute diverticulitis 08/21/2018  . Diastolic dysfunction without heart failure 08/16/2018  . Immunosuppressed status ([place]) 08/16/2018  . Chronic bilateral low back pain with left-sided sciatica 09/27/2017  . Age-related nuclear cataract of both eyes 08/19/2017  . Hyperopia with astigmatism and presbyopia, bilateral 08/19/2017  . Hypertensive retinopathy of both eyes 08/19/2017  . DM (diabetes mellitus), type 2 with renal complications ([place]) 54/65/0354  . Vitamin D deficiency 10/02/2016  . 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 CKD (chronic kidney disease) stage 3, GFR 30-59 ml/min 08/17/2016  . Neuropathic pain of right thigh 08/17/2016  . History of CVA (cerebrovascular accident) 08/17/2016  . Ductal carcinoma in situ (DCIS) of left breast 09/12/2015  .  Mitral regurgitation   . Renal transplant, status post 03/24/2014  . GERD (gastroesophageal reflux disease) 03/24/2014  . SVT (supraventricular tachycardia) ([place]) 11/17/2013  . Seizure disorder ([place]) 11/17/2013  . Benign hypertensive renal disease 11/17/2013    Past Surgical Hx Past Surgical History:  Procedure Laterality Date  .  INSERTION OF DIALYSIS CATHETER     x 2  . KIDNEY TRANSPLANT  03/2014   right side.  Marland Kitchen MASTECTOMY PARTIAL / LUMPECTOMY Left 09/24/2015  . MASTECTOMY PARTIAL / LUMPECTOMY Left 10/25/2015    Medications Prior to Admission medications   Medication Sig Start Date End Date Taking? Authorizing Provider  aspirin EC 81 MG tablet Take 81 mg by mouth daily.    [provider]  atorvastatin (LIPITOR) 40 MG tablet Take 1 tablet (40 mg total) by mouth daily. 08/16/18   Valerie Roys, DO  azithromycin (ZITHROMAX) 250 MG tablet Take 2 tabs day one, then 1 tab daily until complete 05/10/19   [place] American, PA-C  Blood Glucose Monitoring Suppl (ONE TOUCH ULTRA 2) w/Device KIT  08/25/18   [provider]  carvedilol (COREG) 12.5 MG tablet Take 12.5 mg by mouth 2 (two) times daily with a meal.  08/06/16   [provider]  cetirizine (ZYRTEC) 10 MG tablet Take 1 tablet (10 mg total) by mouth daily as needed for allergies or rhinitis. 08/16/18   Johnson, Megan P, DO  cholecalciferol (VITAMIN D) 25 MCG (1000 UT) tablet Take 1,000 Units by mouth daily.    [provider]  clindamycin (CLEOCIN) 300 MG capsule Take 1 capsule (300 mg total) by mouth 4 (four) times daily. Patient not taking: Reported on 05/10/2019 10/31/18   Park Liter P, DO  docusate sodium (COLACE) 100 MG capsule Take 100 mg by mouth daily.    [provider]  fluticasone (FLONASE) 50 MCG/ACT nasal spray Two sprays each nare twice a day. 11/04/19   Johnson, Megan P, DO  gabapentin (NEURONTIN) 300 MG capsule Take 2 capsules (600 mg total) by mouth 3 (three) times daily. 02/17/19   Johnson, Megan P, DO  glipiZIDE (GLUCOTROL) 5 MG tablet Take 1 tablet (5 mg total) by mouth 2 (two) times daily for 30 days. 08/24/18 05/10/19  Otila Back, MD  HYDROcodone-acetaminophen (NORCO/VICODIN) 5-325 MG tablet Take 1 tablet by mouth every 6 (six) hours as needed. Take 1 tablet by mouth every 6 hours as needed for patient  for up to 20 doses. 03/23/19   [provider]  Lancets Glory Rosebush ULTRASOFT) lancets  08/25/18   [provider]  levETIRAcetam (KEPPRA) 500 MG tablet Take 500 mg by mouth 2 (two) times daily.  11/09/16   [provider]  lidocaine (LIDODERM) 5 % Place 1 patch onto the skin daily. Remove & Discard patch within 12 hours or as directed by MD 06/09/19   Laban Emperor, PA-C  magnesium oxide (MAG-OX) 400 MG tablet Take 400 mg by mouth daily.     [provider]  mycophenolate (MYFORTIC) 180 MG EC tablet Take 180 mg by mouth 3 (three) times daily.    [provider]  nitroGLYCERIN (NITROSTAT) 0.4 MG SL tablet Place 1 tablet (0.4 mg total) under the tongue every 5 (five) minutes x 3 doses as needed for chest pain. 08/09/18   Demetrios Loll, MD  omeprazole (PRILOSEC) 20 MG capsule Take 1 capsule (20 mg total) by mouth daily as needed (GERD or heartburn). 08/16/18   Johnson, Megan P, DO  ONE TOUCH ULTRA TEST  test strip  08/25/18   [provider]  sodium bicarbonate 650 MG tablet Take 2 tablets (1300 mg) three times a day. 04/11/14   [provider]  tacrolimus (PROGRAF) 1 MG capsule Take 4 mg by mouth 2 (two) times daily.  06/06/14   [provider]  tamoxifen (NOLVADEX) 20 MG tablet Take 20 mg by mouth daily. 12/07/16   [provider]  traMADol (ULTRAM) 50 MG tablet Take 1 tablet (50 mg total) by mouth every 8 (eight) hours as needed. 10/17/18   Johnson, Megan P, DO  valACYclovir (VALTREX) 1000 MG tablet Take 1 tablet (1,000 mg total) by mouth 2 (two) times daily. 11/13/19   Park Liter P, DO    Allergies Heparin, Penicillin g, Ibuprofen, Other, and Penicillins  Family Hx Family History  Problem Relation Age of Onset  . Heart attack Mother   . Hypertension Mother   . Hyperlipidemia Mother   . Cancer Mother        breast  . Heart attack Father   . Hypertension Father   . Hyperlipidemia Father   . Kidney failure Sister         half sister  . Diabetes Maternal Grandmother   . Hearing loss Maternal Grandmother   . Heart failure Maternal Grandmother   . Diabetes Maternal Grandfather   . Stroke Maternal Grandfather   . Cancer Paternal Grandmother        breast  . Stroke Paternal Grandfather   . Aneurysm Brother        Brain  . Other Sister        Nerve problems    Social Hx Social History   Tobacco Use  . Smoking status: Former Smoker    Quit date: 11/25/2008    Years since quitting: 11.0  . Smokeless tobacco: Never Used  Substance Use Topics  . Alcohol use: Yes    Alcohol/week: 2.0 standard drinks    Types: 2 Glasses of wine per week  . Drug use: No    Types: "Crack" cocaine    Comment: quit in 2003     Review of Systems  Constitutional: Negative for fever. Negative for chills. Eyes: Negative for visual changes. ENT: Negative for sore throat. Cardiovascular: + for chest pain. Respiratory: Negative for shortness of breath. + cough Gastrointestinal: Negative for nausea. Negative for vomiting.  Genitourinary: Negative for dysuria. Musculoskeletal: Negative for leg swelling. Skin: Negative for rash. Neurological: + for headaches.   Physical Exam  Vital Signs: ED Triage Vitals  Enc Vitals Group     BP 12/05/19 1149 105/73     Pulse Rate 12/05/19 1149 (!) 103     Resp 12/05/19 1149 18     Temp 12/05/19 1149 100.1 F (37.8 C)     Temp Source 12/05/19 1149 Oral     SpO2 12/05/19 1149 95 %     Weight 12/05/19 1146 195 lb (88.5 kg)     Height 12/05/19 1146 '5\' 5"'$  (1.651 m)     Head Circumference --      Peak Flow --      Pain Score 12/05/19 1145 10     Pain Loc --      Pain Edu? --      Excl. in [place]? --     Constitutional: Alert and oriented. Well appearing. NAD.  Head: Normocephalic. Atraumatic. Eyes: Conjunctivae clear. Sclera anicteric. Pupils equal and symmetric. Nose: No masses or lesions.  Sinus & nasal congestion. Mouth/Throat: Wearing mask.  Neck: No stridor. Trachea  midline.  Cardiovascular: HR low 100s on arrival, regular rhythm. Extremities well perfused. Respiratory: Normal respiratory effort.  Lungs CTAB.  Productive cough during exam. Gastrointestinal: Soft. Non-distended. Non-tender.  Genitourinary: Deferred. Musculoskeletal: No lower extremity edema. No deformities. Neurologic:  Normal speech and language. No gross focal or lateralizing neurologic deficits are appreciated.  Skin: Skin is warm, dry and intact. No rash noted. Psychiatric: Mood and affect are appropriate for situation.  EKG  Personally reviewed and interpreted by myself.   Date: 12/05/19 Time: 1147 Rate: 99 Rhythm: sinus Axis: normal Intervals: WNL Borderline ST changes in III, aVF, but does not meet STEMI criteria TWI now upright in III compared to inverted previously No STEMI  Repeat EKG obtained 1217 Rate: 94 Rhythm: Sinus Axis: Normal Intervals: Within normal limits Borderline ST changes seen resolved on repeat Q wave lead III, seen previously    Radiology  Personally reviewed available imaging myself.   CXR - IMPRESSION:  Lungs clear. Cardiac silhouette normal. No adenopathy.    Procedures  Procedure(s) performed (including critical care):  Procedures   Initial Impression / Assessment and Plan / MDM / ED Course  54 y.o. female hx of renal transplant, CAD, who presents to the ED for headache, sinus congestion, chest pain and congestion, productive cough.  Ddx: sinusitis, bronchitis, URI, pneumonia, COVID, pleurisy, pericarditis  Patient is generally well-appearing , but noted on arrival to have a temperature of 100.1, blood pressure slightly low at 105/73, and mildly tachycardic with HR 103.  States she had a temperature to 102 yesterday.  In the setting of her renal transplant, on immunosuppressants, will plan for labs, including lactic, culture, procalcitonin, COVID testing (of note, patient is full vaccinated).   On chart review, Cr seems to  range from 1.7-2.5.  Clinical Course as of Dec 04 1416  Tue Dec 05, 2019  1314 Patient's lactic elevated at 2.5. Given this + her immunocompromised state will proceed with fluids and dose of IV antibiotics. CXR is clear - suspect etiology sinusitis/bronchitis. Initial troponin negative. Creatinine 2.76, slightly increased from her priors.    [SM]  3295 Discussed likely admission w/ patient given concern for infection (fever yesterday, lactic elevated, borderline blood pressure compared to her normal). She is in agreement. She does desire admission here. Awaiting COVID swab then anticipate admit.    [SM]  [place] negative.   [SM]  1418 Discussed w/ hospitalist for admission.   [SM]    Clinical Course User Index [SM] Lilia Pro., MD     _______________________________   As part of my medical decision making I have reviewed available labs, radiology tests, reviewed old records/performed chart review.    Final Clinical Impression(s) / ED Diagnosis  Final diagnoses:  Complaint of headache  Sinus congestion  Cough  Transplant recipient  Bronchitis       Note:  This document was prepared using Dragon voice recognition software and may include unintentional dictation errors.   Lilia Pro., MD 12/05/19 1420

## 2019-12-05 NOTE — ED Notes (Signed)
Dr. Monks at bedside 

## 2019-12-05 NOTE — H&P (Signed)
History and Physical    [name] HEN:[id number] DOB: Dec 12, 1965 DOA: 12/05/2019  PCP: Valerie Roys, DO   Patient coming from: Home I have personally briefly reviewed patient's old medical records in [place]  Chief Complaint: Headache and chest pain.   HPI: [name] is a 54 y.o. 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 Patient stated that her symptoms have been present for about a week and have been constant but worsened over the last couple of days.  She reports a history of sinus congestion and frequent sinus infections especially with weather changes.  Normally she uses nasal sprays, antihistamines and Mucinex with resolution of her symptoms however this has not worked at this time.   She feels a lot of nasal congestion and sinus pressure and complains of a sinus type headache.  She feels like the infection has migrated into her chest and complains of chest discomfort and congestion.  She also has a cough which is productive of yellowish-brown sputum.  Chest pain/discomfort as sharp, moderate in severity and left-sided with no radiation.  She has no relieving or aggravating factors. Associated with her symptoms is fever.  Patient had a T-max of 100.1 upon arrival to the emergency room but stated that she was as high as 102 at home prior to her admission. Labs reveal a normal white cell count and elevated lactic acid level. Chest x-ray shows clear Lungs .  Cardiac silhouette normal.  No adenopathy.  ED Course: Patient is a 54 year old female who presents to the emergency room for evaluation of headache, sinus congestion, chest pain and a productive cough.  She had a T-max of 100.44F upon arrival to the ER, blood  pressure was slightly on the low side with systolic of 361 and she was tachycardic.  Lactic acid was elevated at 2.5 and patient received sepsis fluids as well as a dose of IV antibiotics.  She will be admitted to the hospital for further evaluation.  Review of Systems: As per HPI otherwise 10 point review of systems negative.    Past Medical History:  Diagnosis Date  . Arrhythmia   . Atypical chest pain 08/09/2018  . Cerebrovascular accident [facility]) Nov 10, 2008   without any focal neurological deficits.   . Clotted renal dialysis AV graft ([place])   . Compression of vein 08/16/2018  . Dependence on hemodialysis ([place]) 11/17/2013  . Diabetes mellitus without complication ([place])   . Ductal carcinoma in situ (DCIS) of left breast   . End stage renal disease ([place])    a. previously on dialysis on Tues, Thurs, & 11-11-2022 x 7 yrs; b. s/p deceased donor transplant 04-11-2014   . GERD (gastroesophageal reflux disease)   . History of methicillin resistant Staphylococcus aureus infection   . History of stress test    a. 04/2013: normal, EF 65%  . HIT (heparin-induced thrombocytopenia) ([place])   . Hypertension   . Mitral regurgitation    a. 01/2014: EF >55%, LVH, mild MR, dilated LA  . Renal transplant, status post 04/11/2014  . RLS (restless legs syndrome)   . Seizure disorder ([place])   . Seizures ([place])   . Sepsis ([place]) 08/09/2016  . Stroke due to intracerebral hemorrhage ([place]) 11/17/2013    Past Surgical History:  Procedure  Laterality Date  . INSERTION OF DIALYSIS CATHETER     x 2  . KIDNEY TRANSPLANT  03/2014   right side.  Marland Kitchen MASTECTOMY PARTIAL / LUMPECTOMY Left 09/24/2015  . MASTECTOMY PARTIAL / LUMPECTOMY Left 10/25/2015     reports that she quit smoking about 11 years ago. She has never used smokeless tobacco. She reports current alcohol use of about 2.0 standard drinks of alcohol per week. She reports that she does not use drugs.  Allergies  Allergen Reactions  . Heparin     Heparin induced  THrombocytopenia "i bled for a long time and had to have 2 blood transfusions"   . Penicillin G   . Ibuprofen     Caused kidney damage Can't take due to kidneys  . Other     Other reaction(s): Other (See Comments) Causing HITT  . Penicillins     Childhood reaction Patient is unsure if she's allergic, was told she had a reaction as a child but thinks she's taking a "cillin" while sick without having a reaction.  Pt states she has taken amoxicillin in the past and did not have problem    Family History  Problem Relation Age of Onset  . Heart attack Mother   . Hypertension Mother   . Hyperlipidemia Mother   . Cancer Mother        breast  . Heart attack Father   . Hypertension Father   . Hyperlipidemia Father   . Kidney failure Sister        half sister  . Diabetes Maternal Grandmother   . Hearing loss Maternal Grandmother   . Heart failure Maternal Grandmother   . Diabetes Maternal Grandfather   . Stroke Maternal Grandfather   . Cancer Paternal Grandmother        breast  . Stroke Paternal Grandfather   . Aneurysm Brother        Brain  . Other Sister        Nerve problems     Prior to Admission medications   Medication Sig Start Date End Date Taking? Authorizing Provider  aspirin EC 81 MG tablet Take 81 mg by mouth daily.    [provider]  atorvastatin (LIPITOR) 40 MG tablet Take 1 tablet (40 mg total) by mouth daily. 08/16/18   Park Liter P, DO  carvedilol (COREG) 12.5 MG tablet Take 12.5 mg by mouth 2 (two) times daily with a meal.  08/06/16   [provider]  cetirizine (ZYRTEC) 10 MG tablet Take 1 tablet (10 mg total) by mouth daily as needed for allergies or rhinitis. 08/16/18   Johnson, Megan P, DO  cholecalciferol (VITAMIN D) 25 MCG (1000 UT) tablet Take 1,000 Units by mouth daily.    [provider]  docusate sodium (COLACE) 100 MG capsule Take 100 mg by mouth daily.    [provider]  fluticasone (FLONASE) 50 MCG/ACT nasal  spray Two sprays each nare twice a day. 11/04/19   Johnson, Megan P, DO  gabapentin (NEURONTIN) 300 MG capsule Take 2 capsules (600 mg total) by mouth 3 (three) times daily. 02/17/19   Johnson, Megan P, DO  glipiZIDE (GLUCOTROL) 5 MG tablet Take 1 tablet (5 mg total) by mouth 2 (two) times daily for 30 days. 08/24/18 05/10/19  Otila Back, MD  HYDROcodone-acetaminophen (NORCO/VICODIN) 5-325 MG tablet Take 1 tablet by mouth every 6 (six) hours as needed. Take 1 tablet by mouth every 6 hours as needed for patient for up to 20  doses. 03/23/19   [provider]  levETIRAcetam (KEPPRA) 500 MG tablet Take 500 mg by mouth 2 (two) times daily.  11/09/16   [provider]  lidocaine (LIDODERM) 5 % Place 1 patch onto the skin daily. Remove & Discard patch within 12 hours or as directed by MD 06/09/19   Laban Emperor, PA-C  magnesium oxide (MAG-OX) 400 MG tablet Take 400 mg by mouth daily.     [provider]  mycophenolate (MYFORTIC) 180 MG EC tablet Take 180 mg by mouth 3 (three) times daily.    [provider]  nitroGLYCERIN (NITROSTAT) 0.4 MG SL tablet Place 1 tablet (0.4 mg total) under the tongue every 5 (five) minutes x 3 doses as needed for chest pain. 08/09/18   Demetrios Loll, MD  omeprazole (PRILOSEC) 20 MG capsule Take 1 capsule (20 mg total) by mouth daily as needed (GERD or heartburn). 08/16/18   Johnson, Megan P, DO  sodium bicarbonate 650 MG tablet Take 2 tablets (1300 mg) three times a day. 04/11/14   [provider]  tacrolimus (PROGRAF) 1 MG capsule Take 4 mg by mouth 2 (two) times daily.  06/06/14   [provider]  tamoxifen (NOLVADEX) 20 MG tablet Take 20 mg by mouth daily. 12/07/16   [provider]  valACYclovir (VALTREX) 1000 MG tablet Take 1 tablet (1,000 mg total) by mouth 2 (two) times daily. 11/13/19   Valerie Roys, DO    Physical Exam: Vitals:   12/05/19 1330 12/05/19 1400 12/05/19 1441 12/05/19 1500  BP: 105/80  140/78 100/87    Pulse: 93 76 75 73  Resp:  (!) 23 (!) 21 (!) 29  Temp:      TempSrc:      SpO2: 93% 98% 97% 100%  Weight:      Height:         Vitals:   12/05/19 1330 12/05/19 1400 12/05/19 1441 12/05/19 1500  BP: 105/80  140/78 100/87  Pulse: 93 76 75 73  Resp:  (!) 23 (!) 21 (!) 29  Temp:      TempSrc:      SpO2: 93% 98% 97% 100%  Weight:      Height:        Constitutional: NAD, alert and oriented x 3 Eyes: PERRL, lids and conjunctivae normal ENMT: Mucous membranes are moist.  Neck: normal, supple, no masses, no thyromegaly Respiratory: clear to auscultation bilaterally, no wheezing, no crackles. Normal respiratory effort. No accessory muscle use.  Cardiovascular: Regular rate and rhythm, no murmurs / rubs / gallops. No extremity edema. 2+ pedal pulses. No carotid bruits.  Abdomen: no tenderness, no masses palpated. No hepatosplenomegaly. Bowel sounds positive.  Musculoskeletal: no clubbing / cyanosis. No joint deformity upper and lower extremities.  Skin: no rashes, lesions, ulcers.  Neurologic: No gross focal neurologic deficit. Psychiatric: Normal mood and affect.   Labs on Admission: I have personally reviewed following labs and imaging studies  CBC: Recent Labs  Lab 12/05/19 1201  WBC 10.2  HGB 12.3  HCT 37.5  MCV 94.2  PLT 161   Basic Metabolic Panel: Recent Labs  Lab 12/05/19 1201  NA 134*  K 4.4  CL 109  CO2 18*  GLUCOSE 191*  BUN 35*  CREATININE 2.76*  CALCIUM 9.8   GFR: Estimated Creatinine Clearance: 25.9 mL/min (A) (by C-G formula based on SCr of 2.76 mg/dL (H)). Liver Function Tests: No results for input(s): AST, ALT, ALKPHOS, BILITOT, PROT, ALBUMIN in the last 168  hours. No results for input(s): LIPASE, AMYLASE in the last 168 hours. No results for input(s): AMMONIA in the last 168 hours. Coagulation Profile: No results for input(s): INR, PROTIME in the last 168 hours. Cardiac Enzymes: No results for input(s): CKTOTAL, CKMB, CKMBINDEX, TROPONINI  in the last 168 hours. BNP (last 3 results) No results for input(s): PROBNP in the last 8760 hours. HbA1C: No results for input(s): HGBA1C in the last 72 hours. CBG: No results for input(s): GLUCAP in the last 168 hours. Lipid Profile: No results for input(s): CHOL, HDL, LDLCALC, TRIG, CHOLHDL, LDLDIRECT in the last 72 hours. Thyroid Function Tests: No results for input(s): TSH, T4TOTAL, FREET4, T3FREE, THYROIDAB in the last 72 hours. Anemia Panel: No results for input(s): VITAMINB12, FOLATE, FERRITIN, TIBC, IRON, RETICCTPCT in the last 72 hours. Urine analysis:    Component Value Date/Time   COLORURINE STRAW (A) 08/22/2018 0855   APPEARANCEUR CLEAR (A) 08/22/2018 0855   APPEARANCEUR Clear 08/16/2018 1506   LABSPEC 1.009 08/22/2018 0855   PHURINE 6.0 08/22/2018 0855   GLUCOSEU NEGATIVE 08/22/2018 0855   HGBUR SMALL (A) 08/22/2018 0855   BILIRUBINUR NEGATIVE 08/22/2018 0855   BILIRUBINUR Negative 08/16/2018 [place] 08/22/2018 0855   PROTEINUR NEGATIVE 08/22/2018 0855   NITRITE NEGATIVE 08/22/2018 0855   LEUKOCYTESUR NEGATIVE 08/22/2018 0855   LEUKOCYTESUR Negative 08/16/2018 1506    Radiological Exams on Admission: DG Chest 2 View  Result Date: 12/05/2019 CLINICAL DATA:  Cough and chest pain EXAM: CHEST - 2 VIEW COMPARISON:  August 08, 2018 FINDINGS: Lungs are clear. Heart size and pulmonary vascularity are normal. No adenopathy. There is a stent in the left brachial region. There is degenerative change in the right shoulder. IMPRESSION: Lungs clear.  Cardiac silhouette normal.  No adenopathy. Electronically Signed   By: Lowella Grip III M.D.   On: 12/05/2019 12:19    EKG: Independently reviewed.  Normal Sinus Rhythm  Assessment/Plan Principal Problem:   Sinusitis, acute Active Problems:   Seizure disorder (HCC)   Renal transplant, status post   CKD (chronic kidney disease) stage 3, GFR 30-59 ml/min   DM (diabetes mellitus), type 2 with renal  complications (HCC)   Diastolic dysfunction without heart failure   Immunosuppressed status (HCC)   Acute sinusitis    Acute sinusitis Supportive care with decongestants and antitussive agents Place patient on Rocephin 1 g IV daily   Status post renal transplant on immunosuppressive therapy Continue tacrolimus and mycophenolate   Diabetes mellitus with complications of stage III chronic kidney disease Stable Maintain consistent carbohydrate diet Glycemic control with sliding scale coverage Continue bicarbonate supplementation   Seizure disorder Continue Keppra Place patient on seizure precaution   GERD Continue oral; PPI  DVT prophylaxis: SCD Code Status: Full  Family Communication: Greater than 50% of time was spent discussing patient's condition and plan of care with her at the bedside.  All questions and concerns were addressed.  She verbalizes understanding and agrees with the plan. Disposition Plan: Back to previous home environment Consults called: None    Nashae Maudlin MD [facility]     12/05/2019, 3:24 PM

## 2019-12-05 NOTE — ED Notes (Addendum)
Dr. Jari Pigg signed off on pt first EKG. Wants a repeat EKG in 15 minutes and IV placed. This RN placed IV and drew blood (including blue top). Pt went to xray via wheelchair and will repeat EKG when pt returns.

## 2019-12-05 NOTE — ED Triage Notes (Signed)
Pt states posterior HA x few days. States thought it was sinus infection so has been taking OTC. Cough and CP "all the way across" A&O, ambulatory.

## 2019-12-05 NOTE — ED Notes (Signed)
See triage note, pt reports CP for the past week across her chest along with "sinus infection". Denies SHOB.  C/o headache at this time

## 2019-12-05 NOTE — ED Notes (Signed)
1 set of cultures collected. Lab contacted to collect 2nd set of cultures d/t pt having limited areas able to stick

## 2019-12-05 NOTE — Consult Note (Signed)
PHARMACY -  BRIEF ANTIBIOTIC NOTE   Pharmacy has received consult(s) for Aztreonam and Vancomycin from an ED provider.  The patient's profile has been reviewed for ht/wt/allergies/indication/available labs.    One time order(s) placed for Aztreonam 2g x1 and Vancomycin 1g x1.   Further antibiotics/pharmacy consults should be ordered by admitting physician if indicated.                       Thank you, Rowland Lathe 12/05/2019  1:33 PM

## 2019-12-05 NOTE — ED Notes (Addendum)
Dr. Joan Mayans notified of lactic 2.5, orders to be placed

## 2019-12-05 NOTE — ED Notes (Signed)
Pt given blanket as requested

## 2019-12-05 NOTE — ED Notes (Signed)
Pt denies being able to provide urine sample at this time

## 2019-12-05 NOTE — ED Notes (Addendum)
Pt denies being able to provide urine sample at this time, placed on purewick

## 2019-12-05 NOTE — ED Notes (Signed)
Lab at bedside to collect cultures °

## 2019-12-05 NOTE — ED Notes (Signed)
Pt given sandwich tray as requested

## 2019-12-06 DIAGNOSIS — Z940 Kidney transplant status: Secondary | ICD-10-CM

## 2019-12-06 DIAGNOSIS — N179 Acute kidney failure, unspecified: Secondary | ICD-10-CM | POA: Diagnosis not present

## 2019-12-06 DIAGNOSIS — J019 Acute sinusitis, unspecified: Secondary | ICD-10-CM | POA: Diagnosis not present

## 2019-12-06 DIAGNOSIS — N189 Chronic kidney disease, unspecified: Secondary | ICD-10-CM | POA: Diagnosis not present

## 2019-12-06 DIAGNOSIS — J0191 Acute recurrent sinusitis, unspecified: Secondary | ICD-10-CM | POA: Diagnosis not present

## 2019-12-06 LAB — CBC
HCT: 32.6 % — ABNORMAL LOW (ref 36.0–46.0)
Hemoglobin: 10.6 g/dL — ABNORMAL LOW (ref 12.0–15.0)
MCH: 30.9 pg (ref 26.0–34.0)
MCHC: 32.5 g/dL (ref 30.0–36.0)
MCV: 95 fL (ref 80.0–100.0)
Platelets: 168 10*3/uL (ref 150–400)
RBC: 3.43 MIL/uL — ABNORMAL LOW (ref 3.87–5.11)
RDW: 13.2 % (ref 11.5–15.5)
WBC: 7.1 10*3/uL (ref 4.0–10.5)
nRBC: 0 % (ref 0.0–0.2)

## 2019-12-06 LAB — URINALYSIS, COMPLETE (UACMP) WITH MICROSCOPIC
Bilirubin Urine: NEGATIVE
Glucose, UA: 50 mg/dL — AB
Ketones, ur: NEGATIVE mg/dL
Leukocytes,Ua: NEGATIVE
Nitrite: NEGATIVE
Protein, ur: NEGATIVE mg/dL
Specific Gravity, Urine: 1.013 (ref 1.005–1.030)
pH: 5 (ref 5.0–8.0)

## 2019-12-06 LAB — BASIC METABOLIC PANEL
Anion gap: 5 (ref 5–15)
BUN: 26 mg/dL — ABNORMAL HIGH (ref 6–20)
CO2: 21 mmol/L — ABNORMAL LOW (ref 22–32)
Calcium: 9 mg/dL (ref 8.9–10.3)
Chloride: 111 mmol/L (ref 98–111)
Creatinine, Ser: 1.87 mg/dL — ABNORMAL HIGH (ref 0.44–1.00)
GFR calc Af Amer: 35 mL/min — ABNORMAL LOW (ref >60)
GFR calc non Af Amer: 30 mL/min — ABNORMAL LOW (ref >60)
Glucose, Bld: 173 mg/dL — ABNORMAL HIGH (ref 70–99)
Potassium: 4.3 mmol/L (ref 3.5–5.1)
Sodium: 137 mmol/L (ref 135–145)

## 2019-12-06 LAB — GLUCOSE, CAPILLARY
Glucose-Capillary: 121 mg/dL — ABNORMAL HIGH (ref 70–99)
Glucose-Capillary: 164 mg/dL — ABNORMAL HIGH (ref 70–99)

## 2019-12-06 LAB — HIV ANTIBODY (ROUTINE TESTING W REFLEX): HIV Screen 4th Generation wRfx: NONREACTIVE

## 2019-12-06 MED ORDER — CEFDINIR 300 MG PO CAPS
300.0000 mg | ORAL_CAPSULE | Freq: Two times a day (BID) | ORAL | 0 refills | Status: AC
Start: 2019-12-06 — End: 2019-12-11

## 2019-12-06 MED ORDER — MYCOPHENOLATE SODIUM 180 MG PO TBEC
360.0000 mg | DELAYED_RELEASE_TABLET | Freq: Three times a day (TID) | ORAL | Status: DC
  Administered 2019-12-06 (×2): 360 mg via ORAL
  Filled 2019-12-06 (×3): qty 2

## 2019-12-06 NOTE — Discharge Summary (Signed)
Physician Discharge Summary  Patient ID: [name] MRN: [id number] DOB/AGE: [birth year] 53 y.o.  Admit date: 12/05/2019 Discharge date: 12/06/2019  Admission Diagnoses: Acute sinusitis Discharge Diagnoses:  Principal Problem:   Sinusitis, acute Active Problems:   Seizure disorder ([place])   Renal transplant, status post   CKD (chronic kidney disease) stage 3, GFR 30-59 ml/min   DM (diabetes mellitus), type 2 with renal complications (HCC)   Diastolic dysfunction without heart failure   Immunosuppressed status ([place])   Acute sinusitis   Discharged Condition: good  Hospital Course:  [name] is a 54 y.o. 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 Patient stated that her symptoms have been present for about a week and have been constant but worsened over the last couple of days.  She reports a history of sinus congestion and frequent sinus infections especially with weather changes.  Normally she uses nasal sprays, antihistamines and Mucinex with resolution of her symptoms however this has not worked at this time.   She feels a lot of nasal congestion and sinus pressure and complains of a sinus type headache.  She feels like the infection has migrated into her chest and complains of chest discomfort and congestion.  She also has a cough which is productive of yellowish-brown sputum.  Chest pain/discomfort as sharp, moderate in severity and left-sided with no radiation.  She has no relieving or aggravating factors. Associated with her symptoms is fever.  Patient had a T-max of 100.1 upon arrival to the emergency room but stated that she was as high as 102 at home prior to her admission. Labs reveal a normal white cell count and  elevated lactic acid level. Chest x-ray shows clear Lungs . Cardiac silhouette normal. No adenopathy  Patient was placed on Rocephin for possible bacterial sinusitis.  Patient condition much better today.  She is afebrile, her lungs are clear.  Her procalcitonin level was less than 0.1.  Due to her immunocompromise status, I will continue 5 days antibiotics with cefdinir.  2 sets of blood culture was obtained, still negative so far.  #1.  Acute sinusitis. Continue 5 days of cefdinir.  2.  Acute kidney injury on chronic kidney disease stage IIIb. Creatinine is better this morning, suspect patient had acute kidney injury at time of admission.  #3.  Status post renal transplant on immunosuppressant treatment.     Consults: None  Significant Diagnostic Studies: X-ray did not show any acute changes.  Cervical x-ray showed degenerative disc disease.  Treatments: Rocephin.  Discharge Exam: Blood pressure 136/90, pulse 69, temperature 98 F (36.7 C), temperature source Oral, resp. rate 18, height 5\' 5"  (1.651 m), weight 88.5 kg, last menstrual period 03/03/2016, SpO2 96 %. General appearance: alert and cooperative Nose: Nares normal. Septum midline. Mucosa normal. No drainage or sinus tenderness., No sinus tenderness Resp: clear to auscultation bilaterally Cardio: regular rate and rhythm, S1, S2 normal, no murmur, click, rub or gallop GI: soft, non-tender; bowel sounds normal; no masses,  no organomegaly Extremities: extremities normal, atraumatic, no cyanosis or edema  Disposition: Discharge disposition: 01-Home or Self Care       Discharge Instructions    Diet - low sodium heart healthy   Complete by: As directed    Increase activity slowly  Complete by: As directed      Allergies as of 12/06/2019      Reactions   Heparin    Heparin induced THrombocytopenia "i bled for a long time and had to have 2 blood transfusions"   Penicillin G    Ibuprofen    Caused kidney  damage Can't take due to kidneys   Other    Other reaction(s): Other (See Comments) Causing HITT   Penicillins    Childhood reaction Patient is unsure if she's allergic, was told she had a reaction as a child but thinks she's taking a "cillin" while sick without having a reaction.  Pt states she has taken amoxicillin in the past and did not have problem      Medication List    TAKE these medications   aspirin EC 81 MG tablet Take 81 mg by mouth daily.   atorvastatin 40 MG tablet Commonly known as: LIPITOR Take 1 tablet (40 mg total) by mouth daily.   carvedilol 12.5 MG tablet Commonly known as: COREG Take 12.5 mg by mouth 2 (two) times daily with a meal.   cefdinir 300 MG capsule Commonly known as: OMNICEF Take 1 capsule (300 mg total) by mouth 2 (two) times daily for 5 days.   cetirizine 10 MG tablet Commonly known as: ZYRTEC Take 1 tablet (10 mg total) by mouth daily as needed for allergies or rhinitis. What changed: when to take this   cholecalciferol 25 MCG (1000 UNIT) tablet Commonly known as: VITAMIN D Take 1,000 Units by mouth daily.   docusate sodium 100 MG capsule Commonly known as: COLACE Take 100 mg by mouth daily.   fluticasone 50 MCG/ACT nasal spray Commonly known as: FLONASE Two sprays each nare twice a day. What changed: See the new instructions.   gabapentin 300 MG capsule Commonly known as: NEURONTIN Take 2 capsules (600 mg total) by mouth 3 (three) times daily. What changed:   how much to take  when to take this  additional instructions   glipiZIDE 5 MG tablet Commonly known as: GLUCOTROL Take 5 mg by mouth daily before breakfast.   HYDROcodone-acetaminophen 5-325 MG tablet Commonly known as: NORCO/VICODIN Take 1 tablet by mouth every 6 (six) hours as needed for moderate pain.   levETIRAcetam 500 MG tablet Commonly known as: KEPPRA Take 500 mg by mouth 2 (two) times daily.   magnesium oxide 400 MG tablet Commonly known as:  MAG-OX Take 400 mg by mouth daily.   mycophenolate 180 MG EC tablet Commonly known as: MYFORTIC Take 360 mg by mouth 3 (three) times daily.   nitroGLYCERIN 0.4 MG SL tablet Commonly known as: NITROSTAT Place 1 tablet (0.4 mg total) under the tongue every 5 (five) minutes x 3 doses as needed for chest pain.   omeprazole 20 MG capsule Commonly known as: PRILOSEC Take 1 capsule (20 mg total) by mouth daily as needed (GERD or heartburn). What changed: when to take this   sodium bicarbonate 650 MG tablet Take 1,300 mg by mouth 3 (three) times daily.   tacrolimus 1 MG capsule Commonly known as: PROGRAF Take 4 mg by mouth 2 (two) times daily.   tamoxifen 20 MG tablet Commonly known as: NOLVADEX Take 20 mg by mouth daily.   valACYclovir 1000 MG tablet Commonly known as: VALTREX Take 1 tablet (1,000 mg total) by mouth 2 (two) times daily. What changed:   when to take this  reasons to take this      Follow-up Information  Johnson, Megan P, DO Follow up in 1 week(s).   Specialty: Family Medicine Contact information: [place] [address] [phone]           Signed: Sharen Hones 12/06/2019, 4:05 PM

## 2019-12-06 NOTE — Care Management Obs Status (Signed)
[place] NOTIFICATION   Patient Details  Name: AERIANNA LOSEY MRN: [id number] Date of Birth: [birth year]   Medicare Observation Status Notification Given:  Fernande Boyden, RN 12/06/2019, 2:37 PM

## 2019-12-07 LAB — URINE CULTURE: Culture: NO GROWTH

## 2019-12-10 LAB — CULTURE, BLOOD (ROUTINE X 2)
Culture: NO GROWTH
Culture: NO GROWTH
Special Requests: ADEQUATE
Special Requests: ADEQUATE

## 2019-12-15 ENCOUNTER — Other Ambulatory Visit: Payer: Self-pay | Admitting: Family Medicine

## 2019-12-15 NOTE — Telephone Encounter (Signed)
Requested medication (s) are due for refill today: Yes  Requested medication (s) are on the active medication list: Yes  Last refill:  11/13/19; 11/04/19  Future visit scheduled: No  Notes to clinic: Noted last refill to schedule appointment, no appointment scheduled will route to provider for approval.     Requested Prescriptions  Pending Prescriptions Disp Refills   valACYclovir (VALTREX) 1000 MG tablet [Pharmacy Med Name: VALACYCLOVIR HCL 1 GRAM TABLET] 20 tablet 0    Sig: Take 1 tablet (1,000 mg total) by mouth 2 (two) times daily.      Antimicrobials:  Antiviral Agents - Anti-Herpetic Passed - 12/15/2019  4:58 PM      Passed - Valid encounter within last 12 months    Recent Outpatient Visits           7 months ago Upper respiratory tract infection, unspecified type   [facility] [place], [place]   1 year ago Upper respiratory tract infection, unspecified type   [facility], Lilia Argue, [place]   1 year ago Pain, dental   [place], [place], DO   1 year ago Diverticulitis   [place], Megan P, DO   1 year ago Atypical chest pain   [facility] [place], Megan P, DO                fluticasone (FLONASE) 50 MCG/ACT nasal spray [Pharmacy Med Name: FLUTICASONE PROPIONATE NS] 16 g 0    Sig: Two sprays each nare twice a day.      Ear, Nose, and Throat: Nasal Preparations - Corticosteroids Passed - 12/15/2019  4:58 PM      Passed - Valid encounter within last 12 months    Recent Outpatient Visits           7 months ago Upper respiratory tract infection, unspecified type   [facility] [place], [place]   1 year ago Upper respiratory tract infection, unspecified type   [facility], Lilia Argue, [place]   1 year ago Pain, dental   [place], [place], DO   1 year ago Diverticulitis   [place], Megan P, DO   1 year ago Atypical chest pain   [place], [place], DO

## 2019-12-15 NOTE — Unmapped (Addendum)
5/14: speaking with patient today re: inability to reach patient since Feb, last delivery in Feb. Pt states she has been in and out of hospital and had a large supply built up. She denies missing any doses Nancy Bowman    [facility] Clinical Assessment & Refill Coordination Note    Nancy Bowman, DOB: Dec 14, 1965  Phone: [phone] (home)     All above HIPAA information was verified with patient.     Was a Nurse, learning disability used for this call? 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 aspirin 81 MG chewable tablet Chew 1 tablet (81 mg total) daily. 30 tablet 11   ??? atorvastatin (LIPITOR) 40 MG tablet Take 1 tablet (40 mg total) by mouth daily. 30 tablet 11   ??? blood sugar diagnostic Strp by Other route two (2) times a day. 100 each 11   ??? blood-glucose meter kit Use as instructed 1 each 0   ??? carvediloL (COREG) 12.5 MG tablet Take 1 tablet (12.5 mg total) by mouth Two (2) times a day. 60 tablet 11   ??? cetirizine (ZYRTEC) 5 MG chewable tablet Chew 5 mg daily.     ??? cholecalciferol, vitamin D3, (VITAMIN D3) 1,000 unit capsule Take 1,000 Units by mouth daily.     ??? fluticasone (FLONASE) 50 mcg/actuation nasal spray Two sprays each nare twice a day. 16 g 5   ??? gabapentin (NEURONTIN) 300 MG capsule Take 1 capsules (300 mg) by mouth in the morning and at noon, 600 mg in the evening. (Patient taking differently: Take 600 mg by mouth Three (3) times a day. Take 1 capsules (300 mg) by mouth in the morning and at noon, 600 mg in the evening.) 360 capsule 11   ??? glipiZIDE (GLUCOTROL) 5 MG tablet Take 1 tablet (5 mg total) by mouth daily. 30 tablet 11   ??? levETIRAcetam (KEPPRA) 500 MG tablet Take 1 tablet (500 mg total) by mouth Two (2) times a day. 60 tablet 11   ??? magnesium oxide (MAG-OX) 400 mg (241.3 mg magnesium) tablet Take 400 mg by mouth daily.     ??? miscellaneous medical supply Misc 1 knee brace 1 each 0   ??? mycophenolate (MYFORTIC) 180 MG EC tablet Take 2 tablets (360 mg total) by mouth Three (3) times a day. 180 tablet 11   ??? omeprazole (PRILOSEC) 20 MG capsule Take 1 capsule (20 mg total) by mouth two (2) times a day. (Patient taking differently: Take 20 mg by mouth two (2) times a day. prn) 180 capsule 3   ??? sodium bicarbonate 650 mg tablet Take 2 tablets (1300 mg) three times a day. 180 tablet 11   ??? tacrolimus (PROGRAF) 1 MG capsule Take 4 capsules (4 mg total) by mouth two (2) times a day. 240 capsule 11   ??? tamoxifen (NOLVADEX) 20 MG tablet Take 1 tablet (20 mg total) by mouth daily. 90 tablet 4   ??? traMADoL (ULTRAM) 50 mg tablet Take 1 tablet (50 mg total) by mouth every eight (8) hours as needed for pain. 60 tablet 2   ??? valACYclovir (VALTREX) 500 MG tablet Take 500 mg by mouth daily as needed.       No current facility-administered medications for this visit.        Changes to medications: [name] reports no changes at this time.  Allergies   Allergen Reactions   ??? 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 Ibuprofen      Can't take due to kidneys   ??? Penicillins      Patient is unsure if she's allergic, was told she had a reaction as a child but thinks she's taking a cillin while sick without having a reaction.  Pt states she has taken amoxicillin in the past and did not have problem       Changes to allergies: No    SPECIALTY MEDICATION ADHERENCE     Mycophenolate 180mg   : 4 days of medicine on hand   Tacrolimus 1mg   : 4 days of medicine on hand     Medication Adherence    Patient reported X missed doses in the last month: 0  Specialty Medication: tacrolimus 1mg   Patient is on additional specialty medications: Yes  Additional Specialty Medications: Mycophenolate 180mg   Patient Reported Additional Medication X Missed Doses in the Last Month: 0  Adherence tools used: patient uses a pill box to manage medications          Specialty medication(s) dose(s) confirmed: Regimen is correct and unchanged.     Are there any concerns with adherence? No    Adherence counseling provided? Not needed    CLINICAL MANAGEMENT AND INTERVENTION      Clinical Benefit Assessment:    Do you feel the medicine is effective or helping your condition? Yes    Clinical Benefit counseling provided? Not needed    Adverse Effects Assessment:    Are you experiencing any side effects? No    Are you experiencing difficulty administering your medicine? No    Quality of Life Assessment:    How many days over the past month did your transplant  keep you from your normal activities? For example, brushing your teeth or getting up in the morning. 0    Have you discussed this with your provider? Not needed    Therapy Appropriateness:    Is therapy appropriate? Yes, therapy is appropriate and should be continued    DISEASE/MEDICATION-SPECIFIC INFORMATION      N/A    PATIENT SPECIFIC NEEDS     - Does the patient have any physical, cognitive, or cultural barriers? No    - Is the patient high risk? Yes, patient is taking a REMS drug. Medication is dispensed in compliance with REMS program.     - Does the patient require a Care Management Plan? No     - Does the patient require physician intervention or other additional services (i.e. nutrition, smoking cessation, social work)? No      SHIPPING     Specialty Medication(s) to be Shipped:   Transplant:  mycophenolic acid 180mg  and tacrolimus 1mg     Other medication(s) to be shipped: na     Changes to insurance: No    Delivery Scheduled: Yes, Expected medication delivery date: 12/18/2019.     Medication will be delivered via Same Day Courier to the confirmed prescription address in [facility].    The patient will receive a drug information handout for each medication shipped and additional FDA Medication Guides as required.  Verified that patient has previously received a Conservation officer, historic buildings.    All of the patient's questions and concerns have been addressed.    Thad Ranger   [facility] Specialty Pharmacist

## 2019-12-18 ENCOUNTER — Telehealth: Payer: Self-pay | Admitting: Family Medicine

## 2019-12-18 MED ORDER — VALACYCLOVIR 500 MG TABLET
ORAL_TABLET | Freq: Every day | ORAL | 0 refills | 30 days | Status: CP
Start: 2019-12-18 — End: ?

## 2019-12-18 MED ORDER — GLIPIZIDE 5 MG TABLET
ORAL_TABLET | Freq: Every day | ORAL | 11 refills | 30.00000 days | Status: CP
Start: 2019-12-18 — End: 2020-12-17

## 2019-12-18 MED ORDER — ATORVASTATIN 40 MG TABLET
ORAL_TABLET | Freq: Every day | ORAL | 11 refills | 30 days | Status: CP
Start: 2019-12-18 — End: 2020-12-17

## 2019-12-18 MED FILL — MYCOPHENOLATE SODIUM 180 MG TABLET,DELAYED RELEASE: 30 days supply | Qty: 180 | Fill #10 | Status: AC

## 2019-12-18 MED FILL — TACROLIMUS 1 MG CAPSULE, IMMEDIATE-RELEASE: ORAL | 30 days supply | Qty: 240 | Fill #10

## 2019-12-18 MED FILL — TACROLIMUS 1 MG CAPSULE, IMMEDIATE-RELEASE: 30 days supply | Qty: 240 | Fill #10 | Status: AC

## 2019-12-18 MED FILL — MYCOPHENOLATE SODIUM 180 MG TABLET,DELAYED RELEASE: ORAL | 30 days supply | Qty: 180 | Fill #10

## 2019-12-18 NOTE — Telephone Encounter (Signed)
Patient last seen over a year ago. I'm more than happy to see her, but I will need her to schedule an appointment for any refills.

## 2019-12-18 NOTE — Telephone Encounter (Signed)
Called pt to schedule appt, no answer, left vm 

## 2019-12-18 NOTE — Telephone Encounter (Signed)
Lvm to make this apt. 

## 2019-12-18 NOTE — Telephone Encounter (Signed)
Called pt back to schedule advised to pt that we could get her scheduled and see if provider can give her enough to last to her app, pt got upset and said as long as she has been out of the medicine she will contact her doctor in [place] to fill it. States to take her off of the pt list and she will find another doctor. Pt ended the call.   Copied from [place] [phone]. Topic: Appointment Scheduling - Scheduling Inquiry for Clinic >> Dec 18, 2019  1:41 PM Jaynie Collins D wrote: Reason for CRM: Patient is requesting refill medication, but needs to schedule an appt, first with PCP per notes from PCP. Pt refused to schedule appt and stated that PCP needs to  contact her first.

## 2019-12-18 NOTE — Telephone Encounter (Signed)
Needs appointment

## 2019-12-18 NOTE — Telephone Encounter (Signed)
Routing to provider  

## 2019-12-18 NOTE — Unmapped (Signed)
Received page. Attempted to call patient back. No answer, left vm stating if she still needed me to please call the operator and have them page on call TNC.

## 2019-12-18 NOTE — Unmapped (Signed)
Returned patient's call. Pt called to c/o primary doctor will not refill meds. She says I keep running out of my  meds and they won't fill them without seeing me. I am out of my sugar medicine, cholesterol, quite a few Pt doesn't want to go back to PCP, Dr. Olevia Perches. Pt reports she is tired of miscommunication at her doctor's office. I told her most providers want patient to be seen annually. Pt said it is her right to change so she will no longer go back but she needs her refills. PCP's office was going to send prescriptions needed to Dr. Carlyle Basques office per patient request. Told her I would let Olegario Messier, her primary TNC know. She said she tried calling her as well but her voicemail is full.     Routed message to primary TNC

## 2019-12-19 NOTE — Telephone Encounter (Signed)
Duplicate encounter. Pt declined appt. Routing to close.

## 2019-12-22 IMAGING — US US RENAL TRANSPLANT
1 series · 13 of 25 positions shown · non-contrast
Comparison: CT of the abdomen and pelvis on 02/16/2011

CLINICAL DATA: Delays. Fifteen. Decreased urine output. Symptoms
for 1 week. History of renal transplant.

EXAM:
ULTRASOUND OF RENAL TRANSPLANT WITH RENAL DOPPLER ULTRASOUND
TECHNIQUE: Ultrasound examination of the renal transplant was performed with
gray-scale, color and duplex doppler evaluation.

[Series 1: us renal transplant · 13 of 34 slices shown]
[im 1/34]
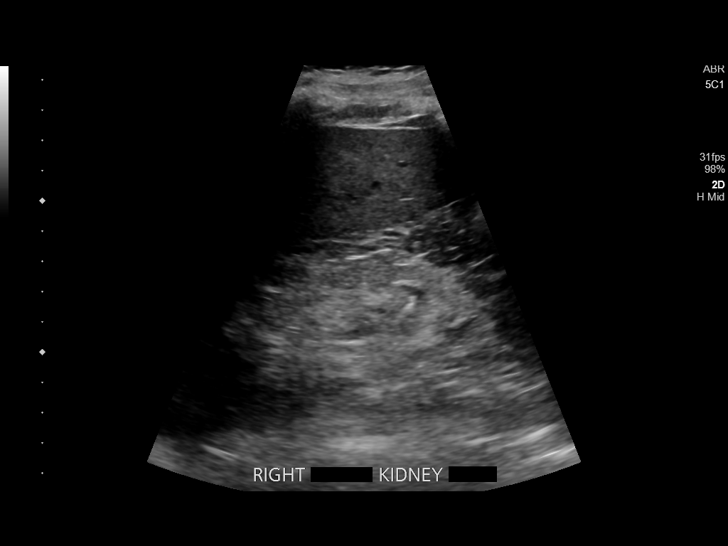
[im 3/34]
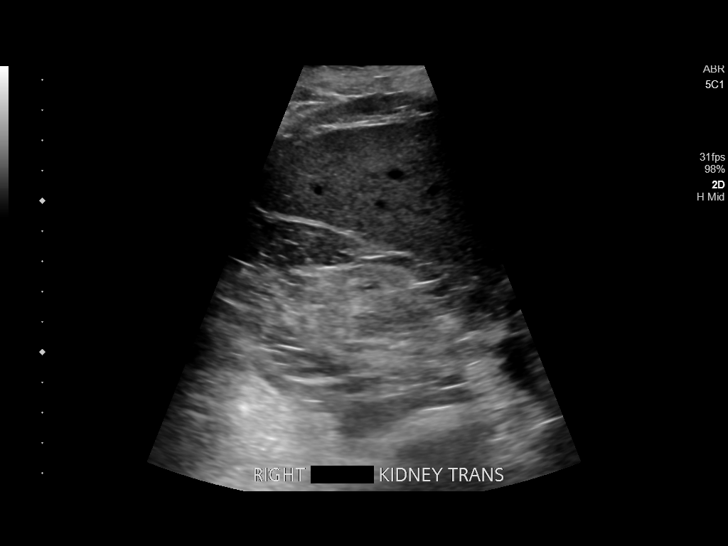
[im 6/34]
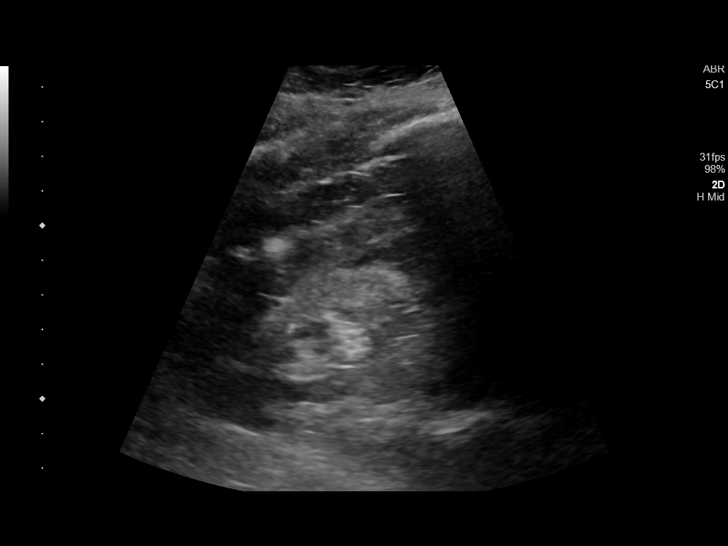
[im 9/34]
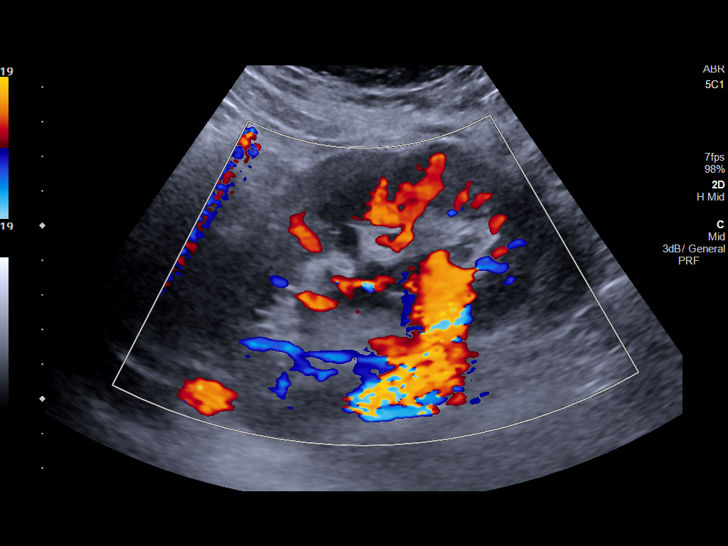
[im 12/34]
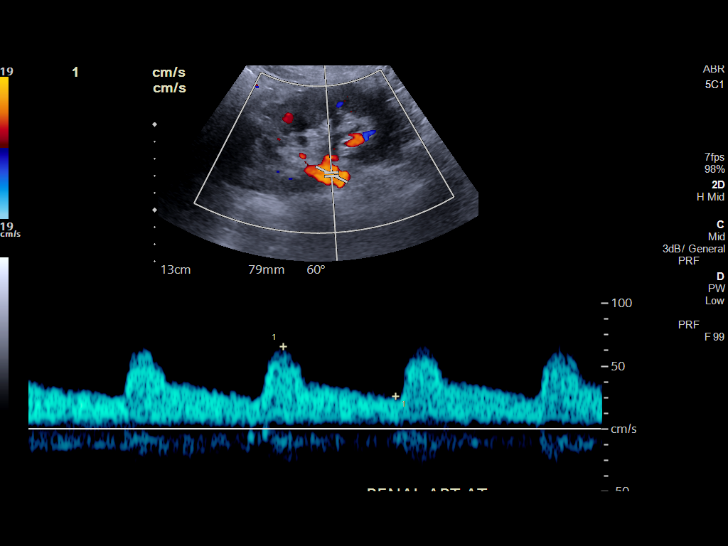
[im 14/34]
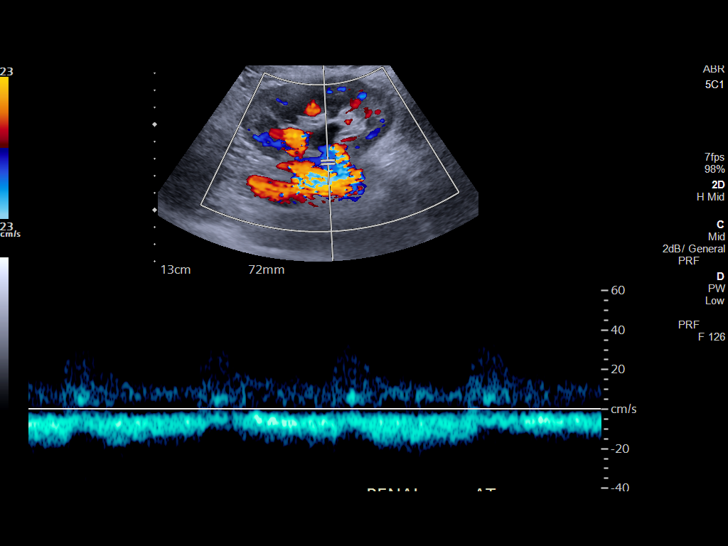
[im 17/34]
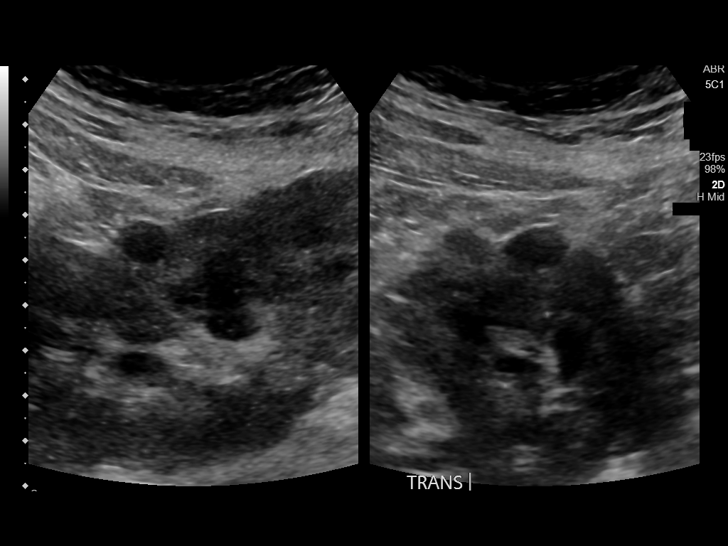
[im 20/34]
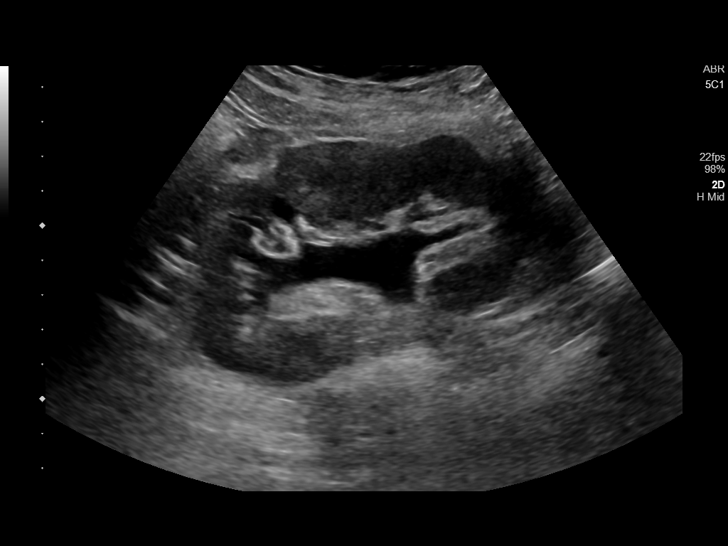
[im 23/34]
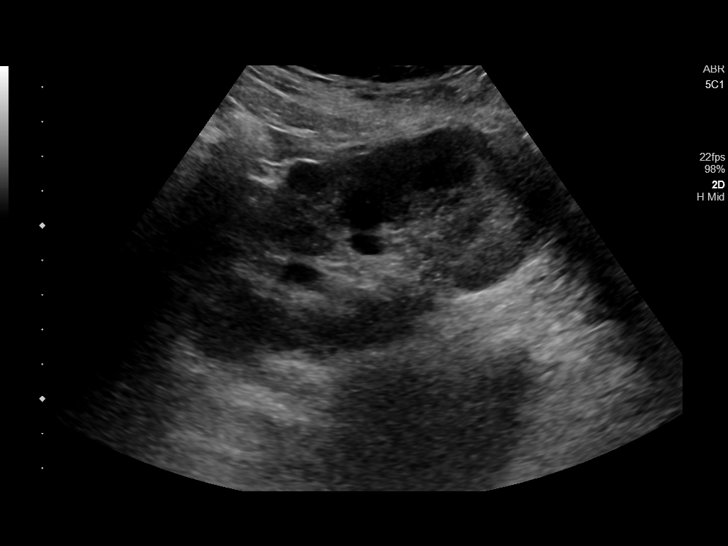
[im 25/34]
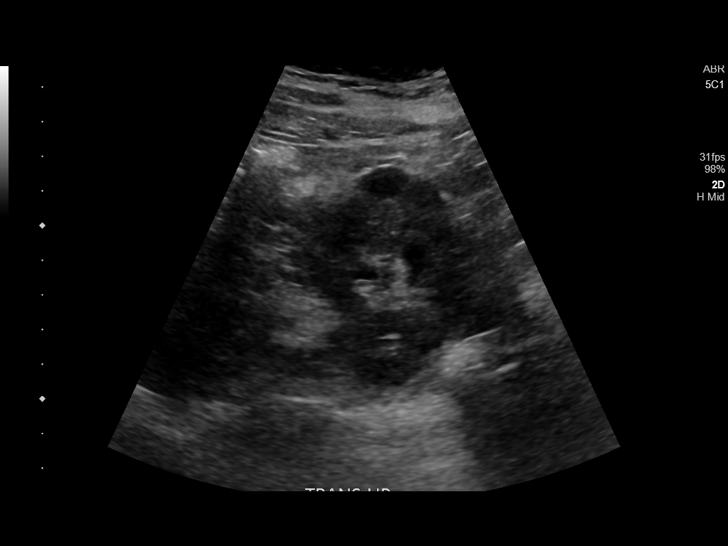
[im 28/34]
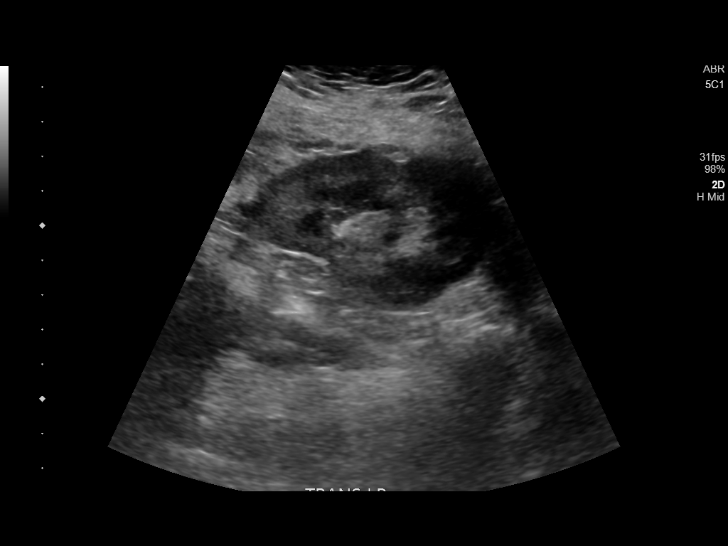
[im 31/34]
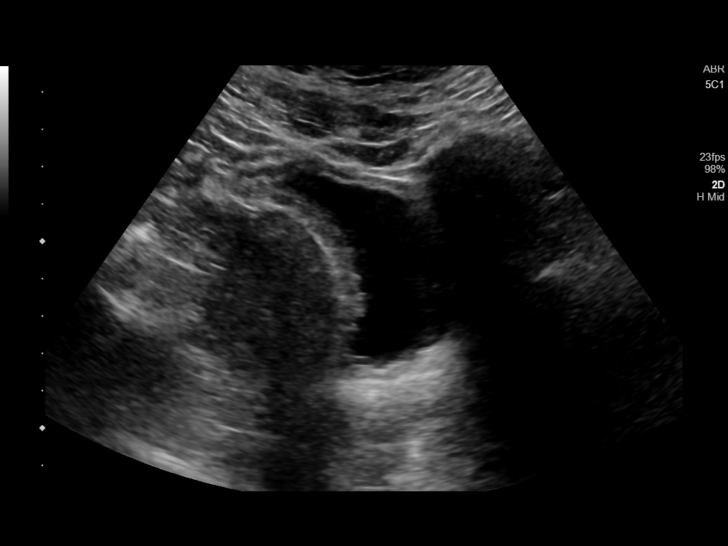
[im 34/34]
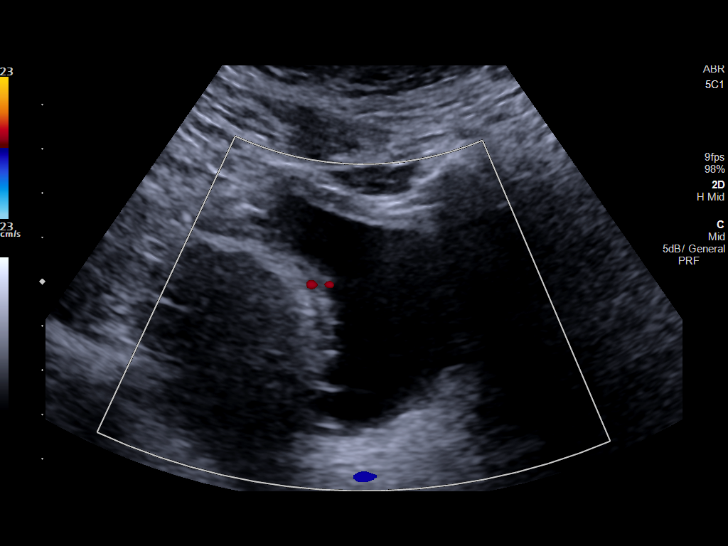

[13 of 25 positions shown; findings below may reference images not displayed]

FINDINGS: Transplant kidney location: RIGHT LOWER abdomen

Transplant Kidney:

Length: 11.3 centimeters. Renal parenchyma appears normal. There is
moderate hydronephrosis. A midpole cyst is 1.3 x 1.0 x
centimeters.

Color flow in the main renal artery:  Present

Color flow in the main renal vein:  Present

Duplex Doppler Evaluation:

Main Renal Artery Resistive Index:

Venous waveform in main renal vein:  Present

Intrarenal resistive index in upper pole:

(normal 0.6-0.8; equivocal 0.8-0.9; abnormal >= 0.9)

Intrarenal resistive index in lower pole:

(normal 0.6-0.8; equivocal 0.8-0.9; abnormal >= 0.9)

Bladder: Normal for degree of bladder distention. Ureteral jets are
not visualized.

Other findings:  Echogenic native LEFT and RIGHT kidneys.
IMPRESSION: 1. Normal color and pulsed wave Doppler interrogation of transplant
main renal artery.
2. Normal appearance of the transplant main renal vein.
3. Hydronephrosis of the transplant kidney.

## 2020-01-03 ENCOUNTER — Other Ambulatory Visit: Payer: Self-pay | Admitting: Family Medicine

## 2020-01-08 DIAGNOSIS — Z940 Kidney transplant status: Principal | ICD-10-CM

## 2020-01-08 MED ORDER — TACROLIMUS 1 MG CAPSULE, IMMEDIATE-RELEASE
ORAL_CAPSULE | Freq: Two times a day (BID) | ORAL | 11 refills | 30.00000 days
Start: 2020-01-08 — End: 2021-01-07

## 2020-01-08 MED ORDER — MYCOPHENOLATE SODIUM 180 MG TABLET,DELAYED RELEASE
ORAL_TABLET | Freq: Three times a day (TID) | ORAL | 11 refills | 30.00000 days
Start: 2020-01-08 — End: 2021-01-07

## 2020-01-08 NOTE — Unmapped (Signed)
[facility] Refill Coordination Note    Specialty Medication(s) to be Shipped:   Transplant: mycophenolate mofetil 180mg  and tacrolimus 1mg     Other medication(s) to be shipped:       Nancy Bowman, DOB: 1966-02-26  Phone: [phone] (home)       All above HIPAA information was verified with patient.     Was a Nurse, learning disability used for this call? No    Completed refill call assessment today to schedule patient's medication shipment from the [facility] [phone]).       Specialty medication(s) and dose(s) confirmed: Regimen is correct and unchanged.   Changes to medications: Stefani reports no changes at this time.  Changes to insurance: No  Questions for the pharmacist: No    Confirmed patient received Welcome Packet with first shipment. The patient will receive a drug information handout for each medication shipped and additional FDA Medication Guides as required.       DISEASE/MEDICATION-SPECIFIC INFORMATION        N/A    SPECIALTY MEDICATION ADHERENCE     Medication Adherence    Patient reported X missed doses in the last month: 0  Specialty Medication: Mycophenolate 180mg   Patient is on additional specialty medications: Yes  Additional Specialty Medications: Tacrolimus 1mg   Patient is on more than two specialty medications: No  Informant: spouse  Reliability of informant: reliable  Patient is at risk for Non-Adherence: No  Adherence tools used: patient uses a pill box to manage medications                tacrolimus 1 mg:  11 days of medicine on hand   Mycophenolate 180 mg: 11 days of medicine on hand         SHIPPING     Shipping address confirmed in Epic.     Delivery Scheduled: Yes, Expected medication delivery date: 06/14.     Medication will be delivered via UPS to the prescription address in Epic [facility].    Antonietta Barcelona   [facility] Specialty Technician

## 2020-01-09 MED ORDER — MYCOPHENOLATE SODIUM 180 MG TABLET,DELAYED RELEASE
ORAL_TABLET | Freq: Three times a day (TID) | ORAL | 11 refills | 30.00000 days | Status: CP
Start: 2020-01-09 — End: 2021-01-08
  Filled 2020-01-12: qty 180, 30d supply, fill #0

## 2020-01-12 MED FILL — MYCOPHENOLATE SODIUM 180 MG TABLET,DELAYED RELEASE: 30 days supply | Qty: 180 | Fill #0 | Status: AC

## 2020-01-12 MED FILL — TACROLIMUS 1 MG CAPSULE, IMMEDIATE-RELEASE: ORAL | 30 days supply | Qty: 240 | Fill #0

## 2020-01-12 MED FILL — TACROLIMUS 1 MG CAPSULE, IMMEDIATE-RELEASE: 30 days supply | Qty: 240 | Fill #0 | Status: AC

## 2020-02-13 NOTE — Unmapped (Signed)
[facility] Clinical Assessment & Refill Coordination Note    Nancy Bowman, DOB: Nov 15, 1965  Phone: [phone] (home)     All above HIPAA information was verified with patient.     Was a Nurse, learning disability used for this call? 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 aspirin 81 MG chewable tablet Chew 1 tablet (81 mg total) daily. 30 tablet 11   ??? atorvastatin (LIPITOR) 40 MG tablet Take 1 tablet (40 mg total) by mouth daily. 30 tablet 11   ??? blood sugar diagnostic Strp by Other route two (2) times a day. 100 each 11   ??? blood-glucose meter kit Use as instructed 1 each 0   ??? carvediloL (COREG) 12.5 MG tablet Take 1 tablet (12.5 mg total) by mouth Two (2) times a day. 60 tablet 11   ??? cetirizine (ZYRTEC) 5 MG chewable tablet Chew 5 mg daily.     ??? cholecalciferol, vitamin D3, (VITAMIN D3) 1,000 unit capsule Take 1,000 Units by mouth daily.     ??? fluticasone (FLONASE) 50 mcg/actuation nasal spray Two sprays each nare twice a day. 16 g 5   ??? gabapentin (NEURONTIN) 300 MG capsule Take 1 capsules (300 mg) by mouth in the morning and at noon, 600 mg in the evening. (Patient taking differently: Take 600 mg by mouth Three (3) times a day. Take 1 capsules (300 mg) by mouth in the morning and at noon, 600 mg in the evening.) 360 capsule 11   ??? glipiZIDE (GLUCOTROL) 5 MG tablet Take 1 tablet (5 mg total) by mouth daily. 30 tablet 11   ??? levETIRAcetam (KEPPRA) 500 MG tablet Take 1 tablet (500 mg total) by mouth Two (2) times a day. 60 tablet 11   ??? magnesium oxide (MAG-OX) 400 mg (241.3 mg magnesium) tablet Take 400 mg by mouth daily.     ??? miscellaneous medical supply Misc 1 knee brace 1 each 0   ??? mycophenolate (MYFORTIC) 180 MG EC tablet Take 2 tablets (360 mg total) by mouth Three (3) times a day. 180 tablet 11   ??? omeprazole (PRILOSEC) 20 MG capsule Take 1 capsule (20 mg total) by mouth two (2) times a day. (Patient taking differently: Take 20 mg by mouth two (2) times a day. prn) 180 capsule 3   ??? sodium bicarbonate 650 mg tablet Take 2 tablets (1300 mg) three times a day. 180 tablet 11   ??? tacrolimus (PROGRAF) 1 MG capsule Take 4 capsules (4 mg total) by mouth two (2) times a day. 240 capsule 11   ??? tamoxifen (NOLVADEX) 20 MG tablet Take 1 tablet (20 mg total) by mouth daily. 90 tablet 4   ??? traMADoL (ULTRAM) 50 mg tablet Take 1 tablet (50 mg total) by mouth every eight (8) hours as needed for pain. 60 tablet 2   ??? valACYclovir (VALTREX) 500 MG tablet Take 1 tablet (500 mg total) by mouth daily. 30 tablet 0     No current facility-administered medications for this visit.        Changes to medications: Nancy Bowman reports no changes at this time.    Allergies   Allergen Reactions   ??? 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 Ibuprofen      Can't take due to kidneys   ??? Penicillins  Patient is unsure if she's allergic, was told she had a reaction as a child but thinks she's taking a cillin while sick without having a reaction.  Pt states she has taken amoxicillin in the past and did not have problem       Changes to allergies: No    SPECIALTY MEDICATION ADHERENCE     Mycophenolate 180 mg: 4 days of medicine on hand   Tacrolimus 1 mg: 4 days of medicine on hand       Medication Adherence    Patient reported X missed doses in the last month: 0  Specialty Medication: Mycophenolate 180mg   Patient is on additional specialty medications: Yes  Additional Specialty Medications: Tacrolimus 1mg   Patient Reported Additional Medication X Missed Doses in the Last Month: 0  Patient is on more than two specialty medications: No  Adherence tools used: patient uses a pill box to manage medications          Specialty medication(s) dose(s) confirmed: Regimen is correct and unchanged.     Are there any concerns with adherence? No    Adherence counseling provided? Not needed CLINICAL MANAGEMENT AND INTERVENTION      Clinical Benefit Assessment:    Do you feel the medicine is effective or helping your condition? Yes    Clinical Benefit counseling provided? Not needed    Adverse Effects Assessment:    Are you experiencing any side effects? No    Are you experiencing difficulty administering your medicine? No    Quality of Life Assessment:    How many days over the past month did your kidney transplant  keep you from your normal activities? For example, brushing your teeth or getting up in the morning. patient needs help daily and receives it from her daughter.    Have you discussed this with your provider? Not needed    Therapy Appropriateness:    Is therapy appropriate? Yes, therapy is appropriate and should be continued    DISEASE/MEDICATION-SPECIFIC INFORMATION      N/A    PATIENT SPECIFIC NEEDS     - Does the patient have any physical, cognitive, or cultural barriers? No    - Is the patient high risk? Yes, patient is taking a REMS drug. Medication is dispensed in compliance with REMS program.     - Does the patient require a Care Management Plan? No     - Does the patient require physician intervention or other additional services (i.e. nutrition, smoking cessation, social work)? No      SHIPPING     Specialty Medication(s) to be Shipped:   Transplant:  mycophenolic acid 180mg  and tacrolimus 1mg     Other medication(s) to be shipped: none     Changes to insurance: No    Delivery Scheduled: Yes, Expected medication delivery date: 02/16/20.     Medication will be delivered via UPS to the confirmed prescription address in [facility].    The patient will receive a drug information handout for each medication shipped and additional FDA Medication Guides as required.  Verified that patient has previously received a Conservation officer, historic buildings.    All of the patient's questions and concerns have been addressed.    [name]   [facility] Specialty Pharmacist

## 2020-02-15 ENCOUNTER — Other Ambulatory Visit: Payer: Self-pay | Admitting: Family Medicine

## 2020-02-15 MED FILL — MYCOPHENOLATE SODIUM 180 MG TABLET,DELAYED RELEASE: 30 days supply | Qty: 180 | Fill #1 | Status: AC

## 2020-02-15 MED FILL — TACROLIMUS 1 MG CAPSULE, IMMEDIATE-RELEASE: 30 days supply | Qty: 240 | Fill #1 | Status: AC

## 2020-02-15 MED FILL — MYCOPHENOLATE SODIUM 180 MG TABLET,DELAYED RELEASE: ORAL | 30 days supply | Qty: 180 | Fill #1

## 2020-02-15 MED FILL — TACROLIMUS 1 MG CAPSULE, IMMEDIATE-RELEASE: ORAL | 30 days supply | Qty: 240 | Fill #1

## 2020-02-26 ENCOUNTER — Other Ambulatory Visit: Payer: Self-pay | Admitting: Family Medicine

## 2020-03-25 ENCOUNTER — Encounter: Payer: Self-pay | Admitting: Emergency Medicine

## 2020-03-25 ENCOUNTER — Emergency Department: Payer: Medicare Other

## 2020-03-25 ENCOUNTER — Other Ambulatory Visit: Payer: Self-pay

## 2020-03-25 ENCOUNTER — Emergency Department
Admission: EM | Admit: 2020-03-25 | Discharge: 2020-03-25 | Disposition: A | Discharge status: Home / Self Care | Payer: Medicare Other | Attending: Emergency Medicine | Admitting: Emergency Medicine

## 2020-03-25 DIAGNOSIS — R079 Chest pain, unspecified: Secondary | ICD-10-CM | POA: Diagnosis not present

## 2020-03-25 DIAGNOSIS — R002 Palpitations: Secondary | ICD-10-CM | POA: Insufficient documentation

## 2020-03-25 DIAGNOSIS — R519 Headache, unspecified: Secondary | ICD-10-CM | POA: Insufficient documentation

## 2020-03-25 DIAGNOSIS — Z20822 Contact with and (suspected) exposure to covid-19: Secondary | ICD-10-CM | POA: Insufficient documentation

## 2020-03-25 DIAGNOSIS — R509 Fever, unspecified: Secondary | ICD-10-CM | POA: Diagnosis not present

## 2020-03-25 DIAGNOSIS — Z949 Transplanted organ and tissue status, unspecified: Secondary | ICD-10-CM | POA: Diagnosis not present

## 2020-03-25 LAB — CBC
HCT: 39.8 % (ref 36.0–46.0)
Hemoglobin: 12.9 g/dL (ref 12.0–15.0)
MCH: 30.9 pg (ref 26.0–34.0)
MCHC: 32.4 g/dL (ref 30.0–36.0)
MCV: 95.2 fL (ref 80.0–100.0)
Platelets: 215 10*3/uL (ref 150–400)
RBC: 4.18 MIL/uL (ref 3.87–5.11)
RDW: 13.2 % (ref 11.5–15.5)
WBC: 7.7 10*3/uL (ref 4.0–10.5)
nRBC: 0 % (ref 0.0–0.2)

## 2020-03-25 LAB — BASIC METABOLIC PANEL
Anion gap: 10 (ref 5–15)
BUN: 39 mg/dL — ABNORMAL HIGH (ref 6–20)
CO2: 19 mmol/L — ABNORMAL LOW (ref 22–32)
Calcium: 10.3 mg/dL (ref 8.9–10.3)
Chloride: 107 mmol/L (ref 98–111)
Creatinine, Ser: 2.16 mg/dL — ABNORMAL HIGH (ref 0.44–1.00)
GFR calc Af Amer: 29 mL/min — ABNORMAL LOW (ref >60)
GFR calc non Af Amer: 25 mL/min — ABNORMAL LOW (ref >60)
Glucose, Bld: 210 mg/dL — ABNORMAL HIGH (ref 70–99)
Potassium: 4.9 mmol/L (ref 3.5–5.1)
Sodium: 136 mmol/L (ref 135–145)

## 2020-03-25 LAB — TROPONIN I (HIGH SENSITIVITY): Troponin I (High Sensitivity): 10 ng/L (ref <18)

## 2020-03-25 NOTE — ED Triage Notes (Signed)
Pt states daughter tested positive for Covid 2 days ago, fiance tested positive for Covid yesterday. Pt states woke up today with temp of 100 at home. Pt states "my chest feels funny like I'm having palpitations". Pt also c/o HA and back pain at this time. Pt A&O x4, NAD noted at this time. Pt also states is a transplant patient, states takes anti rejection medications.

## 2020-03-26 NOTE — Unmapped (Signed)
The [facility] has made a third and final attempt to reach this patient to refill the following medication:mycophenolate, tacrolimus.      We have been unable to leave messages on the following phone numbers: [phone] . rejection of call cannot be completed.    Dates contacted: 8/5, 8/9, 8/24  Last scheduled delivery: 7/15    The patient may be at risk of non-compliance with this medication. The patient should call the [facility] at [phone] (option 4) to refill medication.    Nancy Bowman   [facility] Specialty Pharmacist

## 2020-04-01 NOTE — Unmapped (Signed)
UNOS form

## 2020-04-09 ENCOUNTER — Other Ambulatory Visit: Payer: Self-pay | Admitting: Family Medicine

## 2020-04-11 ENCOUNTER — Other Ambulatory Visit: Payer: Self-pay | Admitting: Family Medicine

## 2020-04-11 MED FILL — TACROLIMUS 1 MG CAPSULE, IMMEDIATE-RELEASE: ORAL | 30 days supply | Qty: 240 | Fill #2

## 2020-04-11 MED FILL — MYCOPHENOLATE SODIUM 180 MG TABLET,DELAYED RELEASE: ORAL | 30 days supply | Qty: 180 | Fill #2

## 2020-04-11 MED FILL — MYCOPHENOLATE SODIUM 180 MG TABLET,DELAYED RELEASE: 30 days supply | Qty: 180 | Fill #2 | Status: AC

## 2020-04-11 MED FILL — TACROLIMUS 1 MG CAPSULE, IMMEDIATE-RELEASE: 30 days supply | Qty: 240 | Fill #2 | Status: AC

## 2020-04-11 NOTE — Unmapped (Signed)
[facility] Refill Coordination Note    Specialty Medication(s) to be Shipped:   Transplant: mycophenolate mofetil 180mg  and tacrolimus 1mg     Other medication(s) to be shipped: No additional medications requested for fill at this time     Nancy Bowman, DOB: 04/11/1966  Phone: [phone] (home)       All above HIPAA information was verified with patient.     Was a Nurse, learning disability used for this call? No    Completed refill call assessment today to schedule patient's medication shipment from the [facility] [phone]).       Specialty medication(s) and dose(s) confirmed: Regimen is correct and unchanged.   Changes to medications: Nancy Bowman reports no changes at this time.  Changes to insurance: No  Questions for the pharmacist: No    Confirmed patient received Welcome Packet with first shipment. The patient will receive a drug information handout for each medication shipped and additional FDA Medication Guides as required.       DISEASE/MEDICATION-SPECIFIC INFORMATION        N/A    SPECIALTY MEDICATION ADHERENCE     Medication Adherence    Adherence tools used: patient uses a pill box to manage medications                mycophenolate 500 mg: 4 days of medicine on hand   Tacrolimus 1 mg: 4 days of medicine on hand         SHIPPING     Shipping address confirmed in Epic.     Delivery Scheduled: Yes, Expected medication delivery date: 09/10.     Medication will be delivered via Next Day Courier to the prescription address in Epic W[facility].    Nancy Bowman   [facility] Specialty Technician

## 2020-04-15 DIAGNOSIS — K219 Gastro-esophageal reflux disease without esophagitis: Principal | ICD-10-CM

## 2020-04-15 DIAGNOSIS — Z940 Kidney transplant status: Principal | ICD-10-CM

## 2020-04-15 MED ORDER — OMEPRAZOLE 20 MG CAPSULE,DELAYED RELEASE
ORAL_CAPSULE | Freq: Every day | ORAL | 11 refills | 30 days | Status: CP
Start: 2020-04-15 — End: ?

## 2020-04-16 NOTE — Unmapped (Signed)
Attempted to call patient regarding Valcyclovir refill request  No answer, unable to leave VM    Sent my chart message.

## 2020-04-19 NOTE — Unmapped (Signed)
Called and tried to reach the patient (3rd attempt) without any progress.

## 2020-04-23 DIAGNOSIS — E559 Vitamin D deficiency, unspecified: Principal | ICD-10-CM

## 2020-04-23 DIAGNOSIS — Z940 Kidney transplant status: Principal | ICD-10-CM

## 2020-04-23 DIAGNOSIS — E872 Acidosis: Principal | ICD-10-CM

## 2020-04-23 DIAGNOSIS — K219 Gastro-esophageal reflux disease without esophagitis: Principal | ICD-10-CM

## 2020-04-23 DIAGNOSIS — N186 End stage renal disease: Principal | ICD-10-CM

## 2020-04-23 MED ORDER — CHOLECALCIFEROL (VITAMIN D3) 25 MCG (1,000 UNIT) CAPSULE
ORAL_CAPSULE | Freq: Every day | ORAL | 11 refills | 30 days | Status: CP
Start: 2020-04-23 — End: ?

## 2020-04-23 MED ORDER — OMEPRAZOLE 20 MG CAPSULE,DELAYED RELEASE
ORAL_CAPSULE | Freq: Every day | ORAL | 11 refills | 30.00000 days | Status: CP
Start: 2020-04-23 — End: ?

## 2020-04-23 MED ORDER — SODIUM BICARBONATE 650 MG TABLET
ORAL_TABLET | 11 refills | 0 days | Status: CP
Start: 2020-04-23 — End: ?

## 2020-04-23 NOTE — Unmapped (Signed)
Called pt. Contact number, no answer and not able to leave VM  Called pt. Secondary contact, left message and call back #    Called pharmacy to check in regarding Valacyclovir   Received new phone #     Pt. Has history of herpes flares, and she is out at this time.     Sent Rx Refill for Valacyclovir per Dr. Carlene Coria to local pharmacy.    Pt. Has received both COVID vaccines and will be getting her booster shot.     Pt. Knows she is due for her appt and annual imaging. Scheduler aware.

## 2020-04-24 DIAGNOSIS — B009 Herpesviral infection, unspecified: Principal | ICD-10-CM

## 2020-04-24 MED ORDER — VALACYCLOVIR 500 MG TABLET
ORAL_TABLET | Freq: Every day | ORAL | 0 refills | 30.00000 days | Status: CP
Start: 2020-04-24 — End: ?

## 2020-05-06 NOTE — Unmapped (Signed)
[facility] Refill Coordination Note    Specialty Medication(s) to be Shipped:   Transplant: mycophenolate mofetil 180mg  and tacrolimus 1mg     Other medication(s) to be shipped: No additional medications requested for fill at this time     Nancy Bowman, DOB: Mar 26, 1966  Phone: [phone] (home)       All above HIPAA information was verified with patient.     Was a Nurse, learning disability used for this call? No    Completed refill call assessment today to schedule patient's medication shipment from the [facility] [phone]).       Specialty medication(s) and dose(s) confirmed: Regimen is correct and unchanged.   Changes to medications: Desta reports no changes at this time.  Changes to insurance: No  Questions for the pharmacist: No    Confirmed patient received Welcome Packet with first shipment. The patient will receive a drug information handout for each medication shipped and additional FDA Medication Guides as required.       DISEASE/MEDICATION-SPECIFIC INFORMATION        N/A    SPECIALTY MEDICATION ADHERENCE     Medication Adherence    Patient reported X missed doses in the last month: 0  Specialty Medication: Mycophenolate 180mg   Patient is on additional specialty medications: Yes  Additional Specialty Medications: Tacrolimus 1mg   Patient Reported Additional Medication X Missed Doses in the Last Month: 0  Patient is on more than two specialty medications: No  Adherence tools used: patient uses a pill box to manage medications        Mycophenolate 180 mg: 5 days of medicine on hand   Tacrolimus 1 mg: 5 days of medicine on hand     SHIPPING     Shipping address confirmed in Epic.     Delivery Scheduled: Yes, Expected medication delivery date: 05/09/2020.     Medication will be delivered via UPS to the prescription address in Epic WAM.    Lorelei Pont [facility] Specialty Technician

## 2020-05-08 MED FILL — TACROLIMUS 1 MG CAPSULE, IMMEDIATE-RELEASE: 30 days supply | Qty: 240 | Fill #3 | Status: AC

## 2020-05-08 MED FILL — MYCOPHENOLATE SODIUM 180 MG TABLET,DELAYED RELEASE: ORAL | 30 days supply | Qty: 180 | Fill #3

## 2020-05-08 MED FILL — MYCOPHENOLATE SODIUM 180 MG TABLET,DELAYED RELEASE: 30 days supply | Qty: 180 | Fill #3 | Status: AC

## 2020-05-08 MED FILL — TACROLIMUS 1 MG CAPSULE, IMMEDIATE-RELEASE: ORAL | 30 days supply | Qty: 240 | Fill #3

## 2020-05-18 DIAGNOSIS — Z940 Kidney transplant status: Principal | ICD-10-CM

## 2020-05-18 MED ORDER — LEVETIRACETAM 500 MG TABLET
ORAL_TABLET | Freq: Two times a day (BID) | ORAL | 0 refills | 0 days
Start: 2020-05-18 — End: ?

## 2020-05-20 DIAGNOSIS — I10 Essential (primary) hypertension: Principal | ICD-10-CM

## 2020-05-20 DIAGNOSIS — Z940 Kidney transplant status: Principal | ICD-10-CM

## 2020-05-20 DIAGNOSIS — E872 Acidosis: Principal | ICD-10-CM

## 2020-05-20 DIAGNOSIS — Z79899 Other long term (current) drug therapy: Principal | ICD-10-CM

## 2020-05-20 DIAGNOSIS — N186 End stage renal disease: Principal | ICD-10-CM

## 2020-05-20 DIAGNOSIS — G629 Polyneuropathy, unspecified: Principal | ICD-10-CM

## 2020-05-20 DIAGNOSIS — K219 Gastro-esophageal reflux disease without esophagitis: Principal | ICD-10-CM

## 2020-05-20 DIAGNOSIS — E559 Vitamin D deficiency, unspecified: Principal | ICD-10-CM

## 2020-05-20 DIAGNOSIS — G40909 Epilepsy, unspecified, not intractable, without status epilepticus: Principal | ICD-10-CM

## 2020-05-20 MED ORDER — OMEPRAZOLE 20 MG CAPSULE,DELAYED RELEASE
ORAL_CAPSULE | Freq: Every day | ORAL | 11 refills | 30.00000 days | Status: CP
Start: 2020-05-20 — End: ?

## 2020-05-20 MED ORDER — ASPIRIN 81 MG CHEWABLE TABLET
ORAL_TABLET | Freq: Every day | ORAL | 11 refills | 30.00000 days | Status: CP
Start: 2020-05-20 — End: 2021-05-20

## 2020-05-20 MED ORDER — CHOLECALCIFEROL (VITAMIN D3) 25 MCG (1,000 UNIT) CAPSULE
ORAL_CAPSULE | Freq: Every day | ORAL | 11 refills | 30.00000 days | Status: CP
Start: 2020-05-20 — End: ?

## 2020-05-20 MED ORDER — SODIUM BICARBONATE 650 MG TABLET
ORAL_TABLET | 11 refills | 0 days | Status: CP
Start: 2020-05-20 — End: ?

## 2020-05-20 MED ORDER — LEVETIRACETAM 500 MG TABLET: 500 mg | tablet | Freq: Two times a day (BID) | 11 refills | 30 days | Status: AC

## 2020-05-20 MED ORDER — CARVEDILOL 12.5 MG TABLET
ORAL_TABLET | Freq: Two times a day (BID) | ORAL | 11 refills | 30.00000 days | Status: CP
Start: 2020-05-20 — End: 2021-05-21

## 2020-05-20 MED ORDER — GLIPIZIDE 5 MG TABLET
ORAL_TABLET | Freq: Every day | ORAL | 11 refills | 30.00000 days | Status: CP
Start: 2020-05-20 — End: 2021-05-20

## 2020-05-20 MED ORDER — MAGNESIUM OXIDE 400 MG (241.3 MG MAGNESIUM) TABLET
ORAL_TABLET | Freq: Every day | ORAL | 11 refills | 30.00000 days | Status: CP
Start: 2020-05-20 — End: ?

## 2020-05-20 MED ORDER — GABAPENTIN 300 MG CAPSULE
ORAL_CAPSULE | ORAL | 11 refills | 0.00000 days | Status: CP
Start: 2020-05-20 — End: ?

## 2020-05-20 MED ORDER — CETIRIZINE 5 MG CHEWABLE TABLET
ORAL_TABLET | Freq: Every day | ORAL | 11 refills | 30.00000 days | Status: CP
Start: 2020-05-20 — End: ?

## 2020-05-20 MED ORDER — TRAMADOL 50 MG TABLET
ORAL_TABLET | Freq: Three times a day (TID) | ORAL | 2 refills | 20 days | Status: CP | PRN
Start: 2020-05-20 — End: ?

## 2020-05-20 MED ORDER — LEVETIRACETAM 500 MG TABLET
ORAL_TABLET | Freq: Two times a day (BID) | ORAL | 0 refills | 30.00000 days | Status: CP
Start: 2020-05-20 — End: ?

## 2020-05-20 NOTE — Unmapped (Signed)
Pt request for RX Refill LEVETIRACETAM 500 MG TABLET

## 2020-05-20 NOTE — Unmapped (Signed)
Pt requesting refills on all medications, during discussion of   Per Dr. Carlene Coria, can send refills in   Confirmed upcoming appt for Dec.       Sent Rx to preferred pharmacy

## 2020-05-23 ENCOUNTER — Encounter: Admit: 2020-05-23 | Discharge: 2020-05-23 | Payer: MEDICARE | Attending: Nephrology | Primary: Nephrology

## 2020-05-23 ENCOUNTER — Other Ambulatory Visit
Admission: RE | Admit: 2020-05-23 | Discharge: 2020-05-23 | Disposition: A | Discharge status: Home / Self Care | Payer: Medicare Other | Attending: Nephrology | Admitting: Nephrology

## 2020-05-23 DIAGNOSIS — Z940 Kidney transplant status: Secondary | ICD-10-CM | POA: Insufficient documentation

## 2020-05-23 DIAGNOSIS — B259 Cytomegaloviral disease, unspecified: Secondary | ICD-10-CM | POA: Insufficient documentation

## 2020-05-23 DIAGNOSIS — Z09 Encounter for follow-up examination after completed treatment for conditions other than malignant neoplasm: Secondary | ICD-10-CM | POA: Insufficient documentation

## 2020-05-23 DIAGNOSIS — E559 Vitamin D deficiency, unspecified: Secondary | ICD-10-CM | POA: Diagnosis not present

## 2020-05-23 DIAGNOSIS — E1129 Type 2 diabetes mellitus with other diabetic kidney complication: Secondary | ICD-10-CM | POA: Insufficient documentation

## 2020-05-23 DIAGNOSIS — D899 Disorder involving the immune mechanism, unspecified: Secondary | ICD-10-CM | POA: Insufficient documentation

## 2020-05-23 DIAGNOSIS — N390 Urinary tract infection, site not specified: Secondary | ICD-10-CM | POA: Insufficient documentation

## 2020-05-23 DIAGNOSIS — Z79899 Other long term (current) drug therapy: Secondary | ICD-10-CM | POA: Diagnosis not present

## 2020-05-23 DIAGNOSIS — Z789 Other specified health status: Secondary | ICD-10-CM | POA: Diagnosis not present

## 2020-05-23 DIAGNOSIS — Z114 Encounter for screening for human immunodeficiency virus [HIV]: Secondary | ICD-10-CM | POA: Diagnosis not present

## 2020-05-23 DIAGNOSIS — Z9483 Pancreas transplant status: Secondary | ICD-10-CM | POA: Insufficient documentation

## 2020-05-23 DIAGNOSIS — D631 Anemia in chronic kidney disease: Secondary | ICD-10-CM | POA: Diagnosis not present

## 2020-05-23 LAB — CBC WITH DIFFERENTIAL/PLATELET
Abs Immature Granulocytes: 0.02 10*3/uL (ref 0.00–0.07)
Basophils Absolute: 0 10*3/uL (ref 0.0–0.1)
Basophils Relative: 0 %
Eosinophils Absolute: 0.1 10*3/uL (ref 0.0–0.5)
Eosinophils Relative: 2 %
HCT: 39.4 % (ref 36.0–46.0)
Hemoglobin: 12.6 g/dL (ref 12.0–15.0)
Immature Granulocytes: 0 %
Lymphocytes Relative: 23 %
Lymphs Abs: 1.5 10*3/uL (ref 0.7–4.0)
MCH: 30.6 pg (ref 26.0–34.0)
MCHC: 32 g/dL (ref 30.0–36.0)
MCV: 95.6 fL (ref 80.0–100.0)
Monocytes Absolute: 0.5 10*3/uL (ref 0.1–1.0)
Monocytes Relative: 8 %
Neutro Abs: 4.4 10*3/uL (ref 1.7–7.7)
Neutrophils Relative %: 67 %
Platelets: 240 10*3/uL (ref 150–400)
RBC: 4.12 MIL/uL (ref 3.87–5.11)
RDW: 13.2 % (ref 11.5–15.5)
WBC: 6.5 10*3/uL (ref 4.0–10.5)
nRBC: 0 % (ref 0.0–0.2)

## 2020-05-23 LAB — BASIC METABOLIC PANEL
Anion gap: 10 (ref 5–15)
BUN: 37 mg/dL — ABNORMAL HIGH (ref 6–20)
CO2: 21 mmol/L — ABNORMAL LOW (ref 22–32)
Calcium: 10 mg/dL (ref 8.9–10.3)
Chloride: 106 mmol/L (ref 98–111)
Creatinine, Ser: 1.97 mg/dL — ABNORMAL HIGH (ref 0.44–1.00)
GFR, Estimated: 30 mL/min — ABNORMAL LOW (ref >60)
Glucose, Bld: 149 mg/dL — ABNORMAL HIGH (ref 70–99)
Potassium: 4.7 mmol/L (ref 3.5–5.1)
Sodium: 137 mmol/L (ref 135–145)

## 2020-05-23 LAB — MAGNESIUM: Magnesium: 1.6 mg/dL — ABNORMAL LOW (ref 1.7–2.4)

## 2020-05-23 LAB — PHOSPHORUS: Phosphorus: 3.6 mg/dL (ref 2.5–4.6)

## 2020-05-27 DIAGNOSIS — Z79899 Other long term (current) drug therapy: Principal | ICD-10-CM

## 2020-05-27 DIAGNOSIS — Z940 Kidney transplant status: Principal | ICD-10-CM

## 2020-05-27 LAB — TACROLIMUS, TROUGH: Lab: 13.6

## 2020-05-28 NOTE — Unmapped (Signed)
Called patient to confirm her recent tacrolimus was not accurate, since labs were drawn at 2:30 pm   She confirmed she took her medications  Cr 1.97 and within baseline    Patient advised to get repeat labs with accurate trough levels before her 9 am dose  Pt. Verbalizes understanding.

## 2020-05-30 NOTE — Unmapped (Signed)
[facility] Clinical Assessment & Refill Coordination Note    Nancy Bowman, DOB: 08/25/1965  Phone: [phone] (home)     All above HIPAA information was verified with patient.     Was a Nurse, learning disability used for this call? 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 aspirin 81 MG chewable tablet Chew 1 tablet (81 mg total) daily. 30 tablet 11   ??? atorvastatin (LIPITOR) 40 MG tablet Take 1 tablet (40 mg total) by mouth daily. 30 tablet 11   ??? blood sugar diagnostic Strp by Other route two (2) times a day. 100 each 11   ??? blood-glucose meter kit Use as instructed 1 each 0   ??? carvediloL (COREG) 12.5 MG tablet Take 1 tablet (12.5 mg total) by mouth Two (2) times a day. 60 tablet 11   ??? cetirizine (ZYRTEC) 5 MG chewable tablet Chew 1 tablet (5 mg total) daily. 30 tablet 11   ??? cholecalciferol, vitamin D3-25 mcg, 1,000 unit,, (VITAMIN D3) 25 mcg (1,000 unit) capsule Take 1 capsule (25 mcg total) by mouth daily. 30 capsule 11   ??? fluticasone (FLONASE) 50 mcg/actuation nasal spray Two sprays each nare twice a day. 16 g 5   ??? gabapentin (NEURONTIN) 300 MG capsule Take 1 capsules (300 mg) by mouth in the morning and at noon, 600 mg in the evening. 360 capsule 11   ??? glipiZIDE (GLUCOTROL) 5 MG tablet Take 1 tablet (5 mg total) by mouth daily. 30 tablet 11   ??? levETIRAcetam (KEPPRA) 500 MG tablet Take 1 tablet (500 mg total) by mouth Two (2) times a day. 60 tablet 0   ??? levETIRAcetam (KEPPRA) 500 MG tablet Take 1 tablet (500 mg total) by mouth Two (2) times a day. 60 tablet 11   ??? magnesium oxide (MAG-OX) 400 mg (241.3 mg elemental magnesium) tablet Take 1 tablet (400 mg total) by mouth daily. 30 tablet 11   ??? miscellaneous medical supply Misc 1 knee brace 1 each 0   ??? mycophenolate (MYFORTIC) 180 MG EC tablet Take 2 tablets (360 mg total) by mouth Three (3) times a day. 180 tablet 11   ??? omeprazole (PRILOSEC) 20 MG capsule Take 1 capsule (20 mg total) by mouth daily. prn 30 capsule 11   ??? sodium bicarbonate 650 mg tablet Take 2 tablets (1300 mg) three times a day. 180 tablet 11   ??? tacrolimus (PROGRAF) 1 MG capsule Take 4 capsules (4 mg total) by mouth two (2) times a day. 240 capsule 11   ??? tamoxifen (NOLVADEX) 20 MG tablet Take 1 tablet (20 mg total) by mouth daily. 90 tablet 4   ??? traMADoL (ULTRAM) 50 mg tablet Take 1 tablet (50 mg total) by mouth every eight (8) hours as needed for pain. 60 tablet 2   ??? valACYclovir (VALTREX) 500 MG tablet Take 1 tablet (500 mg total) by mouth daily. 30 tablet 0     No current facility-administered medications for this visit.        Changes to medications: Neira reports no changes at this time.    Allergies   Allergen Reactions   ??? 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 Ibuprofen      Can't take due to kidneys   ??? Penicillins      Patient  is unsure if she's allergic, was told she had a reaction as a child but thinks she's taking a cillin while sick without having a reaction.  Pt states she has taken amoxicillin in the past and did not have problem       Changes to allergies: No    SPECIALTY MEDICATION ADHERENCE     Tacrolimus 1mg   : 7 days of medicine on hand   Mycophenolate 180mg   : 7 days of medicine on hand       Medication Adherence    Patient reported X missed doses in the last month: 0  Specialty Medication: tacrolimus 1mg   Patient is on additional specialty medications: Yes  Additional Specialty Medications: Mycophenolate 180mg   Patient Reported Additional Medication X Missed Doses in the Last Month: 0  Adherence tools used: patient uses a pill box to manage medications          Specialty medication(s) dose(s) confirmed: Regimen is correct and unchanged.     Are there any concerns with adherence? No    Adherence counseling provided? Not needed    CLINICAL MANAGEMENT AND INTERVENTION      Clinical Benefit Assessment:    Do you feel the medicine is effective or helping your condition? Yes    Clinical Benefit counseling provided? Not needed    Adverse Effects Assessment:    Are you experiencing any side effects? No    Are you experiencing difficulty administering your medicine? No    Quality of Life Assessment:    How many days over the past month did your transplant  keep you from your normal activities? For example, brushing your teeth or getting up in the morning. 0    Have you discussed this with your provider? Not needed    Therapy Appropriateness:    Is therapy appropriate? Yes, therapy is appropriate and should be continued    DISEASE/MEDICATION-SPECIFIC INFORMATION      N/A    PATIENT SPECIFIC NEEDS     - Does the patient have any physical, cognitive, or cultural barriers? No    - Is the patient high risk? Yes, patient is taking a REMS drug. Medication is dispensed in compliance with REMS program    - Does the patient require a Care Management Plan? No     - Does the patient require physician intervention or other additional services (i.e. nutrition, smoking cessation, social work)? No      SHIPPING     Specialty Medication(s) to be Shipped:   Transplant:  mycophenolic acid 180mg  and tacrolimus 1mg     Other medication(s) to be shipped: No additional medications requested for fill at this time     Changes to insurance: No    Delivery Scheduled: Yes, Expected medication delivery date: 06/04/2020.     Medication will be delivered via UPS to the confirmed prescription address in [facility].    The patient will receive a drug information handout for each medication shipped and additional FDA Medication Guides as required.  Verified that patient has previously received a Conservation officer, historic buildings.    All of the patient's questions and concerns have been addressed.    Thad Ranger   [facility] Specialty Pharmacist

## 2020-06-03 MED FILL — TACROLIMUS 1 MG CAPSULE, IMMEDIATE-RELEASE: 30 days supply | Qty: 240 | Fill #4 | Status: AC

## 2020-06-03 MED FILL — MYCOPHENOLATE SODIUM 180 MG TABLET,DELAYED RELEASE: 30 days supply | Qty: 180 | Fill #4 | Status: AC

## 2020-06-03 MED FILL — TACROLIMUS 1 MG CAPSULE, IMMEDIATE-RELEASE: ORAL | 30 days supply | Qty: 240 | Fill #4

## 2020-06-03 MED FILL — MYCOPHENOLATE SODIUM 180 MG TABLET,DELAYED RELEASE: ORAL | 30 days supply | Qty: 180 | Fill #4

## 2020-06-05 NOTE — Unmapped (Signed)
Patient Nancy Bowman was called in attempt number 1 to reach them regarding scheduling their upcoming appointments on next avail.     The call resulted [id number] full    Please schedule the patient upon their return call on next avail for the following:     Return with susan mckenney with mammo prior    Thank you,    Kandis Ban

## 2020-06-06 NOTE — Unmapped (Signed)
Patient Nancy Bowman was called in attempt number 2 to reach them regarding scheduling their upcoming appointments on next avail.   ??  The call resulted [id number] full  ??  Please schedule the patient upon their return call on next avail for the following:   ??  Return with susan mckenney with mammo prior  ??  Thank you,  ??  Christina L Good

## 2020-06-07 NOTE — Unmapped (Signed)
Patient??Vinette Deonzella Beamer??was called in attempt number 3??to reach them regarding scheduling??their upcoming appointments on next avail.   ??  The call resulted [id number] full  ??  Please??schedule??the patient upon their return call on next avail??for the following:   ??  Return with susan mckenney with mammo prior  ??  Thank you,  ??  Christina L Good

## 2020-06-19 ENCOUNTER — Other Ambulatory Visit: Payer: Self-pay | Admitting: Family Medicine

## 2020-06-19 MED ORDER — TAMOXIFEN 20 MG TABLET
ORAL_TABLET | Freq: Every day | ORAL | 0 refills | 0 days
Start: 2020-06-19 — End: ?

## 2020-06-19 NOTE — Telephone Encounter (Signed)
Requested medication (s) are due for refill today: yes  Requested medication (s) are on the active medication list: yes  Last refill:  04/19/2020  Future visit scheduled: no  Notes to clinic:  Patient states that once she gets her car back from out the shop she will callback and schedule appt Review for another fill   Requested Prescriptions  Pending Prescriptions Disp Refills   fluticasone (FLONASE) 50 MCG/ACT nasal spray [Pharmacy Med Name: FLUTICASONE PROPIONATE NS] 16 g 0    Sig: Two sprays each NOSTRIL twice a day.      Ear, Nose, and Throat: Nasal Preparations - Corticosteroids Failed - 06/19/2020 12:15 PM      Failed - Valid encounter within last 12 months    Recent Outpatient Visits           1 year ago Upper respiratory tract infection, unspecified type   [facility] American, [place]   1 year ago Upper respiratory tract infection, unspecified type   [facility], Lilia Argue, [place]   1 year ago Pain, dental   [place], [place], DO   1 year ago Diverticulitis   [place], Megan P, DO   1 year ago Atypical chest pain   [place], Elbe, DO

## 2020-06-20 DIAGNOSIS — Z940 Kidney transplant status: Principal | ICD-10-CM

## 2020-06-20 MED ORDER — TAMOXIFEN 20 MG TABLET
ORAL_TABLET | Freq: Every day | ORAL | 0 refills | 90 days | Status: CP
Start: 2020-06-20 — End: ?

## 2020-06-20 MED ORDER — FLUTICASONE PROPIONATE 50 MCG/ACTUATION NASAL SPRAY,SUSPENSION
5 refills | 0 days | Status: CP
Start: 2020-06-20 — End: ?

## 2020-06-20 NOTE — Unmapped (Signed)
Please refill in appropriate

## 2020-06-26 NOTE — Unmapped (Signed)
[facility] Refill Coordination Note    Specialty Medication(s) to be Shipped:   Transplant:  mycophenolic acid 180mg  and tacrolimus 1mg     Other medication(s) to be shipped: No additional medications requested for fill at this time     Nancy Bowman, DOB: 1965/08/12  Phone: [phone] (home)       All above HIPAA information was verified with patient.     Was a Nurse, learning disability used for this call? No    Completed refill call assessment today to schedule patient's medication shipment from the [facility] [phone]).       Specialty medication(s) and dose(s) confirmed: Regimen is correct and unchanged.   Changes to medications: Nancy Bowman reports no changes at this time.  Changes to insurance: No  Questions for the pharmacist: No    Confirmed patient received Welcome Packet with first shipment. The patient will receive a drug information handout for each medication shipped and additional FDA Medication Guides as required.       DISEASE/MEDICATION-SPECIFIC INFORMATION        N/A    SPECIALTY MEDICATION ADHERENCE     Medication Adherence    Patient reported X missed doses in the last month: 0  Specialty Medication: Mycophenolate 180mg   Patient is on additional specialty medications: Yes  Additional Specialty Medications: Tacrolimus 1mg   Patient Reported Additional Medication X Missed Doses in the Last Month: 0  Patient is on more than two specialty medications: No  Adherence tools used: patient uses a pill box to manage medications                Mycophenolate 180 mg: 7 days of medicine on hand   Tacrolimus 1 mg: 7 days of medicine on hand *    SHIPPING     Shipping address confirmed in Epic.     Delivery Scheduled: Yes, Expected medication delivery date: 07/03/20.     Medication will be delivered via UPS to the prescription address in Epic [facility].    [name]   [facility] Specialty Pharmacist

## 2020-06-28 DIAGNOSIS — B009 Herpesviral infection, unspecified: Principal | ICD-10-CM

## 2020-06-28 MED ORDER — VALACYCLOVIR 500 MG TABLET
ORAL_TABLET | Freq: Every day | ORAL | 0 refills | 0.00000 days
Start: 2020-06-28 — End: ?

## 2020-07-02 MED FILL — TACROLIMUS 1 MG CAPSULE, IMMEDIATE-RELEASE: ORAL | 30 days supply | Qty: 240 | Fill #5

## 2020-07-02 MED FILL — MYCOPHENOLATE SODIUM 180 MG TABLET,DELAYED RELEASE: 30 days supply | Qty: 180 | Fill #5 | Status: AC

## 2020-07-02 MED FILL — TACROLIMUS 1 MG CAPSULE, IMMEDIATE-RELEASE: 30 days supply | Qty: 240 | Fill #5 | Status: AC

## 2020-07-02 MED FILL — MYCOPHENOLATE SODIUM 180 MG TABLET,DELAYED RELEASE: ORAL | 30 days supply | Qty: 180 | Fill #5

## 2020-07-11 ENCOUNTER — Non-Acute Institutional Stay: Admit: 2020-07-11 | Discharge: 2020-07-11 | Payer: MEDICARE

## 2020-07-11 ENCOUNTER — Encounter: Admit: 2020-07-11 | Discharge: 2020-07-11 | Payer: MEDICARE

## 2020-07-11 ENCOUNTER — Ambulatory Visit: Admit: 2020-07-11 | Discharge: 2020-07-11 | Payer: MEDICARE

## 2020-07-11 ENCOUNTER — Ambulatory Visit: Admit: 2020-07-11 | Discharge: 2020-07-11 | Payer: MEDICARE | Attending: Nephrology | Primary: Nephrology

## 2020-07-11 DIAGNOSIS — Z23 Encounter for immunization: Principal | ICD-10-CM

## 2020-07-11 DIAGNOSIS — Z79810 Long term (current) use of selective estrogen receptor modulators (SERMs): Principal | ICD-10-CM

## 2020-07-11 DIAGNOSIS — E872 Acidosis: Principal | ICD-10-CM

## 2020-07-11 DIAGNOSIS — C50919 Malignant neoplasm of unspecified site of unspecified female breast: Principal | ICD-10-CM

## 2020-07-11 DIAGNOSIS — G8929 Other chronic pain: Principal | ICD-10-CM

## 2020-07-11 DIAGNOSIS — B009 Herpesviral infection, unspecified: Principal | ICD-10-CM

## 2020-07-11 DIAGNOSIS — E119 Type 2 diabetes mellitus without complications: Principal | ICD-10-CM

## 2020-07-11 DIAGNOSIS — Z7984 Long term (current) use of oral hypoglycemic drugs: Principal | ICD-10-CM

## 2020-07-11 DIAGNOSIS — M545 Low back pain, unspecified: Principal | ICD-10-CM

## 2020-07-11 DIAGNOSIS — N2581 Secondary hyperparathyroidism of renal origin: Principal | ICD-10-CM

## 2020-07-11 DIAGNOSIS — R0989 Other specified symptoms and signs involving the circulatory and respiratory systems: Principal | ICD-10-CM

## 2020-07-11 DIAGNOSIS — J9811 Atelectasis: Principal | ICD-10-CM

## 2020-07-11 DIAGNOSIS — Z940 Kidney transplant status: Principal | ICD-10-CM

## 2020-07-11 DIAGNOSIS — M79606 Pain in leg, unspecified: Principal | ICD-10-CM

## 2020-07-11 DIAGNOSIS — Z09 Encounter for follow-up examination after completed treatment for conditions other than malignant neoplasm: Principal | ICD-10-CM

## 2020-07-11 DIAGNOSIS — Z79899 Other long term (current) drug therapy: Principal | ICD-10-CM

## 2020-07-11 DIAGNOSIS — I10 Essential (primary) hypertension: Principal | ICD-10-CM

## 2020-07-11 DIAGNOSIS — G40909 Epilepsy, unspecified, not intractable, without status epilepticus: Principal | ICD-10-CM

## 2020-07-11 DIAGNOSIS — Z7982 Long term (current) use of aspirin: Principal | ICD-10-CM

## 2020-07-11 DIAGNOSIS — D849 Immunodeficiency, unspecified: Principal | ICD-10-CM

## 2020-07-11 DIAGNOSIS — N1330 Unspecified hydronephrosis: Secondary | ICD-10-CM | POA: Diagnosis not present

## 2020-07-11 LAB — URINALYSIS
BILIRUBIN UA: NEGATIVE
GLUCOSE UA: NEGATIVE
KETONES UA: NEGATIVE
LEUKOCYTE ESTERASE UA: NEGATIVE
NITRITE UA: NEGATIVE
PH UA: 6 (ref 5.0–9.0)
PROTEIN UA: 30 — AB
RBC UA: 4 /HPF — ABNORMAL HIGH (ref <4)
SPECIFIC GRAVITY UA: 1.02 (ref 1.005–1.030)
SQUAMOUS EPITHELIAL: <1 /HPF (ref 0–5)
UROBILINOGEN UA: 0.2
WBC UA: 5 /HPF (ref 0–5)

## 2020-07-11 LAB — COMPREHENSIVE METABOLIC PANEL
ALBUMIN: 4 g/dL (ref 3.4–5.0)
ALKALINE PHOSPHATASE: 54 U/L (ref 46–116)
ALT (SGPT): 12 U/L (ref 10–49)
ANION GAP: 4 mmol/L — ABNORMAL LOW (ref 5–14)
AST (SGOT): 11 U/L (ref <=34)
BILIRUBIN TOTAL: 0.4 mg/dL (ref 0.3–1.2)
BLOOD UREA NITROGEN: 30 mg/dL — ABNORMAL HIGH (ref 9–23)
BUN / CREAT RATIO: 17
CALCIUM: 10.8 mg/dL — ABNORMAL HIGH (ref 8.7–10.4)
CHLORIDE: 106 mmol/L (ref 98–107)
CO2: 26 mmol/L (ref 20.0–31.0)
CREATININE: 1.73 mg/dL — ABNORMAL HIGH
EGFR CKD-EPI AA FEMALE: 38 mL/min/{1.73_m2} — ABNORMAL LOW (ref >=60)
EGFR CKD-EPI NON-AA FEMALE: 33 mL/min/{1.73_m2} — ABNORMAL LOW (ref >=60)
GLUCOSE RANDOM: 166 mg/dL (ref 70–179)
POTASSIUM: 4.5 mmol/L (ref 3.4–4.5)
PROTEIN TOTAL: 7.4 g/dL (ref 5.7–8.2)
SODIUM: 136 mmol/L (ref 135–145)

## 2020-07-11 LAB — CBC W/ AUTO DIFF
BASOPHILS ABSOLUTE COUNT: 0.1 10*9/L (ref 0.0–0.1)
BASOPHILS RELATIVE PERCENT: 0.8 %
EOSINOPHILS ABSOLUTE COUNT: 0.1 10*9/L (ref 0.0–0.7)
EOSINOPHILS RELATIVE PERCENT: 2.1 %
HEMATOCRIT: 38.7 % (ref 35.0–44.0)
HEMOGLOBIN: 12.6 g/dL (ref 12.0–15.5)
LYMPHOCYTES ABSOLUTE COUNT: 1.5 10*9/L (ref 0.7–4.0)
LYMPHOCYTES RELATIVE PERCENT: 21.3 %
MEAN CORPUSCULAR HEMOGLOBIN CONC: 32.4 g/dL (ref 30.0–36.0)
MEAN CORPUSCULAR HEMOGLOBIN: 30.5 pg (ref 26.0–34.0)
MEAN CORPUSCULAR VOLUME: 94.1 fL (ref 82.0–98.0)
MEAN PLATELET VOLUME: 8.5 fL (ref 7.0–10.0)
MONOCYTES ABSOLUTE COUNT: 0.6 10*9/L (ref 0.1–1.0)
MONOCYTES RELATIVE PERCENT: 7.8 %
NEUTROPHILS ABSOLUTE COUNT: 4.9 10*9/L (ref 1.7–7.7)
NEUTROPHILS RELATIVE PERCENT: 68 %
PLATELET COUNT: 238 10*9/L (ref 150–450)
RED BLOOD CELL COUNT: 4.11 10*12/L (ref 3.90–5.03)
RED CELL DISTRIBUTION WIDTH: 13.2 % (ref 12.0–15.0)
WBC ADJUSTED: 7.2 10*9/L (ref 3.5–10.5)

## 2020-07-11 LAB — MAGNESIUM: MAGNESIUM: 1.8 mg/dL (ref 1.6–2.6)

## 2020-07-11 LAB — PROTEIN / CREATININE RATIO, URINE
CREATININE, URINE: 138.6 mg/dL
PROTEIN URINE: 39.1 mg/dL
PROTEIN/CREAT RATIO, URINE: 0.282

## 2020-07-11 LAB — CMV DNA, QUANTITATIVE, PCR: CMV VIRAL LD: NOT DETECTED

## 2020-07-11 LAB — PHOSPHORUS: PHOSPHORUS: 3.5 mg/dL (ref 2.4–5.1)

## 2020-07-11 LAB — TACROLIMUS LEVEL, TROUGH: TACROLIMUS, TROUGH: 6.5 ng/mL (ref 5.0–15.0)

## 2020-07-11 MED ORDER — VALACYCLOVIR 500 MG TABLET
ORAL_TABLET | Freq: Every day | ORAL | 6 refills | 30 days | Status: CP
Start: 2020-07-11 — End: ?

## 2020-07-11 MED ORDER — TRAMADOL 50 MG TABLET
ORAL_TABLET | Freq: Three times a day (TID) | ORAL | 2 refills | 20 days | Status: CP | PRN
Start: 2020-07-11 — End: ?

## 2020-07-11 MED ORDER — BLOOD-GLUCOSE METER KIT WRAPPER
0 refills | 0.00000 days | Status: CP
Start: 2020-07-11 — End: 2021-07-11

## 2020-07-11 NOTE — Unmapped (Signed)
Pt received third Covid-19 dose (Moderna). Observed pt for 15 mins, pt displayed no signs of allergic reactions and no c/o of discomfort. Pt is aware if any changes occur to seek medical attention.

## 2020-07-11 NOTE — Unmapped (Signed)
AOBP performed left  arm, large cuff.     1st reading - 135/88, p 68   2nd reading - 124/83, p 66    3rd reading- 125/85, p 69     Average - 128/85, p 68

## 2020-07-11 NOTE — Unmapped (Signed)
Contact Dr. Prescilla Sours to schedule your mammogram.   [address] [place], [place] [address]   [phone]

## 2020-07-11 NOTE — Unmapped (Signed)
Transplant Nephrology Clinic Visit      Primary Care Physician: Olevia Perches, DO    History of Present Illness    Patient is a 54 y.o. 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  She was on dialysis approximately 7 years prior to this transplant, the donor kidney had a KDPI of 65%.  She did have issues with hyperkalemia while on dialysis, and was on Kayexalate weekly.  Her post transplant course has been without rejection or recurrent disease. Patient does have evidence of a donor specific antibody to DP01 at an MFI of 2789 (stable) on 08/31/18. Her most recent creatinine has ranged between 1.6 and 2.4.    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     In December 2016, she underwent routine mammogram that was abnormal. Follow-up mammogram in January was again highly suggestive of significant lesion in the left breast. She underwent core biopsy on 09/09/2015 which showed ductal carcinoma in situ. She subsequently underwent a partial mastectomy on 09/24/2015. 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 That final pathology showed a positive medial margin, so she underwent reexcision on 10/25/2015 for which the pathology was negative. Her surgical management was felt to be complete.     The patient was hospitalized in [place] in the beginning of January for 2-3 days with sinus congestion, shortness of breath on exertion, and scant hemoptysis. She was diagnosed with URI and treated with Levaquin. Her symptoms have improved.    Patient was admitted from 1/6 through 08/09/18 for chest pain. Serial troponins negative, echo unremarkable with EF 65-70%.     She was again admitted 1/19 through 08/24/18, after presenting with several days of left lower abdominal pain. CT notable for diverticulitis with leukocytosis on labs. She was treated with IV ciprofloxacin and Flagyl during admission. Discharged home on 8 day course of PO cipro and flagyl. Creatinine remained stable during admission.     Interval history since phone visit on 10/06/2019:    She was seen in the ED at [facility] on 12/05/19 complaining of headache, sinus congestion and cough. Nasal spray, antihistamine and Mucinex not helping. She was admitted from 5/4 through 12/06/19 and given Rocephin for possible bacterial sinusitis and continued cefdinir as an outpatient for 5 days. Creatinine 2.76 on presentation, 1.87 on discharge. Blood cultures negative.     She called the on-call coordinator 12/18/19 asking for med refills that apparently her PCP was unwilling to refill.     She presents today for follow up.    She has been getting some labs at [facility], though they do not populate into our system. On 8/23 and last on 05/23/20 with creatinine of 1.97.    She reports that she had missed some appointments at her PCP which is why they would not refill her meds. She is getting back on track with them and has an appointment before Christmas. She also needs to reschedule her mammogram. Would like to get a colonoscopy through [facility], at [facility] if possible.     She specifically denies fever, myalgias, upper respiratory/GI symptoms, or change in taste/smell.    Last dose of Prograf: 9 pm     Review of Systems    Otherwise on review of systems patient denies fever or chills, chest pain, SOB, PND or orthopnea, lower extremity edema.  Denies abdominal pain.  No dysuria,  hematuria or difficulty voiding. Bowel movements normal.  Denies joint pain or rash.  All other systems are reviewed and are negative.    Medications    Current Outpatient Medications   Medication Sig Dispense Refill   ??? aspirin 81 MG chewable tablet Chew 1 tablet (81 mg total) daily. 30 tablet 11   ??? atorvastatin (LIPITOR) 40 MG tablet Take 1 tablet (40 mg total) by mouth daily. 30 tablet 11   ??? blood sugar diagnostic Strp by Other route two (2) times a day. 100 each 11   ??? blood-glucose meter kit Use as instructed 1 each 0   ??? carvediloL (COREG) 12.5 MG tablet Take 1 tablet (12.5 mg total) by mouth Two (2) times a day. 60 tablet 11   ??? cetirizine (ZYRTEC) 5 MG chewable tablet Chew 1 tablet (5 mg total) daily. 30 tablet 11   ??? cholecalciferol, vitamin D3-25 mcg, 1,000 unit,, (VITAMIN D3) 25 mcg (1,000 unit) capsule Take 1 capsule (25 mcg total) by mouth daily. 30 capsule 11   ??? fluticasone propionate (FLONASE) 50 mcg/actuation nasal spray Two sprays each nare twice a day. 16 g 5   ??? gabapentin (NEURONTIN) 300 MG capsule Take 1 capsules (300 mg) by mouth in the morning and at noon, 600 mg in the evening. 360 capsule 11   ??? glipiZIDE (GLUCOTROL) 5 MG tablet Take 1 tablet (5 mg total) by mouth daily. 30 tablet 11   ??? levETIRAcetam (KEPPRA) 500 MG tablet Take 1 tablet (500 mg total) by mouth Two (2) times a day. 60 tablet 11   ??? magnesium oxide (MAG-OX) 400 mg (241.3 mg elemental magnesium) tablet Take 1 tablet (400 mg total) by mouth daily. 30 tablet 11   ??? miscellaneous medical supply Misc 1 knee brace 1 each 0   ??? mycophenolate (MYFORTIC) 180 MG EC tablet Take 2 tablets (360 mg total) by mouth Three (3) times a day. 180 tablet 11   ??? omeprazole (PRILOSEC) 20 MG capsule Take 1 capsule (20 mg total) by mouth daily. prn 30 capsule 11   ??? sodium bicarbonate 650 mg tablet Take 2 tablets (1300 mg) three times a day. 180 tablet 11   ??? tacrolimus (PROGRAF) 1 MG capsule Take 4 capsules (4 mg total) by mouth two (2) times a day. 240 capsule 11   ??? tamoxifen (NOLVADEX) 20 MG tablet Take 1 tablet (20 mg total) by mouth daily. 90 tablet 0   ??? traMADoL (ULTRAM) 50 mg tablet Take 1 tablet (50 mg total) by mouth every eight (8) hours as needed for pain. 60 tablet 2   ??? valACYclovir (VALTREX) 500 MG tablet Take 1 tablet (500 mg total) by mouth daily. 30 tablet 6   ??? blood-glucose meter kit Use as instructed 1 each 0   ??? levETIRAcetam (KEPPRA) 500 MG tablet Take 1 tablet (500 mg total) by mouth Two (2) times a day. 60 tablet 0     No current facility-administered medications for this visit.       Physical Exam    BP 128/85 (BP Site: L Arm, BP Position: Sitting, BP Cuff Size: Large)  - Pulse 68  - Temp 35.6 ??C (96.1 ??F) (Temporal)  - Ht 160 cm (5' 2.99)  - Wt 87.1 kg (192 lb)  - LMP 09/04/2015  - BMI 34.02 kg/m??   General: Patient is a pleasant female in no apparent distress.  Eyes: Sclera anicteric.  ENT: Mask in place.  Neck: Supple without LAD/JVD/bruits.  Lungs: Clear  to auscultation bilaterally, no wheezes/rales/rhonchi.  Cardiovascular: Regular rate and rhythm without murmurs, rubs or gallops.  Abdomen: Soft, notender/nondistended. Positive bowel sounds. No tenderness over the graft.  Extremities: Without edema, joints without evidence of synovitis.  Skin: Without rash.  Neurological: Grossly nonfocal.  Psychiatric: Mood and affect appropriate.      Laboratory Results    Results for orders placed or performed in visit on 07/11/20   Urine Culture    Specimen: Clean Catch; Urine   Result Value Ref Range    Urine Culture, Comprehensive Mixed Urogenital Flora    Phosphorus Level   Result Value Ref Range    Phosphorus 3.5 2.4 - 5.1 mg/dL   Magnesium Level   Result Value Ref Range    Magnesium 1.8 1.6 - 2.6 mg/dL   Urinalysis   Result Value Ref Range    Color, UA Yellow     Clarity, UA Clear     Specific Gravity, UA 1.020 1.005 - 1.030    pH, UA 6.0 5.0 - 9.0    Leukocyte Esterase, UA Negative Negative    Nitrite, UA Negative Negative    Protein, UA 30 mg/dL (A) Negative    Glucose, UA Negative Negative    Ketones, UA Negative Negative    Urobilinogen, UA 0.2 mg/dL 0.2 - 2.0 mg/dL    Bilirubin, UA Negative Negative    Blood, UA Small (A) Negative    RBC, UA 4 (H) <4 /HPF    WBC, UA 5 0 - 5 /HPF    Squam Epithel, UA <1 0 - 5 /HPF    Bacteria, UA Occasional (A) None Seen /HPF   Protein/Creatinine Ratio, Urine   Result Value Ref Range    Creat U 138.6 Undefined mg/dL    Protein, Ur 16.1 mg/dL    Protein/Creatinine Ratio, Urine 0.282 Undefined   Comprehensive Metabolic Panel   Result Value Ref Range    Sodium 136 135 - 145 mmol/L    Potassium 4.5 3.4 - 4.5 mmol/L    Chloride 106 98 - 107 mmol/L    Anion Gap 4 (L) 5 - 14 mmol/L    CO2 26.0 20.0 - 31.0 mmol/L    BUN 30 (H) 9 - 23 mg/dL    Creatinine 0.96 (H) 0.60 - 0.80 mg/dL    BUN/Creatinine Ratio 17     EGFR CKD-EPI Non-African American, Female 33 (L) >=60 mL/min/1.62m2    EGFR CKD-EPI African American, Female 38 (L) >=60 mL/min/1.43m2    Glucose 166 70 - 179 mg/dL    Calcium 04.5 (H) 8.7 - 10.4 mg/dL    Albumin 4.0 3.4 - 5.0 g/dL    Total Protein 7.4 5.7 - 8.2 g/dL    Total Bilirubin 0.4 0.3 - 1.2 mg/dL    AST 11 <=40 U/L    ALT 12 10 - 49 U/L    Alkaline Phosphatase 54 46 - 116 U/L   Tacrolimus Level, Trough   Result Value Ref Range    Tacrolimus, Trough 6.5 5.0 - 15.0 ng/mL   CMV DNA, quantitative, PCR   Result Value Ref Range    CMV Viral Ld Not Detected Not Detected    CMV Quant      CMV Quant Log10      CMV Comment     BK Virus, DNA, Quantitative, Serum   Result Value Ref Range    BK Blood Result Not Detected Not Detected    BK Blood Quant      BK Blood  Log(10)      Bk Blood Comment     CBC w/ Differential   Result Value Ref Range    WBC 7.2 3.5 - 10.5 10*9/L    RBC 4.11 3.90 - 5.03 10*12/L    HGB 12.6 12.0 - 15.5 g/dL    HCT 16.1 09.6 - 04.5 %    MCV 94.1 82.0 - 98.0 fL    MCH 30.5 26.0 - 34.0 pg    MCHC 32.4 30.0 - 36.0 g/dL    RDW 40.9 81.1 - 91.4 %    MPV 8.5 7.0 - 10.0 fL    Platelet 238 150 - 450 10*9/L    Neutrophils % 68.0 %    Lymphocytes % 21.3 %    Monocytes % 7.8 %    Eosinophils % 2.1 %    Basophils % 0.8 %    Absolute Neutrophils 4.9 1.7 - 7.7 10*9/L    Absolute Lymphocytes 1.5 0.7 - 4.0 10*9/L    Absolute Monocytes 0.6 0.1 - 1.0 10*9/L    Absolute Eosinophils 0.1 0.0 - 0.7 10*9/L    Absolute Basophils 0.1 0.0 - 0.1 10*9/L     *Note: Due to a large number of results and/or encounters for the requested time period, some results have not been displayed. A complete set of results can be found in Results Review.       Assessment and Plan    1. Status post renal transplant. Her creatinine today of 1.73 is within her baseline. Her prograf trough of 6.5 is a 12 hour trough and at goal of 6-8. Will continue current immunosuppression.    2. Breast cancer status post resection. On tamoxifen and tolerating that well. Had been scheduled for regular oncology follow up and mammography screening in June, but was postponed due to covid. She has not yet rescheduled, was again encouraged to do so.    3. Secondary hyperparathyroidism. Calcium has been mildly elevated, 10.8 today. If remains high may need to start Sensipar.    4. History of hyperkalemia. Potassium normal at 4.5. She has been educated on low potassium diet. Would not treat unless > 6.    5. Metabolic acidosis. HCO3 normal at 26. Continue current supplementation, if remains in normal range may be able to reduce.     6. History of seizure disorder. Patient denies seizure activity and continues on Keppra.     7. Hypertension. Blood pressure controlled on current regimen.     8. Chronic lower back and leg pain. Stable.     9. Diabetes mellitus. Hgb A1c 6.8 in January 2020. Managed elsewhere. Reports good glucose control.    10. Elevated LDL of 103 in 08/2017. Continues to trend down, 27 on last labs though was on 08/31/18. On atorvastatin. Will continue to follow closely, and try to get lipid panel at next visit.     11. Health maintenance. She received a flu shot this past fall. Still needs Shingrix and pneumococcal vaccines. She has completed the COVID vaccine and had a booster today. Got her flu shot today 07/11/20.    We discussed that while we continue to recommend the COVID vaccine for all eligible patients, we know that kidney transplant patients do not respond as strongly due to their immunosuppression, so she should continue to follow the CDC guidelines for patients who are not vaccinated. I would also encourage all household members and close contacts that are eligible for vaccination to get vaccinated. We discussed the importance of ongoing  social distancing, wearing a mask outside the home, and hand washing/sanitizing.     12. Will see patient back in 4 months, or sooner if needed.

## 2020-07-12 LAB — BK VIRUS QUANTITATIVE PCR, BLOOD: BK BLOOD RESULT: NOT DETECTED

## 2020-07-23 NOTE — Unmapped (Signed)
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     Was a Nurse, learning disability used for this call? No    Completed refill call assessment today to schedule patient's medication shipment from the [facility] [phone]).       Specialty medication(s) and dose(s) confirmed: Regimen is correct and unchanged.   Changes to medications: Decklyn reports no changes at this time.  Changes to insurance: No  Questions for the pharmacist: No    Confirmed patient received Welcome Packet with first shipment. The patient will receive a drug information handout for each medication shipped and additional FDA Medication Guides as required.       DISEASE/MEDICATION-SPECIFIC INFORMATION        N/A    SPECIALTY MEDICATION ADHERENCE     Medication Adherence    Patient reported X missed doses in the last month: 0  Specialty Medication: Mycophenolate 180mg   Patient is on additional specialty medications: Yes  Additional Specialty Medications: Tacrolimus 1mg   Patient Reported Additional Medication X Missed Doses in the Last Month: 0  Patient is on more than two specialty medications: No  Adherence tools used: patient uses a pill box to manage medications        Mycophenolate 180 mg: 12 days of medicine on hand   Tacrolimus 1 mg: 12 days of medicine on hand     SHIPPING     Shipping address confirmed in Epic.     Delivery Scheduled: Yes, Expected medication delivery date: 08/01/2020.     Medication will be delivered via UPS to the prescription address in Epic WAM.    Lorelei Pont [facility] Specialty Technician

## 2020-07-24 ENCOUNTER — Telehealth: Payer: Self-pay

## 2020-07-24 NOTE — Telephone Encounter (Signed)
Noted. Thanks.

## 2020-07-24 NOTE — Telephone Encounter (Signed)
Copied from [place] [id number]. Topic: General - Inquiry >> Jul 24, 2020  1:50 PM Gillis Ends D wrote: Reason for CRM: Patient called to make an appointment but refused the appointment I offered her because she said she wasn't sure if she could come, but she wanted me to let the Dr. Gwyndolyn Kaufman that she called. She said she had received several calls from the office to schedule an appointment but she refused the one I offered her on 08-20-2020 at 1:00. Please advise   FYI Did not see any documentation of [facility] trying to communicate with Pt but will make PCP aware.

## 2020-07-24 NOTE — Telephone Encounter (Signed)
FYI

## 2020-07-31 MED FILL — MYCOPHENOLATE SODIUM 180 MG TABLET,DELAYED RELEASE: ORAL | 30 days supply | Qty: 180 | Fill #6

## 2020-07-31 MED FILL — MYCOPHENOLATE SODIUM 180 MG TABLET,DELAYED RELEASE: 30 days supply | Qty: 180 | Fill #6 | Status: AC

## 2020-07-31 MED FILL — TACROLIMUS 1 MG CAPSULE, IMMEDIATE-RELEASE: 30 days supply | Qty: 240 | Fill #6 | Status: AC

## 2020-07-31 MED FILL — TACROLIMUS 1 MG CAPSULE, IMMEDIATE-RELEASE: ORAL | 30 days supply | Qty: 240 | Fill #6

## 2020-09-03 NOTE — Unmapped (Signed)
The [facility] has made a third and final attempt to reach this patient to refill the following medication:tacrolimus, mycophenolate.      We have been unable to leave messages on the following phone numbers: [phone]  and have sent a MyChart message.    Dates contacted: 1/21, 1/27, 2/1  Last scheduled delivery: 12/30    The patient may be at risk of non-compliance with this medication. The patient should call the [facility] at [phone] (option 4) to refill medication.    Nancy Bowman   [facility] Specialty Pharmacist

## 2020-09-12 MED ORDER — BLOOD SUGAR DIAGNOSTIC STRIPS
Freq: Four times a day (QID) | 7 refills | 0 days | Status: CP
Start: 2020-09-12 — End: 2021-09-12

## 2020-09-12 NOTE — Unmapped (Signed)
Pt paged on call coordinator.    Returned page at 1511, unable to reach, left VM with call back instructions.     Returned page at 1519, pt request increase in test strips for blood glucose monitoring, uses 3-4 times per day. Prescription updated and sent to local pharmacy.     Will forward message to primary TNC.

## 2020-10-02 DIAGNOSIS — D0512 Intraductal carcinoma in situ of left breast: Principal | ICD-10-CM

## 2020-10-02 NOTE — Unmapped (Signed)
Per chart, pt's last mammogram & clinic visit was on 01/14/2018.  New order placed for mammogram & msg fowrarded to return scheduler to get pt scheduled for her overdue visits.

## 2020-10-02 NOTE — Unmapped (Signed)
[facility] Refill Coordination Note    Specialty Medication(s) to be Shipped:   Transplant:  mycophenolic acid 180mg  and tacrolimus 1mg     Other medication(s) to be shipped: No additional medications requested for fill at this time     Nancy Bowman, DOB: August 30, 1965  Phone: [phone] (home)       All above HIPAA information was verified with patient.     Was a Nurse, learning disability used for this call? No    Completed refill call assessment today to schedule patient's medication shipment from the [facility] [phone]).       Specialty medication(s) and dose(s) confirmed: Regimen is correct and unchanged.   Changes to medications: Sophiana reports no changes at this time.  Changes to insurance: No  Questions for the pharmacist: No    Confirmed patient received Welcome Packet with first shipment. The patient will receive a drug information handout for each medication shipped and additional FDA Medication Guides as required.       DISEASE/MEDICATION-SPECIFIC INFORMATION        N/A    SPECIALTY MEDICATION ADHERENCE     Medication Adherence    Patient reported X missed doses in the last month: 0  Specialty Medication: tacrolimus (PROGRAF) 1 MG capsule  Patient is on additional specialty medications: Yes  Additional Specialty Medications: mycophenolate (MYFORTIC) 180 MG EC tablet  Patient Reported Additional Medication X Missed Doses in the Last Month: 0  Patient is on more than two specialty medications: No  Adherence tools used: patient uses a pill box to manage medications             mycophenolic acid 180mg  2 days worth of medication on hand.  tacrolimus 1mg  2 days worth of medication on hand.            SHIPPING     Shipping address confirmed in Epic.     Delivery Scheduled: Yes, Expected medication delivery date: 10/03/20.     Medication will be delivered via Same Day Courier to the prescription address in Epic WAM.    [place] A Merinda Victorino   [facility] Specialty Technician

## 2020-10-02 NOTE — Unmapped (Signed)
Hi,     Dwanda contacted the Communication Center regarding the following:    - Orders needed for mammo so pt can schedule appt    Please contact Pati at [phone].    Thanks in advance,    Keturah Shavers  [facility]   [phone]

## 2020-10-03 MED FILL — TACROLIMUS 1 MG CAPSULE, IMMEDIATE-RELEASE: ORAL | 30 days supply | Qty: 240 | Fill #7

## 2020-10-03 MED FILL — MYCOPHENOLATE SODIUM 180 MG TABLET,DELAYED RELEASE: ORAL | 30 days supply | Qty: 180 | Fill #7

## 2020-10-09 NOTE — Unmapped (Signed)
Hi [name] has received the request and is working on it. She will update the patient once its been scheduled.    Thank you for letting me know.     Nancy Bowman

## 2020-10-09 NOTE — Unmapped (Signed)
Returned patients call regarding scheduling an appointment. Voicemail left.

## 2020-10-10 NOTE — Unmapped (Signed)
She is all scheduled.    Thanks  [employer]

## 2020-10-28 MED FILL — TACROLIMUS 1 MG CAPSULE, IMMEDIATE-RELEASE: ORAL | 30 days supply | Qty: 240 | Fill #8

## 2020-10-28 MED FILL — MYCOPHENOLATE SODIUM 180 MG TABLET,DELAYED RELEASE: ORAL | 30 days supply | Qty: 180 | Fill #8

## 2020-10-28 NOTE — Unmapped (Signed)
[facility] Clinical Assessment & Refill Coordination Note    Nancy Bowman, DOB: [birth year]  Phone: [phone] (home)     All above HIPAA information was verified with patient.     Was a Nurse, learning disability used for this call? 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 aspirin 81 MG chewable tablet Chew 1 tablet (81 mg total) daily. 30 tablet 11   ??? atorvastatin (LIPITOR) 40 MG tablet Take 1 tablet (40 mg total) by mouth daily. 30 tablet 11   ??? blood sugar diagnostic Strp by Other route Four (4) times a day (before meals and nightly). 200 each 7   ??? blood-glucose meter kit Use as instructed 1 each 0   ??? blood-glucose meter kit Use as instructed 1 each 0   ??? carvediloL (COREG) 12.5 MG tablet Take 1 tablet (12.5 mg total) by mouth Two (2) times a day. 60 tablet 11   ??? cetirizine (ZYRTEC) 5 MG chewable tablet Chew 1 tablet (5 mg total) daily. 30 tablet 11   ??? cholecalciferol, vitamin D3-25 mcg, 1,000 unit,, (VITAMIN D3) 25 mcg (1,000 unit) capsule Take 1 capsule (25 mcg total) by mouth daily. 30 capsule 11   ??? fluticasone propionate (FLONASE) 50 mcg/actuation nasal spray Two sprays each nare twice a day. 16 g 5   ??? gabapentin (NEURONTIN) 300 MG capsule Take 1 capsules (300 mg) by mouth in the morning and at noon, 600 mg in the evening. 360 capsule 11   ??? glipiZIDE (GLUCOTROL) 5 MG tablet Take 1 tablet (5 mg total) by mouth daily. 30 tablet 11   ??? levETIRAcetam (KEPPRA) 500 MG tablet Take 1 tablet (500 mg total) by mouth Two (2) times a day. 60 tablet 0   ??? levETIRAcetam (KEPPRA) 500 MG tablet Take 1 tablet (500 mg total) by mouth Two (2) times a day. 60 tablet 11   ??? magnesium oxide (MAG-OX) 400 mg (241.3 mg elemental magnesium) tablet Take 1 tablet (400 mg total) by mouth daily. 30 tablet 11   ??? miscellaneous medical supply Misc 1 knee brace 1 each 0   ??? mycophenolate (MYFORTIC) 180 MG EC tablet Take 2 tablets (360 mg total) by mouth Three (3) times a day. 180 tablet 11   ??? omeprazole (PRILOSEC) 20 MG capsule Take 1 capsule (20 mg total) by mouth daily. prn 30 capsule 11   ??? sodium bicarbonate 650 mg tablet Take 2 tablets (1300 mg) three times a day. 180 tablet 11   ??? tacrolimus (PROGRAF) 1 MG capsule Take 4 capsules (4 mg total) by mouth two (2) times a day. 240 capsule 11   ??? tamoxifen (NOLVADEX) 20 MG tablet Take 1 tablet (20 mg total) by mouth daily. 90 tablet 0   ??? traMADoL (ULTRAM) 50 mg tablet Take 1 tablet (50 mg total) by mouth every eight (8) hours as needed for pain. 60 tablet 2   ??? valACYclovir (VALTREX) 500 MG tablet Take 1 tablet (500 mg total) by mouth daily. 30 tablet 6     No current facility-administered medications for this visit.        Changes to medications: Nancy Bowman reports no changes at this time.    Allergies   Allergen Reactions   ??? 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 Ibuprofen  Can't take due to kidneys   ??? Penicillins      Patient is unsure if she's allergic, was told she had a reaction as a child but thinks she's taking a cillin while sick without having a reaction.  Pt states she has taken amoxicillin in the past and did not have problem       Changes to allergies: No    SPECIALTY MEDICATION ADHERENCE     Mycophenolate 180 mg: 2 days of medicine on hand   Tacrolimus 1 mg: 2 days of medicine on hand     Medication Adherence    Patient reported X missed doses in the last month: 0  Specialty Medication: Mycophenolate 180mg   Patient is on additional specialty medications: Yes  Additional Specialty Medications: Tacrolimus 1mg   Patient Reported Additional Medication X Missed Doses in the Last Month: 0  Patient is on more than two specialty medications: No  Adherence tools used: patient uses a pill box to manage medications          Specialty medication(s) dose(s) confirmed: Regimen is correct and unchanged.     Are there any concerns with adherence? No    Adherence counseling provided? Not needed    CLINICAL MANAGEMENT AND INTERVENTION      Clinical Benefit Assessment:    Do you feel the medicine is effective or helping your condition? Yes    Clinical Benefit counseling provided? Not needed    Adverse Effects Assessment:    Are you experiencing any side effects? No    Are you experiencing difficulty administering your medicine? No    Quality of Life Assessment:    How many days over the past month did your kidney transplant  keep you from your normal activities? For example, brushing your teeth or getting up in the morning. 0    Have you discussed this with your provider? Not needed    Acute Infection Status:    Acute infections noted within Epic:  No active infections  Patient reported infection: None    Therapy Appropriateness:    Is therapy appropriate? Yes, therapy is appropriate and should be continued    DISEASE/MEDICATION-SPECIFIC INFORMATION      N/A    PATIENT SPECIFIC NEEDS     - Does the patient have any physical, cognitive, or cultural barriers? No    - Is the patient high risk? Yes, patient is taking a REMS drug. Medication is dispensed in compliance with REMS program    - Does the patient require a Care Management Plan? No     - Does the patient require physician intervention or other additional services (i.e. nutrition, smoking cessation, social work)? No      SHIPPING     Specialty Medication(s) to be Shipped:   Transplant:  mycophenolic acid 180mg  and tacrolimus 1mg     Other medication(s) to be shipped: No additional medications requested for fill at this time     Changes to insurance: No    Delivery Scheduled: Yes, Expected medication delivery date: 10/29/20.     Medication will be delivered via UPS to the confirmed prescription address in [facility].    The patient will receive a drug information handout for each medication shipped and additional FDA Medication Guides as required.  Verified that patient has previously received a Conservation officer, historic buildings and a Surveyor, mining.    All of the patient's questions and concerns have been addressed.    [name]   [facility]  Pharmacy Specialty Pharmacist

## 2020-10-30 ENCOUNTER — Ambulatory Visit: Admit: 2020-10-30 | Discharge: 2020-10-31 | Payer: MEDICARE

## 2020-10-30 ENCOUNTER — Encounter: Admit: 2020-10-30 | Discharge: 2020-10-31 | Payer: MEDICARE

## 2020-10-30 NOTE — Unmapped (Signed)
No show in clinic

## 2020-11-13 DIAGNOSIS — Z940 Kidney transplant status: Principal | ICD-10-CM

## 2020-11-13 DIAGNOSIS — Z79899 Other long term (current) drug therapy: Principal | ICD-10-CM

## 2020-11-20 ENCOUNTER — Ambulatory Visit: Admit: 2020-11-20 | Payer: MEDICARE | Attending: Nephrology | Primary: Nephrology

## 2020-11-20 NOTE — Unmapped (Signed)
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     Was a Nurse, learning disability used for this call? No    Completed refill call assessment today to schedule patient's medication shipment from the [facility] [phone]).  All relevant notes have been reviewed.     Specialty medication(s) and dose(s) confirmed: Regimen is correct and unchanged.   Changes to medications: Morning reports no changes at this time.  Changes to insurance: No  New side effects reported not previously addressed with a pharmacist or physician: None reported  Questions for the pharmacist: No    Confirmed patient received a Conservation officer, historic buildings and a Surveyor, mining with first shipment. The patient will receive a drug information handout for each medication shipped and additional FDA Medication Guides as required.       DISEASE/MEDICATION-SPECIFIC INFORMATION        N/A    SPECIALTY MEDICATION ADHERENCE     Medication Adherence    Patient reported X missed doses in the last month: 0  Specialty Medication: Mycophenolate 180mg   Patient is on additional specialty medications: Yes  Additional Specialty Medications: Tacrolimus 1mg   Patient Reported Additional Medication X Missed Doses in the Last Month: 0  Patient is on more than two specialty medications: No  Adherence tools used: patient uses a pill box to manage medications        Were doses missed due to medication being on hold? No    Mycophenolate 180 mg: 11 days of medicine on hand   Tacrolimus 1 mg: 11 days of medicine on hand     REFERRAL TO PHARMACIST     Referral to the pharmacist: Not needed      SHIPPING     Shipping address confirmed in Epic.     Delivery Scheduled: Yes, Expected medication delivery date: 11/27/2020.     Medication will be delivered via UPS to the prescription address in Epic WAM.    Lorelei Pont [facility] Specialty Technician

## 2020-11-20 NOTE — Unmapped (Unsigned)
Transplant Nephrology Clinic Visit      Primary Care Physician: Olevia Perches, DO    History of Present Illness    Patient is a 55 y.o. 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  She was on dialysis approximately 7 years prior to this transplant, the donor kidney had a KDPI of 65%.  She did have issues with hyperkalemia while on dialysis, and was on Kayexalate weekly.  Her post transplant course has been without rejection or recurrent disease. Patient does have evidence of a donor specific antibody to DP01 at an MFI of 2789 (stable) on 08/31/18. Her most recent creatinine has ranged between 1.6 and 2.4.    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     In December 2016, she underwent routine mammogram that was abnormal. Follow-up mammogram in January was again highly suggestive of significant lesion in the left breast. She underwent core biopsy on 09/09/2015 which showed ductal carcinoma in situ. She subsequently underwent a partial mastectomy on 09/24/2015. 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 That final pathology showed a positive medial margin, so she underwent reexcision on 10/25/2015 for which the pathology was negative. Her surgical management was felt to be complete.     The patient was hospitalized in [place] in the beginning of January for 2-3 days with sinus congestion, shortness of breath on exertion, and scant hemoptysis. She was diagnosed with URI and treated with Levaquin. Her symptoms have improved.    Patient was admitted from 1/6 through 08/09/18 for chest pain. Serial troponins negative, echo unremarkable with EF 65-70%.     She was again admitted 1/19 through 08/24/18, after presenting with several days of left lower abdominal pain. CT notable for diverticulitis with leukocytosis on labs. She was treated with IV ciprofloxacin and Flagyl during admission. Discharged home on 8 day course of PO cipro and flagyl. Creatinine remained stable during admission.     She was seen in the ED at [facility] on 12/05/19 complaining of headache, sinus congestion and cough. Nasal spray, antihistamine and Mucinex not helping. She was admitted from 5/4 through 12/06/19 and given Rocephin for possible bacterial sinusitis and continued cefdinir as an outpatient for 5 days. Creatinine 2.76 on presentation, 1.87 on discharge. Blood cultures negative.     Interval history since phone visit on 07/11/2020 10/06/2019:    She presents today for follow up. She has not had labs in our system since her visit 07/11/20.    She has not had a mammogram since 01/14/18 and was a no show for her oncology clinic visit 10/30/20.     She specifically denies fever, myalgias, upper respiratory/GI symptoms, or change in taste/smell.    Last dose of Prograf: *** pm     Review of Systems    Otherwise on review of systems patient denies fever or chills, chest pain, SOB, PND or orthopnea, lower extremity edema.  Denies abdominal pain.  No dysuria, hematuria or difficulty voiding. Bowel movements normal.  Denies joint pain or rash.  All other systems are reviewed and are negative.    Medications    Current Outpatient Medications   Medication Sig Dispense Refill   ??? aspirin 81 MG chewable tablet Chew 1 tablet (81 mg total) daily. 30 tablet 11   ??? atorvastatin (LIPITOR) 40 MG tablet Take 1 tablet (40 mg total) by mouth daily.  30 tablet 11   ??? blood sugar diagnostic Strp by Other route Four (4) times a day (before meals and nightly). 200 each 7   ??? blood-glucose meter kit Use as instructed 1 each 0   ??? blood-glucose meter kit Use as instructed 1 each 0   ??? carvediloL (COREG) 12.5 MG tablet Take 1 tablet (12.5 mg total) by mouth Two (2) times a day. 60 tablet 11   ??? cetirizine (ZYRTEC) 5 MG chewable tablet Chew 1 tablet (5 mg total) daily. 30 tablet 11   ??? cholecalciferol, vitamin D3-25 mcg, 1,000 unit,, (VITAMIN D3) 25 mcg (1,000 unit) capsule Take 1 capsule (25 mcg total) by mouth daily. 30 capsule 11   ??? fluticasone propionate (FLONASE) 50 mcg/actuation nasal spray Two sprays each nare twice a day. 16 g 5   ??? gabapentin (NEURONTIN) 300 MG capsule Take 1 capsules (300 mg) by mouth in the morning and at noon, 600 mg in the evening. 360 capsule 11   ??? glipiZIDE (GLUCOTROL) 5 MG tablet Take 1 tablet (5 mg total) by mouth daily. 30 tablet 11   ??? levETIRAcetam (KEPPRA) 500 MG tablet Take 1 tablet (500 mg total) by mouth Two (2) times a day. 60 tablet 0   ??? levETIRAcetam (KEPPRA) 500 MG tablet Take 1 tablet (500 mg total) by mouth Two (2) times a day. 60 tablet 11   ??? magnesium oxide (MAG-OX) 400 mg (241.3 mg elemental magnesium) tablet Take 1 tablet (400 mg total) by mouth daily. 30 tablet 11   ??? miscellaneous medical supply Misc 1 knee brace 1 each 0   ??? mycophenolate (MYFORTIC) 180 MG EC tablet Take 2 tablets (360 mg total) by mouth Three (3) times a day. 180 tablet 11   ??? omeprazole (PRILOSEC) 20 MG capsule Take 1 capsule (20 mg total) by mouth daily. prn 30 capsule 11   ??? sodium bicarbonate 650 mg tablet Take 2 tablets (1300 mg) three times a day. 180 tablet 11   ??? tacrolimus (PROGRAF) 1 MG capsule Take 4 capsules (4 mg total) by mouth two (2) times a day. 240 capsule 11   ??? tamoxifen (NOLVADEX) 20 MG tablet Take 1 tablet (20 mg total) by mouth daily. 90 tablet 0   ??? traMADoL (ULTRAM) 50 mg tablet Take 1 tablet (50 mg total) by mouth every eight (8) hours as needed for pain. 60 tablet 2   ??? valACYclovir (VALTREX) 500 MG tablet Take 1 tablet (500 mg total) by mouth daily. 30 tablet 6     No current facility-administered medications for this visit.       Physical Exam    LMP 09/04/2015   General: Patient is a pleasant female in no apparent distress.  Eyes: Sclera anicteric.  ENT: Mask in place.  Neck: Supple without LAD/JVD/bruits.  Lungs: Clear to auscultation bilaterally, no wheezes/rales/rhonchi.  Cardiovascular: Regular rate and rhythm without murmurs, rubs or gallops.  Abdomen: Soft, notender/nondistended. Positive bowel sounds. No tenderness over the graft.  Extremities: Without edema, joints without evidence of synovitis.  Skin: Without rash.  Neurological: Grossly nonfocal.  Psychiatric: Mood and affect appropriate.      Laboratory Results    Results for orders placed or performed in visit on 07/11/20   Urine Culture    Specimen: Clean Catch; Urine   Result Value Ref Range    Urine Culture, Comprehensive Mixed Urogenital Flora    Phosphorus Level   Result Value Ref Range    Phosphorus 3.5 2.4 -  5.1 mg/dL   Magnesium Level   Result Value Ref Range    Magnesium 1.8 1.6 - 2.6 mg/dL   Urinalysis   Result Value Ref Range    Color, UA Yellow     Clarity, UA Clear     Specific Gravity, UA 1.020 1.005 - 1.030    pH, UA 6.0 5.0 - 9.0    Leukocyte Esterase, UA Negative Negative    Nitrite, UA Negative Negative    Protein, UA 30 mg/dL (A) Negative    Glucose, UA Negative Negative    Ketones, UA Negative Negative    Urobilinogen, UA 0.2 mg/dL 0.2 - 2.0 mg/dL    Bilirubin, UA Negative Negative    Blood, UA Small (A) Negative    RBC, UA 4 (H) <4 /HPF    WBC, UA 5 0 - 5 /HPF    Squam Epithel, UA <1 0 - 5 /HPF    Bacteria, UA Occasional (A) None Seen /HPF   Protein/Creatinine Ratio, Urine   Result Value Ref Range    Creat U 138.6 Undefined mg/dL    Protein, Ur 09.8 mg/dL    Protein/Creatinine Ratio, Urine 0.282 Undefined   Comprehensive Metabolic Panel   Result Value Ref Range    Sodium 136 135 - 145 mmol/L    Potassium 4.5 3.4 - 4.5 mmol/L    Chloride 106 98 - 107 mmol/L    Anion Gap 4 (L) 5 - 14 mmol/L    CO2 26.0 20.0 - 31.0 mmol/L    BUN 30 (H) 9 - 23 mg/dL    Creatinine 1.19 (H) 0.60 - 0.80 mg/dL    BUN/Creatinine Ratio 17     EGFR CKD-EPI Non-African American, Female 33 (L) >=60 mL/min/1.47m2    EGFR CKD-EPI African American, Female 38 (L) >=60 mL/min/1.26m2    Glucose 166 70 - 179 mg/dL Calcium 14.7 (H) 8.7 - 10.4 mg/dL    Albumin 4.0 3.4 - 5.0 g/dL    Total Protein 7.4 5.7 - 8.2 g/dL    Total Bilirubin 0.4 0.3 - 1.2 mg/dL    AST 11 <=82 U/L    ALT 12 10 - 49 U/L    Alkaline Phosphatase 54 46 - 116 U/L   Tacrolimus Level, Trough   Result Value Ref Range    Tacrolimus, Trough 6.5 5.0 - 15.0 ng/mL   CMV DNA, quantitative, PCR   Result Value Ref Range    CMV Viral Ld Not Detected Not Detected    CMV Quant      CMV Quant Log10      CMV Comment     BK Virus, DNA, Quantitative, Serum   Result Value Ref Range    BK Blood Result Not Detected Not Detected    BK Blood Quant      BK Blood Log(10)      Bk Blood Comment     CBC w/ Differential   Result Value Ref Range    WBC 7.2 3.5 - 10.5 10*9/L    RBC 4.11 3.90 - 5.03 10*12/L    HGB 12.6 12.0 - 15.5 g/dL    HCT 95.6 21.3 - 08.6 %    MCV 94.1 82.0 - 98.0 fL    MCH 30.5 26.0 - 34.0 pg    MCHC 32.4 30.0 - 36.0 g/dL    RDW 57.8 46.9 - 62.9 %    MPV 8.5 7.0 - 10.0 fL    Platelet 238 150 - 450 10*9/L    Neutrophils % 68.0 %  Lymphocytes % 21.3 %    Monocytes % 7.8 %    Eosinophils % 2.1 %    Basophils % 0.8 %    Absolute Neutrophils 4.9 1.7 - 7.7 10*9/L    Absolute Lymphocytes 1.5 0.7 - 4.0 10*9/L    Absolute Monocytes 0.6 0.1 - 1.0 10*9/L    Absolute Eosinophils 0.1 0.0 - 0.7 10*9/L    Absolute Basophils 0.1 0.0 - 0.1 10*9/L     *Note: Due to a large number of results and/or encounters for the requested time period, some results have not been displayed. A complete set of results can be found in Results Review.       Assessment and Plan    1. Status post renal transplant. Her creatinine today of 1.73 is within her baseline. Her prograf trough of 6.5 is a 12 hour trough and at goal of 6-8. Will continue current immunosuppression.    2. Breast cancer status post resection. On tamoxifen and tolerating that well. Had been scheduled for regular oncology follow up and mammography screening in June, but was postponed due to covid. She has not yet rescheduled, was again encouraged to do so.    3. Secondary hyperparathyroidism. Calcium has been mildly elevated, 10.8 today. If remains high may need to start Sensipar.    4. History of hyperkalemia. Potassium normal at 4.5. She has been educated on low potassium diet. Would not treat unless > 6.    5. Metabolic acidosis. HCO3 normal at 26. Continue current supplementation, if remains in normal range may be able to reduce.     6. History of seizure disorder. Patient denies seizure activity and continues on Keppra.     7. Hypertension. Blood pressure controlled on current regimen.     8. Chronic lower back and leg pain. Stable.     9. Diabetes mellitus. Hgb A1c 6.8 in January 2020. Managed elsewhere. Reports good glucose control.    10. Elevated LDL of 103 in 08/2017. Continues to trend down, 27 on last labs though was on 08/31/18. On atorvastatin. Will continue to follow closely, and try to get lipid panel at next visit.     11. Health maintenance. She received a flu shot this past fall. Still needs Shingrix and pneumococcal vaccines. She has completed the COVID vaccine and had a booster today. Got her flu shot today 07/11/20.    We discussed that while we continue to recommend the COVID vaccine for all eligible patients, we know that kidney transplant patients do not respond as strongly due to their immunosuppression, so she should continue to follow the CDC guidelines for patients who are not vaccinated. I would also encourage all household members and close contacts that are eligible for vaccination to get vaccinated. We discussed the importance of ongoing social distancing, wearing a mask outside the home, and hand washing/sanitizing.     12. Will see patient back in 4 months, or sooner if needed.

## 2020-11-26 MED FILL — TACROLIMUS 1 MG CAPSULE, IMMEDIATE-RELEASE: ORAL | 30 days supply | Qty: 240 | Fill #9

## 2020-11-26 MED FILL — MYCOPHENOLATE SODIUM 180 MG TABLET,DELAYED RELEASE: ORAL | 30 days supply | Qty: 180 | Fill #9

## 2020-12-18 NOTE — Unmapped (Signed)
Nancy Bowman, Nancy Bowman.     Was a Nurse, learning disability used for this call? No    Completed refill call assessment today to schedule Bowman's medication shipment from the [facility] [phone]).  All relevant notes have been reviewed.     Specialty medication(s) and dose(s) confirmed: Regimen is correct and unchanged.   Changes to medications: Tanecia reports no changes at this time.  Changes to insurance: No  New side effects reported not previously addressed with a pharmacist or physician: None reported  Questions for the pharmacist: No    Confirmed Bowman received a Conservation officer, historic buildings and a Surveyor, mining with first shipment. The Bowman will receive a drug information handout for each medication shipped and additional FDA Medication Guides as required.       DISEASE/MEDICATION-SPECIFIC INFORMATION        N/A    SPECIALTY MEDICATION ADHERENCE     Medication Adherence    Bowman reported X missed doses in the last month: 0  Specialty Medication: tacrolimus (PROGRAF) 1 MG capsule  Bowman is on additional specialty medications: Yes  Additional Specialty Medications: mycophenolate (MYFORTIC) 180 MG EC tablet  Bowman Reported Additional Medication X Missed Doses in the Last Month: 0  Adherence tools used: Bowman uses a pill box to manage medications              Were doses missed due to medication being on hold? No    mycophenolic acid 180mg  10 days worth of medication on hand.  tacrolimus 1mg   10 days worth of medication on hand.      REFERRAL TO PHARMACIST     Referral to the pharmacist: Not needed      [facility]     Shipping address confirmed in Epic.     Delivery Scheduled: Yes, Expected medication delivery date: 12/25/20.     Medication will be delivered via UPS to the prescription address in Epic WAM.    [place] A Jennalyn Cawley   [facility] Specialty Technician

## 2020-12-19 MED ORDER — TAMOXIFEN 20 MG TABLET
ORAL_TABLET | Freq: Every day | ORAL | 0 refills | 90 days | Status: CP
Start: 2020-12-19 — End: ?

## 2020-12-19 NOTE — Unmapped (Signed)
Please refill if appropriate

## 2020-12-24 MED FILL — TACROLIMUS 1 MG CAPSULE, IMMEDIATE-RELEASE: ORAL | 30 days supply | Qty: 240 | Fill #10

## 2020-12-24 MED FILL — MYCOPHENOLATE SODIUM 180 MG TABLET,DELAYED RELEASE: ORAL | 30 days supply | Qty: 180 | Fill #10

## 2021-01-16 DIAGNOSIS — Z940 Kidney transplant status: Principal | ICD-10-CM

## 2021-01-16 MED ORDER — MYCOPHENOLATE SODIUM 180 MG TABLET,DELAYED RELEASE
ORAL_TABLET | Freq: Three times a day (TID) | ORAL | 11 refills | 30.00000 days
Start: 2021-01-16 — End: 2022-01-16

## 2021-01-16 MED ORDER — TACROLIMUS 1 MG CAPSULE, IMMEDIATE-RELEASE
ORAL_CAPSULE | Freq: Two times a day (BID) | ORAL | 11 refills | 30 days
Start: 2021-01-16 — End: 2022-01-16

## 2021-01-16 NOTE — Unmapped (Signed)
Pt request for RX Refill    tacrolimus (PROGRAF) 1 MG capsule

## 2021-01-16 NOTE — Unmapped (Signed)
[facility] Refill Coordination Note    Specialty Medication(s) to be Shipped:   Transplant: mycophenolate mofetil 180mg  and tacrolimus 1mg     Other medication(s) to be shipped: No additional medications requested for fill at this time     Nancy Bowman, DOB: [birth year]  Phone: [phone] (home)       All above HIPAA information was verified with patient.     Was a Nurse, learning disability used for this call? No    Completed refill call assessment today to schedule patient's medication shipment from the [facility] [phone]).  All relevant notes have been reviewed.     Specialty medication(s) and dose(s) confirmed: Regimen is correct and unchanged.   Changes to medications: Nancy Bowman reports no changes at this time.  Changes to insurance: No  New side effects reported not previously addressed with a pharmacist or physician: None reported  Questions for the pharmacist: No    Confirmed patient received a Conservation officer, historic buildings and a Surveyor, mining with first shipment. The patient will receive a drug information handout for each medication shipped and additional FDA Medication Guides as required.       DISEASE/MEDICATION-SPECIFIC INFORMATION        N/A    SPECIALTY MEDICATION ADHERENCE     Medication Adherence    Patient reported X missed doses in the last month: 0  Specialty Medication: Mycophenolate 180mg   Patient is on additional specialty medications: Yes  Additional Specialty Medications: Tacrolimus 1mg   Patient Reported Additional Medication X Missed Doses in the Last Month: 0  Patient is on more than two specialty medications: No  Any gaps in refill history greater than 2 weeks in the last 3 months: no  Demonstrates understanding of importance of adherence: yes  Adherence tools used: patient uses a pill box to manage medications              Were doses missed due to medication being on hold? No    mycophenolate 180 mg: 8 days of medicine on hand   tacrolimus 1 mg: 8 days of medicine on hand         REFERRAL TO PHARMACIST     Referral to the pharmacist: Not needed      [facility]     Shipping address confirmed in Epic.     Delivery Scheduled: Yes, Expected medication delivery date: 0621.  However, Rx request for refills was sent to the provider as there are none remaining.     Medication will be delivered via Next Day Courier to the prescription address in Epic WAM.    Antonietta Barcelona   [facility] Specialty Technician

## 2021-01-16 NOTE — Unmapped (Signed)
Pt request for RX Refill    mycophenolate (MYFORTIC) 180 MG EC tablet

## 2021-01-17 MED ORDER — TACROLIMUS 1 MG CAPSULE, IMMEDIATE-RELEASE
ORAL_CAPSULE | Freq: Two times a day (BID) | ORAL | 11 refills | 30 days | Status: CP
Start: 2021-01-17 — End: 2022-01-17
  Filled 2021-01-20: qty 240, 30d supply, fill #0

## 2021-01-17 MED ORDER — MYCOPHENOLATE SODIUM 180 MG TABLET,DELAYED RELEASE
ORAL_TABLET | Freq: Three times a day (TID) | ORAL | 11 refills | 30 days | Status: CP
Start: 2021-01-17 — End: 2022-01-17
  Filled 2021-01-20: qty 180, 30d supply, fill #0

## 2021-02-11 NOTE — Unmapped (Signed)
[facility] Refill Coordination Note    Specialty Medication(s) to be Shipped:   Transplant: mycophenolate mofetil 180mg  and tacrolimus 1mg     Other medication(s) to be shipped: No additional medications requested for fill at this time     Nancy Bowman, DOB: November 13, 1965  Phone: [phone] (home)       All above HIPAA information was verified with patient.     Was a Nurse, learning disability used for this call? No    Completed refill call assessment today to schedule patient's medication shipment from the [facility] [phone]).  All relevant notes have been reviewed.     Specialty medication(s) and dose(s) confirmed: Regimen is correct and unchanged.   Changes to medications: Nancy Bowman reports no changes at this time.  Changes to insurance: No  New side effects reported not previously addressed with a pharmacist or physician: None reported  Questions for the pharmacist: No    Confirmed patient received a Conservation officer, historic buildings and a Surveyor, mining with first shipment. The patient will receive a drug information handout for each medication shipped and additional FDA Medication Guides as required.       DISEASE/MEDICATION-SPECIFIC INFORMATION        N/A    SPECIALTY MEDICATION ADHERENCE     Medication Adherence    Patient reported X missed doses in the last month: 0  Specialty Medication: Mycophenolate 180mg   Patient is on additional specialty medications: Yes  Additional Specialty Medications: Tacrolimus 1mg   Patient Reported Additional Medication X Missed Doses in the Last Month: 0  Patient is on more than two specialty medications: No  Adherence tools used: patient uses a pill box to manage medications        Were doses missed due to medication being on hold? No    Mycophenolate 180 mg: 10 days of medicine on hand   Tacrolimus 1 mg: 10 days of medicine on hand     REFERRAL TO PHARMACIST     Referral to the pharmacist: Not needed      [facility]     Shipping address confirmed in Epic.     Delivery Scheduled: Yes, Expected medication delivery date: 02/18/2021.     Medication will be delivered via Next Day Courier to the prescription address in Epic WAM.    Oretha Milch   [facility] Specialty Technician

## 2021-02-17 MED FILL — MYCOPHENOLATE SODIUM 180 MG TABLET,DELAYED RELEASE: ORAL | 30 days supply | Qty: 180 | Fill #1

## 2021-02-17 MED FILL — TACROLIMUS 1 MG CAPSULE, IMMEDIATE-RELEASE: ORAL | 30 days supply | Qty: 240 | Fill #1

## 2021-03-14 NOTE — Unmapped (Signed)
[facility] Clinical Assessment & Refill Coordination Note    Nancy Bowman, DOB: [birth year]  Phone: [phone] (home)     All above HIPAA information was verified with patient.     Was a Nurse, learning disability used for this call? 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 aspirin 81 MG chewable tablet Chew 1 tablet (81 mg total) daily. 30 tablet 11   ??? atorvastatin (LIPITOR) 40 MG tablet Take 1 tablet (40 mg total) by mouth daily. 30 tablet 11   ??? blood sugar diagnostic Strp by Other route Four (4) times a day (before meals and nightly). 200 each 7   ??? blood-glucose meter kit Use as instructed 1 each 0   ??? blood-glucose meter kit Use as instructed 1 each 0   ??? carvediloL (COREG) 12.5 MG tablet Take 1 tablet (12.5 mg total) by mouth Two (2) times a day. 60 tablet 11   ??? cetirizine (ZYRTEC) 5 MG chewable tablet Chew 1 tablet (5 mg total) daily. 30 tablet 11   ??? cholecalciferol, vitamin D3-25 mcg, 1,000 unit,, (VITAMIN D3) 25 mcg (1,000 unit) capsule Take 1 capsule (25 mcg total) by mouth daily. 30 capsule 11   ??? fluticasone propionate (FLONASE) 50 mcg/actuation nasal spray Two sprays each nare twice a day. 16 g 5   ??? gabapentin (NEURONTIN) 300 MG capsule Take 1 capsules (300 mg) by mouth in the morning and at noon, 600 mg in the evening. 360 capsule 11   ??? glipiZIDE (GLUCOTROL) 5 MG tablet Take 1 tablet (5 mg total) by mouth daily. 30 tablet 11   ??? levETIRAcetam (KEPPRA) 500 MG tablet Take 1 tablet (500 mg total) by mouth Two (2) times a day. 60 tablet 0   ??? levETIRAcetam (KEPPRA) 500 MG tablet Take 1 tablet (500 mg total) by mouth Two (2) times a day. 60 tablet 11   ??? magnesium oxide (MAG-OX) 400 mg (241.3 mg elemental magnesium) tablet Take 1 tablet (400 mg total) by mouth daily. 30 tablet 11   ??? miscellaneous medical supply Misc 1 knee brace 1 each 0   ??? mycophenolate (MYFORTIC) 180 MG EC tablet Take 2 tablets (360 mg total) by mouth Three (3) times a day. 180 tablet 11   ??? omeprazole (PRILOSEC) 20 MG capsule Take 1 capsule (20 mg total) by mouth daily. prn 30 capsule 11   ??? sodium bicarbonate 650 mg tablet Take 2 tablets (1300 mg) three times a day. 180 tablet 11   ??? tacrolimus (PROGRAF) 1 MG capsule Take 4 capsules (4 mg total) by mouth two (2) times a day. 240 capsule 11   ??? tamoxifen (NOLVADEX) 20 MG tablet Take 1 tablet (20 mg total) by mouth daily. 90 tablet 0   ??? traMADoL (ULTRAM) 50 mg tablet Take 1 tablet (50 mg total) by mouth every eight (8) hours as needed for pain. 60 tablet 2   ??? valACYclovir (VALTREX) 500 MG tablet Take 1 tablet (500 mg total) by mouth daily. 30 tablet 6     No current facility-administered medications for this visit.        Changes to medications: Nancy Bowman reports no changes at this time.    Allergies   Allergen Reactions   ??? 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 Ibuprofen  Can't take due to kidneys   ??? Penicillins      Patient is unsure if she's allergic, was told she had a reaction as a child but thinks she's taking a cillin while sick without having a reaction.  Pt states she has taken amoxicillin in the past and did not have problem       Changes to allergies: No    SPECIALTY MEDICATION ADHERENCE     Tacrolimus 1mg   : 10 days of medicine on hand   Mycophenolate 180mg   : 10 days of medicine on hand     Medication Adherence    Patient reported X missed doses in the last month: 0  Specialty Medication: mycophenolate 180mg   Patient is on additional specialty medications: Yes  Additional Specialty Medications: Tacrolimus 1mg   Patient Reported Additional Medication X Missed Doses in the Last Month: 0  Adherence tools used: patient uses a pill box to manage medications          Specialty medication(s) dose(s) confirmed: Regimen is correct and unchanged.     Are there any concerns with adherence? No    Adherence counseling provided? Not needed    CLINICAL MANAGEMENT AND INTERVENTION      Clinical Benefit Assessment:    Do you feel the medicine is effective or helping your condition? Yes    Clinical Benefit counseling provided? Not needed    Adverse Effects Assessment:    Are you experiencing any side effects? No    Are you experiencing difficulty administering your medicine? No    Quality of Life Assessment:      How many days over the past month did your transplant  keep you from your normal activities? For example, brushing your teeth or getting up in the morning. 0    Have you discussed this with your provider? Not needed    Acute Infection Status:    Acute infections noted within Epic:  No active infections  Patient reported infection: None    Therapy Appropriateness:    Is therapy appropriate? Yes, therapy is appropriate and should be continued    DISEASE/MEDICATION-SPECIFIC INFORMATION      N/A    PATIENT SPECIFIC NEEDS     - Does the patient have any physical, cognitive, or cultural barriers? No    - Is the patient high risk? Yes, patient is taking a REMS drug. Medication is dispensed in compliance with REMS program    - Does the patient require a Care Management Plan? No     - Does the patient require physician intervention or other additional services (i.e. nutrition, smoking cessation, social work)? 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     Medication will be delivered via UPS to the confirmed prescription address in [facility].    The patient will receive a drug information handout for each medication shipped and additional FDA Medication Guides as required.  Verified that patient has previously received a Conservation officer, historic buildings and a Surveyor, mining.    The patient or caregiver noted above participated in the development of this care plan and knows that they can request review of or adjustments to the care plan at  any time.      All of the patient's questions and concerns have been addressed.    Thad Ranger   [facility] Specialty Pharmacist

## 2021-03-18 MED FILL — MYCOPHENOLATE SODIUM 180 MG TABLET,DELAYED RELEASE: ORAL | 30 days supply | Qty: 180 | Fill #2

## 2021-03-18 MED FILL — TACROLIMUS 1 MG CAPSULE, IMMEDIATE-RELEASE: ORAL | 30 days supply | Qty: 240 | Fill #2

## 2021-03-27 DIAGNOSIS — B009 Herpesviral infection, unspecified: Principal | ICD-10-CM

## 2021-03-27 DIAGNOSIS — Z940 Kidney transplant status: Principal | ICD-10-CM

## 2021-03-27 NOTE — Unmapped (Signed)
Called pt to check in and see if she needed anything and introduce self as TNC. No answer. VM left for pt to call TNC, phone number left.   On VM, pt was asked to get some updated labs, order at [facility] and that [facility] would have scheduler call her to set up appt with Dr. Carlene Coria.

## 2021-04-01 NOTE — Unmapped (Signed)
[employer] forms

## 2021-04-09 NOTE — Unmapped (Signed)
[facility] Refill Coordination Note    Specialty Medication(s) to be Shipped:   Transplant: Myfortic 180mg  and tacrolimus 1mg     Other medication(s) to be shipped: No additional medications requested for fill at this time     Nancy Bowman, DOB: [birth year]  Phone: [phone] (home)       All above HIPAA information was verified with patient.     Was a Nurse, learning disability used for this call? No    Completed refill call assessment today to schedule patient's medication shipment from the [facility] [phone]).  All relevant notes have been reviewed.     Specialty medication(s) and dose(s) confirmed: Regimen is correct and unchanged.   Changes to medications: Nancy Bowman reports no changes at this time.  Changes to insurance: No  New side effects reported not previously addressed with a pharmacist or physician: None reported  Questions for the pharmacist: No    Confirmed patient received a Conservation officer, historic buildings and a Surveyor, mining with first shipment. The patient will receive a drug information handout for each medication shipped and additional FDA Medication Guides as required.       DISEASE/MEDICATION-SPECIFIC INFORMATION        N/A    SPECIALTY MEDICATION ADHERENCE     Medication Adherence    Patient reported X missed doses in the last month: 0  Specialty Medication: Mycophenolate 180mg   Patient is on additional specialty medications: Yes  Additional Specialty Medications: Tacrolimus 1mg   Patient Reported Additional Medication X Missed Doses in the Last Month: 0  Patient is on more than two specialty medications: No  Any gaps in refill history greater than 2 weeks in the last 3 months: no  Demonstrates understanding of importance of adherence: yes  Informant: patient  Adherence tools used: patient uses a pill box to manage medications              Were doses missed due to medication being on hold? No    Myfortic 180mg : Patient has 14 days of medication on hand  Tacrolimus 1mg : Patient has 14 days of medication on hand     REFERRAL TO PHARMACIST     Referral to the pharmacist: Not needed      [facility]     Shipping address confirmed in Epic.     Delivery Scheduled: Yes, Expected medication delivery date: 9/15.     Medication will be delivered via Next Day Courier to the prescription address in Epic WAM.    Nancy Bowman   [facility] Specialty Technician

## 2021-04-10 MED ORDER — TAMOXIFEN 20 MG TABLET
ORAL_TABLET | Freq: Every day | ORAL | 0 refills | 90.00000 days | Status: CP
Start: 2021-04-10 — End: ?

## 2021-04-10 NOTE — Unmapped (Signed)
Please refill if appropriate

## 2021-04-16 MED FILL — TACROLIMUS 1 MG CAPSULE, IMMEDIATE-RELEASE: ORAL | 30 days supply | Qty: 240 | Fill #3

## 2021-04-16 MED FILL — MYCOPHENOLATE SODIUM 180 MG TABLET,DELAYED RELEASE: ORAL | 30 days supply | Qty: 180 | Fill #3

## 2021-05-09 ENCOUNTER — Ambulatory Visit: Admit: 2021-05-09 | Payer: MEDICARE

## 2021-05-13 NOTE — Unmapped (Signed)
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     Was a Nurse, learning disability used for this call? No    Completed refill call assessment today to schedule patient's medication shipment from the [facility] [phone]).  All relevant notes have been reviewed.     Specialty medication(s) and dose(s) confirmed: Regimen is correct and unchanged.   Changes to medications: Kamylah reports no changes at this time.  Changes to insurance: No  New side effects reported not previously addressed with a pharmacist or physician: None reported  Questions for the pharmacist: No    Confirmed patient received a Conservation officer, historic buildings and a Surveyor, mining with first shipment. The patient will receive a drug information handout for each medication shipped and additional FDA Medication Guides as required.       DISEASE/MEDICATION-SPECIFIC INFORMATION        N/A    SPECIALTY MEDICATION ADHERENCE     Medication Adherence    Patient reported X missed doses in the last month: 0  Specialty Medication: mycophenolate 180mg   Patient is on additional specialty medications: Yes  Additional Specialty Medications: Tacrolimus 1mg   Patient Reported Additional Medication X Missed Doses in the Last Month: 0  Adherence tools used: patient uses a pill box to manage medications              Were doses missed due to medication being on hold? No    Mycophenolate 180mg : 5  days of medicine on hand   Tacrolimus 1mg   : 5 days of medicine on hand        REFERRAL TO PHARMACIST     Referral to the pharmacist: Not needed      [facility]     Shipping address confirmed in Epic.     Delivery Scheduled: Yes, Expected medication delivery date: 05/15/2021.     Medication will be delivered via UPS to the prescription address in Epic WAM.    Thad Ranger   [facility] Specialty Pharmacist

## 2021-05-14 MED FILL — TACROLIMUS 1 MG CAPSULE, IMMEDIATE-RELEASE: ORAL | 30 days supply | Qty: 240 | Fill #4

## 2021-05-14 MED FILL — MYCOPHENOLATE SODIUM 180 MG TABLET,DELAYED RELEASE: ORAL | 30 days supply | Qty: 180 | Fill #4

## 2021-05-15 DIAGNOSIS — Z940 Kidney transplant status: Principal | ICD-10-CM

## 2021-05-15 DIAGNOSIS — Z79899 Other long term (current) drug therapy: Principal | ICD-10-CM

## 2021-05-20 ENCOUNTER — Emergency Department
Admission: EM | Admit: 2021-05-20 | Discharge: 2021-05-20 | Disposition: A | Discharge status: Home / Self Care | Payer: Medicare Other | Attending: Emergency Medicine | Admitting: Emergency Medicine

## 2021-05-20 ENCOUNTER — Other Ambulatory Visit: Payer: Self-pay

## 2021-05-20 ENCOUNTER — Emergency Department: Payer: Medicare Other

## 2021-05-20 ENCOUNTER — Encounter: Payer: Self-pay | Admitting: Emergency Medicine

## 2021-05-20 DIAGNOSIS — I120 Hypertensive chronic kidney disease with stage 5 chronic kidney disease or end stage renal disease: Secondary | ICD-10-CM | POA: Insufficient documentation

## 2021-05-20 DIAGNOSIS — R110 Nausea: Secondary | ICD-10-CM | POA: Diagnosis not present

## 2021-05-20 DIAGNOSIS — Z87891 Personal history of nicotine dependence: Secondary | ICD-10-CM | POA: Insufficient documentation

## 2021-05-20 DIAGNOSIS — Z7984 Long term (current) use of oral hypoglycemic drugs: Secondary | ICD-10-CM | POA: Diagnosis not present

## 2021-05-20 DIAGNOSIS — R079 Chest pain, unspecified: Secondary | ICD-10-CM | POA: Diagnosis not present

## 2021-05-20 DIAGNOSIS — E1122 Type 2 diabetes mellitus with diabetic chronic kidney disease: Secondary | ICD-10-CM | POA: Insufficient documentation

## 2021-05-20 DIAGNOSIS — Z853 Personal history of malignant neoplasm of breast: Secondary | ICD-10-CM | POA: Insufficient documentation

## 2021-05-20 DIAGNOSIS — Z7982 Long term (current) use of aspirin: Secondary | ICD-10-CM | POA: Insufficient documentation

## 2021-05-20 DIAGNOSIS — U071 COVID-19: Secondary | ICD-10-CM | POA: Diagnosis not present

## 2021-05-20 DIAGNOSIS — I2510 Atherosclerotic heart disease of native coronary artery without angina pectoris: Secondary | ICD-10-CM | POA: Diagnosis not present

## 2021-05-20 DIAGNOSIS — N186 End stage renal disease: Secondary | ICD-10-CM | POA: Diagnosis not present

## 2021-05-20 DIAGNOSIS — R1084 Generalized abdominal pain: Secondary | ICD-10-CM | POA: Diagnosis not present

## 2021-05-20 DIAGNOSIS — Z79899 Other long term (current) drug therapy: Secondary | ICD-10-CM | POA: Insufficient documentation

## 2021-05-20 DIAGNOSIS — R519 Headache, unspecified: Secondary | ICD-10-CM | POA: Diagnosis present

## 2021-05-20 DIAGNOSIS — R002 Palpitations: Secondary | ICD-10-CM | POA: Insufficient documentation

## 2021-05-20 DIAGNOSIS — Z992 Dependence on renal dialysis: Secondary | ICD-10-CM | POA: Diagnosis not present

## 2021-05-20 DIAGNOSIS — R0789 Other chest pain: Secondary | ICD-10-CM | POA: Diagnosis not present

## 2021-05-20 LAB — CBC
HCT: 39.4 % (ref 36.0–46.0)
Hemoglobin: 12.4 g/dL (ref 12.0–15.0)
MCH: 30.3 pg (ref 26.0–34.0)
MCHC: 31.5 g/dL (ref 30.0–36.0)
MCV: 96.3 fL (ref 80.0–100.0)
Platelets: 227 10*3/uL (ref 150–400)
RBC: 4.09 MIL/uL (ref 3.87–5.11)
RDW: 13 % (ref 11.5–15.5)
WBC: 6.2 10*3/uL (ref 4.0–10.5)
nRBC: 0 % (ref 0.0–0.2)

## 2021-05-20 LAB — BASIC METABOLIC PANEL
Anion gap: 5 (ref 5–15)
BUN: 32 mg/dL — ABNORMAL HIGH (ref 6–20)
CO2: 24 mmol/L (ref 22–32)
Calcium: 10 mg/dL (ref 8.9–10.3)
Chloride: 108 mmol/L (ref 98–111)
Creatinine, Ser: 1.95 mg/dL — ABNORMAL HIGH (ref 0.44–1.00)
GFR, Estimated: 30 mL/min — ABNORMAL LOW (ref >60)
Glucose, Bld: 148 mg/dL — ABNORMAL HIGH (ref 70–99)
Potassium: 5 mmol/L (ref 3.5–5.1)
Sodium: 137 mmol/L (ref 135–145)

## 2021-05-20 LAB — RESP PANEL BY RT-PCR (FLU A&B, COVID) ARPGX2
Influenza A by PCR: NEGATIVE
Influenza B by PCR: NEGATIVE
SARS Coronavirus 2 by RT PCR: POSITIVE — AB

## 2021-05-20 LAB — TROPONIN I (HIGH SENSITIVITY)
Troponin I (High Sensitivity): 12 ng/L (ref <18)
Troponin I (High Sensitivity): 10 ng/L (ref <18)

## 2021-05-20 MED ORDER — METOCLOPRAMIDE HCL 10 MG PO TABS
10.0000 mg | ORAL_TABLET | Freq: Once | ORAL | Status: AC
  Administered 2021-05-20: 10 mg via ORAL
  Filled 2021-05-20: qty 1

## 2021-05-20 MED ORDER — BEBTELOVIMAB 175 MG/2 ML IV (EUA)
175.0000 mg | Freq: Once | INTRAMUSCULAR | Status: AC
  Administered 2021-05-20: 175 mg via INTRAVENOUS
  Filled 2021-05-20: qty 2

## 2021-05-20 MED ORDER — ACETAMINOPHEN 500 MG PO TABS
1000.0000 mg | ORAL_TABLET | Freq: Once | ORAL | Status: AC
  Administered 2021-05-20: 1000 mg via ORAL
  Filled 2021-05-20: qty 2

## 2021-05-20 NOTE — ED Notes (Signed)
Observation of post monoclonal administration complete at 2145 for discharge.

## 2021-05-20 NOTE — ED Provider Notes (Signed)
Emergency Medicine Provider Triage Evaluation Note  [name], a 55 y.o. female  was evaluated in triage.  Pt complains of palpitations, chest pain, headache, and cough.  Patient also no sinus congestion noted symptoms began when the weather change.  Denies any sick contacts or recent travel.  Review of Systems  Positive: Cough, congestion, palpitations Negative: FCS  Physical Exam  BP 115/83 (BP Location: Left Arm)   Pulse 80   Temp 99.1 F (37.3 C) (Oral)   Resp 20   Ht 5' 5.5" (1.664 m)   Wt 88.5 kg   LMP 03/03/2016 (Approximate)   SpO2 100%   BMI 31.97 kg/m  Gen:   Awake, no distress  NAD Resp:  Normal effort rhonchi noted MSK:   Moves extremities without difficulty  Other:  CVS: tachy rate  Medical Decision Making  Medically screening exam initiated at 1:39 PM.  Appropriate orders placed.  [name] was informed that the remainder of the evaluation will be completed by another provider, this initial triage assessment does not replace that evaluation, and the importance of remaining in the ED until their evaluation is complete.  Patient ED evaluation of cough, congestion, and elevated heart rate.  She denies any fevers, chills, or sweats.   Melvenia Needles, PA-C 05/20/21 1353    Nance Pear, MD 05/20/21 1452

## 2021-05-20 NOTE — ED Provider Notes (Signed)
[facility]  ____________________________________________   Event Date/Time   First MD Initiated Contact with Patient 05/20/21 1652     (approximate)  I have reviewed the triage vital signs and the nursing notes.   HISTORY  Chief Complaint Cough, Headache, Palpitations, and Chest Pain    HPI [name] is a 55 y.o. female past medical history of prior ESRD s/p renal transplant in 10/25/2013,, breast cancer on tamoxifen, coronary artery disease who presents with headache, congestion, fever chest pain.  Symptoms started last week.  She first noticed significant congestion and runny nose.  Has had cough productive of clear sputum and mild dyspnea.  She has not had a headache that is right-sided over the last 2 days.  There is no associated visual change, new numbness or weakness.  Patient also endorses chest pain.  Describes as pressure-like sensation that is intermittent, nonexertional nonpleuritic.  Patient denies sick contacts.  Has had nausea but no vomiting, no diarrhea.  Mild diffuse abdominal discomfort.  101 yesterday.  Patient is on Prograf and mycophenolate for immunosuppression.           Past Medical History:  Diagnosis Date   Arrhythmia    Atypical chest pain 08/09/2018   Cerebrovascular accident [facility]) Oct 25, 2008   without any focal neurological deficits.    Clotted renal dialysis AV graft (HCC)    Compression of vein 08/16/2018   Dependence on hemodialysis ([place]) 11/17/2013   Diabetes mellitus without complication ([place])    Ductal carcinoma in situ (DCIS) of left breast    End stage renal disease ([place])    a. previously on dialysis on Tues, Thurs, & 2022-10-26 x 7 yrs; b. s/p deceased donor transplant March 26, 2014    GERD (gastroesophageal reflux disease)    History of methicillin resistant Staphylococcus aureus infection    History of stress test    a. 04/2013: normal, EF 65%   HIT (heparin-induced thrombocytopenia)    Hypertension    Mitral regurgitation     a. 01/2014: EF >55%, LVH, mild MR, dilated LA   Renal transplant, status post 03/26/2014   RLS (restless legs syndrome)    Seizure disorder (HCC)    Seizures ([place])    Sepsis ([place]) 08/09/2016   Stroke due to intracerebral hemorrhage ([place]) 11/17/2013    Patient Active Problem List   Diagnosis Date Noted   Sinusitis, acute 12/05/2019   Acute sinusitis 12/05/2019   Acute diverticulitis 25/85/2778   Diastolic dysfunction without heart failure 08/16/2018   Immunosuppressed status ([place]) 08/16/2018   Chronic bilateral low back pain with left-sided sciatica 09/27/2017   Age-related nuclear cataract of both eyes 08/19/2017   Hyperopia with astigmatism and presbyopia, bilateral 08/19/2017   Hypertensive retinopathy of both eyes 08/19/2017   DM (diabetes mellitus), type 2 with renal complications ([place]) 24/23/5361   Vitamin D deficiency 10/02/2016   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   CKD (chronic kidney disease) stage 3, GFR 30-59 ml/min (HCC) 08/17/2016   Neuropathic pain of right thigh 08/17/2016   History of CVA (cerebrovascular accident) 08/17/2016   Ductal carcinoma in situ (DCIS) of left breast 09/12/2015   Mitral regurgitation    Renal transplant, status post 2014/03/26   GERD (gastroesophageal reflux disease) 2014-03-26   SVT (supraventricular tachycardia) ([place]) 11/17/2013   Seizure disorder ([place]) 11/17/2013   Benign hypertensive renal disease 11/17/2013    Past Surgical History:  Procedure Laterality Date   INSERTION OF DIALYSIS CATHETER     x 2   KIDNEY  TRANSPLANT  03/2014   right side.   MASTECTOMY PARTIAL / LUMPECTOMY Left 09/24/2015   MASTECTOMY PARTIAL / LUMPECTOMY Left 10/25/2015    Prior to Admission medications   Medication Sig Start Date End Date Taking? Authorizing Provider  aspirin EC 81 MG tablet Take 81 mg by mouth daily.    [provider]  atorvastatin (LIPITOR) 40 MG tablet Take 1 tablet (40 mg total) by mouth daily. 08/16/18    Park Liter P, DO  carvedilol (COREG) 12.5 MG tablet Take 12.5 mg by mouth 2 (two) times daily with a meal.  08/06/16   [provider]  cetirizine (ZYRTEC) 10 MG tablet Take 1 tablet (10 mg total) by mouth daily as needed for allergies or rhinitis. Patient taking differently: Take 10 mg by mouth daily.  08/16/18   Johnson, Megan P, DO  cholecalciferol (VITAMIN D) 25 MCG (1000 UT) tablet Take 1,000 Units by mouth daily.    [provider]  docusate sodium (COLACE) 100 MG capsule Take 100 mg by mouth daily.    [provider]  fluticasone (FLONASE) 50 MCG/ACT nasal spray Two sprays each NOSTRIL twice a day. 06/19/20   Johnson, Megan P, DO  gabapentin (NEURONTIN) 300 MG capsule Take 2 capsules (600 mg total) by mouth 3 (three) times daily. Patient taking differently: Take 300-600 mg by mouth See admin instructions. Take 1 capsule (300mg ) by mouth every morning and 1 capsule (300mg ) and lunchtime then take 2 capsules (600mg ) by mouth every night at bedtime 02/17/19   Johnson, Megan P, DO  glipiZIDE (GLUCOTROL) 5 MG tablet Take 5 mg by mouth daily before breakfast.    [provider]  HYDROcodone-acetaminophen (NORCO/VICODIN) 5-325 MG tablet Take 1 tablet by mouth every 6 (six) hours as needed for moderate pain.     [provider]  levETIRAcetam (KEPPRA) 500 MG tablet Take 500 mg by mouth 2 (two) times daily.  11/09/16   [provider]  magnesium oxide (MAG-OX) 400 MG tablet Take 400 mg by mouth daily.     [provider]  mycophenolate (MYFORTIC) 180 MG EC tablet Take 360 mg by mouth 3 (three) times daily.     [provider]  nitroGLYCERIN (NITROSTAT) 0.4 MG SL tablet Place 1 tablet (0.4 mg total) under the tongue every 5 (five) minutes x 3 doses as needed for chest pain. 08/09/18   Demetrios Loll, MD  omeprazole (PRILOSEC) 20 MG capsule Take 1 capsule (20 mg total) by mouth daily as needed (GERD or heartburn). 04/11/20   Johnson, Megan P,  DO  sodium bicarbonate 650 MG tablet Take 1,300 mg by mouth 3 (three) times daily.     [provider]  tacrolimus (PROGRAF) 1 MG capsule Take 4 mg by mouth 2 (two) times daily.  06/06/14   [provider]  tamoxifen (NOLVADEX) 20 MG tablet Take 20 mg by mouth daily. 12/07/16   [provider]  valACYclovir (VALTREX) 1000 MG tablet Take 1 tablet (1,000 mg total) by mouth 2 (two) times daily. 02/26/20   Park Liter P, DO    Allergies Heparin, Penicillin g, Ibuprofen, Other, and Penicillins  Family History  Problem Relation Age of Onset   Heart attack Mother    Hypertension Mother    Hyperlipidemia Mother    Cancer Mother        breast   Heart attack Father    Hypertension Father    Hyperlipidemia Father    Kidney failure Sister  half sister   Diabetes Maternal Grandmother    Hearing loss Maternal Grandmother    Heart failure Maternal Grandmother    Diabetes Maternal Grandfather    Stroke Maternal Grandfather    Cancer Paternal Grandmother        breast   Stroke Paternal Grandfather    Aneurysm Brother        Brain   Other Sister        Nerve problems    Social History Social History   Tobacco Use   Smoking status: Former    Types: Cigarettes    Quit date: 11/25/2008    Years since quitting: 12.4   Smokeless tobacco: Never  Vaping Use   Vaping Use: Never used  Substance Use Topics   Alcohol use: Yes    Alcohol/week: 2.0 standard drinks    Types: 2 Glasses of wine per week   Drug use: No    Types: "Crack" cocaine    Comment: quit in 2003    Review of Systems   Review of Systems  Constitutional:  Positive for chills and fever.  Eyes:  Negative for visual disturbance.  Respiratory:  Positive for cough, chest tightness and shortness of breath.   Gastrointestinal:  Positive for abdominal pain and nausea. Negative for diarrhea and vomiting.  Genitourinary:  Negative for dysuria.  Neurological:  Positive for headaches. Negative for  weakness and numbness.  All other systems reviewed and are negative.  Physical Exam Updated Vital Signs BP 120/80 (BP Location: Left Arm)   Pulse 82   Temp 99 F (37.2 C) (Oral)   Resp 18   Ht 5' 5.5" (1.664 m)   Wt 88.5 kg   LMP 03/03/2016 (Approximate)   SpO2 100%   BMI 31.97 kg/m   Physical Exam Vitals and nursing note reviewed.  Constitutional:      General: She is not in acute distress.    Appearance: Normal appearance. She is well-developed.  HENT:     Head: Normocephalic and atraumatic.     Mouth/Throat:     Mouth: Mucous membranes are moist.     Pharynx: Oropharynx is clear.  Eyes:     General: No scleral icterus.    Extraocular Movements: Extraocular movements intact.     Conjunctiva/sclera: Conjunctivae normal.     Pupils: Pupils are equal, round, and reactive to light.  Cardiovascular:     Rate and Rhythm: Normal rate and regular rhythm.  Pulmonary:     Effort: Pulmonary effort is normal. No respiratory distress.     Breath sounds: No stridor. No wheezing or rales.  Abdominal:     General: There is no distension.     Palpations: Abdomen is soft.     Tenderness: There is no abdominal tenderness.  Musculoskeletal:        General: No deformity or signs of injury.     Cervical back: Normal range of motion.  Skin:    General: Skin is warm and dry.     Coloration: Skin is not jaundiced or pale.  Neurological:     General: No focal deficit present.     Mental Status: She is alert and oriented to person, place, and time. Mental status is at baseline.     Comments: Aox3, nml speech  PERRL, EOMI, face symmetric, nml tongue movement  5/5 strength in the BL upper and lower extremities  Sensation grossly intact in the BL upper and lower extremities  Finger-nose-finger intact BL   Psychiatric:  Mood and Affect: Mood normal.        Behavior: Behavior normal.     LABS (all labs ordered are listed, but only abnormal results are displayed)  Labs  Reviewed  RESP PANEL BY RT-PCR (FLU A&B, COVID) ARPGX2 - Abnormal; Notable for the following components:      Result Value   SARS Coronavirus 2 by RT PCR POSITIVE (*)    All other components within normal limits  BASIC METABOLIC PANEL - Abnormal; Notable for the following components:   Glucose, Bld 148 (*)    BUN 32 (*)    Creatinine, Ser 1.95 (*)    GFR, Estimated 30 (*)    All other components within normal limits  CBC  POC URINE PREG, ED  TROPONIN I (HIGH SENSITIVITY)  TROPONIN I (HIGH SENSITIVITY)   ____________________________________________  EKG  Sinus rhythm, normal axis, there is concave ST elevation in inferior leads which is unchanged from prior, no acute ischemic changes ____________________________________________  RADIOLOGY I, Madelin Headings, personally viewed and evaluated these images (plain radiographs) as part of my medical decision making, as well as reviewing the written report by the radiologist.  ED MD interpretation:  I reviewed the CXR which does not show any acute cardiopulmonary process      ____________________________________________   PROCEDURES  Procedure(s) performed (including Critical Care):  Procedures   ____________________________________________   INITIAL IMPRESSION / ASSESSMENT AND PLAN / ED COURSE     55 year old female with multiple comorbidities including status post renal transplant and breast cancer on tamoxifen who presents with multiple symptoms including headache chest pain congestion nausea etc.  Vital signs within normal limits.  She is overall well-appearing on exam.  Lungs are clear, she has no increased work of breathing, abdomen is benign does not appear volume overloaded.  Her neurologic exam is reassuring.  Patient's blood work also overall reassuring.  Her kidney function is at baseline.  COVID test is positive which I think explains her constellation of symptoms she is having.  Pt does have CP, however I feel  that this is most likely in the setting of viral illness.  Her EKG does have some abnormalities in the inferior leads however this is unchanged from prior.  She is not having active chest pain currently.  Trop x 2 are negative. Patient also w/ HA and hx of cerebral aneurysm however she appears comfortable now, with non-focal neurologic exam, and if this were 2/2 SAH would expect her to look much worse after 2 days of pain. Again I think this is in the s/o viral illness.   I spoke with Dr. Toy Cookey with transplant nephrology at [facility] who recommends administering the monoclonal antibody bedtelovimab if available which is what their protocol has been for the transplant patients.  I confirmed that there is no need to hold her immunosuppression.  Called pharmacy and we do have this available.  Patient is agreeable to receive the infusion.  Main side effects are allergic reaction so we will watch for an hour afterward.  Pt will follow-up with the transplant team.   ____________________________________________   FINAL CLINICAL IMPRESSION(S) / ED DIAGNOSES  Final diagnoses:  [id number]     ED Discharge Orders     None        Note:  This document was prepared using Dragon voice recognition software and may include unintentional dictation errors.    Rada Hay, MD 05/20/21 [phone]

## 2021-05-20 NOTE — ED Triage Notes (Signed)
Pt to ED via POV with c/o palpitations, chest pain, headache and cough that came when the weather changed. You hear the congestion in pts voice. She is able to speak in complete clear sentences.

## 2021-05-20 NOTE — Discharge Instructions (Addendum)
You tested positive for COVID-19. You received a monoclonal antibody called Bebtelovimab, which can help prevent you from developing severe infection from COVID-19. Please follow-up with your transplant doctors or return to the emergency department if your symptoms are worsening.

## 2021-05-20 NOTE — ED Notes (Signed)
See triage note  presents with cough low grade temp and head ache

## 2021-05-21 NOTE — Unmapped (Signed)
Returned on call page from Dr. Augusto Gamble at [facility] ED.  Pt presented to ED with palpitations, chest pain, headache, low grad temp over the last a couple of days.  Patient COVID positive.  Dr. Sidney Ace requesting recommendations on treatment and plan of care. Dr. Toni Arthurs made aware and provided contact information for Dr. Sidney Ace.  Per Dr. Toni Arthurs, ED will try to give her monoclonal antibodies, if not available referral should be placed for COVID therapeutics tomorrow.  Per Dr. Toni Arthurs, mycophenalate will not be held as patients current symptoms do not warrant, but plan may change if symptoms worsen. Message routed to primary TNC.

## 2021-05-21 NOTE — Unmapped (Signed)
Called and spoke with pt on phone, she reports that she was able to get antibody infusion prior to leaving ER last night. She has cough, fatigue, fevers. Pt asked what she can take for her symptoms. Pt was advised that she can take Mucinex and Tylenol.   Pt was advised that she cannot come to appointment tomorrow and it will have to be rescheduled. She understands. Pt with no other needs at this time.

## 2021-06-04 ENCOUNTER — Inpatient Hospital Stay: Payer: Medicaid Other | Admitting: Family Medicine

## 2021-06-06 NOTE — Unmapped (Signed)
[facility] Refill Coordination Note    Specialty Medication(s) to be Shipped:   Transplant: mycophenolate mofetil 180mg  and tacrolimus 1mg     Other medication(s) to be shipped: No additional medications requested for fill at this time     Danaka Llera, DOB: 1966/02/20  Phone: [phone] (home)       All above HIPAA information was verified with patient.     Was a Nurse, learning disability used for this call? No    Completed refill call assessment today to schedule patient's medication shipment from the [facility] [phone]).  All relevant notes have been reviewed.     Specialty medication(s) and dose(s) confirmed: Regimen is correct and unchanged.   Changes to medications: Maryagnes reports no changes at this time.  Changes to insurance: No  New side effects reported not previously addressed with a pharmacist or physician: None reported  Questions for the pharmacist: No    Confirmed patient received a Conservation officer, historic buildings and a Surveyor, mining with first shipment. The patient will receive a drug information handout for each medication shipped and additional FDA Medication Guides as required.       DISEASE/MEDICATION-SPECIFIC INFORMATION        N/A    SPECIALTY MEDICATION ADHERENCE     Medication Adherence    Patient reported X missed doses in the last month: 0  Specialty Medication: Tacrolimus 1mg   Patient is on additional specialty medications: Yes  Additional Specialty Medications: Mycophenolate 180mg   Patient Reported Additional Medication X Missed Doses in the Last Month: 0  Patient is on more than two specialty medications: No  Adherence tools used: patient uses a pill box to manage medications        Were doses missed due to medication being on hold? No    Tacrolimus 1 mg: 8 days of medicine on hand   Mycophenolate 180 mg: 8 days of medicine on hand     REFERRAL TO PHARMACIST     Referral to the pharmacist: Not needed      [facility]     Shipping address confirmed in Epic.     Delivery Scheduled: Yes, Expected medication delivery date: 06/10/2021.     Medication will be delivered via UPS to the prescription address in Epic WAM.    Lorelei Pont [facility] Specialty Technician

## 2021-06-09 MED FILL — MYCOPHENOLATE SODIUM 180 MG TABLET,DELAYED RELEASE: ORAL | 30 days supply | Qty: 180 | Fill #5

## 2021-06-09 MED FILL — TACROLIMUS 1 MG CAPSULE, IMMEDIATE-RELEASE: ORAL | 30 days supply | Qty: 240 | Fill #5

## 2021-06-20 DIAGNOSIS — Z940 Kidney transplant status: Principal | ICD-10-CM

## 2021-06-20 DIAGNOSIS — G629 Polyneuropathy, unspecified: Principal | ICD-10-CM

## 2021-06-20 DIAGNOSIS — B009 Herpesviral infection, unspecified: Principal | ICD-10-CM

## 2021-06-20 MED ORDER — GABAPENTIN 300 MG CAPSULE
ORAL_CAPSULE | 0 refills | 0 days | Status: CP
Start: 2021-06-20 — End: ?

## 2021-06-20 MED ORDER — VALACYCLOVIR 500 MG TABLET
ORAL_TABLET | Freq: Every day | ORAL | 0 refills | 30.00000 days | Status: CP
Start: 2021-06-20 — End: ?

## 2021-07-08 NOTE — Unmapped (Signed)
The [facility] has made a third and final attempt to reach this patient to refill the following medication:mycophenolate, tacrolimus.      We have left voicemails on the following phone numbers: [phone]  and have sent a MyChart message.    Dates contacted: 12/1, 12/6, 12/12  Last scheduled delivery: 11/7    The patient may be at risk of non-compliance with this medication. The patient should call the [facility] at [phone]  Option 4, then Option 2 (all other specialty patients) to refill medication.    Thad Ranger   [facility] Specialty Pharmacist        The [facility] has made a second and final attempt to reach this patient to refill the following medication: tacrolimus, myfortic.      We have left voicemails on the following phone numbers: [phone].    Dates contacted: 07/03/21  07/08/21  Last scheduled delivery: 06/09/21    The patient may be at risk of non-compliance with this medication. The patient should call the [facility] at [phone]  Option 4, then Option 2 (all other specialty patients) to refill medication.    [place] A Sirus Labrie   [facility] Specialty Technician

## 2021-07-29 DIAGNOSIS — B009 Herpesviral infection, unspecified: Principal | ICD-10-CM

## 2021-07-29 DIAGNOSIS — G629 Polyneuropathy, unspecified: Principal | ICD-10-CM

## 2021-07-29 DIAGNOSIS — Z940 Kidney transplant status: Principal | ICD-10-CM

## 2021-07-29 DIAGNOSIS — I10 Essential (primary) hypertension: Principal | ICD-10-CM

## 2021-07-29 MED ORDER — CARVEDILOL 12.5 MG TABLET
ORAL_TABLET | Freq: Two times a day (BID) | ORAL | 0 refills | 30 days | Status: CP
Start: 2021-07-29 — End: 2022-07-30

## 2021-07-29 MED ORDER — VALACYCLOVIR 500 MG TABLET
ORAL_TABLET | Freq: Every day | ORAL | 0 refills | 30 days | Status: CP
Start: 2021-07-29 — End: ?

## 2021-07-29 MED ORDER — GABAPENTIN 300 MG CAPSULE
ORAL_CAPSULE | 0 refills | 0 days | Status: CP
Start: 2021-07-29 — End: ?

## 2021-07-29 MED ORDER — FLUTICASONE PROPIONATE 50 MCG/ACTUATION NASAL SPRAY,SUSPENSION
0 refills | 0.00000 days | Status: CP
Start: 2021-07-29 — End: ?

## 2021-08-05 MED ORDER — ONETOUCH ULTRA TEST STRIPS
ORAL_STRIP | 0 refills | 0 days | Status: CP
Start: 2021-08-05 — End: ?

## 2021-08-21 DIAGNOSIS — Z940 Kidney transplant status: Principal | ICD-10-CM

## 2021-08-21 MED ORDER — GLIPIZIDE 5 MG TABLET
ORAL_TABLET | Freq: Every day | ORAL | 0 refills | 0 days
Start: 2021-08-21 — End: ?

## 2021-08-22 DIAGNOSIS — Z940 Kidney transplant status: Principal | ICD-10-CM

## 2021-08-22 DIAGNOSIS — B009 Herpesviral infection, unspecified: Principal | ICD-10-CM

## 2021-08-22 DIAGNOSIS — I10 Essential (primary) hypertension: Principal | ICD-10-CM

## 2021-08-22 MED ORDER — LEVETIRACETAM 500 MG TABLET
ORAL_TABLET | 0 refills | 0 days
Start: 2021-08-22 — End: ?

## 2021-08-22 MED ORDER — TAMOXIFEN 20 MG TABLET
ORAL_TABLET | Freq: Every day | ORAL | 0 refills | 90 days | Status: CP
Start: 2021-08-22 — End: ?

## 2021-08-22 MED ORDER — CARVEDILOL 12.5 MG TABLET
ORAL_TABLET | Freq: Two times a day (BID) | ORAL | 0 refills | 0.00000 days
Start: 2021-08-22 — End: ?

## 2021-08-22 MED ORDER — GLIPIZIDE 5 MG TABLET
ORAL_TABLET | Freq: Every day | ORAL | 0 refills | 30 days | Status: CP
Start: 2021-08-22 — End: 2022-08-22

## 2021-08-22 MED ORDER — VALACYCLOVIR 500 MG TABLET
ORAL_TABLET | Freq: Every day | ORAL | 0 refills | 0 days
Start: 2021-08-22 — End: ?

## 2021-08-22 NOTE — Unmapped (Signed)
Patient need refill if appropriate.     Most recent clinic visit: Visit date not found  Next clinic visit:Visit date not found

## 2021-08-29 MED ORDER — CARVEDILOL 12.5 MG TABLET
ORAL_TABLET | Freq: Two times a day (BID) | ORAL | 0 refills | 30.00000 days | Status: CP
Start: 2021-08-29 — End: 2022-08-30

## 2021-08-29 MED ORDER — LEVETIRACETAM 500 MG TABLET
ORAL_TABLET | 0 refills | 0 days | Status: CP
Start: 2021-08-29 — End: ?

## 2021-08-29 MED ORDER — VALACYCLOVIR 500 MG TABLET
ORAL_TABLET | Freq: Every day | ORAL | 0 refills | 30.00000 days | Status: CP
Start: 2021-08-29 — End: ?

## 2021-09-05 ENCOUNTER — Other Ambulatory Visit: Payer: Self-pay | Admitting: Family Medicine

## 2021-09-05 DIAGNOSIS — Z940 Kidney transplant status: Principal | ICD-10-CM

## 2021-09-05 DIAGNOSIS — I10 Essential (primary) hypertension: Principal | ICD-10-CM

## 2021-09-05 MED ORDER — CARVEDILOL 12.5 MG TABLET
ORAL_TABLET | Freq: Two times a day (BID) | ORAL | 0 refills | 0 days
Start: 2021-09-05 — End: ?

## 2021-09-05 MED ORDER — ONETOUCH ULTRA TEST STRIPS
ORAL_STRIP | 0 refills | 0 days
Start: 2021-09-05 — End: ?

## 2021-09-05 NOTE — Telephone Encounter (Signed)
Requested medication (s) are due for refill today: yes  Requested medication (s) are on the active medication list: no  Last refill:  11/23/19  Future visit scheduled: no  Notes to clinic:  please advise.     Requested Prescriptions  Pending Prescriptions Disp Refills   Lancets (ONETOUCH DELICA PLUS [id number]) [place] [Pharmacy Med Name: ONETOUCH DELICA PLUS 56L LANCT] 100 each 0    Sig: USE TWICE DAILY AS DIRECTED.     Endocrinology: Diabetes - Testing Supplies Failed - 09/05/2021  4:58 PM      Failed - Valid encounter within last 12 months    Recent Outpatient Visits           2 years ago Upper respiratory tract infection, unspecified type   [facility] [place], [place]   2 years ago Upper respiratory tract infection, unspecified type   [facility], Lilia Argue, [place]   2 years ago Pain, dental   [place], [place], [place]   3 years ago Diverticulitis   [place], [place]   3 years ago Atypical chest pain   [place], [place], [place]

## 2021-09-08 NOTE — Telephone Encounter (Signed)
Attempted to call pt, I got the busy signal. Will try to call again at later time to schedule appt

## 2021-09-08 NOTE — Telephone Encounter (Signed)
Needs appt

## 2021-09-09 MED ORDER — ONETOUCH ULTRA TEST STRIPS
ORAL_STRIP | ORAL | 0 refills | 0.00000 days | Status: CP
Start: 2021-09-09 — End: ?

## 2021-09-09 MED ORDER — ONETOUCH DELICA PLUS LANCET 33 GAUGE
0 refills | 0.00000 days
Start: 2021-09-09 — End: ?

## 2021-09-09 MED ORDER — CARVEDILOL 12.5 MG TABLET
ORAL_TABLET | Freq: Two times a day (BID) | ORAL | 0 refills | 30 days | Status: CP
Start: 2021-09-09 — End: 2022-09-10

## 2021-09-10 MED ORDER — ONETOUCH DELICA PLUS LANCET 33 GAUGE
11 refills | 0 days | Status: CP
Start: 2021-09-10 — End: 2021-10-10

## 2021-09-10 NOTE — Telephone Encounter (Signed)
Attempted to call pt again, unable to leave vm

## 2021-09-12 NOTE — Telephone Encounter (Signed)
3rd attempt- Unable to leave vm to schedule appt. °

## 2021-09-16 MED FILL — MYCOPHENOLATE SODIUM 180 MG TABLET,DELAYED RELEASE: ORAL | 30 days supply | Qty: 180 | Fill #6

## 2021-09-16 MED FILL — TACROLIMUS 1 MG CAPSULE, IMMEDIATE-RELEASE: ORAL | 30 days supply | Qty: 240 | Fill #6

## 2021-09-16 NOTE — Unmapped (Signed)
[facility] Shared [facility] Clinical Assessment & Refill Coordination Note    Nancy Bowman, DOB: September 24, 1965  Phone: [phone] (home)     All above HIPAA information was verified with patient.     Was a Nurse, learning disability used for this call? 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 aspirin 81 MG chewable tablet Chew 1 tablet (81 mg total) daily. 30 tablet 11   ??? atorvastatin (LIPITOR) 40 MG tablet Take 1 tablet (40 mg total) by mouth daily. 30 tablet 11   ??? blood-glucose meter kit Use as instructed 1 each 0   ??? blood-glucose meter kit Use as instructed 1 each 0   ??? carvediloL (COREG) 12.5 MG tablet Take 1 tablet (12.5 mg total) by mouth Two (2) times a day. 60 tablet 0   ??? cetirizine (ZYRTEC) 5 MG chewable tablet Chew 1 tablet (5 mg total) daily. 30 tablet 11   ??? cholecalciferol, vitamin D3-25 mcg, 1,000 unit,, (VITAMIN D3) 25 mcg (1,000 unit) capsule Take 1 capsule (25 mcg total) by mouth daily. 30 capsule 11   ??? fluticasone propionate (FLONASE) 50 mcg/actuation nasal spray Two sprays each nare twice a day. 16 g 0   ??? gabapentin (NEURONTIN) 300 MG capsule Take 1 capsules (300 mg) by mouth in the morning and at noon, 2 CAPSULES (600 mg) in the evening. 360 capsule 0   ??? glipiZIDE (GLUCOTROL) 5 MG tablet Take 1 tablet (5 mg total) by mouth daily. 30 tablet 0   ??? levETIRAcetam (KEPPRA) 500 MG tablet Take 1 tablet (500 mg total) by mouth Two (2) times a day. 60 tablet 0   ??? levETIRAcetam (KEPPRA) 500 MG tablet Take 1 tablet (500 mg total) by mouth Two (2) times a day. 60 tablet 11   ??? levETIRAcetam (KEPPRA) 500 MG tablet Take 1 tablet (500 mg total) by mouth Two (2) times a day. 60 tablet 0   ??? magnesium oxide (MAG-OX) 400 mg (241.3 mg elemental magnesium) tablet Take 1 tablet (400 mg total) by mouth daily. 30 tablet 11   ??? miscellaneous medical supply Misc 1 knee brace 1 each 0 ??? mycophenolate (MYFORTIC) 180 MG EC tablet Take 2 tablets (360 mg total) by mouth Three (3) times a day. 180 tablet 11   ??? omeprazole (PRILOSEC) 20 MG capsule Take 1 capsule (20 mg total) by mouth daily. prn 30 capsule 11   ??? ONETOUCH DELICA PLUS LANCET 33 gauge Misc USE TWICE DAILY AS DIRECTED. 60 each 11   ??? ONETOUCH ULTRA TEST Strp by Other route Four (4) times a day (before meals and nightly). 200 strip 0   ??? sodium bicarbonate 650 mg tablet Take 2 tablets (1300 mg) three times a day. 180 tablet 11   ??? tacrolimus (PROGRAF) 1 MG capsule Take 4 capsules (4 mg total) by mouth two (2) times a day. 240 capsule 11   ??? tamoxifen (NOLVADEX) 20 MG tablet Take 1 tablet (20 mg total) by mouth daily. 90 tablet 0   ??? traMADoL (ULTRAM) 50 mg tablet Take 1 tablet (50 mg total) by mouth every eight (8) hours as needed for pain. 60 tablet 2   ??? valACYclovir (VALTREX) 500 MG tablet Take 1 tablet (500 mg total) by mouth daily. 30 tablet 0     No current facility-administered medications for this visit.  Changes to medications: Emeri reports no changes at this time.    Allergies   Allergen Reactions   ??? 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 Ibuprofen      Can't take due to kidneys   ??? Penicillins      Patient is unsure if she's allergic, was told she had a reaction as a child but thinks she's taking a cillin while sick without having a reaction.  Pt states she has taken amoxicillin in the past and did not have problem       Changes to allergies: No    SPECIALTY MEDICATION ADHERENCE     Tacrolimus 1 mg: 1 days of medicine on hand   Mycophenolate 180 mg: 1 days of medicine on hand       Medication Adherence    Patient reported X missed doses in the last month: 0  Specialty Medication: Mycophenolate 180mg   Patient is on additional specialty medications: Yes  Additional Specialty Medications: Tacrolimus 1mg   Patient Reported Additional Medication X Missed Doses in the Last Month: 0  Patient is on more than two specialty medications: No  Adherence tools used: patient uses a pill box to manage medications          Specialty medication(s) dose(s) confirmed: Regimen is correct and unchanged.     Are there any concerns with adherence? No    Adherence counseling provided? Not needed    CLINICAL MANAGEMENT AND INTERVENTION      Clinical Benefit Assessment:    Do you feel the medicine is effective or helping your condition? Yes    Clinical Benefit counseling provided? Not needed    Adverse Effects Assessment:    Are you experiencing any side effects? No    Are you experiencing difficulty administering your medicine? No    Quality of Life Assessment:         How many days over the past month did your kidney transplant  keep you from your normal activities? For example, brushing your teeth or getting up in the morning. 0    Have you discussed this with your provider? Not needed    Acute Infection Status:    Acute infections noted within Epic:  No active infections  Patient reported infection: None    Therapy Appropriateness:    Is therapy appropriate and patient progressing towards therapeutic goals? Yes, therapy is appropriate and should be continued    DISEASE/MEDICATION-SPECIFIC INFORMATION      N/A    PATIENT SPECIFIC NEEDS     - Does the patient have any physical, cognitive, or cultural barriers? No    - Is the patient high risk? Yes, patient is taking a REMS drug. Medication is dispensed in compliance with REMS program    - Does the patient require a Care Management Plan? No     SOCIAL DETERMINANTS OF HEALTH     At the [facility], we have learned that life circumstances - like trouble affording food, housing, utilities, or transportation can affect the health of many of our patients.   That is why we wanted to ask: are you currently experiencing any life circumstances that are negatively impacting your health and/or quality of life? No    Social Determinants of Health     Food Insecurity: Not on file   Tobacco Use: Not on file   Transportation Needs: Not on file   Alcohol Use: Not on file   Housing/Utilities: Not on  file   Substance Use: Not on file   Financial Resource Strain: Not on file   Physical Activity: Not on file   Health Literacy: Not on file   Stress: Not on file   Intimate Partner Violence: Not on file   Depression: Not on file   Social Connections: Not on file       Would you be willing to receive help with any of the needs that you have identified today? Not applicable       SHIPPING     Specialty Medication(s) to be Shipped:   Transplant:  mycophenolic acid 180mg  and tacrolimus 1mg     Other medication(s) to be shipped: No additional medications requested for fill at this time     Changes to insurance: No    Delivery Scheduled: Yes, Expected medication delivery date: 09/16/21.     Medication will be delivered via Same Day Courier to the confirmed prescription address in [facility].    The patient will receive a drug information handout for each medication shipped and additional FDA Medication Guides as required.  Verified that patient has previously received a Conservation officer, historic buildings and a Surveyor, mining.    The patient or caregiver noted above participated in the development of this care plan and knows that they can request review of or adjustments to the care plan at any time.      All of the patient's questions and concerns have been addressed.    [name]   [facility] Specialty Pharmacist

## 2021-10-08 MED ORDER — ONETOUCH ULTRA TEST STRIPS
ORAL_STRIP | 0 refills | 0 days
Start: 2021-10-08 — End: ?

## 2021-10-09 MED ORDER — ONETOUCH ULTRA TEST STRIPS
ORAL_STRIP | 0 refills | 0 days | Status: CP
Start: 2021-10-09 — End: ?

## 2021-10-16 NOTE — Unmapped (Signed)
[facility] Refill Coordination Note    Specialty Medication(s) to be Shipped:   Transplant: mycophenolate mofetil 180mg  and tacrolimus 1mg     Other medication(s) to be shipped: No additional medications requested for fill at this time     Nancy Bowman, DOB: February 02, 1966  Phone: [phone] (home) [phone] (work)      All above HIPAA information was verified with patient.     Was a Nurse, learning disability used for this call? No    Completed refill call assessment today to schedule patient's medication shipment from the [facility] [phone]).  All relevant notes have been reviewed.     Specialty medication(s) and dose(s) confirmed: Regimen is correct and unchanged.   Changes to medications: Nancy Bowman reports no changes at this time.  Changes to insurance: No  New side effects reported not previously addressed with a pharmacist or physician: None reported  Questions for the pharmacist: No    Confirmed patient received a Conservation officer, historic buildings and a Surveyor, mining with first shipment. The patient will receive a drug information handout for each medication shipped and additional FDA Medication Guides as required.       DISEASE/MEDICATION-SPECIFIC INFORMATION        N/A    SPECIALTY MEDICATION ADHERENCE     Medication Adherence    Patient reported X missed doses in the last month: 0  Specialty Medication: tacrolimus 1 MG capsule (PROGRAF)  Patient is on additional specialty medications: Yes  Additional Specialty Medications: mycophenolate 180 MG EC tablet (MYFORTIC)  Patient Reported Additional Medication X Missed Doses in the Last Month: 0  Patient is on more than two specialty medications: No  Any gaps in refill history greater than 2 weeks in the last 3 months: no  Demonstrates understanding of importance of adherence: yes  Informant: patient  Reliability of informant: reliable  Adherence tools used: patient uses a pill box to manage medications  Confirmed plan for next specialty medication refill: delivery by pharmacy  Refills needed for supportive medications: not needed              Were doses missed due to medication being on hold? No    mycophenolate 180 MG : 6 days of medicine on hand   tacrolimus 1 MG : 6 days of medicine on hand     REFERRAL TO PHARMACIST     Referral to the pharmacist: Not needed      [facility]     Shipping address confirmed in Epic.     Delivery Scheduled: Yes, Expected medication delivery date: 10/21/21.     Medication will be delivered via UPS to the prescription address in Epic WAM.    Nancy Bowman   [facility] Specialty Technician

## 2021-10-17 MED FILL — TACROLIMUS 1 MG CAPSULE, IMMEDIATE-RELEASE: ORAL | 30 days supply | Qty: 240 | Fill #7

## 2021-10-17 MED FILL — MYCOPHENOLATE SODIUM 180 MG TABLET,DELAYED RELEASE: ORAL | 30 days supply | Qty: 180 | Fill #7

## 2021-10-21 DIAGNOSIS — B009 Herpesviral infection, unspecified: Principal | ICD-10-CM

## 2021-10-21 DIAGNOSIS — I10 Essential (primary) hypertension: Principal | ICD-10-CM

## 2021-10-21 DIAGNOSIS — Z940 Kidney transplant status: Principal | ICD-10-CM

## 2021-10-21 MED ORDER — CARVEDILOL 12.5 MG TABLET
ORAL_TABLET | Freq: Two times a day (BID) | ORAL | 0 refills | 30 days | Status: CP
Start: 2021-10-21 — End: 2022-10-22

## 2021-10-21 MED ORDER — TAMOXIFEN 20 MG TABLET
ORAL_TABLET | Freq: Every day | ORAL | 0 refills | 90 days | Status: CP
Start: 2021-10-21 — End: ?

## 2021-10-21 MED ORDER — LEVETIRACETAM 500 MG TABLET
ORAL_TABLET | 0 refills | 0 days | Status: CP
Start: 2021-10-21 — End: ?

## 2021-10-21 MED ORDER — VALACYCLOVIR 500 MG TABLET
ORAL_TABLET | Freq: Every day | ORAL | 0 refills | 30 days | Status: CP
Start: 2021-10-21 — End: ?

## 2021-10-21 NOTE — Unmapped (Signed)
Please refill if appropriate

## 2021-11-06 DIAGNOSIS — E559 Vitamin D deficiency, unspecified: Principal | ICD-10-CM

## 2021-11-06 DIAGNOSIS — I10 Essential (primary) hypertension: Principal | ICD-10-CM

## 2021-11-06 DIAGNOSIS — Z79899 Other long term (current) drug therapy: Principal | ICD-10-CM

## 2021-11-06 DIAGNOSIS — K219 Gastro-esophageal reflux disease without esophagitis: Principal | ICD-10-CM

## 2021-11-06 DIAGNOSIS — B009 Herpesviral infection, unspecified: Principal | ICD-10-CM

## 2021-11-06 DIAGNOSIS — N186 End stage renal disease: Principal | ICD-10-CM

## 2021-11-06 DIAGNOSIS — Z940 Kidney transplant status: Principal | ICD-10-CM

## 2021-11-06 DIAGNOSIS — E872 Metabolic acidosis: Principal | ICD-10-CM

## 2021-11-06 DIAGNOSIS — G629 Polyneuropathy, unspecified: Principal | ICD-10-CM

## 2021-11-06 MED ORDER — GLIPIZIDE 5 MG TABLET
ORAL_TABLET | Freq: Every day | ORAL | 11 refills | 30 days | Status: CP
Start: 2021-11-06 — End: 2022-11-06

## 2021-11-06 MED ORDER — MAGNESIUM OXIDE 400 MG (241.3 MG MAGNESIUM) TABLET
ORAL_TABLET | Freq: Every day | ORAL | 11 refills | 30 days | Status: CP
Start: 2021-11-06 — End: ?

## 2021-11-06 MED ORDER — ATORVASTATIN 40 MG TABLET
ORAL_TABLET | Freq: Every day | ORAL | 11 refills | 30 days | Status: CP
Start: 2021-11-06 — End: 2022-11-06

## 2021-11-06 MED ORDER — OMEPRAZOLE 20 MG CAPSULE,DELAYED RELEASE
ORAL_CAPSULE | Freq: Every day | ORAL | 11 refills | 30.00000 days | Status: CP
Start: 2021-11-06 — End: ?

## 2021-11-06 MED ORDER — ONETOUCH ULTRA TEST STRIPS
ORAL_STRIP | Freq: Four times a day (QID) | 0 refills | 0.00000 days | Status: CP
Start: 2021-11-06 — End: 2021-12-06

## 2021-11-06 MED ORDER — ASPIRIN 81 MG CHEWABLE TABLET
ORAL_TABLET | Freq: Every day | ORAL | 11 refills | 30 days | Status: CP
Start: 2021-11-06 — End: 2022-11-06

## 2021-11-06 MED ORDER — CHOLECALCIFEROL (VITAMIN D3) 25 MCG (1,000 UNIT) CAPSULE
ORAL_CAPSULE | Freq: Every day | ORAL | 11 refills | 30 days | Status: CP
Start: 2021-11-06 — End: ?

## 2021-11-06 MED ORDER — FLUTICASONE PROPIONATE 50 MCG/ACTUATION NASAL SPRAY,SUSPENSION
3 refills | 0 days | Status: CP
Start: 2021-11-06 — End: ?

## 2021-11-06 MED ORDER — GABAPENTIN 300 MG CAPSULE
ORAL_CAPSULE | 2 refills | 0 days | Status: CP
Start: 2021-11-06 — End: ?

## 2021-11-06 MED ORDER — CARVEDILOL 12.5 MG TABLET
ORAL_TABLET | Freq: Two times a day (BID) | ORAL | 0 refills | 30 days | Status: CP
Start: 2021-11-06 — End: 2022-11-07

## 2021-11-06 MED ORDER — CETIRIZINE 5 MG CHEWABLE TABLET
ORAL_TABLET | Freq: Every day | ORAL | 11 refills | 30 days | Status: CP
Start: 2021-11-06 — End: ?

## 2021-11-06 MED ORDER — SODIUM BICARBONATE 650 MG TABLET
ORAL_TABLET | 11 refills | 0 days | Status: CP
Start: 2021-11-06 — End: ?

## 2021-11-06 MED ORDER — VALACYCLOVIR 500 MG TABLET
ORAL_TABLET | Freq: Every day | ORAL | 11 refills | 30 days | Status: CP
Start: 2021-11-06 — End: ?

## 2021-11-06 NOTE — Unmapped (Signed)
Pt called and asked for her meds to be refilled. This one done excluding Myfotic, prograf, and tramadol.  PT reports that she recently lost a family member and she wants to get back on track with her labs and visits but it will be after may.   Pt with no other needs at this time.

## 2021-11-24 NOTE — Unmapped (Signed)
The [facility] has made a second and final attempt to reach this patient to refill the following medication: tacrolimus, myfortic.      We have left voicemails on the following phone numbers: [phone] .    Dates contacted: 11/17/21  11/24/21  Last scheduled delivery: 10/17/21    The patient may be at risk of non-compliance with this medication. The patient should call the [facility] at [phone]  Option 4, then Option 2 (all other specialty patients) to refill medication.    [place] A Makila Colombe   [facility] Shared [facility] Specialty Technician

## 2021-11-24 NOTE — Unmapped (Signed)
[facility] Refill Coordination Note    Specialty Medication(s) to be Shipped:   Transplant: Myfortic 180mg  and tacrolimus 1mg     Other medication(s) to be shipped: No additional medications requested for fill at this time     Nancy Bowman, DOB: 20-Oct-1965  Phone: [phone] (home) [phone] (work)      All above HIPAA information was verified with patient.     Was a Nurse, learning disability used for this call? No    Completed refill call assessment today to schedule patient's medication shipment from the [facility] [phone]).  All relevant notes have been reviewed.     Specialty medication(s) and dose(s) confirmed: Regimen is correct and unchanged.   Changes to medications: [name] reports no changes at this time.  Changes to insurance: No  New side effects reported not previously addressed with a pharmacist or physician: None reported  Questions for the pharmacist: No    Confirmed patient received a Conservation officer, historic buildings and a Surveyor, mining with first shipment. The patient will receive a drug information handout for each medication shipped and additional FDA Medication Guides as required.       DISEASE/MEDICATION-SPECIFIC INFORMATION        N/A    SPECIALTY MEDICATION ADHERENCE     Medication Adherence    Patient reported X missed doses in the last month: 0  Specialty Medication: Mycophenolate  Patient is on additional specialty medications: Yes  Additional Specialty Medications: Tacrolimus  Patient is on more than two specialty medications: No  Any gaps in refill history greater than 2 weeks in the last 3 months: no  Demonstrates understanding of importance of adherence: yes  Informant: patient  Adherence tools used: patient uses a pill box to manage medications              Were doses missed due to medication being on hold? No    Myfortic 180mg : Patient has 5 days of medication on hand  Tacrolimus 1mg : Patient has 5 days of medication on hand     REFERRAL TO PHARMACIST     Referral to the pharmacist: Not needed      [facility]     Shipping address confirmed in Epic.     Delivery Scheduled: Yes, Expected medication delivery date: 4/27.     Medication will be delivered via UPS to the prescription address in Epic WAM.    Nancy Bowman   [facility] Specialty Technician

## 2021-11-26 MED FILL — TACROLIMUS 1 MG CAPSULE, IMMEDIATE-RELEASE: ORAL | 30 days supply | Qty: 240 | Fill #8

## 2021-11-26 MED FILL — MYCOPHENOLATE SODIUM 180 MG TABLET,DELAYED RELEASE: ORAL | 30 days supply | Qty: 180 | Fill #8

## 2021-12-17 NOTE — Unmapped (Signed)
[facility] Refill Coordination Note    Specialty Medication(s) to be Shipped:   Transplant:  mycophenolic acid 180mg  and tacrolimus 1mg     Other medication(s) to be shipped: No additional medications requested for fill at this time     Nancy Bowman, DOB: 09/23/1965  Phone: [phone] (home) [phone] (work)      All above HIPAA information was verified with patient.     Was a Nurse, learning disability used for this call? No    Completed refill call assessment today to schedule patient's medication shipment from the [facility] [phone]).  All relevant notes have been reviewed.     Specialty medication(s) and dose(s) confirmed: Regimen is correct and unchanged.   Changes to medications: Aven reports no changes at this time.  Changes to insurance: No  New side effects reported not previously addressed with a pharmacist or physician: None reported  Questions for the pharmacist: No    Confirmed patient received a Conservation officer, historic buildings and a Surveyor, mining with first shipment. The patient will receive a drug information handout for each medication shipped and additional FDA Medication Guides as required.       DISEASE/MEDICATION-SPECIFIC INFORMATION        N/A    SPECIALTY MEDICATION ADHERENCE     Medication Adherence    Patient reported X missed doses in the last month: 0  Specialty Medication: Mycophenolate 180mg   Patient is on additional specialty medications: Yes  Additional Specialty Medications: Tacrolimus 1mg   Patient Reported Additional Medication X Missed Doses in the Last Month: 0  Patient is on more than two specialty medications: No  Adherence tools used: patient uses a pill box to manage medications              Were doses missed due to medication being on hold? No    Mycophenolate 180 mg: 7 days of medicine on hand   Tacrolimus 1 mg: 7 days of medicine on hand     REFERRAL TO PHARMACIST     Referral to the pharmacist: Not needed      [facility]     Shipping address confirmed in Epic.     Delivery Scheduled: Yes, Expected medication delivery date: 12/23/21.     Medication will be delivered via UPS to the prescription address in Epic WAM.    [name]   [facility] Specialty Pharmacist

## 2021-12-22 MED ORDER — NITROGLYCERIN 0.4 MG SUBLINGUAL TABLET
ORAL_TABLET | 0 refills | 0 days
Start: 2021-12-22 — End: ?

## 2021-12-22 MED ORDER — LEVETIRACETAM 500 MG TABLET
ORAL_TABLET | 0 refills | 0 days
Start: 2021-12-22 — End: ?

## 2021-12-22 MED FILL — TACROLIMUS 1 MG CAPSULE, IMMEDIATE-RELEASE: ORAL | 30 days supply | Qty: 240 | Fill #9

## 2021-12-22 MED FILL — MYCOPHENOLATE SODIUM 180 MG TABLET,DELAYED RELEASE: ORAL | 30 days supply | Qty: 180 | Fill #9

## 2021-12-23 DIAGNOSIS — Z79899 Other long term (current) drug therapy: Principal | ICD-10-CM

## 2021-12-23 DIAGNOSIS — Z940 Kidney transplant status: Principal | ICD-10-CM

## 2021-12-23 MED ORDER — LEVETIRACETAM 500 MG TABLET
ORAL_TABLET | Freq: Two times a day (BID) | ORAL | 0 refills | 0.00000 days | Status: CP
Start: 2021-12-23 — End: ?

## 2021-12-23 MED ORDER — ONETOUCH ULTRA TEST STRIPS
ORAL_STRIP | Freq: Four times a day (QID) | 11 refills | 0 days | Status: CP
Start: 2021-12-23 — End: 2022-01-22

## 2021-12-23 MED ORDER — NITROGLYCERIN 0.4 MG SUBLINGUAL TABLET
ORAL_TABLET | SUBLINGUAL | 0 refills | 1.00000 days | Status: CP | PRN
Start: 2021-12-23 — End: 2022-01-22

## 2021-12-23 NOTE — Unmapped (Signed)
Pt called in requesting refills on Keppra, OneTouch Ultra blood glucose strips, and nitroglycerin tablets. Per Dr. Carlene Coria, it's ok to refill meds. Specifically for Keppra only refill for one month so she won't run out but pt will need to reach out to PCP or neurologist for further refills. Prescriptions sent to [facility] in [place] per pt. request.

## 2022-01-07 DIAGNOSIS — I10 Essential (primary) hypertension: Principal | ICD-10-CM

## 2022-01-07 DIAGNOSIS — Z79899 Other long term (current) drug therapy: Principal | ICD-10-CM

## 2022-01-07 DIAGNOSIS — Z940 Kidney transplant status: Principal | ICD-10-CM

## 2022-01-07 MED ORDER — CARVEDILOL 12.5 MG TABLET
ORAL_TABLET | 0 refills | 0 days
Start: 2022-01-07 — End: ?

## 2022-01-08 MED ORDER — CARVEDILOL 12.5 MG TABLET
ORAL_TABLET | 0 refills | 0 days | Status: CP
Start: 2022-01-08 — End: ?

## 2022-01-09 DIAGNOSIS — Z79899 Other long term (current) drug therapy: Principal | ICD-10-CM

## 2022-01-09 DIAGNOSIS — Z940 Kidney transplant status: Principal | ICD-10-CM

## 2022-01-09 MED ORDER — ONETOUCH ULTRA TEST STRIPS
ORAL_STRIP | Freq: Four times a day (QID) | 11 refills | 0 days | Status: CP
Start: 2022-01-09 — End: 2022-02-08

## 2022-01-09 NOTE — Unmapped (Signed)
Nancy Bowman has called and left message to report that her blood sugar testing strips prescription needs to be updated to 200 strips. Prescription resent. Pt does not have a phone so unable to call her at this time.

## 2022-01-18 DIAGNOSIS — Z940 Kidney transplant status: Principal | ICD-10-CM

## 2022-01-18 MED ORDER — MYCOPHENOLATE SODIUM 180 MG TABLET,DELAYED RELEASE
ORAL_TABLET | Freq: Three times a day (TID) | ORAL | 11 refills | 30 days
Start: 2022-01-18 — End: 2023-01-18

## 2022-01-18 MED ORDER — TACROLIMUS 1 MG CAPSULE, IMMEDIATE-RELEASE
ORAL_CAPSULE | Freq: Two times a day (BID) | ORAL | 11 refills | 30 days
Start: 2022-01-18 — End: 2023-01-18

## 2022-01-19 MED ORDER — MYCOPHENOLATE SODIUM 180 MG TABLET,DELAYED RELEASE
ORAL_TABLET | Freq: Three times a day (TID) | ORAL | 11 refills | 30 days | Status: CP
Start: 2022-01-19 — End: 2023-01-19
  Filled 2022-02-13: qty 180, 30d supply, fill #0

## 2022-01-19 MED ORDER — TACROLIMUS 1 MG CAPSULE, IMMEDIATE-RELEASE
ORAL_CAPSULE | Freq: Two times a day (BID) | ORAL | 11 refills | 30 days | Status: CP
Start: 2022-01-19 — End: 2023-01-19
  Filled 2022-02-13: qty 240, 30d supply, fill #0

## 2022-01-19 NOTE — Unmapped (Signed)
Pt request for RX Refill    mycophenolate (MYFORTIC) 180 MG EC tablet         Sig: Take 2 tablets (360 mg total) by mouth Three (3) times a day.    Disp: 180 tablet    Refills: 11    Start: 01/18/2022 - 01/18/2023    Class: Normal    For: Kidney transplant recipient    To pharmacy: Transplant Date: 03/24/2014 (Kidney)    Last ordered: 1 year ago by Brayton Caves, MD Last dispensed: 12/22/2021    Rx #: [id number]        tacrolimus (PROGRAF) 1 MG capsule         Sig: Take 4 capsules (4 mg total) by mouth two (2) times a day.    Disp: 240 capsule    Refills: 11    Start: 01/18/2022 - 01/18/2023    Class: Normal    For: Kidney transplant recipient    To pharmacy: Transplant Date: 03/24/2014 (Kidney)    Last ordered: 1 year ago by Brayton Caves, MD

## 2022-01-20 NOTE — Unmapped (Unsigned)
The [facility] has made a second and final attempt to reach this patient to refill the following medication:tacrolimus, myfortic.      We have left voicemails on the following phone numbers: [phone] .    Dates contacted:  01/13/22 01/20/22  Last scheduled delivery:  12/22/21    The patient may be at risk of non-compliance with this medication. The patient should call the [facility] at [phone]  Option 4, then Option 2 (all other specialty patients) to refill medication.    [place] A Ileah Falkenstein   [facility] Shared [facility] Specialty Technician

## 2022-01-22 NOTE — Unmapped (Signed)
The [facility] has made a third and final attempt to reach this patient to refill the following medication:mycophenolate, tacrolimus.      We have left voicemails on the following phone numbers: [phone] and have sent a MyChart message.    Dates contacted: 6/13, 6/20, 6/22  Last scheduled delivery: 12/22/21    The patient may be at risk of non-compliance with this medication. The patient should call the [facility] at [phone]  Option 4, then Option 2 (all other specialty patients) to refill medication.    [name]   [facility] Specialty Pharmacist

## 2022-01-28 NOTE — Unmapped (Signed)
Patient contacted regarding scheduling follow up appointment. No answer left voice message. January 28, 2022 1:36 PM

## 2022-02-13 DIAGNOSIS — I10 Essential (primary) hypertension: Principal | ICD-10-CM

## 2022-02-13 DIAGNOSIS — Z940 Kidney transplant status: Principal | ICD-10-CM

## 2022-02-13 DIAGNOSIS — Z79899 Other long term (current) drug therapy: Principal | ICD-10-CM

## 2022-02-13 MED ORDER — CARVEDILOL 12.5 MG TABLET
ORAL_TABLET | 0 refills | 0 days | Status: CP
Start: 2022-02-13 — End: ?

## 2022-02-13 MED ORDER — TAMOXIFEN 20 MG TABLET
ORAL_TABLET | Freq: Every day | ORAL | 0 refills | 90 days | Status: CP
Start: 2022-02-13 — End: ?

## 2022-02-13 NOTE — Unmapped (Signed)
Please refill if appropriate.    Most recent clinic visit: Visit date not found    Next clinic visit: Visit date not found

## 2022-02-13 NOTE — Unmapped (Addendum)
[facility] Refill Coordination Note    Specialty Medication(s) to be Shipped:   Transplant: mycophenolate mofetil 180mg  and tacrolimus 1mg     Other medication(s) to be shipped: No additional medications requested for fill at this time     Nancy Bowman, DOB: 07/31/1966  Phone: [phone] (home)       All above HIPAA information was verified with patient.     Was a Nurse, learning disability used for this call? No    Completed refill call assessment today to schedule patient's medication shipment from the [facility] [phone]).  All relevant notes have been reviewed.     Specialty medication(s) and dose(s) confirmed: Regimen is correct and unchanged.   Changes to medications: Nancy Bowman reports no changes at this time.  Changes to insurance: No  New side effects reported not previously addressed with a pharmacist or physician: None reported  Questions for the pharmacist: No    Confirmed patient received a Conservation officer, historic buildings and a Surveyor, mining with first shipment. The patient will receive a drug information handout for each medication shipped and additional FDA Medication Guides as required.       DISEASE/MEDICATION-SPECIFIC INFORMATION        N/A    SPECIALTY MEDICATION ADHERENCE     Medication Adherence    Patient reported X missed doses in the last month: 0  Specialty Medication: Mycophenolate 180mg   Patient is on additional specialty medications: Yes  Additional Specialty Medications: Tacrolimus 1mg   Patient Reported Additional Medication X Missed Doses in the Last Month: 0  Patient is on more than two specialty medications: No  Any gaps in refill history greater than 2 weeks in the last 3 months: no  Demonstrates understanding of importance of adherence: yes  Informant: patient  Reliability of informant: reliable  Provider-estimated medication adherence level: good  Patient is at risk for Non-Adherence: No  Reasons for non-adherence: no problems identified  Adherence tools used: patient uses a pill box to manage medications  Confirmed plan for next specialty medication refill: delivery by pharmacy  Refills needed for supportive medications: not needed          Refill Coordination    Has the Patients' Contact Information Changed: No  Is the Shipping Address Different: No         Were doses missed due to medication being on hold? No    mycophenolate 180 mg: 2 days of medicine on hand   tacrolimus 1 mg: 2 days of medicine on hand       REFERRAL TO PHARMACIST     Referral to the pharmacist: Not needed      [facility]     Shipping address confirmed in Epic.     Delivery Scheduled: Yes, Expected medication delivery date: 07/15. -Saturday delivery     Medication will be delivered via Next Day Courier to the prescription address in Epic WAM.    Antonietta Barcelona   [facility] Shared [facility] Specialty Technician

## 2022-02-13 NOTE — Unmapped (Signed)
Rx refill request came in through the [facility] Clinical Pool

## 2022-03-16 NOTE — Unmapped (Signed)
[facility] Clinical Assessment & Refill Coordination Note    Nancy Bowman, DOB: 1965/10/24  Phone: [phone] (home)     All above HIPAA information was verified with patient.     Was a Nurse, learning disability used for this call? 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 aspirin 81 MG chewable tablet Chew 1 tablet (81 mg total) daily. 30 tablet 11   ??? atorvastatin (LIPITOR) 40 MG tablet Take 1 tablet (40 mg total) by mouth daily. 30 tablet 11   ??? blood sugar diagnostic (ONETOUCH ULTRA TEST) Strp by Other route four (4) times a day. 200 strip 11   ??? blood-glucose meter kit Use as instructed 1 each 0   ??? carvediloL (COREG) 12.5 MG tablet Take 1 tablet (12.5 mg total) by mouth Two (2) times a day. 60 tablet 0   ??? cetirizine (ZYRTEC) 5 MG chewable tablet Chew 1 tablet (5 mg total) daily. 30 tablet 11   ??? cholecalciferol, vitamin D3-25 mcg, 1,000 unit,, (VITAMIN D3) 25 mcg (1,000 unit) capsule Take 1 capsule (25 mcg total) by mouth daily. 30 capsule 11   ??? fluticasone propionate (FLONASE) 50 mcg/actuation nasal spray Two sprays each nare twice a day 16 g 3   ??? gabapentin (NEURONTIN) 300 MG capsule Take 1 capsule (300 mg ) by mouth in the morning and at noon, 2 capsules (600 mg) in the evening 360 capsule 2   ??? glipiZIDE (GLUCOTROL) 5 MG tablet Take 1 tablet (5 mg total) by mouth daily. 30 tablet 11   ??? levETIRAcetam (KEPPRA) 500 MG tablet Take 1 tablet (500 mg total) by mouth Two (2) times a day. 60 tablet 11   ??? levETIRAcetam (KEPPRA) 500 MG tablet Take 1 tablet (500 mg total) by mouth Two (2) times a day. 60 tablet 0   ??? magnesium oxide (MAG-OX) 400 mg (241.3 mg elemental magnesium) tablet Take 1 tablet (400 mg total) by mouth daily. 30 tablet 11   ??? miscellaneous medical supply Misc 1 knee brace 1 each 0   ??? mycophenolate (MYFORTIC) 180 MG EC tablet Take 2 tablets (360 mg total) by mouth Three (3) times a day. 180 tablet 11   ??? omeprazole (PRILOSEC) 20 MG capsule Take 1 capsule (20 mg total) by mouth daily. prn 30 capsule 11   ??? sodium bicarbonate 650 mg tablet Take 2 tablets (1300 mg) three times a day. 180 tablet 11   ??? tacrolimus (PROGRAF) 1 MG capsule Take 4 capsules (4 mg total) by mouth two (2) times a day. 240 capsule 11   ??? tamoxifen (NOLVADEX) 20 MG tablet Take 1 tablet (20 mg total) by mouth daily. 90 tablet 0   ??? traMADoL (ULTRAM) 50 mg tablet Take 1 tablet (50 mg total) by mouth every eight (8) hours as needed for pain. 60 tablet 2   ??? valACYclovir (VALTREX) 500 MG tablet Take 1 tablet (500 mg total) by mouth daily. 30 tablet 11     No current facility-administered medications for this visit.        Changes to medications: Nancy Bowman reports no changes at this time.    Allergies   Allergen Reactions   ??? 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 Ibuprofen      Can't take due to kidneys   ???  Penicillins      Patient is unsure if she's allergic, was told she had a reaction as a child but thinks she's taking a cillin while sick without having a reaction.  Pt states she has taken amoxicillin in the past and did not have problem       Changes to allergies: No    SPECIALTY MEDICATION ADHERENCE     Mycophenolate 180mg   : 5 days of medicine on hand   Tacrolimus 1mg   : 5 days of medicine on hand       Medication Adherence    Patient reported X missed doses in the last month: 0  Specialty Medication: mycophenolate 180mg   Patient is on additional specialty medications: Yes  Additional Specialty Medications: Tacrolimus 1mg   Patient Reported Additional Medication X Missed Doses in the Last Month: 0      Adherence tools used: patient uses a pill box to manage medications                  Specialty medication(s) dose(s) confirmed: Regimen is correct and unchanged.     Are there any concerns with adherence? No    Adherence counseling provided? Not needed    CLINICAL MANAGEMENT AND INTERVENTION      Clinical Benefit Assessment:    Do you feel the medicine is effective or helping your condition? Yes    Clinical Benefit counseling provided? Not needed    Adverse Effects Assessment:    Are you experiencing any side effects? No    Are you experiencing difficulty administering your medicine? No    Quality of Life Assessment:    How many days over the past month did your transplant  keep you from your normal activities? For example, brushing your teeth or getting up in the morning. 0    Have you discussed this with your provider? Not needed    Acute Infection Status:    Acute infections noted within Epic:  No active infections  Patient reported infection: None    Therapy Appropriateness:    Is therapy appropriate and patient progressing towards therapeutic goals? Yes, therapy is appropriate and should be continued    DISEASE/MEDICATION-SPECIFIC INFORMATION      N/A    PATIENT SPECIFIC NEEDS     - Does the patient have any physical, cognitive, or cultural barriers? No    - Is the patient high risk? Yes, patient is taking a REMS drug. Medication is dispensed in compliance with REMS program    - Does the patient require a Care Management Plan? No     SOCIAL DETERMINANTS OF HEALTH     At the [facility], we have learned that life circumstances - like trouble affording food, housing, utilities, or transportation can affect the health of many of our patients.   That is why we wanted to ask: are you currently experiencing any life circumstances that are negatively impacting your health and/or quality of life? No    Social Determinants of Health     Financial Resource Strain: Not on file   Internet Connectivity: Not on file   Food Insecurity: Not on file   Tobacco Use: Medium Risk (07/11/2020)    Patient History    ??? Smoking Tobacco Use: Former    ??? Smokeless Tobacco Use: Never    ??? Passive Exposure: Not on file   Housing/Utilities: Not on file   Alcohol Use: Not on file   Transportation Needs: Not on file   Substance Use: Not  on file   Health Literacy: Not on file   Physical Activity: Not on file   Interpersonal Safety: Not on file   Stress: Not on file   Intimate Partner Violence: Not on file   Depression: Not on file   Social Connections: Not on file       Would you be willing to receive help with any of the needs that you have identified today? 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     Medication will be delivered via UPS to the confirmed prescription address in [facility].    The patient will receive a drug information handout for each medication shipped and additional FDA Medication Guides as required.  Verified that patient has previously received a Conservation officer, historic buildings and a Surveyor, mining.    The patient or caregiver noted above participated in the development of this care plan and knows that they can request review of or adjustments to the care plan at any time.      All of the patient's questions and concerns have been addressed.    Thad Ranger   [facility] Specialty Pharmacist

## 2022-03-17 MED FILL — MYCOPHENOLATE SODIUM 180 MG TABLET,DELAYED RELEASE: ORAL | 30 days supply | Qty: 180 | Fill #1

## 2022-03-17 MED FILL — TACROLIMUS 1 MG CAPSULE, IMMEDIATE-RELEASE: ORAL | 30 days supply | Qty: 240 | Fill #1

## 2022-03-19 MED ORDER — LEVETIRACETAM 500 MG TABLET
ORAL_TABLET | Freq: Two times a day (BID) | ORAL | 0 refills | 0 days
Start: 2022-03-19 — End: ?

## 2022-03-20 MED ORDER — LEVETIRACETAM 500 MG TABLET
ORAL_TABLET | Freq: Two times a day (BID) | ORAL | 0 refills | 30 days | Status: CP
Start: 2022-03-20 — End: ?

## 2022-03-20 NOTE — Unmapped (Signed)
The patient is requesting a medication refill

## 2022-03-26 DIAGNOSIS — Z940 Kidney transplant status: Principal | ICD-10-CM

## 2022-03-26 NOTE — Unmapped (Signed)
Appointment letter mailed

## 2022-03-26 NOTE — Unmapped (Signed)
Spoke with Nancy Bowman, she reports that she is ready to start coming to clinic and be seen by Nancy Bowman. She states that she was going through depression for some time but would like to get back on track.  Pt asked where [facility] could send lab orders, she states that she doesn't have gas money and will only do labs when she comes to [facility] for a clinic visit.   Pt also asks if she can have all appointments on the same day- she was asking if she could see her PCP same day. Pt PT was advised that we cannot schedule with her PCP- she states understanding.     Appointment request sent to scheduler for RUS, CXR and appt with Nancy Bowman.

## 2022-03-30 NOTE — Unmapped (Signed)
[employer] forms

## 2022-04-10 NOTE — Unmapped (Signed)
[facility] Refill Coordination Note    Specialty Medication(s) to be Shipped:   Transplant: Myfortic 180mg  and tacrolimus 1mg     Other medication(s) to be shipped: No additional medications requested for fill at this time     Nancy Bowman, DOB: 1966-03-07  Phone: [phone] (home) [phone] (work)      All above HIPAA information was verified with patient.     Was a Nurse, learning disability used for this call? No    Completed refill call assessment today to schedule patient's medication shipment from the [facility] [phone]).  All relevant notes have been reviewed.     Specialty medication(s) and dose(s) confirmed: Regimen is correct and unchanged.   Changes to medications: Nancy Bowman reports no changes at this time.  Changes to insurance: No  New side effects reported not previously addressed with a pharmacist or physician: None reported  Questions for the pharmacist: No    Confirmed patient received a Conservation officer, historic buildings and a Surveyor, mining with first shipment. The patient will receive a drug information handout for each medication shipped and additional FDA Medication Guides as required.       DISEASE/MEDICATION-SPECIFIC INFORMATION        N/A    SPECIALTY MEDICATION ADHERENCE     Medication Adherence    Patient reported X missed doses in the last month: 0  Specialty Medication: mycophenolate 180 mg  Patient is on additional specialty medications: Yes  Additional Specialty Medications: Tacrolimus 1 mg   Patient Reported Additional Medication X Missed Doses in the Last Month: 0  Patient is on more than two specialty medications: No  Any gaps in refill history greater than 2 weeks in the last 3 months: no  Demonstrates understanding of importance of adherence: yes  Informant: patient      Adherence tools used: patient uses a pill box to manage medications                      Were doses missed due to medication being on hold? No    Tacrolimus 1mg : Patient has 10 days of medication on hand  Myfortic 180mg : Patient has 10 days of medication on hand    REFERRAL TO PHARMACIST     Referral to the pharmacist: Not needed      [facility]     Shipping address confirmed in Epic.     Delivery Scheduled: Yes, Expected medication delivery date: 9/15.     Medication will be delivered via Next Day Courier to the prescription address in Epic WAM.    Nancy Bowman   [facility] Specialty Technician

## 2022-04-16 MED FILL — TACROLIMUS 1 MG CAPSULE, IMMEDIATE-RELEASE: ORAL | 30 days supply | Qty: 240 | Fill #2

## 2022-04-16 MED FILL — MYCOPHENOLATE SODIUM 180 MG TABLET,DELAYED RELEASE: ORAL | 30 days supply | Qty: 180 | Fill #2

## 2022-05-08 DIAGNOSIS — Z79899 Other long term (current) drug therapy: Principal | ICD-10-CM

## 2022-05-08 DIAGNOSIS — Z940 Kidney transplant status: Principal | ICD-10-CM

## 2022-05-08 DIAGNOSIS — I10 Essential (primary) hypertension: Principal | ICD-10-CM

## 2022-05-08 MED ORDER — LEVETIRACETAM 500 MG TABLET
ORAL_TABLET | Freq: Two times a day (BID) | ORAL | 0 refills | 30 days | Status: CP
Start: 2022-05-08 — End: ?

## 2022-05-08 MED ORDER — TAMOXIFEN 20 MG TABLET
ORAL_TABLET | Freq: Every day | ORAL | 0 refills | 0 days
Start: 2022-05-08 — End: ?

## 2022-05-08 MED ORDER — CARVEDILOL 12.5 MG TABLET
ORAL_TABLET | 0 refills | 0 days | Status: CP
Start: 2022-05-08 — End: ?

## 2022-05-08 NOTE — Unmapped (Signed)
Please refill if appropriate.    Most recent clinic visit: Visit date not found    Next clinic visit: Visit date not found

## 2022-05-11 MED ORDER — TAMOXIFEN 20 MG TABLET
ORAL_TABLET | Freq: Every day | ORAL | 0 refills | 90.00000 days
Start: 2022-05-11 — End: ?

## 2022-05-11 NOTE — Unmapped (Signed)
Pt has completed 5 years of therapy.  No longer needs this medication .

## 2022-05-12 DIAGNOSIS — N186 End stage renal disease: Principal | ICD-10-CM

## 2022-05-12 DIAGNOSIS — E559 Vitamin D deficiency, unspecified: Principal | ICD-10-CM

## 2022-05-12 DIAGNOSIS — D849 Immunodeficiency, unspecified: Principal | ICD-10-CM

## 2022-05-12 DIAGNOSIS — R799 Abnormal finding of blood chemistry, unspecified: Principal | ICD-10-CM

## 2022-05-12 DIAGNOSIS — Z940 Kidney transplant status: Principal | ICD-10-CM

## 2022-05-15 ENCOUNTER — Ambulatory Visit: Admit: 2022-05-15 | Payer: MEDICARE

## 2022-05-15 ENCOUNTER — Ambulatory Visit: Admit: 2022-05-15 | Payer: MEDICARE | Attending: Nephrology | Primary: Nephrology

## 2022-05-18 NOTE — Unmapped (Signed)
Called patient to reschedule annual appointment. No answer left voice message.

## 2022-05-27 NOTE — Unmapped (Signed)
The [facility] has made a third and final attempt to reach this patient to refill the following medication:mycophenolate, tacrolimus.      We have left voicemails on the following phone numbers: [phone] and have sent a MyChart message.    Dates contacted: 10/6, 10/10, 10/25  Last scheduled delivery: 04/16/22    The patient may be at risk of non-compliance with this medication. The patient should call the [facility] at [phone]  Option 4, then Option 2 (all other specialty patients) to refill medication.    [name], [facility]   [facility] Specialty Pharmacist

## 2022-05-29 NOTE — Unmapped (Signed)
Called patient to reschedule annual appointment. No answer left voice message. Unable to contact letter was also mailed to patient.

## 2022-06-03 NOTE — Unmapped (Signed)
[facility] Refill Coordination Note    Specialty Medication(s) to be Shipped:   Transplant: Myfortic 180mg  and tacrolimus 1mg     Other medication(s) to be shipped: No additional medications requested for fill at this time     Nancy Bowman, DOB: 1965/12/29  Phone: [phone] (home)       All above HIPAA information was verified with patient.     Was a Nurse, learning disability used for this call? No    Completed refill call assessment today to schedule patient's medication shipment from the [facility] [phone]).  All relevant notes have been reviewed.     Specialty medication(s) and dose(s) confirmed: Regimen is correct and unchanged.   Changes to medications: Nancy Bowman reports no changes at this time.  Changes to insurance: No  New side effects reported not previously addressed with a pharmacist or physician: None reported  Questions for the pharmacist: No    Confirmed patient received a Conservation officer, historic buildings and a Surveyor, mining with first shipment. The patient will receive a drug information handout for each medication shipped and additional FDA Medication Guides as required.       DISEASE/MEDICATION-SPECIFIC INFORMATION        N/A    SPECIALTY MEDICATION ADHERENCE     Medication Adherence    Patient reported X missed doses in the last month: 0  Specialty Medication: tacrolimus 1 MG capsule (PROGRAF)  Patient is on additional specialty medications: Yes  Additional Specialty Medications: mycophenolate 180 MG EC tablet (MYFORTIC)  Patient Reported Additional Medication X Missed Doses in the Last Month: 0  Patient is on more than two specialty medications: No      Adherence tools used: patient uses a pill box to manage medications                          Were doses missed due to medication being on hold? No    Tacrolimus 1mg : Patient has 1 days of medication on hand  Myfortic 180mg : Patient has 1 days of medication on hand    REFERRAL TO PHARMACIST     Referral to the pharmacist: Not needed      [facility]     Shipping address confirmed in Epic.     Delivery Scheduled: Yes, Expected medication delivery date: 06/04/22.     Medication will be delivered via Same Day Courier to the prescription address in Epic WAM.    Nancy Bowman   [facility] Specialty Technician

## 2022-06-04 MED FILL — TACROLIMUS 1 MG CAPSULE, IMMEDIATE-RELEASE: ORAL | 30 days supply | Qty: 240 | Fill #3

## 2022-06-04 MED FILL — MYCOPHENOLATE SODIUM 180 MG TABLET,DELAYED RELEASE: ORAL | 30 days supply | Qty: 180 | Fill #3

## 2022-06-04 NOTE — Unmapped (Signed)
Appointment letter mailed

## 2022-06-04 NOTE — Unmapped (Signed)
Spoke with Clarivel today, she says she never got a letter or a phone call regarding her last appt and that is why she missed it. PT also reports that her phone has been broken and she thinks her mailman lost her mail.  PT states that she would like to come in for an appt with Dr. Carlene Coria sometime. Pt was told that scheduler would reach out to her to set up another appt. She said to call on the following number- [phone]   Pt reports that she has been feeling ok recently, she has had some bouts depression since her grandmother died 5 years ago.

## 2022-06-10 NOTE — Unmapped (Signed)
2nd appointment letter mailed per patient request.

## 2022-06-13 DIAGNOSIS — Z79899 Other long term (current) drug therapy: Principal | ICD-10-CM

## 2022-06-13 DIAGNOSIS — Z940 Kidney transplant status: Principal | ICD-10-CM

## 2022-06-13 DIAGNOSIS — G40909 Epilepsy, unspecified, not intractable, without status epilepticus: Principal | ICD-10-CM

## 2022-06-13 DIAGNOSIS — K219 Gastro-esophageal reflux disease without esophagitis: Principal | ICD-10-CM

## 2022-06-13 DIAGNOSIS — G629 Polyneuropathy, unspecified: Principal | ICD-10-CM

## 2022-06-13 DIAGNOSIS — E872 Metabolic acidosis: Principal | ICD-10-CM

## 2022-06-13 DIAGNOSIS — I10 Essential (primary) hypertension: Principal | ICD-10-CM

## 2022-06-13 DIAGNOSIS — N186 End stage renal disease: Principal | ICD-10-CM

## 2022-06-13 DIAGNOSIS — B009 Herpesviral infection, unspecified: Principal | ICD-10-CM

## 2022-06-13 MED ORDER — LEVETIRACETAM 500 MG TABLET
ORAL_TABLET | Freq: Two times a day (BID) | ORAL | 0 refills | 30 days | Status: CP
Start: 2022-06-13 — End: ?

## 2022-06-13 MED ORDER — OMEPRAZOLE 20 MG CAPSULE,DELAYED RELEASE
ORAL_CAPSULE | Freq: Every day | ORAL | 11 refills | 30 days | Status: CP
Start: 2022-06-13 — End: ?

## 2022-06-13 MED ORDER — SODIUM BICARBONATE 650 MG TABLET
ORAL_TABLET | 11 refills | 0 days | Status: CP
Start: 2022-06-13 — End: ?

## 2022-06-13 MED ORDER — GABAPENTIN 300 MG CAPSULE
ORAL_CAPSULE | 2 refills | 0 days | Status: CP
Start: 2022-06-13 — End: ?

## 2022-06-13 MED ORDER — ATORVASTATIN 40 MG TABLET
ORAL_TABLET | Freq: Every day | ORAL | 11 refills | 30 days | Status: CP
Start: 2022-06-13 — End: 2023-06-13

## 2022-06-13 MED ORDER — GLIPIZIDE 5 MG TABLET
ORAL_TABLET | Freq: Every day | ORAL | 11 refills | 30 days | Status: CP
Start: 2022-06-13 — End: 2023-06-13

## 2022-06-13 MED ORDER — FLUTICASONE PROPIONATE 50 MCG/ACTUATION NASAL SPRAY,SUSPENSION
3 refills | 0 days | Status: CP
Start: 2022-06-13 — End: ?

## 2022-06-13 MED ORDER — VALACYCLOVIR 500 MG TABLET
ORAL_TABLET | Freq: Every day | ORAL | 11 refills | 30 days | Status: CP
Start: 2022-06-13 — End: ?

## 2022-06-13 MED ORDER — TAMOXIFEN 20 MG TABLET
ORAL_TABLET | Freq: Every day | ORAL | 0 refills | 0 days
Start: 2022-06-13 — End: ?

## 2022-06-13 MED ORDER — CARVEDILOL 12.5 MG TABLET
ORAL_TABLET | Freq: Two times a day (BID) | ORAL | 11 refills | 30.00000 days | Status: CP
Start: 2022-06-13 — End: ?

## 2022-06-13 NOTE — Unmapped (Signed)
Pt called on call nurse needing some prescriptions sent to her local pharmacy, all sent in as requested.

## 2022-06-14 MED ORDER — LEVETIRACETAM 500 MG TABLET
ORAL_TABLET | Freq: Two times a day (BID) | ORAL | 0 refills | 30 days
Start: 2022-06-14 — End: ?

## 2022-06-14 NOTE — Unmapped (Signed)
Already sent.

## 2022-06-15 MED ORDER — TAMOXIFEN 20 MG TABLET
ORAL_TABLET | Freq: Every day | ORAL | 0 refills | 90 days
Start: 2022-06-15 — End: ?

## 2022-07-01 MED FILL — TACROLIMUS 1 MG CAPSULE, IMMEDIATE-RELEASE: ORAL | 30 days supply | Qty: 240 | Fill #4

## 2022-07-01 MED FILL — MYCOPHENOLATE SODIUM 180 MG TABLET,DELAYED RELEASE: ORAL | 30 days supply | Qty: 180 | Fill #4

## 2022-07-01 NOTE — Unmapped (Signed)
[facility] Refill Coordination Note    Specialty Medication(s) to be Shipped:   Transplant: Myfortic 180mg  and tacrolimus 1mg     Other medication(s) to be shipped: No additional medications requested for fill at this time     Nancy Bowman, DOB: 1966/01/02  Phone: [phone] (home)       All above HIPAA information was verified with patient.     Was a Nurse, learning disability used for this call? No    Completed refill call assessment today to schedule patient's medication shipment from the [facility] [phone]).  All relevant notes have been reviewed.     Specialty medication(s) and dose(s) confirmed: Regimen is correct and unchanged.   Changes to medications: Nancy Bowman reports no changes at this time.  Changes to insurance: No  New side effects reported not previously addressed with a pharmacist or physician: None reported  Questions for the pharmacist: No    Confirmed patient received a Conservation officer, historic buildings and a Surveyor, mining with first shipment. The patient will receive a drug information handout for each medication shipped and additional FDA Medication Guides as required.       DISEASE/MEDICATION-SPECIFIC INFORMATION        N/A    SPECIALTY MEDICATION ADHERENCE     Medication Adherence    Patient reported X missed doses in the last month: 0  Specialty Medication: mycophenolate 180 MG EC tablet (MYFORTIC)  Patient is on additional specialty medications: Yes  Additional Specialty Medications: tacrolimus 1 MG capsule (PROGRAF)  Patient Reported Additional Medication X Missed Doses in the Last Month: 0  Patient is on more than two specialty medications: No      Adherence tools used: patient uses a pill box to manage medications                          Were doses missed due to medication being on hold? No    Tacrolimus 1mg : Patient has 1 days of medication on hand  Myfortic 180mg : Patient has 1 days of medication on hand    REFERRAL TO PHARMACIST     Referral to the pharmacist: Not needed      [facility]     Shipping address confirmed in Epic.     Delivery Scheduled: Yes, Expected medication delivery date: 07/02/22.     Medication will be delivered via Same Day Courier to the prescription address in Epic WAM.    Nancy Bowman   [facility] Specialty Technician

## 2022-07-14 ENCOUNTER — Encounter: Payer: Self-pay | Admitting: Licensed Clinical Social Worker

## 2022-07-14 DIAGNOSIS — D0512 Intraductal carcinoma in situ of left breast: Secondary | ICD-10-CM
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
## 2022-07-20 DIAGNOSIS — Z940 Kidney transplant status: Principal | ICD-10-CM

## 2022-07-20 DIAGNOSIS — R799 Abnormal finding of blood chemistry, unspecified: Principal | ICD-10-CM

## 2022-07-20 DIAGNOSIS — N186 End stage renal disease: Principal | ICD-10-CM

## 2022-07-20 DIAGNOSIS — E559 Vitamin D deficiency, unspecified: Principal | ICD-10-CM

## 2022-07-22 ENCOUNTER — Ambulatory Visit: Admit: 2022-07-22 | Discharge: 2022-07-23 | Payer: MEDICARE

## 2022-07-22 ENCOUNTER — Ambulatory Visit: Admit: 2022-07-22 | Discharge: 2022-07-23 | Payer: MEDICARE | Attending: Nephrology | Primary: Nephrology

## 2022-07-22 DIAGNOSIS — Z940 Kidney transplant status: Principal | ICD-10-CM

## 2022-07-22 DIAGNOSIS — E139 Other specified diabetes mellitus without complications: Principal | ICD-10-CM

## 2022-07-22 DIAGNOSIS — N6489 Other specified disorders of breast: Principal | ICD-10-CM

## 2022-07-22 DIAGNOSIS — M25551 Pain in right hip: Principal | ICD-10-CM

## 2022-07-22 DIAGNOSIS — R202 Paresthesia of skin: Principal | ICD-10-CM

## 2022-07-22 DIAGNOSIS — N186 End stage renal disease: Principal | ICD-10-CM

## 2022-07-22 DIAGNOSIS — R799 Abnormal finding of blood chemistry, unspecified: Principal | ICD-10-CM

## 2022-07-22 DIAGNOSIS — E559 Vitamin D deficiency, unspecified: Principal | ICD-10-CM

## 2022-07-22 DIAGNOSIS — Z794 Long term (current) use of insulin: Principal | ICD-10-CM

## 2022-07-22 DIAGNOSIS — M25552 Pain in left hip: Principal | ICD-10-CM

## 2022-07-22 DIAGNOSIS — Z23 Encounter for immunization: Secondary | ICD-10-CM | POA: Diagnosis not present

## 2022-07-22 LAB — URINALYSIS WITH MICROSCOPY
BILIRUBIN UA: NEGATIVE
GLUCOSE UA: NEGATIVE
KETONES UA: NEGATIVE
LEUKOCYTE ESTERASE UA: NEGATIVE
NITRITE UA: NEGATIVE
PH UA: 6 (ref 5.0–9.0)
PROTEIN UA: 30 — AB
RBC UA: 1 /HPF (ref <4)
SPECIFIC GRAVITY UA: 1.02 (ref 1.005–1.030)
SQUAMOUS EPITHELIAL: 3 /HPF (ref 0–5)
UROBILINOGEN UA: 0.2
WBC UA: 5 /HPF (ref 0–5)

## 2022-07-22 LAB — BK VIRUS QUANTITATIVE PCR, BLOOD: BK BLOOD RESULT: NOT DETECTED

## 2022-07-22 LAB — BASIC METABOLIC PANEL
ANION GAP: 8 mmol/L (ref 5–14)
BLOOD UREA NITROGEN: 37 mg/dL — ABNORMAL HIGH (ref 9–23)
BUN / CREAT RATIO: 19
CALCIUM: 9.8 mg/dL (ref 8.7–10.4)
CHLORIDE: 108 mmol/L — ABNORMAL HIGH (ref 98–107)
CO2: 22.6 mmol/L (ref 20.0–31.0)
CREATININE: 1.96 mg/dL — ABNORMAL HIGH
EGFR CKD-EPI (2021) FEMALE: 30 mL/min/{1.73_m2} — ABNORMAL LOW (ref >=60)
GLUCOSE RANDOM: 166 mg/dL (ref 70–179)
POTASSIUM: 4.4 mmol/L (ref 3.4–4.8)
SODIUM: 139 mmol/L (ref 135–145)

## 2022-07-22 LAB — CBC W/ AUTO DIFF
BASOPHILS ABSOLUTE COUNT: 0 10*9/L (ref 0.0–0.1)
BASOPHILS RELATIVE PERCENT: 0.6 %
EOSINOPHILS ABSOLUTE COUNT: 0.1 10*9/L (ref 0.0–0.5)
EOSINOPHILS RELATIVE PERCENT: 1.3 %
HEMATOCRIT: 40.2 % (ref 34.0–44.0)
HEMOGLOBIN: 13.1 g/dL (ref 11.3–14.9)
LYMPHOCYTES ABSOLUTE COUNT: 1.6 10*9/L (ref 1.1–3.6)
LYMPHOCYTES RELATIVE PERCENT: 20.8 %
MEAN CORPUSCULAR HEMOGLOBIN CONC: 32.5 g/dL (ref 32.0–36.0)
MEAN CORPUSCULAR HEMOGLOBIN: 31.2 pg (ref 25.9–32.4)
MEAN CORPUSCULAR VOLUME: 96 fL — ABNORMAL HIGH (ref 77.6–95.7)
MEAN PLATELET VOLUME: 9.2 fL (ref 6.8–10.7)
MONOCYTES ABSOLUTE COUNT: 0.5 10*9/L (ref 0.3–0.8)
MONOCYTES RELATIVE PERCENT: 7.1 %
NEUTROPHILS ABSOLUTE COUNT: 5.3 10*9/L (ref 1.8–7.8)
NEUTROPHILS RELATIVE PERCENT: 70.2 %
PLATELET COUNT: 203 10*9/L (ref 150–450)
RED BLOOD CELL COUNT: 4.19 10*12/L (ref 3.95–5.13)
RED CELL DISTRIBUTION WIDTH: 13.6 % (ref 12.2–15.2)
WBC ADJUSTED: 7.5 10*9/L (ref 3.6–11.2)

## 2022-07-22 LAB — LIPID PANEL
CHOLESTEROL/HDL RATIO SCREEN: 6 — ABNORMAL HIGH (ref 1.0–4.5)
CHOLESTEROL: 168 mg/dL (ref <=200)
HDL CHOLESTEROL: 28 mg/dL — ABNORMAL LOW (ref 40–60)
LDL CHOLESTEROL CALCULATED: 92 mg/dL (ref 40–99)
NON-HDL CHOLESTEROL: 140 mg/dL — ABNORMAL HIGH (ref 70–130)
TRIGLYCERIDES: 239 mg/dL — ABNORMAL HIGH (ref 0–150)
VLDL CHOLESTEROL CAL: 47.8 mg/dL — ABNORMAL HIGH (ref 11–40)

## 2022-07-22 LAB — HEPATIC FUNCTION PANEL
ALBUMIN: 3.6 g/dL (ref 3.4–5.0)
ALKALINE PHOSPHATASE: 59 U/L (ref 46–116)
ALT (SGPT): 9 U/L — ABNORMAL LOW (ref 10–49)
AST (SGOT): 15 U/L (ref <=34)
BILIRUBIN DIRECT: <0.1 mg/dL (ref 0.00–0.30)
BILIRUBIN TOTAL: 0.3 mg/dL (ref 0.3–1.2)
PROTEIN TOTAL: 7 g/dL (ref 5.7–8.2)

## 2022-07-22 LAB — HEMOGLOBIN A1C
ESTIMATED AVERAGE GLUCOSE: 131 mg/dL
HEMOGLOBIN A1C: 6.2 % — ABNORMAL HIGH (ref 4.8–5.6)

## 2022-07-22 LAB — PROTEIN / CREATININE RATIO, URINE
CREATININE, URINE: 164.7 mg/dL
PROTEIN URINE: 42.7 mg/dL
PROTEIN/CREAT RATIO, URINE: 0.259

## 2022-07-22 LAB — MAGNESIUM: MAGNESIUM: 1.8 mg/dL (ref 1.6–2.6)

## 2022-07-22 LAB — PARATHYROID HORMONE (PTH): PARATHYROID HORMONE INTACT: 142.1 pg/mL — ABNORMAL HIGH (ref 18.4–80.1)

## 2022-07-22 LAB — PHOSPHORUS: PHOSPHORUS: 3.7 mg/dL (ref 2.4–5.1)

## 2022-07-22 LAB — TACROLIMUS LEVEL, TROUGH: TACROLIMUS, TROUGH: 9.7 ng/mL (ref 5.0–15.0)

## 2022-07-22 LAB — CMV DNA, QUANTITATIVE, PCR: CMV VIRAL LD: NOT DETECTED

## 2022-07-22 NOTE — Unmapped (Signed)
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   Met wit pt during clinic visit today. Pt doing well. Pt did get labs today.   Pt would like to see doctor for her tingling in feet and pain in hips- referrals for endocrine and ortho placed    Assessment  BP: 109/67 today in clinic  Lightheaded: denies  BG: 120-130's   Headache: always  Hand tremors: denies  Numbness/tingling: in feet  Fevers: denies  Chills/sweats: denies  Shortness of breath: denies  Chest pain or pressure: denies  Palpitations: denies  Abdominal pain: denies  Heart burn: denies  Nausea/vomiting: denies  Diarrhea/constipation: denies  UTI symptoms: denies  Swelling: denies    Good appetite; reports adequate hydration.     Any new medications? denies  Immunosuppressant last taken: 2100 last night     Functional Score: 100   Normal no complaints; no evidence of  disease.     I spent a total of 20 minutes with Nancy Bowman reviewing medications and symptoms.

## 2022-07-22 NOTE — Unmapped (Signed)
Transplant Nephrology Clinic Visit      Primary Care Physician: Olevia Perches, DO    History of Present Illness    Patient is a 56 y.o. 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  She was on dialysis approximately 7 years prior to this transplant, the donor kidney had a KDPI of 65%.  She did have issues with hyperkalemia while on dialysis, and was on Kayexalate weekly.  Her post transplant course has been without rejection or recurrent disease. Patient does have evidence of a donor specific antibody to DP01 at an MFI of 2789 (stable) on 08/31/18. Her most recent creatinine has ranged between 1.6 and 2.4.    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     In December 2016, she underwent routine mammogram that was abnormal. Follow-up mammogram in January was again highly suggestive of significant lesion in the left breast. She underwent core biopsy on 09/09/2015 which showed ductal carcinoma in situ. She subsequently underwent a partial mastectomy on 09/24/2015. 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 That final pathology showed a positive medial margin, so she underwent reexcision on 10/25/2015 for which the pathology was negative. Her surgical management was felt to be complete.     The patient was hospitalized in [place] in the beginning of January for 2-3 days with sinus congestion, shortness of breath on exertion, and scant hemoptysis. She was diagnosed with URI and treated with Levaquin. Her symptoms have improved.    Patient was admitted from 1/6 through 08/09/18 for chest pain. Serial troponins negative, echo unremarkable with EF 65-70%.     She was again admitted 1/19 through 08/24/18, after presenting with several days of left lower abdominal pain. CT notable for diverticulitis with leukocytosis on labs. She was treated with IV ciprofloxacin and Flagyl during admission. Discharged home on 8 day course of PO cipro and flagyl. Creatinine remained stable during admission.     Interval history since phone visit on 10/06/2019:    She was seen in the ED at [facility] on 12/05/19 complaining of headache, sinus congestion and cough. Nasal spray, antihistamine and Mucinex not helping. She was admitted from 5/4 through 12/06/19 and given Rocephin for possible bacterial sinusitis and continued cefdinir as an outpatient for 5 days. Creatinine 2.76 on presentation, 1.87 on discharge. Blood cultures negative.     She called the on-call coordinator 12/18/19 asking for med refills that apparently her PCP was unwilling to refill.     She presents today for follow up.    She has been getting some labs at [facility], though they do not populate into our system. On 8/23 and last on 05/23/20 with creatinine of 1.97.    She reports that she had missed some appointments at her PCP which is why they would not refill her meds. She is getting back on track with them and has an appointment before Christmas. She also needs to reschedule her mammogram. Would like to get a colonoscopy through [facility], at [facility] if possible.     She specifically denies fever, myalgias, upper respiratory/GI symptoms, or change in taste/smell.    Last dose of Prograf: 9 pm     Review of Systems    Otherwise on review of systems patient denies fever or chills, chest pain, SOB, PND or orthopnea, lower extremity edema.  Denies abdominal pain.  No dysuria,  hematuria or difficulty voiding. Bowel movements normal.  Denies joint pain or rash.  All other systems are reviewed and are negative.    Medications    Current Outpatient Medications   Medication Sig Dispense Refill    aspirin 81 MG chewable tablet Chew 1 tablet (81 mg total) daily. 30 tablet 11    blood sugar diagnostic (ONETOUCH ULTRA TEST) Strp by Other route four (4) times a day. 200 strip 11    blood-glucose meter kit Use as instructed 1 each 0    carvediloL (COREG) 12.5 MG tablet Take 1 tablet (12.5 mg total) by mouth two (2) times a day. TAKE 1 TABLET (12.5 MG TOTAL) BY MOUTH TWO (2) TIMES A DAY. 60 tablet 11    cetirizine (ZYRTEC) 5 MG chewable tablet Chew 1 tablet (5 mg total) daily. 30 tablet 11    cholecalciferol, vitamin D3-25 mcg, 1,000 unit,, (VITAMIN D3) 25 mcg (1,000 unit) capsule Take 1 capsule (25 mcg total) by mouth daily. 30 capsule 11    fluticasone propionate (FLONASE) 50 mcg/actuation nasal spray Two sprays each nare twice a day 16 g 3    gabapentin (NEURONTIN) 300 MG capsule Take 1 capsule (300 mg ) by mouth in the morning and at noon, 2 capsules (600 mg) in the evening 360 capsule 2    glipiZIDE (GLUCOTROL) 5 MG tablet Take 1 tablet (5 mg total) by mouth daily. 30 tablet 11    levETIRAcetam (KEPPRA) 500 MG tablet Take 1 tablet (500 mg total) by mouth Two (2) times a day. 60 tablet 0    levETIRAcetam (KEPPRA) 500 MG tablet Take 1 tablet (500 mg total) by mouth Two (2) times a day. 60 tablet 0    levETIRAcetam (KEPPRA) 500 MG tablet Take 1 tablet (500 mg total) by mouth two (2) times a day. 60 tablet 0    levETIRAcetam (KEPPRA) 500 MG tablet Take 1 tablet (500 mg total) by mouth two (2) times a day. 60 tablet 11    magnesium oxide (MAG-OX) 400 mg (241.3 mg elemental magnesium) tablet Take 1 tablet (400 mg total) by mouth daily. 30 tablet 11    miscellaneous medical supply Misc 1 knee brace 1 each 0    mycophenolate (MYFORTIC) 180 MG EC tablet Take 2 tablets (360 mg total) by mouth Three (3) times a day. 180 tablet 11    omeprazole (PRILOSEC) 20 MG capsule Take 1 capsule (20 mg total) by mouth daily. prn 30 capsule 11    sodium bicarbonate 650 mg tablet Take 2 tablets (1300 mg) three times a day. 180 tablet 11    tacrolimus (PROGRAF) 1 MG capsule Take 4 capsules (4 mg total) by mouth two (2) times a day. 240 capsule 11    tamoxifen (NOLVADEX) 20 MG tablet Take 1 tablet (20 mg total) by mouth daily. 90 tablet 0    traMADoL (ULTRAM) 50 mg tablet Take 1 tablet (50 mg total) by mouth every eight (8) hours as needed for pain. 60 tablet 2    valACYclovir (VALTREX) 500 MG tablet Take 1 tablet (500 mg total) by mouth daily. 30 tablet 11     No current facility-administered medications for this visit.       Physical Exam    Ht 160 cm (5' 2.99)  - Wt 74.7 kg (164 lb 9.6 oz)  - LMP 09/04/2015  - BMI 29.16 kg/m??   General: Patient is a pleasant female in no apparent distress.  Eyes: Sclera anicteric.  ENT: Mask  in place.  Neck: Supple without LAD/JVD/bruits.  Lungs: Clear to auscultation bilaterally, no wheezes/rales/rhonchi.  Cardiovascular: Regular rate and rhythm without murmurs, rubs or gallops.  Abdomen: Soft, notender/nondistended. Positive bowel sounds. No tenderness over the graft.  Extremities: Without edema, joints without evidence of synovitis.  Skin: Without rash.  Neurological: Grossly nonfocal.  Psychiatric: Mood and affect appropriate.      Laboratory Results    Results for orders placed or performed in visit on 07/11/20   Urine Culture    Specimen: Clean Catch; Urine   Result Value Ref Range    Urine Culture, Comprehensive Mixed Urogenital Flora    Phosphorus Level   Result Value Ref Range    Phosphorus 3.5 2.4 - 5.1 mg/dL   Magnesium Level   Result Value Ref Range    Magnesium 1.8 1.6 - 2.6 mg/dL   Urinalysis   Result Value Ref Range    Color, UA Yellow     Clarity, UA Clear     Specific Gravity, UA 1.020 1.005 - 1.030    pH, UA 6.0 5.0 - 9.0    Leukocyte Esterase, UA Negative Negative    Nitrite, UA Negative Negative    Protein, UA 30 mg/dL (A) Negative    Glucose, UA Negative Negative    Ketones, UA Negative Negative    Urobilinogen, UA 0.2 mg/dL 0.2 - 2.0 mg/dL    Bilirubin, UA Negative Negative    Blood, UA Small (A) Negative    RBC, UA 4 (H) <4 /HPF    WBC, UA 5 0 - 5 /HPF    Squam Epithel, UA <1 0 - 5 /HPF    Bacteria, UA Occasional (A) None Seen /HPF   Protein/Creatinine Ratio, Urine   Result Value Ref Range    Creat U 138.6 Undefined mg/dL    Protein, Ur 16.1 mg/dL    Protein/Creatinine Ratio, Urine 0.282 Undefined   Comprehensive Metabolic Panel   Result Value Ref Range    Sodium 136 135 - 145 mmol/L    Potassium 4.5 3.4 - 4.5 mmol/L    Chloride 106 98 - 107 mmol/L    Anion Gap 4 (L) 5 - 14 mmol/L    CO2 26.0 20.0 - 31.0 mmol/L    BUN 30 (H) 9 - 23 mg/dL    Creatinine 0.96 (H) 0.60 - 0.80 mg/dL    BUN/Creatinine Ratio 17     EGFR CKD-EPI Non-African American, Female 33 (L) >=60 mL/min/1.37m2    EGFR CKD-EPI African American, Female 38 (L) >=60 mL/min/1.106m2    Glucose 166 70 - 179 mg/dL    Calcium 04.5 (H) 8.7 - 10.4 mg/dL    Albumin 4.0 3.4 - 5.0 g/dL    Total Protein 7.4 5.7 - 8.2 g/dL    Total Bilirubin 0.4 0.3 - 1.2 mg/dL    AST 11 <=40 U/L    ALT 12 10 - 49 U/L    Alkaline Phosphatase 54 46 - 116 U/L   Tacrolimus Level, Trough   Result Value Ref Range    Tacrolimus, Trough 6.5 5.0 - 15.0 ng/mL   CMV DNA, quantitative, PCR   Result Value Ref Range    CMV Viral Ld Not Detected Not Detected    CMV Quant      CMV Quant Log(10)      CMV Comment     BK Virus, DNA, Quantitative, Serum   Result Value Ref Range    BK Blood Result Not Detected Not Detected  BK Blood Quant      BK Blood Log(10)      Bk Blood Comment     CBC w/ Differential   Result Value Ref Range    WBC 7.2 3.5 - 10.5 10*9/L    RBC 4.11 3.90 - 5.03 10*12/L    HGB 12.6 12.0 - 15.5 g/dL    HCT 43.3 29.5 - 18.8 %    MCV 94.1 82.0 - 98.0 fL    MCH 30.5 26.0 - 34.0 pg    MCHC 32.4 30.0 - 36.0 g/dL    RDW 41.6 60.6 - 30.1 %    MPV 8.5 7.0 - 10.0 fL    Platelet 238 150 - 450 10*9/L    Neutrophils % 68.0 %    Lymphocytes % 21.3 %    Monocytes % 7.8 %    Eosinophils % 2.1 %    Basophils % 0.8 %    Absolute Neutrophils 4.9 1.7 - 7.7 10*9/L    Absolute Lymphocytes 1.5 0.7 - 4.0 10*9/L    Absolute Monocytes 0.6 0.1 - 1.0 10*9/L    Absolute Eosinophils 0.1 0.0 - 0.7 10*9/L    Absolute Basophils 0.1 0.0 - 0.1 10*9/L     *Note: Due to a large number of results and/or encounters for the requested time period, some results have not been displayed. A complete set of results can be found in Results Review.       Assessment and Plan    1. Status post renal transplant. Her creatinine today of 1.73 is within her baseline. Her prograf trough of 6.5 is a 12 hour trough and at goal of 6-8. Will continue current immunosuppression.    2. Breast cancer status post resection. On tamoxifen and tolerating that well. Had been scheduled for regular oncology follow up and mammography screening in June, but was postponed due to covid. She has not yet rescheduled, was again encouraged to do so.    3. Secondary hyperparathyroidism. Calcium has been mildly elevated, 10.8 today. If remains high may need to start Sensipar.    4. History of hyperkalemia. Potassium normal at 4.5. She has been educated on low potassium diet. Would not treat unless > 6.    5. Metabolic acidosis. HCO3 normal at 26. Continue current supplementation, if remains in normal range may be able to reduce.     6. History of seizure disorder. Patient denies seizure activity and continues on Keppra.     7. Hypertension. Blood pressure controlled on current regimen.     8. Chronic lower back and leg pain. Stable.     9. Diabetes mellitus. Hgb A1c 6.8 in January 2020. Managed elsewhere. Reports good glucose control.    10. Elevated LDL of 103 in 08/2017. Continues to trend down, 27 on last labs though was on 08/31/18. On atorvastatin. Will continue to follow closely, and try to get lipid panel at next visit.     11. Health maintenance. She received a flu shot this past fall. Still needs Shingrix and pneumococcal vaccines. She has completed the COVID vaccine and had a booster today. Got her flu shot today 07/11/20.    We discussed that while we continue to recommend the COVID vaccine for all eligible patients, we know that kidney transplant patients do not respond as strongly due to their immunosuppression, so she should continue to follow the CDC guidelines for patients who are not vaccinated. I would also encourage all household members and close contacts that are eligible for  vaccination to get vaccinated. We discussed the importance of ongoing social distancing, wearing a mask outside the home, and hand washing/sanitizing.     12. Will see patient back in 4 months, or sooner if needed.

## 2022-07-24 LAB — VITAMIN D 25 HYDROXY: VITAMIN D, TOTAL (25OH): 23.3 ng/mL (ref 20.0–80.0)

## 2022-07-28 DIAGNOSIS — Z79899 Other long term (current) drug therapy: Principal | ICD-10-CM

## 2022-07-28 DIAGNOSIS — E1369 Other specified diabetes mellitus with other specified complication: Principal | ICD-10-CM

## 2022-07-28 DIAGNOSIS — Z940 Kidney transplant status: Principal | ICD-10-CM

## 2022-07-28 MED ORDER — ONETOUCH ULTRA TEST STRIPS
ORAL_STRIP | Freq: Four times a day (QID) | 11 refills | 0 days | Status: CP
Start: 2022-07-28 — End: 2022-08-27

## 2022-07-28 NOTE — Unmapped (Signed)
Pt paged on call needing her test strips refilled and several other medications that were changed/discussed in clinic last week.   She reports needing the test strips today sent to [facility].     Sent message to primary coordinator.

## 2022-07-28 NOTE — Unmapped (Signed)
[facility] Refill Coordination Note    Specialty Medication(s) to be Shipped:   Transplant:  mycophenolic acid 180mg  and tacrolimus 1mg     Other medication(s) to be shipped: No additional medications requested for fill at this time     Nancy Bowman, DOB: 1965-12-04  Phone: [phone] (home)       All above HIPAA information was verified with patient.     Was a Nurse, learning disability used for this call? No    Completed refill call assessment today to schedule patient's medication shipment from the [facility] [phone]).  All relevant notes have been reviewed.     Specialty medication(s) and dose(s) confirmed: Regimen is correct and unchanged.   Changes to medications: Norberta reports no changes at this time.  Changes to insurance: No  New side effects reported not previously addressed with a pharmacist or physician: None reported  Questions for the pharmacist: No    Confirmed patient received a Conservation officer, historic buildings and a Surveyor, mining with first shipment. The patient will receive a drug information handout for each medication shipped and additional FDA Medication Guides as required.       DISEASE/MEDICATION-SPECIFIC INFORMATION        N/A    SPECIALTY MEDICATION ADHERENCE     Medication Adherence    Patient reported X missed doses in the last month: 0  Specialty Medication: mycophenolate 180 MG EC tablet (MYFORTIC)  Patient is on additional specialty medications: Yes  Additional Specialty Medications: tacrolimus 1 MG capsule (PROGRAF)  Patient Reported Additional Medication X Missed Doses in the Last Month: 0  Patient is on more than two specialty medications: No      Adherence tools used: patient uses a pill box to manage medications                      Were doses missed due to medication being on hold? No    Tacrolimus 1mg   : 3 days of medicine on hand   Mycophenolate 180mg   : 2 days of medicine on hand       REFERRAL TO PHARMACIST     Referral to the pharmacist: Not needed      [facility]     Shipping address confirmed in Epic.     Delivery Scheduled: Yes, Expected medication delivery date: 07/30/2022.     Medication will be delivered via UPS to the prescription address in Epic WAM.    Thad Ranger, PharmD   [facility] Specialty Pharmacist

## 2022-07-29 DIAGNOSIS — N6489 Other specified disorders of breast: Principal | ICD-10-CM

## 2022-07-29 DIAGNOSIS — E139 Other specified diabetes mellitus without complications: Principal | ICD-10-CM

## 2022-07-29 DIAGNOSIS — Z940 Kidney transplant status: Principal | ICD-10-CM

## 2022-07-29 DIAGNOSIS — M25552 Pain in left hip: Principal | ICD-10-CM

## 2022-07-29 DIAGNOSIS — Z794 Long term (current) use of insulin: Principal | ICD-10-CM

## 2022-07-29 DIAGNOSIS — Z9889 Other specified postprocedural states: Principal | ICD-10-CM

## 2022-07-29 DIAGNOSIS — R202 Paresthesia of skin: Principal | ICD-10-CM

## 2022-07-29 DIAGNOSIS — M25551 Pain in right hip: Principal | ICD-10-CM

## 2022-07-29 DIAGNOSIS — Z79899 Other long term (current) drug therapy: Principal | ICD-10-CM

## 2022-07-29 DIAGNOSIS — N186 End stage renal disease: Principal | ICD-10-CM

## 2022-07-29 DIAGNOSIS — D0512 Intraductal carcinoma in situ of left breast: Principal | ICD-10-CM

## 2022-07-29 DIAGNOSIS — E1369 Other specified diabetes mellitus with other specified complication: Principal | ICD-10-CM

## 2022-07-29 LAB — VITAMIN D 1,25 DIHYDROXY: VITAMIN D 1,25-DIHYDROXY: 29 pg/mL

## 2022-07-29 MED ORDER — ONETOUCH ULTRA TEST STRIPS
ORAL_STRIP | Freq: Four times a day (QID) | 11 refills | 0 days | Status: CP
Start: 2022-07-29 — End: 2022-08-28

## 2022-07-29 MED FILL — MYCOPHENOLATE SODIUM 180 MG TABLET,DELAYED RELEASE: ORAL | 30 days supply | Qty: 180 | Fill #5

## 2022-07-29 MED FILL — TACROLIMUS 1 MG CAPSULE, IMMEDIATE-RELEASE: ORAL | 30 days supply | Qty: 240 | Fill #5

## 2022-07-29 NOTE — Unmapped (Signed)
Appointment letter mailed

## 2022-07-31 LAB — HLA DS POST TRANSPLANT
ANTI-DONOR DRW #1 MFI: 218 MFI
ANTI-DONOR DRW #2 MFI: 69 MFI
ANTI-DONOR HLA-A #1 MFI: 138 MFI
ANTI-DONOR HLA-A #2 MFI: 54 MFI
ANTI-DONOR HLA-B #1 MFI: 381 MFI
ANTI-DONOR HLA-B #2 MFI: 247 MFI
ANTI-DONOR HLA-C #1 MFI: 403 MFI
ANTI-DONOR HLA-C #2 MFI: 744 MFI
ANTI-DONOR HLA-DQB #1 MFI: 271 MFI
ANTI-DONOR HLA-DQB #2 MFI: 123 MFI
ANTI-DONOR HLA-DR #1 MFI: 188 MFI
ANTI-DONOR HLA-DR #2 MFI: 172 MFI

## 2022-07-31 LAB — FSAB CLASS 2 ANTIBODY SPECIFICITY: HLA CL2 AB RESULT: POSITIVE

## 2022-07-31 LAB — FSAB CLASS 1 ANTIBODY SPECIFICITY: HLA CLASS 1 ANTIBODY RESULT: NEGATIVE

## 2022-08-26 MED FILL — MYCOPHENOLATE SODIUM 180 MG TABLET,DELAYED RELEASE: ORAL | 30 days supply | Qty: 180 | Fill #6

## 2022-08-26 MED FILL — TACROLIMUS 1 MG CAPSULE, IMMEDIATE-RELEASE: ORAL | 30 days supply | Qty: 240 | Fill #6

## 2022-08-26 NOTE — Unmapped (Signed)
[facility] Refill Coordination Note    Specialty Medication(s) to be Shipped:   Transplant:  mycophenolic acid 180mg  and tacrolimus 1mg     Other medication(s) to be shipped: No additional medications requested for fill at this time     Nancy Bowman, DOB: 02/12/1966  Phone: [phone] (home)       All above HIPAA information was verified with patient.     Was a Nurse, learning disability used for this call? No    Completed refill call assessment today to schedule patient's medication shipment from the [facility] [phone]).  All relevant notes have been reviewed.     Specialty medication(s) and dose(s) confirmed: Regimen is correct and unchanged.   Changes to medications: Lossie reports no changes at this time.  Changes to insurance: No  New side effects reported not previously addressed with a pharmacist or physician: None reported  Questions for the pharmacist: No    Confirmed patient received a Conservation officer, historic buildings and a Surveyor, mining with first shipment. The patient will receive a drug information handout for each medication shipped and additional FDA Medication Guides as required.       DISEASE/MEDICATION-SPECIFIC INFORMATION        N/A    SPECIALTY MEDICATION ADHERENCE     Medication Adherence    Patient reported X missed doses in the last month: 0  Specialty Medication: mycophenolate 180 MG EC tablet (MYFORTIC)  Patient is on additional specialty medications: Yes  Additional Specialty Medications: tacrolimus 1 MG capsule (PROGRAF)  Patient Reported Additional Medication X Missed Doses in the Last Month: 0  Patient is on more than two specialty medications: No      Adherence tools used: patient uses a pill box to manage medications                          Were doses missed due to medication being on hold? No    Mycophenolate  180mg  : 7 days of medicine on hand   Tacrolimus 1mg   : 7 days of medicine on hand       REFERRAL TO PHARMACIST     Referral to the pharmacist: Not needed      [facility]     Shipping address confirmed in Epic.     Delivery Scheduled: Yes, Expected medication delivery date: 08/27/2022.     Medication will be delivered via UPS to the prescription address in Epic WAM.    Thad Ranger, PharmD   [facility] Specialty Pharmacist

## 2022-08-27 ENCOUNTER — Ambulatory Visit: Payer: Medicare Other | Admitting: Radiation Oncology

## 2022-09-14 DIAGNOSIS — E139 Other specified diabetes mellitus without complications: Principal | ICD-10-CM

## 2022-09-14 DIAGNOSIS — Z794 Long term (current) use of insulin: Principal | ICD-10-CM

## 2022-09-14 MED ORDER — BLOOD-GLUCOSE METER KIT WRAPPER
0 refills | 0 days | Status: CP
Start: 2022-09-14 — End: 2023-09-14

## 2022-09-15 DIAGNOSIS — R52 Pain, unspecified: Principal | ICD-10-CM

## 2022-09-15 MED ORDER — LIDOCAINE 5 % TOPICAL PATCH
MEDICATED_PATCH | Freq: Two times a day (BID) | TRANSDERMAL | 0 refills | 15 days | Status: CP | PRN
Start: 2022-09-15 — End: 2022-10-15

## 2022-09-18 NOTE — Unmapped (Signed)
[facility] Refill Coordination Note    Specialty Medication(s) to be Shipped:   Transplant:  mycophenolic acid 180mg  and tacrolimus 1mg     Other medication(s) to be shipped: No additional medications requested for fill at this time     Nancy Bowman, DOB: 1966/02/07  Phone: [phone] (home)       All above HIPAA information was verified with patient.     Was a Nurse, learning disability used for this call? No    Completed refill call assessment today to schedule patient's medication shipment from the [facility] [phone]).  All relevant notes have been reviewed.     Specialty medication(s) and dose(s) confirmed: Regimen is correct and unchanged.   Changes to medications: [name] reports no changes at this time.  Changes to insurance: No  New side effects reported not previously addressed with a pharmacist or physician: None reported  Questions for the pharmacist: No    Confirmed patient received a Conservation officer, historic buildings and a Surveyor, mining with first shipment. The patient will receive a drug information handout for each medication shipped and additional FDA Medication Guides as required.       DISEASE/MEDICATION-SPECIFIC INFORMATION        N/A    SPECIALTY MEDICATION ADHERENCE     Medication Adherence    Patient reported X missed doses in the last month: 0  Specialty Medication: mycophenolate 180mg   Patient is on additional specialty medications: Yes  Additional Specialty Medications: Tacrolimus 1mg   Patient Reported Additional Medication X Missed Doses in the Last Month: 0  Adherence tools used: patient uses a pill box to manage medications              Were doses missed due to medication being on hold? No    Mycophenolate 180mg   : 8 days of medicine on hand   Tacrolimus 1mg   : 8 days of medicine on hand       REFERRAL TO PHARMACIST     Referral to the pharmacist: Not needed      [facility]     Shipping address confirmed in Epic.     Patient was notified of new phone menu : Yes: changes coming    Delivery Scheduled: Yes, Expected medication delivery date: 09/23/2022.     Medication will be delivered via UPS to the prescription address in Epic WAM.    Thad Ranger, PharmD   [facility] Specialty Pharmacist

## 2022-09-22 MED FILL — MYCOPHENOLATE SODIUM 180 MG TABLET,DELAYED RELEASE: ORAL | 30 days supply | Qty: 180 | Fill #7

## 2022-09-22 MED FILL — TACROLIMUS 1 MG CAPSULE, IMMEDIATE-RELEASE: ORAL | 30 days supply | Qty: 240 | Fill #7

## 2022-09-28 ENCOUNTER — Ambulatory Visit
Admit: 2022-09-28 | Payer: MEDICAID | Attending: Rehabilitative and Restorative Service Providers" | Primary: Rehabilitative and Restorative Service Providers"

## 2022-10-01 DIAGNOSIS — E1369 Other specified diabetes mellitus with other specified complication: Principal | ICD-10-CM

## 2022-10-01 DIAGNOSIS — Z940 Kidney transplant status: Principal | ICD-10-CM

## 2022-10-01 DIAGNOSIS — Z79899 Other long term (current) drug therapy: Principal | ICD-10-CM

## 2022-10-01 MED ORDER — ONETOUCH ULTRA TEST STRIPS
ORAL_STRIP | Freq: Four times a day (QID) | 11 refills | 0 days | Status: CP
Start: 2022-10-01 — End: 2022-10-01

## 2022-10-02 DIAGNOSIS — E1369 Other specified diabetes mellitus with other specified complication: Principal | ICD-10-CM

## 2022-10-02 DIAGNOSIS — Z940 Kidney transplant status: Principal | ICD-10-CM

## 2022-10-02 DIAGNOSIS — Z79899 Other long term (current) drug therapy: Principal | ICD-10-CM

## 2022-10-02 MED ORDER — ONETOUCH ULTRA TEST STRIPS
ORAL_STRIP | 11 refills | 0 days | Status: CP
Start: 2022-10-02 — End: 2022-12-28

## 2022-10-02 NOTE — Unmapped (Signed)
Resent glucose strips. Per pt she needs tramadol, plan for primary [facility] to send that.

## 2022-10-02 NOTE — Unmapped (Signed)
Script resent with prn

## 2022-10-11 ENCOUNTER — Ambulatory Visit
Admit: 2022-10-11 | Discharge: 2022-10-12 | Disposition: A | Discharge status: Home / Self Care | Payer: MEDICARE | Attending: Emergency Medicine

## 2022-10-11 ENCOUNTER — Emergency Department
Admit: 2022-10-11 | Discharge: 2022-10-12 | Disposition: A | Discharge status: Home / Self Care | Payer: MEDICARE | Attending: Emergency Medicine

## 2022-10-11 LAB — COMPREHENSIVE METABOLIC PANEL
ALBUMIN: 3.8 g/dL (ref 3.4–5.0)
ALKALINE PHOSPHATASE: 63 U/L (ref 46–116)
ALT (SGPT): 9 U/L — ABNORMAL LOW (ref 10–49)
ANION GAP: 7 mmol/L (ref 5–14)
AST (SGOT): 13 U/L (ref <=34)
BILIRUBIN TOTAL: 0.3 mg/dL (ref 0.3–1.2)
BLOOD UREA NITROGEN: 40 mg/dL — ABNORMAL HIGH (ref 9–23)
BUN / CREAT RATIO: 19
CALCIUM: 10.1 mg/dL (ref 8.7–10.4)
CHLORIDE: 110 mmol/L — ABNORMAL HIGH (ref 98–107)
CO2: 21 mmol/L (ref 20.0–31.0)
CREATININE: 2.14 mg/dL — ABNORMAL HIGH
EGFR CKD-EPI (2021) FEMALE: 27 mL/min/{1.73_m2} — ABNORMAL LOW (ref >=60)
GLUCOSE RANDOM: 126 mg/dL (ref 70–179)
POTASSIUM: 5.6 mmol/L — ABNORMAL HIGH (ref 3.4–4.8)
PROTEIN TOTAL: 7.7 g/dL (ref 5.7–8.2)
SODIUM: 138 mmol/L (ref 135–145)

## 2022-10-11 LAB — CBC W/ AUTO DIFF
BASOPHILS ABSOLUTE COUNT: 0.1 10*9/L (ref 0.0–0.1)
BASOPHILS RELATIVE PERCENT: 0.9 %
EOSINOPHILS ABSOLUTE COUNT: 0.1 10*9/L (ref 0.0–0.5)
EOSINOPHILS RELATIVE PERCENT: 1.5 %
HEMATOCRIT: 40.2 % (ref 34.0–44.0)
HEMOGLOBIN: 13.3 g/dL (ref 11.3–14.9)
LYMPHOCYTES ABSOLUTE COUNT: 1.5 10*9/L (ref 1.1–3.6)
LYMPHOCYTES RELATIVE PERCENT: 18.9 %
MEAN CORPUSCULAR HEMOGLOBIN CONC: 33.2 g/dL (ref 32.0–36.0)
MEAN CORPUSCULAR HEMOGLOBIN: 30.6 pg (ref 25.9–32.4)
MEAN CORPUSCULAR VOLUME: 92.2 fL (ref 77.6–95.7)
MEAN PLATELET VOLUME: 8.5 fL (ref 6.8–10.7)
MONOCYTES ABSOLUTE COUNT: 0.4 10*9/L (ref 0.3–0.8)
MONOCYTES RELATIVE PERCENT: 4.6 %
NEUTROPHILS ABSOLUTE COUNT: 5.7 10*9/L (ref 1.8–7.8)
NEUTROPHILS RELATIVE PERCENT: 74.1 %
PLATELET COUNT: 245 10*9/L (ref 150–450)
RED BLOOD CELL COUNT: 4.36 10*12/L (ref 3.95–5.13)
RED CELL DISTRIBUTION WIDTH: 13.8 % (ref 12.2–15.2)
WBC ADJUSTED: 7.7 10*9/L (ref 3.6–11.2)

## 2022-10-11 LAB — URINALYSIS WITH MICROSCOPY WITH CULTURE REFLEX
BACTERIA: NONE SEEN /HPF
BILIRUBIN UA: NEGATIVE
GLUCOSE UA: NEGATIVE
HYALINE CASTS: 1 /LPF (ref 0–1)
KETONES UA: NEGATIVE
NITRITE UA: NEGATIVE
PH UA: 5.5 (ref 5.0–9.0)
PROTEIN UA: 50 — AB
RBC UA: 2 /HPF (ref <=4)
SPECIFIC GRAVITY UA: 1.018 (ref 1.003–1.030)
SQUAMOUS EPITHELIAL: 4 /HPF (ref 0–5)
UROBILINOGEN UA: <2
WBC UA: 3 /HPF (ref 0–5)

## 2022-10-11 LAB — HCG QUANTITATIVE, BLOOD: GONADOTROPIN, CHORIONIC (HCG) QUANT: 3.9 m[IU]/mL

## 2022-10-11 LAB — LIPASE: LIPASE: 55 U/L — ABNORMAL HIGH (ref 12–53)

## 2022-10-11 MED ADMIN — morphine 4 mg/mL injection 4 mg: 4 mg | INTRAVENOUS | Stop: 2022-10-11

## 2022-10-12 DIAGNOSIS — Z794 Long term (current) use of insulin: Principal | ICD-10-CM

## 2022-10-12 DIAGNOSIS — E139 Other specified diabetes mellitus without complications: Principal | ICD-10-CM

## 2022-10-12 MED ORDER — BLOOD-GLUCOSE METER KIT WRAPPER
0 refills | 0 days | Status: CP
Start: 2022-10-12 — End: 2023-10-12

## 2022-10-12 MED ADMIN — sodium zirconium cyclosilicate (LOKELMA) packet 10 g: 10 g | ORAL | @ 05:00:00 | Stop: 2022-10-12

## 2022-10-12 MED ADMIN — ondansetron (ZOFRAN) injection 4 mg: 4 mg | INTRAVENOUS | @ 03:00:00 | Stop: 2022-10-11

## 2022-10-12 MED ADMIN — morphine 4 mg/mL injection 4 mg: 4 mg | INTRAVENOUS | @ 03:00:00 | Stop: 2022-10-11

## 2022-10-12 MED ADMIN — sodium chloride 0.9% (NS) bolus 500 mL: 500 mL | INTRAVENOUS | @ 01:00:00 | Stop: 2022-10-11

## 2022-10-12 NOTE — Unmapped (Signed)
Pt reports sharp pains in her stomach that radiates towards her appendix. Pt reports no appetite. Pt able to ambulate with a cane at home. Pt states her cane is broke.     Kidney transplant pt.

## 2022-10-12 NOTE — Unmapped (Signed)
Patient called to update that she was in the ED over the weekend and her potassium was high. She was given lokelma in the ED.    Asked patient to repeat labs this week    Requested refill on glucose meter. Prescription sent    Reports she is doing ok otherwise and will check back in on lab results later this week.  New lab order sent to [facility]

## 2022-10-12 NOTE — Unmapped (Signed)
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  She reports intermittent right lower quadrant abdominal pain since December 2023 which is sharp in nature.  She states her pain over the past 2 weeks has been constant, made worse with palpation of the abdomen or with positional changes.  Associated symptoms include nausea and diminished appetite, she is concerned she may have appendicitis.  She notes she continues to make urine and denies any dysuria, hematuria or flank pain. Also denies fevers, chills, chest pain, SOB.     On exam: She is well-appearing no acute distress.  BP 105/79, heart rate 90, afebrile, SpO2 99% on room air.  Normal cardiopulmonary exam.  She has tenderness to palpation in the suprapubic abdomen and right lower quadrant without rebound or guarding.  No CVA tenderness. Transplanted kidney in RLQ. No pedal edema.  2+ radial pulses bilaterally.    Differential diagnosis includes UTI, pyelonephritis, renal colic, AAA, transplant rejection amongst multiple other potential etiologies.  Also considering acute appendicitis, though seems less likely considering she has had intermittent right lower quadrant pain since December 2023, though it has been more persistent over the past 2 weeks.  Labs obtained thus far show creatinine 2.14 (stable from 2 months ago).  eGFR 27.  Urinalysis shows trace leukocyte esterase, negative nitrites, no pyuria.  Potassium 5.6.  EKG shows normal sinus rhythm, normal axis, normal intervals, no ST segment elevation or depression.  No peaked T waves.  Will check CT abdomen pelvis without contrast considering her poor GFR to evaluate for renal colic versus AAA amongst others.  Will also check transplant ultrasound of the kidney.  See course below.    BP 105/79  - Pulse 90  - Temp 37.1 ??C (98.8 ??F) (Oral)  - Resp 21  - Ht 165.1 cm (5' 5)  - Wt 74.7 kg (164 lb 10.9 oz)  - LMP 09/04/2015  - SpO2 99%  - BMI 27.40 kg/m??       Discussion of Management with other Physicians, QHP, or Appropriate Source:   Discussion with other professionals: None      Independent Interpretation of Studies:   Independent interpretation: EKG(s) - see above.      External Records Reviewed:   -Reviewed 07/22/2022 [place] kidney transplant note.      Diagnostic tests considered but not performed:   Diagnostic tests considered but not performed: Considered CT abdomen pelvis with IV contrast but due to her poor renal function, contrasted scan was deferred.      Additional Progress Notes    ED Course as of 10/13/22 0423   Mon Oct 12, 2022   0000 Ultrasound renal transplant:  IMPRESSION:  No significant change compared with prior study. Stable resistive indices in the renal transplant arteries, within normal limits.     0057 CT abdomen pelvis:  IMPRESSION:     1. No acute abnormality identified in the abdomen or pelvis.  2. Sequelae of end-stage renal disease with pronounced atrophy of  the native kidneys. Right iliac fossa renal allograft noted; no perinephric fluid collection or hydronephrosis.     0102 Bun(!): 40   0102 Creatinine(!): 2.14   0102 Potassium(!): 5.6   0102 Lipase(!): 55   0102 WBC: 7.7   0102 HGB: 13.3   0102 Leukocyte Esterase, UA(!): Trace   0102 Nitrite, UA: Negative   0102 WBC, UA: 3   0102 Bacteria, UA: None Seen   0146 Patient reassessed.  She has tolerated p.o. intake here without difficulty.  CT abdomen pelvis shows normal appendix today.  No AAA or renal colic on CT. I discussed results of today's workup with the patient as well as plan for discharge home with PCP follow-up.  In regards to her renal function, her creatinine is 2.14 EGFR is 27; creatinine is relatively stable from 2 months ago.  Also noted her potassium was elevated today at 5.6, but she has no peaked T waves on EKG.  She was given [facility] for this.  I advised patient to follow-up with her transplant clinic this week for repeat chemistry panel to have her potassium and kidney function reevaluated.  She is continuing to make urine without difficulty at home and has provided urine sample here, negative for UTI.  Unclear the exact etiology of her abdominal discomfort but considering reassuring CT abdomen pelvis/renal US and she feels improved and is able to tolerate p.o. intake, feel discharge is reasonable.  Discussed strict return precautions and patient is agreeable.         Portions of this record have been created using Scientist, clinical (histocompatibility and immunogenetics). Dictation errors have been sought, but may not have been identified and corrected.      The case was discussed with the attending physician who is in agreement with the above assessment and plan.     ____________________________________________         History        Reason for Visit  Abdominal Pain      HPI: as above.     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    Hypertension     Seizure (CMS-HCC) none since 2011    secondary to CVA    Seizure disorder (CMS-HCC) 03/24/2014       Patient Active Problem List   Diagnosis    Hypertension    ESRD (end stage renal disease) (CMS-HCC)    GERD (gastroesophageal reflux disease)    Seizure disorder (CMS-HCC)    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)    Kidney transplanted    Ductal carcinoma in situ (DCIS) of left breast    Diabetes mellitus type 2 without retinopathy (CMS-HCC)    Hypertensive retinopathy of both eyes    Hx of arterial ischemic stroke    Age-related nuclear cataract of both eyes    Hyperopia with astigmatism and presbyopia, bilateral       Past Surgical History:   Procedure Laterality Date    BREAST BIOPSY Left  [id number]    BREAST LUMPECTOMY Left 09/27/2015    DCIS    PR MASTECTOMY, PARTIAL Left 09/24/2015    Procedure: MASTECTOMY, PARTIAL (EG, LUMPECTOMY, TYLECTOMY, QUADRANTECTOMY, SEGMENTECTOMY);  Surgeon: Talbert Cage, DO;  Location: ASC OR [facility];  Service: Surgical Oncology    PR MASTECTOMY, PARTIAL Left 10/25/2015    Procedure: MASTECTOMY, PARTIAL (EG, LUMPECTOMY, TYLECTOMY, QUADRANTECTOMY, SEGMENTECTOMY);  Surgeon: Talbert Cage, DO;  Location: MAIN OR [facility];  Service: Surgical Oncology    PR TRANSPLANTATION OF KIDNEY Left 03/24/2014    Procedure: RENAL ALLOTRANSPLANTATION, IMPLANTATION OF GRAFT; WITHOUT RECIPIENT NEPHRECTOMY;  Surgeon: Purcell Mouton, MD;  Location: MAIN OR [facility];  Service: Transplant    RADIATION      ended 02/2016       No current facility-administered medications for this encounter.    Current Outpatient Medications:     aspirin 81 MG chewable tablet, Chew 1 tablet (81 mg total) daily., Disp: 30 tablet, Rfl: 11    blood sugar diagnostic (ONETOUCH ULTRA TEST) Strp, by Other route every four (4) hours., Disp: 700 strip, Rfl: 11    blood-glucose meter kit, Use as instructed, Disp: 1 each, Rfl: 0    carvediloL (COREG) 12.5 MG tablet, Take 1 tablet (12.5 mg total) by mouth two (2) times a day. TAKE 1 TABLET (12.5 MG TOTAL) BY MOUTH TWO (2) TIMES A DAY., Disp: 60 tablet, Rfl: 11    cetirizine (ZYRTEC) 5 MG chewable tablet, Chew 1 tablet (5 mg total) daily., Disp: 30 tablet, Rfl: 11 cholecalciferol, vitamin D3-25 mcg, 1,000 unit,, (VITAMIN D3) 25 mcg (1,000 unit) capsule, Take 1 capsule (25 mcg total) by mouth daily., Disp: 30 capsule, Rfl: 11    fluticasone propionate (FLONASE) 50 mcg/actuation nasal spray, Two sprays each nare twice a day, Disp: 16 g, Rfl: 3    gabapentin (NEURONTIN) 300 MG capsule, Take 1 capsule (300 mg ) by mouth in the morning and at noon, 2 capsules (600 mg) in the evening, Disp: 360 capsule, Rfl: 2    glipiZIDE (GLUCOTROL) 5 MG tablet, Take 1 tablet (5 mg total) by mouth daily., Disp: 30 tablet, Rfl: 11    levETIRAcetam (KEPPRA) 500 MG tablet, Take 1 tablet (500 mg total) by mouth Two (2) times a day., Disp: 60 tablet, Rfl: 0    levETIRAcetam (KEPPRA) 500 MG tablet, Take 1 tablet (500 mg total) by mouth Two (2) times a day., Disp: 60 tablet, Rfl: 0    levETIRAcetam (KEPPRA) 500 MG tablet, Take 1 tablet (500 mg total) by mouth two (2) times a day., Disp: 60 tablet, Rfl: 0    levETIRAcetam (KEPPRA) 500 MG tablet, Take 1 tablet (500 mg total) by mouth two (2) times a day., Disp: 60 tablet, Rfl: 11    lidocaine (LIDODERM) 5 % patch, Place 1 patch on the skin every twelve (12) hours as needed. Apply to affected area for 12 hours only each day (then remove patch), Disp: 30 patch, Rfl: 0    magnesium oxide (MAG-OX) 400 mg (241.3 mg elemental magnesium) tablet, Take 1 tablet (400 mg total) by mouth daily., Disp: 30 tablet, Rfl: 11    miscellaneous medical supply Misc, 1 knee brace, Disp: 1 each, Rfl: 0    mycophenolate (MYFORTIC) 180 MG EC tablet, Take 2 tablets (360 mg total) by mouth Three (3) times a day., Disp: 180 tablet, Rfl: 11    omeprazole (PRILOSEC) 20 MG capsule, Take 1 capsule (20 mg total) by mouth daily. prn, Disp: 30 capsule, Rfl:  11    sodium bicarbonate 650 mg tablet, Take 2 tablets (1300 mg) three times a day., Disp: 180 tablet, Rfl: 11    tacrolimus (PROGRAF) 1 MG capsule, Take 4 capsules (4 mg total) by mouth two (2) times a day., Disp: 240 capsule, Rfl: 11    tamoxifen (NOLVADEX) 20 MG tablet, Take 1 tablet (20 mg total) by mouth daily., Disp: 90 tablet, Rfl: 0    traMADoL (ULTRAM) 50 mg tablet, Take 1 tablet (50 mg total) by mouth every eight (8) hours as needed for pain., Disp: 60 tablet, Rfl: 2    valACYclovir (VALTREX) 500 MG tablet, Take 1 tablet (500 mg total) by mouth daily., Disp: 30 tablet, Rfl: 11    Allergies  Heparin analogues, Ibuprofen, and Penicillins    Family History   Problem Relation Age of Onset    Breast cancer Mother 61    Kidney failure Sister     Heart failure Maternal Grandmother     Diabetes Maternal Grandmother     Diabetes Maternal Grandfather     Stroke Maternal Grandfather     Cancer Paternal Grandmother     Stroke Paternal Grandfather     Anesthesia problems Neg Hx     Colon cancer Neg Hx     Endometrial cancer Neg Hx     Ovarian cancer Neg Hx        Social History  Social History     Tobacco Use    Smoking status: Former     Current packs/day: 0.00     Average packs/day: 0.5 packs/day for 26.0 years (13.0 ttl pk-yrs)     Types: Cigarettes     Start date: 03/24/1985     Quit date: 03/25/2011     Years since quitting: 11.5    Smokeless tobacco: Never   Substance Use Topics    Alcohol use: Yes     Alcohol/week: 2.0 standard drinks of alcohol     Types: 1 Glasses of wine, 1 Shots of liquor per week     Comment: rare    Drug use: No     Types: Cocaine, Marijuana     Comment: quit 2000          Physical Exam     ED Triage Vitals   Enc Vitals Group      BP 10/11/22 1714 105/79      Heart Rate 10/11/22 1709 88      SpO2 Pulse 10/11/22 1714 90      Resp 10/11/22 1714 21      Temp 10/11/22 1714 37.1 ??C (98.8 ??F)      Temp Source 10/11/22 1714 Oral      SpO2 10/11/22 1709 98 %      Weight 10/11/22 1714 74.7 kg (164 lb 10.9 oz)      Height 10/11/22 1714 1.651 m (5' 5)      Head Circumference --       Peak Flow --       Pain Score --       Pain Loc --       Pain Edu? --        Constitutional: Well appearing, no acute distress.   HEENT: [place]/AT. EOMI. Normal conjunctiva. MMM. No stridor.  Cardiovascular: RRR without m/g/r. 2+ radial pulses bilaterally. No pedal edema.   Respiratory: Normal respiratory effort. Lungs CTAB.   Gastrointestinal: Abdomen soft, tenderness to palpation in the suprapubic abdomen and right lower quadrant without rebound or guarding. Transplanted  kidney in RLQ  No CVA tenderness.  Musculoskeletal: Extremities warm and well perfused. No long bone deformities.  Neurologic: Awake, alert, eyes open. No facial droop. No slurred speech. No aphasia. Moves all extremities spontaneously. Follows commands.   Skin: Skin is warm, dry. No rash noted.  Psychiatric: Mood and affect are normal. Speech and behavior are normal.        Labs     Results for orders placed or performed during the hospital encounter of 10/11/22   Comprehensive Metabolic Panel   Result Value Ref Range    Sodium 138 135 - 145 mmol/L    Potassium 5.6 (H) 3.4 - 4.8 mmol/L    Chloride 110 (H) 98 - 107 mmol/L    CO2 21.0 20.0 - 31.0 mmol/L    Anion Gap 7 5 - 14 mmol/L    BUN 40 (H) 9 - 23 mg/dL    Creatinine 1.61 (H) 0.55 - 1.02 mg/dL    BUN/Creatinine Ratio 19     eGFR CKD-EPI (2021) Female 27 (L) >=60 mL/min/1.11m2    Glucose 126 70 - 179 mg/dL    Calcium 09.6 8.7 - 04.5 mg/dL    Albumin 3.8 3.4 - 5.0 g/dL    Total Protein 7.7 5.7 - 8.2 g/dL    Total Bilirubin 0.3 0.3 - 1.2 mg/dL    AST 13 <=40 U/L    ALT 9 (L) 10 - 49 U/L    Alkaline Phosphatase 63 46 - 116 U/L   CBC w/ Differential   Result Value Ref Range    WBC 7.7 3.6 - 11.2 10*9/L    RBC 4.36 3.95 - 5.13 10*12/L    HGB 13.3 11.3 - 14.9 g/dL    HCT 98.1 19.1 - 47.8 %    MCV 92.2 77.6 - 95.7 fL    MCH 30.6 25.9 - 32.4 pg    MCHC 33.2 32.0 - 36.0 g/dL    RDW 29.5 62.1 - 30.8 %    MPV 8.5 6.8 - 10.7 fL    Platelet 245 150 - 450 10*9/L    Neutrophils % 74.1 %    Lymphocytes % 18.9 %    Monocytes % 4.6 %    Eosinophils % 1.5 %    Basophils % 0.9 %    Absolute Neutrophils 5.7 1.8 - 7.8 10*9/L    Absolute Lymphocytes 1.5 1.1 - 3.6 10*9/L    Absolute Monocytes 0.4 0.3 - 0.8 10*9/L    Absolute Eosinophils 0.1 0.0 - 0.5 10*9/L    Absolute Basophils 0.1 0.0 - 0.1 10*9/L        Radiology     No orders to display                Kendra Woolford, Dustin Folks, MD  Resident  10/13/22 0430

## 2022-10-12 NOTE — Unmapped (Signed)
Patient coming in for Right sided abdominal pain. Kidney transplant patient

## 2022-10-13 NOTE — Unmapped (Addendum)
Urine Culture  Order: [id number] - Reflex for Order [id number]  Status: Final result      Clean Catch; Urine  Urine Culture, Comprehensive   10,000 to 50,000 CFU/mL Coagulase negative Staphylococcus species     Patient with a positive urine culture. Patient was not started on antibiotics prior to dispo from ED. EMAP messaged with results.    10/14/2022 1321: Per Dr. Dimple Casey, no further action needed.

## 2022-10-16 ENCOUNTER — Other Ambulatory Visit
Admission: RE | Admit: 2022-10-16 | Discharge: 2022-10-16 | Disposition: A | Discharge status: Home / Self Care | Payer: 59 | Source: Ambulatory Visit | Attending: Nephrology | Admitting: Nephrology

## 2022-10-16 DIAGNOSIS — D899 Disorder involving the immune mechanism, unspecified: Secondary | ICD-10-CM | POA: Diagnosis not present

## 2022-10-16 DIAGNOSIS — Z940 Kidney transplant status: Secondary | ICD-10-CM | POA: Diagnosis present

## 2022-10-16 DIAGNOSIS — D631 Anemia in chronic kidney disease: Secondary | ICD-10-CM | POA: Diagnosis not present

## 2022-10-16 DIAGNOSIS — E1129 Type 2 diabetes mellitus with other diabetic kidney complication: Secondary | ICD-10-CM | POA: Diagnosis not present

## 2022-10-16 DIAGNOSIS — N189 Chronic kidney disease, unspecified: Secondary | ICD-10-CM | POA: Insufficient documentation

## 2022-10-16 DIAGNOSIS — Z114 Encounter for screening for human immunodeficiency virus [HIV]: Secondary | ICD-10-CM | POA: Insufficient documentation

## 2022-10-16 DIAGNOSIS — N390 Urinary tract infection, site not specified: Secondary | ICD-10-CM | POA: Diagnosis not present

## 2022-10-16 DIAGNOSIS — Z79899 Other long term (current) drug therapy: Secondary | ICD-10-CM | POA: Insufficient documentation

## 2022-10-16 DIAGNOSIS — E559 Vitamin D deficiency, unspecified: Secondary | ICD-10-CM | POA: Diagnosis not present

## 2022-10-16 DIAGNOSIS — Z789 Other specified health status: Secondary | ICD-10-CM | POA: Insufficient documentation

## 2022-10-16 LAB — CBC WITH DIFFERENTIAL/PLATELET
Abs Immature Granulocytes: 0.02 10*3/uL (ref 0.00–0.07)
Basophils Absolute: 0 10*3/uL (ref 0.0–0.1)
Basophils Relative: 1 %
Eosinophils Absolute: 0.1 10*3/uL (ref 0.0–0.5)
Eosinophils Relative: 2 %
HCT: 36.3 % (ref 36.0–46.0)
Hemoglobin: 11.5 g/dL — ABNORMAL LOW (ref 12.0–15.0)
Immature Granulocytes: 0 %
Lymphocytes Relative: 27 %
Lymphs Abs: 1.8 10*3/uL (ref 0.7–4.0)
MCH: 30.4 pg (ref 26.0–34.0)
MCHC: 31.7 g/dL (ref 30.0–36.0)
MCV: 96 fL (ref 80.0–100.0)
Monocytes Absolute: 0.5 10*3/uL (ref 0.1–1.0)
Monocytes Relative: 7 %
Neutro Abs: 4.1 10*3/uL (ref 1.7–7.7)
Neutrophils Relative %: 63 %
Platelets: 229 10*3/uL (ref 150–400)
RBC: 3.78 MIL/uL — ABNORMAL LOW (ref 3.87–5.11)
RDW: 12.9 % (ref 11.5–15.5)
WBC: 6.5 10*3/uL (ref 4.0–10.5)
nRBC: 0 % (ref 0.0–0.2)

## 2022-10-16 LAB — BASIC METABOLIC PANEL
Anion gap: 9 (ref 5–15)
BUN: 42 mg/dL — ABNORMAL HIGH (ref 6–20)
CO2: 18 mmol/L — ABNORMAL LOW (ref 22–32)
Calcium: 10 mg/dL (ref 8.9–10.3)
Chloride: 110 mmol/L (ref 98–111)
Creatinine, Ser: 2.3 mg/dL — ABNORMAL HIGH (ref 0.44–1.00)
GFR, Estimated: 24 mL/min — ABNORMAL LOW (ref >60)
Glucose, Bld: 125 mg/dL — ABNORMAL HIGH (ref 70–99)
Potassium: 4.7 mmol/L (ref 3.5–5.1)
Sodium: 137 mmol/L (ref 135–145)

## 2022-10-16 LAB — MAGNESIUM: Magnesium: 1.9 mg/dL (ref 1.7–2.4)

## 2022-10-16 LAB — PHOSPHORUS: Phosphorus: 4 mg/dL (ref 2.5–4.6)

## 2022-10-17 LAB — TACROLIMUS LEVEL: Tacrolimus (FK506) - LabCorp: 7.6 ng/mL (ref 2.0–20.0)

## 2022-11-05 MED FILL — TACROLIMUS 1 MG CAPSULE, IMMEDIATE-RELEASE: ORAL | 30 days supply | Qty: 240 | Fill #8

## 2022-11-05 MED FILL — MYCOPHENOLATE SODIUM 180 MG TABLET,DELAYED RELEASE: ORAL | 30 days supply | Qty: 180 | Fill #8

## 2022-11-05 NOTE — Unmapped (Signed)
[facility] Refill Coordination Note    Specialty Medication(s) to be Shipped:   Transplant: mycophenolate mofetil 180 EC mg and tacrolimus 1mg     Other medication(s) to be shipped: No additional medications requested for fill at this time     Nancy Bowman, DOB: [birth year]  Phone: [phone] (home)       All above HIPAA information was verified with patient.     Was a Nurse, learning disability used for this call? No    Completed refill call assessment today to schedule patient's medication shipment from the [facility] [phone]).  All relevant notes have been reviewed.     Specialty medication(s) and dose(s) confirmed: Regimen is correct and unchanged.   Changes to medications: Nancy Bowman reports no changes at this time.  Changes to insurance: No  New side effects reported not previously addressed with a pharmacist or physician: None reported  Questions for the pharmacist: No    Confirmed patient received a Conservation officer, historic buildings and a Surveyor, mining with first shipment. The patient will receive a drug information handout for each medication shipped and additional FDA Medication Guides as required.       DISEASE/MEDICATION-SPECIFIC INFORMATION        N/A    SPECIALTY MEDICATION ADHERENCE     Medication Adherence    Specialty Medication: mycophenolate 180 MG EC tablet (MYFORTIC)  Patient is on additional specialty medications: Yes  Additional Specialty Medications: tacrolimus 1 MG capsule (PROGRAF)  Patient is on more than two specialty medications: No  Adherence tools used: patient uses a pill box to manage medications              Were doses missed due to medication being on hold? No    mycophenolate 180 MG EC tablet (MYFORTIC)  : 2 days of medicine on hand   tacrolimus 1 MG capsule (PROGRAF)  : 2 days of medicine on hand       REFERRAL TO PHARMACIST     Referral to the pharmacist: Not needed      [facility]     Shipping address confirmed in Epic.     Patient was notified of new phone menu : No    Delivery Scheduled: Yes, Expected medication delivery date: 11/06/22.     Medication will be delivered via UPS to the prescription address in Epic WAM.    Nancy Bowman' W [name] Shared [facility] Specialty Technician

## 2022-11-11 DIAGNOSIS — N186 End stage renal disease: Principal | ICD-10-CM

## 2022-11-11 DIAGNOSIS — Z940 Kidney transplant status: Principal | ICD-10-CM

## 2022-11-27 ENCOUNTER — Ambulatory Visit: Admit: 2022-11-27 | Payer: MEDICARE

## 2022-11-27 ENCOUNTER — Ambulatory Visit: Admit: 2022-11-27 | Payer: MEDICARE | Attending: Nephrology | Primary: Nephrology

## 2022-12-01 NOTE — Unmapped (Signed)
[facility] Refill Coordination Note    Specialty Medication(s) to be Shipped:   Transplant: mycophenolate mofetil 180 ec mg and tacrolimus 1mg     Other medication(s) to be shipped: No additional medications requested for fill at this time     Nancy Bowman, DOB: [birth year]  Phone: [phone] (home)       All above HIPAA information was verified with patient.     Was a Nurse, learning disability used for this call? No    Completed refill call assessment today to schedule patient's medication shipment from the [facility] [phone]).  All relevant notes have been reviewed.     Specialty medication(s) and dose(s) confirmed: Regimen is correct and unchanged.   Changes to medications: Nancy Bowman reports no changes at this time.  Changes to insurance: No  New side effects reported not previously addressed with a pharmacist or physician: None reported  Questions for the pharmacist: No    Confirmed patient received a Conservation officer, historic buildings and a Surveyor, mining with first shipment. The patient will receive a drug information handout for each medication shipped and additional FDA Medication Guides as required.       DISEASE/MEDICATION-SPECIFIC INFORMATION        N/A    SPECIALTY MEDICATION ADHERENCE     Medication Adherence    Patient reported X missed doses in the last month: 0  Specialty Medication: mycophenolate 180 MG EC tablet (MYFORTIC)  Patient is on additional specialty medications: Yes  Additional Specialty Medications: tacrolimus 1 MG capsule (PROGRAF)  Patient Reported Additional Medication X Missed Doses in the Last Month: 0  Patient is on more than two specialty medications: No  Any gaps in refill history greater than 2 weeks in the last 3 months: no  Demonstrates understanding of importance of adherence: yes  Informant: patient  Reliability of informant: reliable  Provider-estimated medication adherence level: good  Patient is at risk for Non-Adherence: No  Reasons for non-adherence: no problems identified  Adherence tools used: patient uses a pill box to manage medications              Were doses missed due to medication being on hold? No    mycophenolate 180 MG EC tablet (MYFORTIC)  : 7 days of medicine on hand   tacrolimus 1 MG capsule (PROGRAF)  : 7 days of medicine on hand       REFERRAL TO PHARMACIST     Referral to the pharmacist: Not needed      [facility]     Shipping address confirmed in Epic.       Delivery Scheduled: Yes, Expected medication delivery date: 12/07/22.     Medication will be delivered via Same Day Courier to the prescription address in Epic WAM.    Nancy Sheek' Nancy Bowman Shared [facility] Specialty Technician

## 2022-12-04 NOTE — Unmapped (Signed)
Called patient to schedule annual visit. Voicemail full, text was sent.

## 2022-12-07 MED FILL — TACROLIMUS 1 MG CAPSULE, IMMEDIATE-RELEASE: ORAL | 30 days supply | Qty: 240 | Fill #9

## 2022-12-07 MED FILL — MYCOPHENOLATE SODIUM 180 MG TABLET,DELAYED RELEASE: ORAL | 30 days supply | Qty: 180 | Fill #9

## 2022-12-14 NOTE — Unmapped (Signed)
Patient called to schedule annual visit. Voicemail full, text message was sent. MyChart message was not sent. Last log-in was 2016.

## 2023-01-06 DIAGNOSIS — Z79899 Other long term (current) drug therapy: Principal | ICD-10-CM

## 2023-01-06 DIAGNOSIS — Z940 Kidney transplant status: Principal | ICD-10-CM

## 2023-01-06 MED ORDER — FLUTICASONE PROPIONATE 50 MCG/ACTUATION NASAL SPRAY,SUSPENSION
0 refills | 0 days
Start: 2023-01-06 — End: ?

## 2023-01-08 MED ORDER — FLUTICASONE PROPIONATE 50 MCG/ACTUATION NASAL SPRAY,SUSPENSION
0 refills | 0 days | Status: CP
Start: 2023-01-08 — End: ?

## 2023-01-20 DIAGNOSIS — Z940 Kidney transplant status: Principal | ICD-10-CM

## 2023-01-20 MED ORDER — MYCOPHENOLATE SODIUM 180 MG TABLET,DELAYED RELEASE
ORAL_TABLET | Freq: Three times a day (TID) | ORAL | 11 refills | 30 days | Status: CP
Start: 2023-01-20 — End: 2024-01-20
  Filled 2023-02-09: qty 180, 30d supply, fill #0

## 2023-01-20 MED ORDER — TACROLIMUS 1 MG CAPSULE, IMMEDIATE-RELEASE
ORAL_CAPSULE | Freq: Two times a day (BID) | ORAL | 11 refills | 30 days | Status: CP
Start: 2023-01-20 — End: 2024-01-20
  Filled 2023-02-09: qty 240, 30d supply, fill #0

## 2023-01-20 NOTE — Unmapped (Signed)
Pt request for RX Refill

## 2023-02-08 NOTE — Unmapped (Signed)
[facility] Refill Coordination Note    Specialty Medication(s) to be Shipped:   Transplant: mycophenolate mofetil 180 EC mg and tacrolimus 1mg     Other medication(s) to be shipped: No additional medications requested for fill at this time     Nancy Bowman, DOB: 10-Sep-1965  Phone: [phone] (home)       All above HIPAA information was verified with patient.     Was a Nurse, learning disability used for this call? No    Completed refill call assessment today to schedule patient's medication shipment from the [facility] [phone]).  All relevant notes have been reviewed.     Specialty medication(s) and dose(s) confirmed: Regimen is correct and unchanged.   Changes to medications: Nancy Bowman reports no changes at this time.  Changes to insurance: No  New side effects reported not previously addressed with a pharmacist or physician: None reported  Questions for the pharmacist: No    Confirmed patient received a Conservation officer, historic buildings and a Surveyor, mining with first shipment. The patient will receive a drug information handout for each medication shipped and additional FDA Medication Guides as required.       DISEASE/MEDICATION-SPECIFIC INFORMATION        N/A    SPECIALTY MEDICATION ADHERENCE     Medication Adherence    Patient reported X missed doses in the last month: 0  Specialty Medication: tacrolimus 1 MG capsule (PROGRAF)  Patient is on additional specialty medications: Yes  Additional Specialty Medications: mycophenolate 180 MG EC tablet (MYFORTIC)  Patient Reported Additional Medication X Missed Doses in the Last Month: 0  Patient is on more than two specialty medications: No  Any gaps in refill history greater than 2 weeks in the last 3 months: no  Demonstrates understanding of importance of adherence: yes  Informant: patient  Reliability of informant: reliable  Provider-estimated medication adherence level: good  Patient is at risk for Non-Adherence: No  Reasons for non-adherence: no problems identified  Adherence tools used: patient uses a pill box to manage medications              Were doses missed due to medication being on hold? No    mycophenolate 180 MG EC tablet (MYFORTIC)  : 1 days of medicine on hand   tacrolimus 1 MG capsule (PROGRAF)  : 1 days of medicine on hand       REFERRAL TO PHARMACIST     Referral to the pharmacist: Not needed      [facility]     Shipping address confirmed in Epic.       Delivery Scheduled: Yes, Expected medication delivery date: 02/09/23.     Medication will be delivered via Same Day Courier to the prescription address in Epic WAM.    Nancy Bowman' W Nancy Bowman Shared [facility] Specialty Technician

## 2023-02-24 NOTE — Unmapped (Signed)
Called patient to schedule annual visit. Patient stated she had a sinus infection and requested a call back in a few days to schedule.Recall created.

## 2023-03-06 DIAGNOSIS — S0181XA Laceration without foreign body of other part of head, initial encounter: Secondary | ICD-10-CM | POA: Insufficient documentation

## 2023-03-06 DIAGNOSIS — Y92410 Unspecified street and highway as the place of occurrence of the external cause: Secondary | ICD-10-CM | POA: Insufficient documentation
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
## 2023-03-07 ENCOUNTER — Emergency Department
Admission: EM | Admit: 2023-03-07 | Discharge: 2023-03-07 | Disposition: A | Discharge status: Home / Self Care | Payer: 59 | Attending: Emergency Medicine | Admitting: Emergency Medicine

## 2023-03-07 ENCOUNTER — Other Ambulatory Visit: Payer: Self-pay

## 2023-03-07 ENCOUNTER — Emergency Department: Payer: 59

## 2023-03-07 DIAGNOSIS — S0181XA Laceration without foreign body of other part of head, initial encounter: Secondary | ICD-10-CM

## 2023-03-07 MED ORDER — ACETAMINOPHEN 500 MG PO TABS
1000.0000 mg | ORAL_TABLET | Freq: Once | ORAL | Status: AC
  Administered 2023-03-07: 1000 mg via ORAL
  Filled 2023-03-07: qty 2

## 2023-03-07 MED ORDER — LIDOCAINE HCL (PF) 1 % IJ SOLN
10.0000 mL | Freq: Once | INTRAMUSCULAR | Status: AC
  Administered 2023-03-07: 10 mL
  Filled 2023-03-07: qty 10

## 2023-03-07 MED ORDER — OXYCODONE HCL 5 MG PO TABS
5.0000 mg | ORAL_TABLET | Freq: Once | ORAL | Status: AC
  Administered 2023-03-07: 5 mg via ORAL
  Filled 2023-03-07: qty 1
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
## 2023-03-07 NOTE — Discharge Instructions (Addendum)
We placed 3 stitches that will need to be removed after 7-10 days.   Use Tylenol for pain and fevers.  Up to 1000 mg per dose, up to 4 times per day.  Do not take more than 4000 mg of Tylenol/acetaminophen within 24 hours..  Gently wash the wound with soap and water.  It is okay to shower, but do not submerge in a bath or go swimming as it is healing.  Do not vigorously scrub.   Gently pat dry.   Once dry, then apply Neosporin or bacitracin or even Vaseline ointment to the area to act as a barrier to help prevent infection.
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
## 2023-03-15 NOTE — Unmapped (Signed)
The [facility] has made a third and final attempt to reach this patient to refill the following medication:mycophenolate, tacrolimus.      We have been unable to leave messages on the following phone numbers: [phone] and have sent a MyChart message.    Dates contacted: 8/5, 8/8, 8/12  Last scheduled delivery: 7/9    The patient may be at risk of non-compliance with this medication. The patient should call the [facility] at [phone]  Option 4, then Option 4: Infectious Disease, Transplant to refill medication.    Thad Ranger, PharmD   [facility] Specialty Pharmacist

## 2023-03-15 NOTE — Unmapped (Signed)
[facility] Clinical Assessment & Refill Coordination Note    Nancy Bowman, DOB: [birth year]  Phone: [phone] (home)     All above HIPAA information was verified with patient.     Was a Nurse, learning disability used for this call? No    Specialty Medication(s):   Transplant:  mycophenolic acid 180mg  and tacrolimus 1mg      Current Outpatient Medications   Medication Sig Dispense Refill    aspirin 81 MG chewable tablet Chew 1 tablet (81 mg total) daily. 30 tablet 11    blood sugar diagnostic (ONETOUCH ULTRA TEST) Strp by Other route every four (4) hours. 700 strip 11    blood-glucose meter kit Use as instructed 1 each 0    carvediloL (COREG) 12.5 MG tablet Take 1 tablet (12.5 mg total) by mouth two (2) times a day. TAKE 1 TABLET (12.5 MG TOTAL) BY MOUTH TWO (2) TIMES A DAY. 60 tablet 11    cetirizine (ZYRTEC) 5 MG chewable tablet Chew 1 tablet (5 mg total) daily. 30 tablet 11    cholecalciferol, vitamin D3-25 mcg, 1,000 unit,, (VITAMIN D3) 25 mcg (1,000 unit) capsule Take 1 capsule (25 mcg total) by mouth daily. 30 capsule 11    fluticasone propionate (FLONASE) 50 mcg/actuation nasal spray Two sprays each nare twice a day 16 g 0    gabapentin (NEURONTIN) 300 MG capsule Take 1 capsule (300 mg ) by mouth in the morning and at noon, 2 capsules (600 mg) in the evening 360 capsule 2    glipiZIDE (GLUCOTROL) 5 MG tablet Take 1 tablet (5 mg total) by mouth daily. 30 tablet 11    levETIRAcetam (KEPPRA) 500 MG tablet Take 1 tablet (500 mg total) by mouth Two (2) times a day. 60 tablet 0    levETIRAcetam (KEPPRA) 500 MG tablet Take 1 tablet (500 mg total) by mouth Two (2) times a day. 60 tablet 0    levETIRAcetam (KEPPRA) 500 MG tablet Take 1 tablet (500 mg total) by mouth two (2) times a day. 60 tablet 0    levETIRAcetam (KEPPRA) 500 MG tablet Take 1 tablet (500 mg total) by mouth two (2) times a day. 60 tablet 11    magnesium oxide (MAG-OX) 400 mg (241.3 mg elemental magnesium) tablet Take 1 tablet (400 mg total) by mouth daily. 30 tablet 11    miscellaneous medical supply Misc 1 knee brace 1 each 0    mycophenolate (MYFORTIC) 180 MG EC tablet Take 2 tablets (360 mg total) by mouth Three (3) times a day. 180 tablet 11    omeprazole (PRILOSEC) 20 MG capsule Take 1 capsule (20 mg total) by mouth daily. prn 30 capsule 11    sodium bicarbonate 650 mg tablet Take 2 tablets (1300 mg) three times a day. 180 tablet 11    tacrolimus (PROGRAF) 1 MG capsule Take 4 capsules (4 mg total) by mouth two (2) times a day. 240 capsule 11    tamoxifen (NOLVADEX) 20 MG tablet Take 1 tablet (20 mg total) by mouth daily. 90 tablet 0    traMADoL (ULTRAM) 50 mg tablet Take 1 tablet (50 mg total) by mouth every eight (8) hours as needed for pain. 60 tablet 2    valACYclovir (VALTREX) 500 MG tablet Take 1 tablet (500 mg total) by mouth daily. 30 tablet 11     No current facility-administered medications for this visit.        Changes to medications: Dany reports no changes at this  time.    Allergies   Allergen Reactions    Heparin Analogues Other (See Comments)     Causing HITT    Ibuprofen      Can't take due to kidneys    Penicillins      Patient is unsure if she's allergic, was told she had a reaction as a child but thinks she's taking a cillin while sick without having a reaction.  Pt states she has taken amoxicillin in the past and did not have problem       Changes to allergies: No    SPECIALTY MEDICATION ADHERENCE     mycophenolate 180 mg: 5 doses of medicine on hand (10 tablets left: 2 tablets for tonight, 03/15/23, 6 tablets for tomorrow, 03/16/23, and then 2 tablets for the morning of 03/17/23)  tacrolimus 1 mg: 1 dose of medicine on hand (4 capsules for dose tonight)    Medication Adherence    Patient reported X missed doses in the last month: 0  Specialty Medication: mycophenolate 180mg  EC tablets  Patient is on additional specialty medications: Yes  Additional Specialty Medications: tacrolimus 1mg   Patient Reported Additional Medication X Missed Doses in the Last Month: 0  Patient is on more than two specialty medications: No  Any gaps in refill history greater than 2 weeks in the last 3 months: yes  Demonstrates understanding of importance of adherence: yes  Informant: patient  Provider-estimated medication adherence level: good  Patient is at risk for Non-Adherence: No  Adherence tools used: patient uses a pill box to manage medications          Specialty medication(s) dose(s) confirmed: Regimen is correct and unchanged.     Are there any concerns with adherence? No    Adherence counseling provided? Not needed    CLINICAL MANAGEMENT AND INTERVENTION      Clinical Benefit Assessment:    Do you feel the medicine is effective or helping your condition? Yes    Clinical Benefit counseling provided? Not needed    Adverse Effects Assessment:    Are you experiencing any side effects? No    Are you experiencing difficulty administering your medicine? No    Quality of Life Assessment:    How many days over the past month did your Kidney Transplant  keep you from your normal activities? For example, brushing your teeth or getting up in the morning. 0    Have you discussed this with your provider? Not needed    Acute Infection Status:    Acute infections noted within Epic:  No active infections  Patient reported infection: None    Therapy Appropriateness:    Is therapy appropriate based on current medication list, adverse reactions, adherence, clinical benefit and progress toward achieving therapeutic goals? Yes, therapy is appropriate and should be continued     DISEASE/MEDICATION-SPECIFIC INFORMATION      N/A    Solid Organ Transplant: Not Applicable    PATIENT SPECIFIC NEEDS     Does the patient have any physical, cognitive, or cultural barriers? No    Is the patient high risk? Yes, patient is taking a REMS drug. Medication is dispensed in compliance with REMS program    Did the patient require a clinical intervention? No    Does the patient require physician intervention or other additional services (i.e., nutrition, smoking cessation, social work)? No    SOCIAL DETERMINANTS OF HEALTH     At the [facility], we have learned that life circumstances -  like trouble affording food, housing, utilities, or transportation can affect the health of many of our patients.   That is why we wanted to ask: are you currently experiencing any life circumstances that are negatively impacting your health and/or quality of life? Patient declined to answer    Social Determinants of Health     Financial Resource Strain: Not on file   Internet Connectivity: Not on file   Food Insecurity: Not on file   Tobacco Use: Medium Risk (05/20/2021)    Received from [facility]    Patient History     Smoking Tobacco Use: Former     Smokeless Tobacco Use: Never     Passive Exposure: Not on file   Housing/Utilities: Not on file   Alcohol Use: Not on file   Transportation Needs: Not on file   Substance Use: Not on file   Health Literacy: Not on file   Physical Activity: Not on file   Interpersonal Safety: Unknown (03/15/2023)    Interpersonal Safety     Unsafe Where You Currently Live: Not on file     Physically Hurt by Anyone: Not on file     Abused by Anyone: Not on file   Stress: Not on file   Intimate Partner Violence: Not on file   Depression: Not on file   Social Connections: Not on file       Would you be willing to receive help with any of the needs that you have identified today? Not applicable       SHIPPING     Specialty Medication(s) to be Shipped:   Transplant:  mycophenolic acid 180mg  and tacrolimus 1mg     Other medication(s) to be shipped: No additional medications requested for fill at this time     Changes to insurance: No    Delivery Scheduled: Yes, Expected medication delivery date: 03/16/23.     Medication will be delivered via Same Day Courier to the confirmed prescription address in [facility].    The patient will receive a drug information handout for each medication shipped and additional FDA Medication Guides as required.  Verified that patient has previously received a Conservation officer, historic buildings and a Surveyor, mining.    The patient or caregiver noted above participated in the development of this care plan and knows that they can request review of or adjustments to the care plan at any time.      All of the patient's questions and concerns have been addressed.    Roderic Palau, PharmD   [facility] Specialty Pharmacist

## 2023-03-16 MED FILL — TACROLIMUS 1 MG CAPSULE, IMMEDIATE-RELEASE: ORAL | 30 days supply | Qty: 240 | Fill #1

## 2023-03-16 MED FILL — MYCOPHENOLATE SODIUM 180 MG TABLET,DELAYED RELEASE: ORAL | 30 days supply | Qty: 180 | Fill #1

## 2023-03-17 ENCOUNTER — Ambulatory Visit
Admit: 2023-03-17 | Discharge: 2023-03-18 | Disposition: A | Discharge status: Home / Self Care | Payer: MEDICARE | Attending: Emergency Medicine

## 2023-03-17 ENCOUNTER — Emergency Department
Admit: 2023-03-17 | Discharge: 2023-03-18 | Disposition: A | Discharge status: Home / Self Care | Payer: MEDICARE | Attending: Emergency Medicine

## 2023-03-17 DIAGNOSIS — N3090 Cystitis, unspecified without hematuria: Principal | ICD-10-CM

## 2023-03-17 DIAGNOSIS — N3001 Acute cystitis with hematuria: Principal | ICD-10-CM

## 2023-03-17 LAB — CBC W/ AUTO DIFF
BASOPHILS ABSOLUTE COUNT: 0.1 10*9/L (ref 0.0–0.1)
BASOPHILS RELATIVE PERCENT: 0.8 %
EOSINOPHILS ABSOLUTE COUNT: 0.1 10*9/L (ref 0.0–0.5)
EOSINOPHILS RELATIVE PERCENT: 1 %
HEMATOCRIT: 38.9 % (ref 34.0–44.0)
HEMOGLOBIN: 12.7 g/dL (ref 11.3–14.9)
LYMPHOCYTES ABSOLUTE COUNT: 1.4 10*9/L (ref 1.1–3.6)
LYMPHOCYTES RELATIVE PERCENT: 19.6 %
MEAN CORPUSCULAR HEMOGLOBIN CONC: 32.6 g/dL (ref 32.0–36.0)
MEAN CORPUSCULAR HEMOGLOBIN: 31.1 pg (ref 25.9–32.4)
MEAN CORPUSCULAR VOLUME: 95.2 fL (ref 77.6–95.7)
MEAN PLATELET VOLUME: 8.5 fL (ref 6.8–10.7)
MONOCYTES ABSOLUTE COUNT: 0.5 10*9/L (ref 0.3–0.8)
MONOCYTES RELATIVE PERCENT: 7.1 %
NEUTROPHILS ABSOLUTE COUNT: 5.1 10*9/L (ref 1.8–7.8)
NEUTROPHILS RELATIVE PERCENT: 71.5 %
PLATELET COUNT: 209 10*9/L (ref 150–450)
RED BLOOD CELL COUNT: 4.08 10*12/L (ref 3.95–5.13)
RED CELL DISTRIBUTION WIDTH: 13.8 % (ref 12.2–15.2)
WBC ADJUSTED: 7.1 10*9/L (ref 3.6–11.2)

## 2023-03-17 LAB — BASIC METABOLIC PANEL
ANION GAP: 6 mmol/L (ref 5–14)
BLOOD UREA NITROGEN: 34 mg/dL — ABNORMAL HIGH (ref 9–23)
BUN / CREAT RATIO: 18
CALCIUM: 10.2 mg/dL (ref 8.7–10.4)
CHLORIDE: 109 mmol/L — ABNORMAL HIGH (ref 98–107)
CO2: 22 mmol/L (ref 20.0–31.0)
CREATININE: 1.9 mg/dL — ABNORMAL HIGH
EGFR CKD-EPI (2021) FEMALE: 31 mL/min/{1.73_m2} — ABNORMAL LOW (ref >=60)
GLUCOSE RANDOM: 89 mg/dL (ref 70–179)
POTASSIUM: 5.3 mmol/L — ABNORMAL HIGH (ref 3.4–4.8)
SODIUM: 137 mmol/L (ref 135–145)

## 2023-03-17 LAB — COMPREHENSIVE METABOLIC PANEL
ALBUMIN: 3.7 g/dL (ref 3.4–5.0)
ALKALINE PHOSPHATASE: 55 U/L (ref 46–116)
ALT (SGPT): 9 U/L — ABNORMAL LOW (ref 10–49)
ANION GAP: 7 mmol/L (ref 5–14)
AST (SGOT): 15 U/L (ref <=34)
BILIRUBIN TOTAL: 0.3 mg/dL (ref 0.3–1.2)
BLOOD UREA NITROGEN: 32 mg/dL — ABNORMAL HIGH (ref 9–23)
BUN / CREAT RATIO: 17
CALCIUM: 10.4 mg/dL (ref 8.7–10.4)
CHLORIDE: 110 mmol/L — ABNORMAL HIGH (ref 98–107)
CO2: 20 mmol/L (ref 20.0–31.0)
CREATININE: 1.89 mg/dL — ABNORMAL HIGH
EGFR CKD-EPI (2021) FEMALE: 31 mL/min/{1.73_m2} — ABNORMAL LOW (ref >=60)
GLUCOSE RANDOM: 88 mg/dL (ref 70–179)
POTASSIUM: 5.3 mmol/L — ABNORMAL HIGH (ref 3.4–4.8)
PROTEIN TOTAL: 7.7 g/dL (ref 5.7–8.2)
SODIUM: 137 mmol/L (ref 135–145)

## 2023-03-17 LAB — URINALYSIS WITH MICROSCOPY WITH CULTURE REFLEX PERFORMABLE
BACTERIA: NONE SEEN /HPF
BILIRUBIN UA: NEGATIVE
GLUCOSE UA: NEGATIVE
KETONES UA: NEGATIVE
NITRITE UA: NEGATIVE
PH UA: 6.5 (ref 5.0–9.0)
PROTEIN UA: 70 — AB
RBC UA: 7 /HPF — ABNORMAL HIGH (ref <=4)
SPECIFIC GRAVITY UA: 1.017 (ref 1.003–1.030)
SQUAMOUS EPITHELIAL: 1 /HPF (ref 0–5)
UROBILINOGEN UA: <2
WBC UA: 49 /HPF — ABNORMAL HIGH (ref 0–5)

## 2023-03-17 LAB — BLOOD GAS CRITICAL CARE PANEL, VENOUS
BASE EXCESS VENOUS: -3.6 — ABNORMAL LOW (ref -2.0–2.0)
CALCIUM IONIZED VENOUS (MG/DL): 5.64 mg/dL — ABNORMAL HIGH (ref 4.40–5.40)
GLUCOSE WHOLE BLOOD: 87 mg/dL (ref 70–179)
HCO3 VENOUS: 22 mmol/L (ref 22–27)
HEMOGLOBIN BLOOD GAS: 13 g/dL (ref 12.00–16.00)
LACTATE BLOOD VENOUS: 1 mmol/L (ref 0.5–1.8)
O2 SATURATION VENOUS: 75.8 % (ref 40.0–85.0)
PCO2 VENOUS: 43 mmHg (ref 40–60)
PH VENOUS: 7.33 (ref 7.32–7.43)
PO2 VENOUS: 44 mmHg (ref 30–55)
POTASSIUM WHOLE BLOOD: 5.1 mmol/L — ABNORMAL HIGH (ref 3.4–4.6)
SODIUM WHOLE BLOOD: 139 mmol/L (ref 135–145)

## 2023-03-17 LAB — HIGH SENSITIVITY TROPONIN I - SINGLE
HIGH SENSITIVITY TROPONIN I: 11 ng/L (ref <=34)
HIGH SENSITIVITY TROPONIN I: 10 ng/L (ref <=34)

## 2023-03-17 MED ORDER — CEFDINIR 300 MG CAPSULE
ORAL_CAPSULE | Freq: Every day | ORAL | 0 refills | 7 days | Status: CP
Start: 2023-03-17 — End: 2023-03-24

## 2023-03-17 MED ORDER — OXYCODONE 5 MG TABLET
ORAL_TABLET | ORAL | 0 refills | 1 days | Status: CP | PRN
Start: 2023-03-17 — End: ?

## 2023-03-17 MED ADMIN — cefTRIAXone (ROCEPHIN) 1 g in sodium chloride 0.9 % (NS) 100 mL IVPB-MBP: 1 g | INTRAVENOUS | Stop: 2023-03-17

## 2023-03-17 MED ADMIN — morphine 4 mg/mL injection 4 mg: 4 mg | INTRAVENOUS | @ 20:00:00 | Stop: 2023-03-17
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
## 2023-03-20 NOTE — Unmapped (Addendum)
Urine Culture  Order: [id number] - Reflex for Order [id number]   Status: Final result      Clean Catch; Urine  Urine Culture, Comprehensive   10,000 to 50,000 CFU/mL Coagulase negative Staphylococcus species     Patient with a positive urine culture. Patient was started on cefdinir 600 mg Oral Daily (standard)prior to dispo from ED. EMAP messaged to verify coverage.    03/22/2023 1327: I attempted to reach patient with no answer. Unable to leave a voicemail due to inbox being full. Text message sent to listed number.

## 2023-03-21 NOTE — Unmapped (Signed)
EMAP test result follow-up note    March 20, 2023 8:07 PM     I was contacted by the ED resource nurse Mariane Duval) with regard to [facility]. She was seen in the [facility] Emergency Department on 03/17/23 at which time a clean catch urine culture was done which has returned positive for:    Urine Culture, Comprehensive 10,000 to 50,000 CFU/mL Coagulase negative Staphylococcus species - Abnormal    Susceptibility Testing By Consultation Only      Specimen Source: Clean Catch        Resulting Agency: [facility]           Specimen Collected: 03/17/23 16:17 Last Resulted: 03/19/23 16:26          I have reviewed the patient's chart from this ED visit. She is a 57 year old female with a history of kidney transplant who was evaluated for hematuria and diagnosed with cystitis. Renal function was at baseline with Cr 1.89. She was treated with a dose of ceftriaxone 1 g IV while in the ED and discharged on a course of cefdinir 600 mg by mouth daily x 7 days. Urine culture has since returned with the findings as documented above. Cefdinir is appropriate coverage for CoNS and no change in therapy is needed.    Because she is a kidney transplant patient, the ED resource nurse will contact the patient, inform her of the positive test result(s) and instruct her to be sure she completes the course of cefdinir prescribed at the time of her ED visit as well as follow-up with her nephrologist. Return precautions should otherwise be reviewed and she should be instructed to follow-up as directed at the time of her ED visit.

## 2023-04-01 NOTE — Unmapped (Signed)
[employer] forms

## 2023-04-09 ENCOUNTER — Emergency Department
Admission: EM | Admit: 2023-04-09 | Discharge: 2023-04-09 | Disposition: A | Discharge status: Home / Self Care | Payer: 59 | Attending: Emergency Medicine | Admitting: Emergency Medicine

## 2023-04-09 ENCOUNTER — Emergency Department: Payer: 59

## 2023-04-09 ENCOUNTER — Other Ambulatory Visit: Payer: Self-pay

## 2023-04-09 DIAGNOSIS — M4692 Unspecified inflammatory spondylopathy, cervical region: Secondary | ICD-10-CM | POA: Diagnosis not present

## 2023-04-09 DIAGNOSIS — N186 End stage renal disease: Secondary | ICD-10-CM | POA: Insufficient documentation

## 2023-04-09 DIAGNOSIS — Z940 Kidney transplant status: Secondary | ICD-10-CM | POA: Insufficient documentation

## 2023-04-09 DIAGNOSIS — S060X0A Concussion without loss of consciousness, initial encounter: Secondary | ICD-10-CM | POA: Insufficient documentation

## 2023-04-09 DIAGNOSIS — G9389 Other specified disorders of brain: Secondary | ICD-10-CM | POA: Diagnosis not present

## 2023-04-09 DIAGNOSIS — M47812 Spondylosis without myelopathy or radiculopathy, cervical region: Secondary | ICD-10-CM | POA: Diagnosis not present

## 2023-04-09 DIAGNOSIS — R251 Tremor, unspecified: Secondary | ICD-10-CM | POA: Insufficient documentation

## 2023-04-09 DIAGNOSIS — S0990XA Unspecified injury of head, initial encounter: Secondary | ICD-10-CM | POA: Diagnosis present

## 2023-04-09 DIAGNOSIS — R519 Headache, unspecified: Secondary | ICD-10-CM

## 2023-04-09 LAB — CBC
HCT: 38.3 % (ref 36.0–46.0)
Hemoglobin: 12.1 g/dL (ref 12.0–15.0)
MCH: 30.7 pg (ref 26.0–34.0)
MCHC: 31.6 g/dL (ref 30.0–36.0)
MCV: 97.2 fL (ref 80.0–100.0)
Platelets: 221 10*3/uL (ref 150–400)
RBC: 3.94 MIL/uL (ref 3.87–5.11)
RDW: 12.9 % (ref 11.5–15.5)
WBC: 7.4 10*3/uL (ref 4.0–10.5)
nRBC: 0 % (ref 0.0–0.2)

## 2023-04-09 LAB — COMPREHENSIVE METABOLIC PANEL
ALT: 14 U/L (ref 0–44)
AST: 16 U/L (ref 15–41)
Albumin: 3.7 g/dL (ref 3.5–5.0)
Alkaline Phosphatase: 39 U/L (ref 38–126)
Anion gap: 8 (ref 5–15)
BUN: 43 mg/dL — ABNORMAL HIGH (ref 6–20)
CO2: 21 mmol/L — ABNORMAL LOW (ref 22–32)
Calcium: 9.8 mg/dL (ref 8.9–10.3)
Chloride: 106 mmol/L (ref 98–111)
Creatinine, Ser: 2.03 mg/dL — ABNORMAL HIGH (ref 0.44–1.00)
GFR, Estimated: 28 mL/min — ABNORMAL LOW (ref >60)
Glucose, Bld: 126 mg/dL — ABNORMAL HIGH (ref 70–99)
Potassium: 4.9 mmol/L (ref 3.5–5.1)
Sodium: 135 mmol/L (ref 135–145)
Total Bilirubin: 0.4 mg/dL (ref 0.3–1.2)
Total Protein: 7.2 g/dL (ref 6.5–8.1)

## 2023-04-09 LAB — TSH: TSH: 0.861 u[IU]/mL (ref 0.350–4.500)

## 2023-04-09 MED ORDER — SODIUM CHLORIDE 0.9 % IV BOLUS
500.0000 mL | Freq: Once | INTRAVENOUS | Status: AC
  Administered 2023-04-09: 500 mL via INTRAVENOUS

## 2023-04-09 MED ORDER — CYCLOBENZAPRINE HCL 10 MG PO TABS: 10.0000 mg | ORAL_TABLET | Freq: Three times a day (TID) | ORAL | 0 refills | Status: DC | PRN

## 2023-04-09 MED ORDER — PROCHLORPERAZINE MALEATE 5 MG PO TABS: 5.0000 mg | ORAL_TABLET | Freq: Four times a day (QID) | ORAL | 0 refills | Status: DC | PRN

## 2023-04-09 MED ORDER — DEXAMETHASONE 6 MG PO TABS
10.0000 mg | ORAL_TABLET | Freq: Once | ORAL | Status: AC
  Administered 2023-04-09: 10 mg via ORAL
  Filled 2023-04-09: qty 1

## 2023-04-09 MED ORDER — HYDROCODONE-ACETAMINOPHEN 5-325 MG PO TABS: 1.0000 | ORAL_TABLET | Freq: Four times a day (QID) | ORAL | 0 refills | Status: DC | PRN

## 2023-04-09 MED ORDER — OXYCODONE-ACETAMINOPHEN 5-325 MG PO TABS
2.0000 | ORAL_TABLET | Freq: Once | ORAL | Status: AC
  Administered 2023-04-09: 2 via ORAL
  Filled 2023-04-09: qty 2

## 2023-04-09 MED ORDER — DIPHENHYDRAMINE HCL 50 MG/ML IJ SOLN: 25.0000 mg | Freq: Once | INTRAMUSCULAR | Status: DC

## 2023-04-09 MED ORDER — LIDOCAINE-PRILOCAINE 2.5-2.5 % EX CREA
TOPICAL_CREAM | Freq: Once | CUTANEOUS | Status: AC
  Filled 2023-04-09: qty 5

## 2023-04-09 MED ORDER — ONDANSETRON 4 MG PO TBDP
4.0000 mg | ORAL_TABLET | Freq: Once | ORAL | Status: AC
  Administered 2023-04-09: 4 mg via ORAL
  Filled 2023-04-09: qty 1

## 2023-04-09 MED ORDER — PROCHLORPERAZINE EDISYLATE 10 MG/2ML IJ SOLN: 10.0000 mg | Freq: Once | INTRAMUSCULAR | Status: DC

## 2023-04-09 NOTE — Discharge Instructions (Addendum)
Call your transplant doctor to discuss your ER visit and tremors  Take the medications as prescribed  Follow-up with Neurology in 1-2 weeks ideally
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
## 2023-04-12 NOTE — Group Note (Deleted)

## 2023-04-13 LAB — TACROLIMUS LEVEL: Tacrolimus (FK506) - LabCorp: 3 ng/mL (ref 2.0–20.0)

## 2023-04-14 LAB — MYCOPHENOLIC ACID (CELLCEPT)
MPA Glucuronide: 62 ug/mL (ref 15–125)
MPA: 7.2 ug/mL — ABNORMAL HIGH (ref 1.0–3.5)

## 2023-04-15 ENCOUNTER — Telehealth: Payer: Self-pay | Admitting: Emergency Medicine

## 2023-04-15 NOTE — Telephone Encounter (Signed)
Called patient to discuss the lab results and to assure that she is going to follow up with her renal doctor regarding these lab results.  No answer and VM is full.

## 2023-04-15 NOTE — Telephone Encounter (Signed)
Tried to call again to encourage follow up with nephrologist.  No answer again.

## 2023-04-19 NOTE — Unmapped (Signed)
The [facility] has made a third and final attempt to reach this patient to refill the following medication:mycophenolate, tacrolimus.      We have left voicemails on the following phone numbers: [phone] and have sent a MyChart message.    Dates contacted: 8/30, 9/9, 9/16  Last scheduled delivery: 03/16/23    The patient may be at risk of non-compliance with this medication. The patient should call the [facility] at [phone]  Option 4, then Option 4: Infectious Disease, Transplant to refill medication.    [name], [facility]   [facility] Specialty Pharmacist

## 2023-04-20 ENCOUNTER — Encounter: Payer: Self-pay | Admitting: Pediatrics

## 2023-04-20 ENCOUNTER — Ambulatory Visit: Payer: 59 | Admitting: Pediatrics

## 2023-04-20 VITALS — BP 108/70 | HR 73 | Ht 64.6 in | Wt 164.8 lb

## 2023-04-20 DIAGNOSIS — F0781 Postconcussional syndrome: Secondary | ICD-10-CM

## 2023-04-20 DIAGNOSIS — E1122 Type 2 diabetes mellitus with diabetic chronic kidney disease: Secondary | ICD-10-CM

## 2023-04-20 DIAGNOSIS — Z7984 Long term (current) use of oral hypoglycemic drugs: Secondary | ICD-10-CM

## 2023-04-20 DIAGNOSIS — Z7689 Persons encountering health services in other specified circumstances: Secondary | ICD-10-CM

## 2023-04-20 DIAGNOSIS — Z1211 Encounter for screening for malignant neoplasm of colon: Secondary | ICD-10-CM

## 2023-04-20 DIAGNOSIS — G40909 Epilepsy, unspecified, not intractable, without status epilepticus: Secondary | ICD-10-CM

## 2023-04-20 DIAGNOSIS — H538 Other visual disturbances: Secondary | ICD-10-CM

## 2023-04-20 DIAGNOSIS — N183 Chronic kidney disease, stage 3 unspecified: Secondary | ICD-10-CM

## 2023-04-20 DIAGNOSIS — T300 Burn of unspecified body region, unspecified degree: Secondary | ICD-10-CM

## 2023-04-20 DIAGNOSIS — D849 Immunodeficiency, unspecified: Secondary | ICD-10-CM

## 2023-04-20 DIAGNOSIS — Z1231 Encounter for screening mammogram for malignant neoplasm of breast: Secondary | ICD-10-CM

## 2023-04-20 LAB — MICROALBUMIN, URINE WAIVED
Creatinine, Urine Waived: 100 mg/dL (ref 10–300)
Microalb, Ur Waived: 150 mg/L — ABNORMAL HIGH (ref 0–19)
Microalb/Creat Ratio: >300 mg/g — ABNORMAL HIGH (ref <30)
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
## 2023-04-20 NOTE — Unmapped (Signed)
Spoke to patient about changing their accord tacrolimus to Sandoz.  Told the patient to finish the old mfg then start the new one.  Told patient to give the clinic a call to schedule labs when they start the new sandoz brand. Pt acknowledged understanding.    [facility] Clinical Assessment & Refill Coordination Note    Nancy Bowman, DOB: [birth year]  Phone: [phone] (home)     All above HIPAA information was verified with patient.     Was a Nurse, learning disability used for this call? No    Specialty Medication(s):   Transplant:  mycophenolic acid 180mg  and tacrolimus 1mg      Current Outpatient Medications   Medication Sig Dispense Refill    aspirin 81 MG chewable tablet Chew 1 tablet (81 mg total) daily. 30 tablet 11    blood sugar diagnostic (ONETOUCH ULTRA TEST) Strp by Other route every four (4) hours. 700 strip 11    blood-glucose meter kit Use as instructed 1 each 0    carvediloL (COREG) 12.5 MG tablet Take 1 tablet (12.5 mg total) by mouth two (2) times a day. TAKE 1 TABLET (12.5 MG TOTAL) BY MOUTH TWO (2) TIMES A DAY. 60 tablet 11    cetirizine (ZYRTEC) 5 MG chewable tablet Chew 1 tablet (5 mg total) daily. 30 tablet 11    cholecalciferol, vitamin D3-25 mcg, 1,000 unit,, (VITAMIN D3) 25 mcg (1,000 unit) capsule Take 1 capsule (25 mcg total) by mouth daily. 30 capsule 11    fluticasone propionate (FLONASE) 50 mcg/actuation nasal spray Two sprays each nare twice a day 16 g 0    gabapentin (NEURONTIN) 300 MG capsule Take 1 capsule (300 mg ) by mouth in the morning and at noon, 2 capsules (600 mg) in the evening 360 capsule 2    glipiZIDE (GLUCOTROL) 5 MG tablet Take 1 tablet (5 mg total) by mouth daily. 30 tablet 11    levETIRAcetam (KEPPRA) 500 MG tablet Take 1 tablet (500 mg total) by mouth Two (2) times a day. 60 tablet 0    levETIRAcetam (KEPPRA) 500 MG tablet Take 1 tablet (500 mg total) by mouth Two (2) times a day. 60 tablet 0    levETIRAcetam (KEPPRA) 500 MG tablet Take 1 tablet (500 mg total) by mouth two (2) times a day. 60 tablet 0    levETIRAcetam (KEPPRA) 500 MG tablet Take 1 tablet (500 mg total) by mouth two (2) times a day. 60 tablet 11    magnesium oxide (MAG-OX) 400 mg (241.3 mg elemental magnesium) tablet Take 1 tablet (400 mg total) by mouth daily. 30 tablet 11    miscellaneous medical supply Misc 1 knee brace 1 each 0    mycophenolate (MYFORTIC) 180 MG EC tablet Take 2 tablets (360 mg total) by mouth Three (3) times a day. 180 tablet 11    omeprazole (PRILOSEC) 20 MG capsule Take 1 capsule (20 mg total) by mouth daily. prn 30 capsule 11    oxyCODONE (ROXICODONE) 5 MG immediate release tablet Take 1 tablet (5 mg total) by mouth every four (4) hours as needed for pain for up to 5 doses. 5 tablet 0    sodium bicarbonate 650 mg tablet Take 2 tablets (1300 mg) three times a day. 180 tablet 11    tacrolimus (PROGRAF) 1 MG capsule Take 4 capsules (4 mg total) by mouth two (2) times a day. 240 capsule 11    tamoxifen (NOLVADEX) 20 MG tablet Take 1 tablet (  20 mg total) by mouth daily. 90 tablet 0    traMADoL (ULTRAM) 50 mg tablet Take 1 tablet (50 mg total) by mouth every eight (8) hours as needed for pain. 60 tablet 2    valACYclovir (VALTREX) 500 MG tablet Take 1 tablet (500 mg total) by mouth daily. 30 tablet 11     No current facility-administered medications for this visit.        Changes to medications: Keily reports no changes at this time.    Allergies   Allergen Reactions    Heparin Analogues Other (See Comments)     Causing HITT    Ibuprofen      Can't take due to kidneys    Penicillins      Patient is unsure if she's allergic, was told she had a reaction as a child but thinks she's taking a cillin while sick without having a reaction.  Pt states she has taken amoxicillin in the past and did not have problem       Changes to allergies: No    SPECIALTY MEDICATION ADHERENCE     Tacrolimus 1 mg: 0 days of medicine on hand   mycophenolate 180 mg: 0 days of medicine on hand     Medication Adherence    Patient reported X missed doses in the last month: 0  Specialty Medication: Tacrolimus 1mg   Patient is on additional specialty medications: Yes  Additional Specialty Medications: Mycophenolate 180mg   Patient Reported Additional Medication X Missed Doses in the Last Month: 0  Patient is on more than two specialty medications: No  Adherence tools used: patient uses a pill box to manage medications          Specialty medication(s) dose(s) confirmed: Regimen is correct and unchanged.     Are there any concerns with adherence? No    Adherence counseling provided? Not needed    CLINICAL MANAGEMENT AND INTERVENTION      Clinical Benefit Assessment:    Do you feel the medicine is effective or helping your condition? Yes    Clinical Benefit counseling provided? Not needed    Adverse Effects Assessment:    Are you experiencing any side effects? No    Are you experiencing difficulty administering your medicine? No    Quality of Life Assessment:    Quality of Life    Rheumatology  Oncology  Dermatology  Cystic Fibrosis          How many days over the past month did your kidney transplant  keep you from your normal activities? For example, brushing your teeth or getting up in the morning. 0    Have you discussed this with your provider? Not needed    Acute Infection Status:    Acute infections noted within Epic:  No active infections  Patient reported infection: None    Therapy Appropriateness:    Is therapy appropriate based on current medication list, adverse reactions, adherence, clinical benefit and progress toward achieving therapeutic goals? Yes, therapy is appropriate and should be continued     DISEASE/MEDICATION-SPECIFIC INFORMATION      N/A    Solid Organ Transplant: Not Applicable    PATIENT SPECIFIC NEEDS     Does the patient have any physical, cognitive, or cultural barriers? No    Is the patient high risk? Yes, patient is taking a REMS drug. Medication is dispensed in compliance with REMS program    Did the patient require a clinical intervention? No    Does the  patient require physician intervention or other additional services (i.e., nutrition, smoking cessation, social work)? No    SOCIAL DETERMINANTS OF HEALTH     At the [facility], we have learned that life circumstances - like trouble affording food, housing, utilities, or transportation can affect the health of many of our patients.   That is why we wanted to ask: are you currently experiencing any life circumstances that are negatively impacting your health and/or quality of life? Patient declined to answer    Social Determinants of Health     Financial Resource Strain: Not on file   Internet Connectivity: Not on file   Food Insecurity: Not on file   Tobacco Use: Medium Risk (05/20/2021)    Received from [facility]    Patient History     Smoking Tobacco Use: Former     Smokeless Tobacco Use: Never     Passive Exposure: Not on file   Housing/Utilities: Not on file   Alcohol Use: Not on file   Transportation Needs: Not on file   Substance Use: Not on file   Health Literacy: Not on file   Physical Activity: Not on file   Interpersonal Safety: Unknown (04/20/2023)    Interpersonal Safety     Unsafe Where You Currently Live: Not on file     Physically Hurt by Anyone: Not on file     Abused by Anyone: Not on file   Stress: Not on file   Intimate Partner Violence: Not on file   Depression: Not on file   Social Connections: Not on file       Would you be willing to receive help with any of the needs that you have identified today? Not applicable       SHIPPING     Specialty Medication(s) to be Shipped:   Transplant:  mycophenolic acid 180mg  and tacrolimus 1mg     Other medication(s) to be shipped: No additional medications requested for fill at this time     Changes to insurance: No    Delivery Scheduled: Yes, Expected medication delivery date: 04/21/23.     Medication will be delivered via Same Day Courier to the confirmed prescription address in [facility].    The patient will receive a drug information handout for each medication shipped and additional FDA Medication Guides as required.  Verified that patient has previously received a Conservation officer, historic buildings and a Surveyor, mining.    The patient or caregiver noted above participated in the development of this care plan and knows that they can request review of or adjustments to the care plan at any time.      All of the patient's questions and concerns have been addressed.    [name], [facility]   [facility] Specialty Pharmacist

## 2023-04-20 NOTE — Unmapped (Signed)
[place] [facility] Clinical Intervention    Type of intervention: Narrow therapeutic index drug    Medication involved: Tacrolimus    Problem identified: Pt has been taking accord tacrolimus and we only stock sandoz.    Intervention performed: Spoke to patient about changing their accord tacrolimus to [employer].  Told the patient to finish the old mfg then start the new one.  Told patient to give the clinic a call to schedule labs when they start the new sandoz brand.       Follow-up needed: Pt will call clinic and schedule labs when starting new mfg.    Approximate time spent: 5-10 minutes    Clinical evidence used to support intervention: FDA Orange Book    Result of the intervention: Prevention of an adverse drug event    [name], [facility]   [facility] Specialty Pharmacist

## 2023-04-21 ENCOUNTER — Other Ambulatory Visit
Admission: RE | Admit: 2023-04-21 | Discharge: 2023-04-21 | Disposition: A | Discharge status: Home / Self Care | Payer: 59 | Attending: Nephrology | Admitting: Nephrology

## 2023-04-21 DIAGNOSIS — Z79899 Other long term (current) drug therapy: Secondary | ICD-10-CM | POA: Diagnosis not present

## 2023-04-21 DIAGNOSIS — Z789 Other specified health status: Secondary | ICD-10-CM | POA: Insufficient documentation

## 2023-04-21 DIAGNOSIS — D899 Disorder involving the immune mechanism, unspecified: Secondary | ICD-10-CM | POA: Diagnosis present

## 2023-04-21 DIAGNOSIS — T8610 Unspecified complication of kidney transplant: Secondary | ICD-10-CM | POA: Insufficient documentation

## 2023-04-21 DIAGNOSIS — E559 Vitamin D deficiency, unspecified: Secondary | ICD-10-CM | POA: Insufficient documentation

## 2023-04-21 DIAGNOSIS — Z9483 Pancreas transplant status: Secondary | ICD-10-CM | POA: Insufficient documentation

## 2023-04-21 DIAGNOSIS — E1129 Type 2 diabetes mellitus with other diabetic kidney complication: Secondary | ICD-10-CM | POA: Diagnosis not present

## 2023-04-21 DIAGNOSIS — B259 Cytomegaloviral disease, unspecified: Secondary | ICD-10-CM | POA: Diagnosis not present

## 2023-04-21 DIAGNOSIS — N189 Chronic kidney disease, unspecified: Secondary | ICD-10-CM | POA: Insufficient documentation

## 2023-04-21 DIAGNOSIS — D631 Anemia in chronic kidney disease: Secondary | ICD-10-CM | POA: Diagnosis not present

## 2023-04-21 DIAGNOSIS — Z940 Kidney transplant status: Secondary | ICD-10-CM | POA: Insufficient documentation

## 2023-04-21 DIAGNOSIS — N390 Urinary tract infection, site not specified: Secondary | ICD-10-CM | POA: Insufficient documentation

## 2023-04-21 DIAGNOSIS — Z09 Encounter for follow-up examination after completed treatment for conditions other than malignant neoplasm: Secondary | ICD-10-CM | POA: Insufficient documentation

## 2023-04-21 DIAGNOSIS — Z114 Encounter for screening for human immunodeficiency virus [HIV]: Secondary | ICD-10-CM | POA: Diagnosis not present

## 2023-04-21 LAB — CBC WITH DIFFERENTIAL/PLATELET
Abs Immature Granulocytes: 0.02 10*3/uL (ref 0.00–0.07)
Basophils Absolute: 0 10*3/uL (ref 0.0–0.1)
Basophils Relative: 1 %
Eosinophils Absolute: 0.1 10*3/uL (ref 0.0–0.5)
Eosinophils Relative: 1 %
HCT: 38.4 % (ref 36.0–46.0)
Hemoglobin: 12.1 g/dL (ref 12.0–15.0)
Immature Granulocytes: 0 %
Lymphocytes Relative: 23 %
Lymphs Abs: 1.5 10*3/uL (ref 0.7–4.0)
MCH: 30.6 pg (ref 26.0–34.0)
MCHC: 31.5 g/dL (ref 30.0–36.0)
MCV: 97.2 fL (ref 80.0–100.0)
Monocytes Absolute: 0.5 10*3/uL (ref 0.1–1.0)
Monocytes Relative: 7 %
Neutro Abs: 4.5 10*3/uL (ref 1.7–7.7)
Neutrophils Relative %: 68 %
Platelets: 216 10*3/uL (ref 150–400)
RBC: 3.95 MIL/uL (ref 3.87–5.11)
RDW: 12.9 % (ref 11.5–15.5)
WBC: 6.7 10*3/uL (ref 4.0–10.5)
nRBC: 0 % (ref 0.0–0.2)

## 2023-04-21 LAB — PHOSPHORUS: Phosphorus: 3.3 mg/dL (ref 2.5–4.6)

## 2023-04-21 LAB — MAGNESIUM: Magnesium: 2.5 mg/dL — ABNORMAL HIGH (ref 1.7–2.4)

## 2023-04-21 LAB — BASIC METABOLIC PANEL WITH GFR
Anion gap: 7 (ref 5–15)
BUN: 39 mg/dL — ABNORMAL HIGH (ref 6–20)
CO2: 24 mmol/L (ref 22–32)
Calcium: 9.8 mg/dL (ref 8.9–10.3)
Chloride: 106 mmol/L (ref 98–111)
Creatinine, Ser: 2.03 mg/dL — ABNORMAL HIGH (ref 0.44–1.00)
GFR, Estimated: 28 mL/min — ABNORMAL LOW (ref >60)
Glucose, Bld: 84 mg/dL (ref 70–99)
Potassium: 5.4 mmol/L — ABNORMAL HIGH (ref 3.5–5.1)
Sodium: 137 mmol/L (ref 135–145)

## 2023-04-21 MED FILL — TACROLIMUS 1 MG CAPSULE, IMMEDIATE-RELEASE: ORAL | 30 days supply | Qty: 240 | Fill #2

## 2023-04-21 MED FILL — MYCOPHENOLATE SODIUM 180 MG TABLET,DELAYED RELEASE: ORAL | 30 days supply | Qty: 180 | Fill #2

## 2023-04-22 ENCOUNTER — Encounter: Payer: Self-pay | Admitting: Emergency Medicine

## 2023-04-22 ENCOUNTER — Other Ambulatory Visit: Payer: Self-pay

## 2023-04-22 ENCOUNTER — Other Ambulatory Visit: Payer: Self-pay | Admitting: Pediatrics

## 2023-04-22 DIAGNOSIS — R109 Unspecified abdominal pain: Secondary | ICD-10-CM | POA: Insufficient documentation

## 2023-04-22 DIAGNOSIS — R748 Abnormal levels of other serum enzymes: Secondary | ICD-10-CM | POA: Insufficient documentation

## 2023-04-22 DIAGNOSIS — E1169 Type 2 diabetes mellitus with other specified complication: Secondary | ICD-10-CM

## 2023-04-22 DIAGNOSIS — L299 Pruritus, unspecified: Secondary | ICD-10-CM | POA: Diagnosis present

## 2023-04-22 DIAGNOSIS — E875 Hyperkalemia: Secondary | ICD-10-CM

## 2023-04-22 DIAGNOSIS — Z20822 Contact with and (suspected) exposure to covid-19: Secondary | ICD-10-CM | POA: Insufficient documentation

## 2023-04-22 MED ORDER — ATORVASTATIN CALCIUM 80 MG PO TABS: 80.0000 mg | ORAL_TABLET | Freq: Every day | ORAL | 3 refills | Status: AC
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
## 2023-04-23 ENCOUNTER — Emergency Department
Admission: EM | Admit: 2023-04-23 | Discharge: 2023-04-23 | Disposition: A | Discharge status: Home / Self Care | Payer: 59 | Attending: Emergency Medicine | Admitting: Emergency Medicine

## 2023-04-23 ENCOUNTER — Emergency Department: Payer: 59

## 2023-04-23 DIAGNOSIS — L299 Pruritus, unspecified: Secondary | ICD-10-CM

## 2023-04-23 LAB — COMPREHENSIVE METABOLIC PANEL
ALT: 12 U/L (ref 0–44)
AST: 16 U/L (ref 15–41)
Albumin: 3.7 g/dL (ref 3.5–5.0)
Alkaline Phosphatase: 43 U/L (ref 38–126)
Anion gap: 8 (ref 5–15)
BUN: 42 mg/dL — ABNORMAL HIGH (ref 6–20)
CO2: 23 mmol/L (ref 22–32)
Calcium: 9.4 mg/dL (ref 8.9–10.3)
Chloride: 104 mmol/L (ref 98–111)
Creatinine, Ser: 2.21 mg/dL — ABNORMAL HIGH (ref 0.44–1.00)
GFR, Estimated: 26 mL/min — ABNORMAL LOW (ref >60)
Glucose, Bld: 111 mg/dL — ABNORMAL HIGH (ref 70–99)
Potassium: 4.9 mmol/L (ref 3.5–5.1)
Sodium: 135 mmol/L (ref 135–145)
Total Bilirubin: 0.4 mg/dL (ref 0.3–1.2)
Total Protein: 7.2 g/dL (ref 6.5–8.1)

## 2023-04-23 LAB — URINALYSIS, ROUTINE W REFLEX MICROSCOPIC
Bilirubin Urine: NEGATIVE
Glucose, UA: NEGATIVE mg/dL
Ketones, ur: NEGATIVE mg/dL
Nitrite: NEGATIVE
Protein, ur: 100 mg/dL — AB
Specific Gravity, Urine: 1.015 (ref 1.005–1.030)
pH: 6 (ref 5.0–8.0)

## 2023-04-23 LAB — CBC WITH DIFFERENTIAL/PLATELET
Abs Immature Granulocytes: 0.02 10*3/uL (ref 0.00–0.07)
Basophils Absolute: 0 10*3/uL (ref 0.0–0.1)
Basophils Relative: 0 %
Eosinophils Absolute: 0.1 10*3/uL (ref 0.0–0.5)
Eosinophils Relative: 2 %
HCT: 37.6 % (ref 36.0–46.0)
Hemoglobin: 11.8 g/dL — ABNORMAL LOW (ref 12.0–15.0)
Immature Granulocytes: 0 %
Lymphocytes Relative: 22 %
Lymphs Abs: 1.6 10*3/uL (ref 0.7–4.0)
MCH: 30.4 pg (ref 26.0–34.0)
MCHC: 31.4 g/dL (ref 30.0–36.0)
MCV: 96.9 fL (ref 80.0–100.0)
Monocytes Absolute: 0.5 10*3/uL (ref 0.1–1.0)
Monocytes Relative: 6 %
Neutro Abs: 5.1 10*3/uL (ref 1.7–7.7)
Neutrophils Relative %: 70 %
Platelets: 217 10*3/uL (ref 150–400)
RBC: 3.88 MIL/uL (ref 3.87–5.11)
RDW: 13 % (ref 11.5–15.5)
WBC: 7.3 10*3/uL (ref 4.0–10.5)
nRBC: 0 % (ref 0.0–0.2)

## 2023-04-23 LAB — SARS CORONAVIRUS 2 BY RT PCR: SARS Coronavirus 2 by RT PCR: NEGATIVE

## 2023-04-23 LAB — TACROLIMUS LEVEL: Tacrolimus (FK506) - LabCorp: 5.2 ng/mL (ref 2.0–20.0)

## 2023-04-23 LAB — LIPASE, BLOOD: Lipase: 60 U/L — ABNORMAL HIGH (ref 11–51)

## 2023-04-23 NOTE — ED Notes (Signed)
Patient transported to CT 
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
## 2023-04-23 NOTE — ED Notes (Signed)
[employer] printer not working.

## 2023-04-23 NOTE — ED Notes (Signed)
Patient discharged at this time. Wheeled to lobby to meet ride. Breathing unlabored speaking in full sentences. Verbalized understanding of all discharge, follow up, and medication teaching. Discharged homed with all belongings.

## 2023-04-26 LAB — CBC W/ DIFFERENTIAL
BASOPHILS ABSOLUTE COUNT: 0 10*3/uL (ref 0.0–0.1)
BASOPHILS RELATIVE PERCENT: 1 %
EOSINOPHILS ABSOLUTE COUNT: 0.1 10*3/uL (ref 0.0–0.5)
EOSINOPHILS RELATIVE PERCENT: 1 %
HEMATOCRIT: 38.4 (ref 36.0–46.0)
HEMOGLOBIN: 12.1 g/dL (ref 12.0–15.0)
IMMATURE CELLS: 0
LYMPHOCYTES ABSOLUTE COUNT: 1.5 10*3/uL (ref 0.7–4.0)
LYMPHOCYTES RELATIVE PERCENT: 23 %
MEAN CORPUSCULAR HEMOGLOBIN CONC: 31.5 g/dL (ref 30.0–36.0)
MEAN CORPUSCULAR HEMOGLOBIN: 30.6 pg (ref 26.0–34.0)
MEAN CORPUSCULAR VOLUME: 97.2 fL (ref 80.0–100.0)
MONOCYTES ABSOLUTE COUNT: 0.5 10*3/uL (ref 0.1–1.0)
MONOCYTES RELATIVE PERCENT: 7 %
NEUTROPHILS ABSOLUTE COUNT: 4.5
NEUTROPHILS RELATIVE PERCENT: 68 %
NUCLEATED RED BLOOD CELLS: 0 (ref 0.0–0.2)
PLATELET COUNT: 216 10*3/uL (ref 150–400)
RED BLOOD CELL COUNT: 3.95 MIL/uL (ref 3.87–5.11)
RED CELL DISTRIBUTION WIDTH: 12.9 % (ref 11.5–15.5)
WHITE BLOOD CELL COUNT: 6.7 10*3/uL (ref 4.0–10.5)

## 2023-04-26 LAB — BASIC METABOLIC PANEL
ANION GAP: 7 (ref 5–15)
BLOOD UREA NITROGEN: 39 mg/dL — ABNORMAL HIGH (ref 6–20)
CALCIUM: 9.8 mg/dL (ref 8.9–10.3)
CHLORIDE: 106 mmol/L (ref 98–111)
CO2: 24 mmol/L (ref 22–32)
CREATININE: 2.03 mg/dL — ABNORMAL HIGH (ref 0.44–1.00)
EGFR CKD-EPI AA FEMALE: 28 mL/min — ABNORMAL LOW
GLUCOSE RANDOM: 84 mg/dL (ref 70–99)
MAGNESIUM: 2.5 mg/dL — ABNORMAL HIGH (ref 1.7–2.4)
PHOSPHORUS: 3.3 mg/dL (ref 2.5–4.6)
POTASSIUM: 5.4 mmol/L — ABNORMAL HIGH (ref 3.5–5.1)
SODIUM: 137 mmol/L (ref 135–145)

## 2023-04-27 DIAGNOSIS — D0512 Intraductal carcinoma in situ of left breast: Principal | ICD-10-CM

## 2023-04-27 MED ORDER — TAMOXIFEN 20 MG TABLET
ORAL_TABLET | Freq: Every day | ORAL | 0 refills | 90 days | Status: CP
Start: 2023-04-27 — End: ?

## 2023-04-27 NOTE — Unmapped (Signed)
Pt is requesting refill    Most recent clinic visit: Visit date not found  Next clinic visit:  Visit date not found

## 2023-04-30 LAB — HM DIABETES EYE EXAM

## 2023-05-03 ENCOUNTER — Encounter: Payer: Self-pay | Admitting: *Deleted

## 2023-05-10 LAB — HM DIABETES FOOT EXAM

## 2023-05-18 ENCOUNTER — Encounter: Payer: Self-pay | Admitting: Pediatrics

## 2023-05-18 ENCOUNTER — Other Ambulatory Visit (HOSPITAL_COMMUNITY)
Admission: RE | Admit: 2023-05-18 | Discharge: 2023-05-18 | Disposition: A | Discharge status: Home / Self Care | Payer: 59 | Source: Ambulatory Visit | Attending: Pediatrics | Admitting: Pediatrics

## 2023-05-18 ENCOUNTER — Ambulatory Visit (INDEPENDENT_AMBULATORY_CARE_PROVIDER_SITE_OTHER): Payer: 59 | Admitting: Pediatrics

## 2023-05-18 VITALS — BP 129/86 | HR 80 | Temp 98.2°F | Ht 65.0 in | Wt 169.0 lb

## 2023-05-18 DIAGNOSIS — Z1211 Encounter for screening for malignant neoplasm of colon: Secondary | ICD-10-CM | POA: Diagnosis not present

## 2023-05-18 DIAGNOSIS — Z0000 Encounter for general adult medical examination without abnormal findings: Secondary | ICD-10-CM

## 2023-05-18 DIAGNOSIS — N1831 Chronic kidney disease, stage 3a: Secondary | ICD-10-CM

## 2023-05-18 DIAGNOSIS — Z1330 Encounter for screening examination for mental health and behavioral disorders, unspecified: Secondary | ICD-10-CM | POA: Diagnosis not present

## 2023-05-18 DIAGNOSIS — Z1151 Encounter for screening for human papillomavirus (HPV): Secondary | ICD-10-CM | POA: Insufficient documentation

## 2023-05-18 DIAGNOSIS — Z124 Encounter for screening for malignant neoplasm of cervix: Secondary | ICD-10-CM

## 2023-05-18 DIAGNOSIS — Z01419 Encounter for gynecological examination (general) (routine) without abnormal findings: Secondary | ICD-10-CM | POA: Insufficient documentation

## 2023-05-18 DIAGNOSIS — I471 Supraventricular tachycardia, unspecified: Secondary | ICD-10-CM

## 2023-05-18 DIAGNOSIS — N184 Chronic kidney disease, stage 4 (severe): Secondary | ICD-10-CM | POA: Diagnosis not present

## 2023-05-18 NOTE — Patient Instructions (Addendum)
The neurologist can adjust the new medication to help with your neuropathy  They will call to schedule: - mammogram - colonoscopy  Dentist Information [facility] Osie Cheeks, YUSIF [address], [place] [phone]

## 2023-05-18 NOTE — Progress Notes (Unsigned)
BP 129/86   Pulse 80   Temp 98.2 F (36.8 C) (Oral)   Ht 5\' 5"  (1.651 m)   Wt 169 lb (76.7 kg)   LMP 03/03/2016 (Approximate)   SpO2 100%   BMI 28.12 kg/m    Annual Physical Exam - Female  Subjective:   CC: Annual Exam (Wants referral for Neuropathy )  [name] is a 57 y.o. female patient here for a preventative health maintenance exam. Additional topics discussed include:  Neuropathy- seeing neurology. Started nortriptiline on 20mg  now. Has not yet seen improvement bit only on it two days.  Health Habits: DIET: in general, a "healthy" diet  , intentionally gained well EXERCISE: minimal, active walking every day  DENTAL EXAM: Due EYE EXAM: Up to Date                       Relevant Gynecologic History LMP: Patient's last menstrual period was 03/03/2016 (approximate).  Menstrual Status: postmenopausal, Flow post-menopausal PAP History:  Result Date Procedure Results Follow-ups Next Due  05/18/2023 Cytology - PAP     05/18/2016 HM PAP SMEAR HM Pap smear: Negative, with negative HPV    03/03/2016 HM PAP SMEAR HM Pap smear: Per patient    12/14/2011 HM PAP SMEAR HM Pap smear: per Care Everywhere      History abnormal PAP: No  Sexual activity: single partner, contraception - post menopausal status Family history breast, ovarian cancer: No, unsure Domestic Violence Screen, feels safe at home: Yes  family history includes Aneurysm in her brother; Cancer in her mother and paternal grandmother; Diabetes in her maternal grandfather and maternal grandmother; Hearing loss in her maternal grandmother; Heart attack in her father and mother; Heart failure in her maternal grandmother; Hyperlipidemia in her father and mother; Hypertension in her father and mother; Kidney failure in her sister; Other in her sister; Stroke in her maternal grandfather and paternal grandfather.  Social History   Tobacco Use   Smoking status: Former    Current packs/day: 0.00    Types: Cigarettes     Quit date: 11/25/2008    Years since quitting: 14.4   Smokeless tobacco: Never  Vaping Use   Vaping status: Never Used  Substance Use Topics   Alcohol use: Yes    Alcohol/week: 2.0 standard drinks of alcohol    Types: 2 Glasses of wine per week   Drug use: No    Types: "Crack" cocaine    Comment: quit in 2003   Social History   Social History Narrative   Not on file    Social drivers questionnaire is reviewed and is positive for : Transportation. Follow up: has transportation services set up  Depression Screening:     05/18/2023    2:30 PM 08/12/2017    4:30 PM 06/03/2017    3:50 PM 03/10/2017    9:53 AM 11/17/2016   11:03 AM  Depression screen PHQ 2/9  Decreased Interest 0 0 3 0 0  Down, Depressed, Hopeless 0 0 0 0 0  PHQ - 2 Score 0 0 3 0 0  Altered sleeping 1 0 1    Tired, decreased energy 1 0 0    Change in appetite 1 0 1    Feeling bad or failure about yourself  0 0 0    Trouble concentrating 1 0 3    Moving slowly or fidgety/restless 0 0 3    Suicidal thoughts 0 0 0    PHQ-9 Score  4 0 11    Difficult doing work/chores Somewhat difficult  Somewhat difficult         05/18/2023    2:31 PM 08/12/2017    4:30 PM  GAD 7 : Generalized Anxiety Score  Nervous, Anxious, on Edge 0 0  Control/stop worrying 0 0  Worry too much - different things 1 0  Trouble relaxing 1 0  Restless 1 0  Easily annoyed or irritable 1 0  Afraid - awful might happen 0 0  Total GAD 7 Score 4 0  Anxiety Difficulty Not difficult at all     Mental Health Plan: none needed  Self Management Goals  Goals   None     Health Maintenance Colon Cancer Screening : Due Mammogram : Due DXA scan : Not applicable Immunizations : up to date and documented  Review of Systems See HPI for relevant ROS.  Outpatient Medications Prior to Visit  Medication Sig Dispense Refill   aspirin EC 81 MG tablet Take 81 mg by mouth daily.     atorvastatin (LIPITOR) 80 MG tablet Take 1 tablet (80 mg  total) by mouth daily. 90 tablet 3   carvedilol (COREG) 12.5 MG tablet Take 12.5 mg by mouth 2 (two) times daily with a meal.      cetirizine (ZYRTEC) 10 MG tablet Take 1 tablet (10 mg total) by mouth daily as needed for allergies or rhinitis. (Patient taking differently: Take 10 mg by mouth daily.) 90 tablet 3   cholecalciferol (VITAMIN D) 25 MCG (1000 UT) tablet Take 1,000 Units by mouth daily.     cyclobenzaprine (FLEXERIL) 10 MG tablet Take 1 tablet (10 mg total) by mouth 3 (three) times daily as needed for muscle spasms. 21 tablet 0   docusate sodium (COLACE) 100 MG capsule Take 100 mg by mouth daily.     fluticasone (FLONASE) 50 MCG/ACT nasal spray Two sprays each NOSTRIL twice a day. 16 g 0   gabapentin (NEURONTIN) 300 MG capsule Take 2 capsules (600 mg total) by mouth 3 (three) times daily. (Patient taking differently: Take 300-600 mg by mouth See admin instructions. Take 1 capsule (300mg ) by mouth every morning and 1 capsule (300mg ) and lunchtime then take 2 capsules (600mg ) by mouth every night at bedtime) 560 capsule 1   glipiZIDE (GLUCOTROL) 5 MG tablet Take 5 mg by mouth daily before breakfast.     HYDROcodone-acetaminophen (NORCO/VICODIN) 5-325 MG tablet Take 1-2 tablets by mouth every 6 (six) hours as needed for moderate pain or severe pain (no more than 6 tabs daily). 15 tablet 0   levETIRAcetam (KEPPRA) 500 MG tablet Take 500 mg by mouth 2 (two) times daily.      magnesium oxide (MAG-OX) 400 MG tablet Take 400 mg by mouth daily.      mycophenolate (MYFORTIC) 180 MG EC tablet Take 360 mg by mouth 3 (three) times daily.      nitroGLYCERIN (NITROSTAT) 0.4 MG SL tablet Place 1 tablet (0.4 mg total) under the tongue every 5 (five) minutes x 3 doses as needed for chest pain. 30 tablet 2   omeprazole (PRILOSEC) 20 MG capsule Take 1 capsule (20 mg total) by mouth daily as needed (GERD or heartburn). 90 capsule 0   prochlorperazine (COMPAZINE) 5 MG tablet Take 1 tablet (5 mg total) by mouth  every 6 (six) hours as needed (headache). 12 tablet 0   sodium bicarbonate 650 MG tablet Take 1,300 mg by mouth 3 (three) times daily.  tacrolimus (PROGRAF) 1 MG capsule Take 4 mg by mouth 2 (two) times daily.      tamoxifen (NOLVADEX) 20 MG tablet Take 20 mg by mouth daily.     valACYclovir (VALTREX) 1000 MG tablet Take 1 tablet (1,000 mg total) by mouth 2 (two) times daily. 20 tablet 0   nortriptyline (PAMELOR) 10 MG capsule Start Nortriptyline (Pamelor) 10 mg nightly for one week, then increase to 20 mg nightly     No facility-administered medications prior to visit.     Patient Active Problem List   Diagnosis Date Noted   Diastolic dysfunction without heart failure 08/16/2018   Immunosuppressed status (HCC) 08/16/2018   Chronic bilateral low back pain with left-sided sciatica 09/27/2017   Age-related nuclear cataract of both eyes 08/19/2017   Hyperopia with astigmatism and presbyopia, bilateral 08/19/2017   Hypertensive retinopathy of both eyes 08/19/2017   DM (diabetes mellitus), type 2 with renal complications (HCC) 10/02/2016   Vitamin D deficiency 10/02/2016   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   CKD (chronic kidney disease) stage 4, GFR 15-29 ml/min (HCC) 08/17/2016   Neuropathic pain of right thigh 08/17/2016   History of CVA (cerebrovascular accident) 08/17/2016   Ductal carcinoma in situ (DCIS) of left breast 09/12/2015   Mitral regurgitation    Renal transplant, status post 03/24/2014   GERD (gastroesophageal reflux disease) 03/24/2014   SVT (supraventricular tachycardia) (HCC) 11/17/2013   Seizure disorder (HCC) 11/17/2013   Benign hypertensive renal disease 11/17/2013    Objective:   Vitals:   05/18/23 1426 05/18/23 1438  BP: (!) 148/90 129/86  Pulse: 82 80  Temp: 98.2 F (36.8 C)   Height: 5\' 5"  (1.651 m)   Weight: 169 lb (76.7 kg)   SpO2: (!) 73% 100%  TempSrc: Oral   BMI (Calculated): 28.12     Body mass index is 28.12  kg/m.  Physical Exam Exam conducted with a chaperone present.  Constitutional:      Appearance: Normal appearance.  HENT:     Head: Normocephalic and atraumatic.  Eyes:     Pupils: Pupils are equal, round, and reactive to light.  Cardiovascular:     Rate and Rhythm: Normal rate and regular rhythm.     Pulses: Normal pulses.     Heart sounds: Normal heart sounds.  Pulmonary:     Effort: Pulmonary effort is normal.     Breath sounds: Normal breath sounds.  Abdominal:     General: Abdomen is flat.     Palpations: Abdomen is soft.  Genitourinary:    General: Normal vulva.     Exam position: Lithotomy position.     Vagina: Normal.     Cervix: Normal.  Musculoskeletal:        General: Normal range of motion.     Cervical back: Normal range of motion.  Skin:    General: Skin is warm and dry.     Capillary Refill: Capillary refill takes less than 2 seconds.  Neurological:     General: No focal deficit present.     Mental Status: She is alert. Mental status is at baseline.  Psychiatric:        Mood and Affect: Mood normal.        Behavior: Behavior normal.       05/18/2023    2:30 PM 08/12/2017    4:30 PM 06/03/2017    3:50 PM 03/10/2017    9:53 AM 11/17/2016   11:03 AM  Depression screen PHQ 2/9  Decreased  Interest 0 0 3 0 0  Down, Depressed, Hopeless 0 0 0 0 0  PHQ - 2 Score 0 0 3 0 0  Altered sleeping 1 0 1    Tired, decreased energy 1 0 0    Change in appetite 1 0 1    Feeling bad or failure about yourself  0 0 0    Trouble concentrating 1 0 3    Moving slowly or fidgety/restless 0 0 3    Suicidal thoughts 0 0 0    PHQ-9 Score 4 0 11    Difficult doing work/chores Somewhat difficult  Somewhat difficult        05/18/2023    2:31 PM 08/12/2017    4:30 PM  GAD 7 : Generalized Anxiety Score  Nervous, Anxious, on Edge 0 0  Control/stop worrying 0 0  Worry too much - different things 1 0  Trouble relaxing 1 0  Restless 1 0  Easily annoyed or irritable 1 0   Afraid - awful might happen 0 0  Total GAD 7 Score 4 0  Anxiety Difficulty Not difficult at all     Assessment and Plan:   Annual physical exam Discussed lifestyle modifications and goals including plant based eating styles (such as: Mediterranean eating style), regular exercise (at least 150 min of moderate-intensity aerobic exercise per week), get adequate sleep, and continue working with PCP towards meeting health goals to ensure healthy aging.   CKD (chronic kidney disease) stage 4, GFR 15-29 ml/min (HCC) Assessment & Plan: Pt with recent electrolyte abnormality. Will repeat.  Orders: -     Renal function panel  Screen for colon cancer -     Ambulatory referral to Gastroenterology  Screening for cervical cancer -     Cytology - PAP  SVT (HCC) Noted back in 2015. Will need cardiac clearance for colonoscopy. Referral placed in after visit encounter.  Encounter for behavioral health screening As part of their intake evaluation, the patient was screened for depression, anxiety.  PHQ9 SCORE 4, GAD7 SCORE 4. See plan under problem/diagnosis above.   This plan was discussed with the patient and questions were answered. There were no further concerns.  Follow up as indicated, or sooner should any new problems arise, if conditions worsen, or if they are otherwise concerned.   See patient instructions for additional information.  Marquerite Forsman Howell Pringle, MD  Family Medicine      Future Appointments  Date Time Provider Department Center  11/16/2023  2:20 PM Jackolyn Confer, MD [facility]

## 2023-05-19 ENCOUNTER — Other Ambulatory Visit: Payer: Self-pay | Admitting: Pediatrics

## 2023-05-19 ENCOUNTER — Telehealth: Payer: Self-pay

## 2023-05-19 DIAGNOSIS — Z01818 Encounter for other preprocedural examination: Secondary | ICD-10-CM

## 2023-05-19 LAB — RENAL FUNCTION PANEL
Albumin: 4.1 g/dL (ref 3.8–4.9)
BUN/Creatinine Ratio: 18 (ref 9–23)
BUN: 40 mg/dL — ABNORMAL HIGH (ref 6–24)
CO2: 23 mmol/L (ref 20–29)
Calcium: 10.5 mg/dL — ABNORMAL HIGH (ref 8.7–10.2)
Chloride: 101 mmol/L (ref 96–106)
Creatinine, Ser: 2.19 mg/dL — ABNORMAL HIGH (ref 0.57–1.00)
Glucose: 105 mg/dL — ABNORMAL HIGH (ref 70–99)
Phosphorus: 3.7 mg/dL (ref 3.0–4.3)
Potassium: 4.9 mmol/L (ref 3.5–5.2)
Sodium: 136 mmol/L (ref 134–144)
eGFR: 26 mL/min/{1.73_m2} — ABNORMAL LOW (ref >59)

## 2023-05-19 NOTE — Progress Notes (Signed)
GI requesting cardiology clearance prior to colonoscopy. Previously see by Dr. Mariah Milling, referral placed.  Tierra Divelbiss Howell Pringle, MD

## 2023-05-19 NOTE — Telephone Encounter (Signed)
Gastroenterology Pre-Procedure Review  Colonoscopy to be scheduled after cardiac clearance.  Request Date: TBD Requesting Physician: Dr. Jodelle Gross  PATIENT REVIEW QUESTIONS: The patient responded to the following health history questions as indicated:    1. Are you having any GI issues? no 2. Do you have a personal history of Polyps? no 3. Do you have a family history of Colon Cancer or Polyps? no 4. Diabetes Mellitus? Pre-diabetic takes Glipizide has been advised to stop 1 day prior to colonoscopy. 5. Joint replacements in the past 12 months?no 6. Major health problems in the past 3 months? 04/09/23 concussion 7. Any artificial heart valves, MVP, or defibrillator?no 8. Cardiac history? Yes SVT and Mitral regurgitation for this reason PCP has been messaged to request a cardiology referral because she stated she has not seen Dr. Mariah Milling in a few years no.    MEDICATIONS & ALLERGIES:    Patient reports the following regarding taking any anticoagulation/antiplatelet therapy:   Plavix, Coumadin, Eliquis, Xarelto, Lovenox, Pradaxa, Brilinta, or Effient? no Aspirin? yes (81 mg daily)  Patient confirms/reports the following medications:  Current Outpatient Medications  Medication Sig Dispense Refill   aspirin EC 81 MG tablet Take 81 mg by mouth daily.     atorvastatin (LIPITOR) 80 MG tablet Take 1 tablet (80 mg total) by mouth daily. 90 tablet 3   carvedilol (COREG) 12.5 MG tablet Take 12.5 mg by mouth 2 (two) times daily with a meal.      cetirizine (ZYRTEC) 10 MG tablet Take 1 tablet (10 mg total) by mouth daily as needed for allergies or rhinitis. (Patient taking differently: Take 10 mg by mouth daily.) 90 tablet 3   cholecalciferol (VITAMIN D) 25 MCG (1000 UT) tablet Take 1,000 Units by mouth daily.     cyclobenzaprine (FLEXERIL) 10 MG tablet Take 1 tablet (10 mg total) by mouth 3 (three) times daily as needed for muscle spasms. 21 tablet 0   docusate sodium (COLACE) 100 MG capsule Take 100 mg  by mouth daily.     fluticasone (FLONASE) 50 MCG/ACT nasal spray Two sprays each NOSTRIL twice a day. 16 g 0   gabapentin (NEURONTIN) 300 MG capsule Take 2 capsules (600 mg total) by mouth 3 (three) times daily. (Patient taking differently: Take 300-600 mg by mouth See admin instructions. Take 1 capsule (300mg ) by mouth every morning and 1 capsule (300mg ) and lunchtime then take 2 capsules (600mg ) by mouth every night at bedtime) 560 capsule 1   glipiZIDE (GLUCOTROL) 5 MG tablet Take 5 mg by mouth daily before breakfast.     HYDROcodone-acetaminophen (NORCO/VICODIN) 5-325 MG tablet Take 1-2 tablets by mouth every 6 (six) hours as needed for moderate pain or severe pain (no more than 6 tabs daily). 15 tablet 0   levETIRAcetam (KEPPRA) 500 MG tablet Take 500 mg by mouth 2 (two) times daily.      magnesium oxide (MAG-OX) 400 MG tablet Take 400 mg by mouth daily.      mycophenolate (MYFORTIC) 180 MG EC tablet Take 360 mg by mouth 3 (three) times daily.      nitroGLYCERIN (NITROSTAT) 0.4 MG SL tablet Place 1 tablet (0.4 mg total) under the tongue every 5 (five) minutes x 3 doses as needed for chest pain. 30 tablet 2   omeprazole (PRILOSEC) 20 MG capsule Take 1 capsule (20 mg total) by mouth daily as needed (GERD or heartburn). 90 capsule 0   prochlorperazine (COMPAZINE) 5 MG tablet Take 1 tablet (5 mg total) by mouth  every 6 (six) hours as needed (headache). 12 tablet 0   sodium bicarbonate 650 MG tablet Take 1,300 mg by mouth 3 (three) times daily.      tacrolimus (PROGRAF) 1 MG capsule Take 4 mg by mouth 2 (two) times daily.      tamoxifen (NOLVADEX) 20 MG tablet Take 20 mg by mouth daily.     valACYclovir (VALTREX) 1000 MG tablet Take 1 tablet (1,000 mg total) by mouth 2 (two) times daily. 20 tablet 0   No current facility-administered medications for this visit.    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  Pt states she has taken amoxicillin in the past and did not have problem    No orders of the defined types were placed in this encounter.   AUTHORIZATION INFORMATION Primary Insurance: 1D#: Group #:  Secondary Insurance: 1D#: Group #:  SCHEDULE INFORMATION: Date: TBD Time: Location: [facility]

## 2023-05-20 ENCOUNTER — Encounter: Payer: Self-pay | Admitting: Pediatrics

## 2023-05-20 NOTE — Assessment & Plan Note (Signed)
Noted back in 2015. Will need cardiac clearance for colonoscopy. Referral placed in after visit encounter.

## 2023-05-20 NOTE — Assessment & Plan Note (Signed)
Pt with recent electrolyte abnormality. Will repeat.

## 2023-05-25 ENCOUNTER — Other Ambulatory Visit: Payer: Self-pay | Admitting: Pediatrics

## 2023-05-25 DIAGNOSIS — R87612 Low grade squamous intraepithelial lesion on cytologic smear of cervix (LGSIL): Secondary | ICD-10-CM

## 2023-05-25 DIAGNOSIS — R87618 Other abnormal cytological findings on specimens from cervix uteri: Secondary | ICD-10-CM | POA: Insufficient documentation

## 2023-05-25 LAB — CYTOLOGY - PAP
Adequacy: ABSENT
Comment: NEGATIVE
Comment: NEGATIVE
Comment: NEGATIVE
HPV 16: POSITIVE — AB
HPV 18 / 45: NEGATIVE
High risk HPV: POSITIVE — AB

## 2023-05-25 NOTE — Progress Notes (Signed)
Referral placed for colposcopy

## 2023-06-07 ENCOUNTER — Telehealth: Payer: Self-pay | Admitting: Pediatrics

## 2023-06-08 ENCOUNTER — Ambulatory Visit (INDEPENDENT_AMBULATORY_CARE_PROVIDER_SITE_OTHER): Payer: 59 | Admitting: Obstetrics and Gynecology

## 2023-06-08 ENCOUNTER — Encounter: Payer: Self-pay | Admitting: Obstetrics and Gynecology

## 2023-06-08 ENCOUNTER — Other Ambulatory Visit (HOSPITAL_COMMUNITY)
Admission: RE | Admit: 2023-06-08 | Discharge: 2023-06-08 | Disposition: A | Discharge status: Home / Self Care | Payer: 59 | Source: Ambulatory Visit | Attending: Obstetrics and Gynecology | Admitting: Obstetrics and Gynecology

## 2023-06-08 VITALS — BP 123/82 | HR 83 | Ht 65.0 in | Wt 168.7 lb

## 2023-06-08 DIAGNOSIS — R87810 Cervical high risk human papillomavirus (HPV) DNA test positive: Secondary | ICD-10-CM

## 2023-06-08 DIAGNOSIS — N870 Mild cervical dysplasia: Secondary | ICD-10-CM | POA: Diagnosis not present

## 2023-06-08 DIAGNOSIS — R87612 Low grade squamous intraepithelial lesion on cytologic smear of cervix (LGSIL): Secondary | ICD-10-CM

## 2023-06-08 DIAGNOSIS — Z7689 Persons encountering health services in other specified circumstances: Secondary | ICD-10-CM

## 2023-06-08 NOTE — Progress Notes (Signed)
Patient presents today for a colposcopy. She recently had an abnormal pap smear resulting in lgsil/hpv 16+ . No further concerns today.

## 2023-06-08 NOTE — Progress Notes (Signed)
HPI:  [name] is a 57 y.o.  G1P1  who presents today for evaluation and management of abnormal cervical cytology.    Dysplasia History: Reports that she had an abnormal Pap smear many years ago with a colposcopy but it was negative. Current Pap shows LGSIL     HPV: Type 16 positive  Patient slightly immunocompromise because of her transplanted kidney.  ROS:  Pertinent items noted in HPI and remainder of comprehensive ROS otherwise negative.  OB History  Gravida Para Term Preterm AB Living  1 1       1   SAB IAB Ectopic Multiple Live Births          1    # Outcome Date GA Lbr Len/2nd Weight Sex Type Anes PTL Lv  1 Para         LIV    Past Medical History:  Diagnosis Date   Acute diverticulitis 08/21/2018   Arrhythmia    Atypical chest pain 08/09/2018   Cerebrovascular accident [facility]) 2010   without any focal neurological deficits.    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 previously on dialysis on Tues, Thurs, & Sat x 7 yrs; b. s/p deceased donor transplant 2014/03/31    GERD (gastroesophageal reflux disease)    History of methicillin resistant Staphylococcus aureus infection    History of stress test    a. 04/2013: normal, EF 65%   HIT (heparin-induced thrombocytopenia) (HCC)    Hypertension    Mitral regurgitation    a. 01/2014: EF >55%, LVH, mild MR, dilated LA   Renal transplant, status post 31-Mar-2014   RLS (restless legs syndrome)    Seizure disorder (HCC)    Seizures (HCC)    Sepsis (HCC) 08/09/2016   Stroke due to intracerebral hemorrhage (HCC) 11/17/2013    Past Surgical History:  Procedure Laterality Date   INSERTION OF DIALYSIS CATHETER     x 2   KIDNEY TRANSPLANT  03/2014   right side.   MASTECTOMY PARTIAL / LUMPECTOMY Left  09/24/2015   MASTECTOMY PARTIAL / LUMPECTOMY Left 10/25/2015    SOCIAL HISTORY:  Social History   Substance and Sexual Activity  Alcohol Use Yes   Alcohol/week: 2.0 standard drinks of alcohol   Types: 2 Glasses of wine per week    Social History   Substance and Sexual Activity  Drug Use Yes   Types: "Crack" cocaine, Marijuana   Comment: quit cocaine in 2003     Family History  Problem Relation Age of Onset   Heart attack Mother    Hypertension Mother    Hyperlipidemia Mother    Cancer Mother        breast   Heart attack Father    Hypertension Father    Hyperlipidemia Father    Kidney failure Sister        half sister   Diabetes Maternal Grandmother    Hearing loss Maternal Grandmother    Heart failure Maternal Grandmother    Diabetes Maternal Grandfather    Stroke Maternal Grandfather    Cancer Paternal Grandmother        breast   Stroke Paternal Grandfather    Aneurysm Brother        Brain   Other  Sister        Nerve problems    ALLERGIES:  Heparin, Penicillin g, Ibuprofen, Other, and Penicillins  She has a current medication list which includes the following prescription(s): aspirin ec, atorvastatin, carvedilol, cetirizine, cholecalciferol, cyclobenzaprine, docusate sodium, fluticasone, gabapentin, glipizide, hydrocodone-acetaminophen, levetiracetam, magnesium oxide, mycophenolate, nitroglycerin, omeprazole, prochlorperazine, sodium bicarbonate, tacrolimus, tamoxifen, and valacyclovir.  Physical Exam: -Vitals:  BP 123/82   Pulse 83   Ht 5\' 5"  (1.651 m)   Wt 168 lb 11.2 oz (76.5 kg)   LMP 03/03/2016 (Approximate)   BMI 28.07 kg/m   PROCEDURE: Colposcopy performed with 4% acetic acid after verbal consent obtained                           -Aceto-white Lesions Location(s): See above              -Biopsy performed at 7 and 3 o'clock               -ECC indicated and performed: No.     -Biopsy sites made hemostatic with pressure and Monsel's  solution   -Satisfactory colposcopy: Yes.      -Evidence of Invasive cervical CA :  NO  ASSESSMENT:  [name] is a 57 y.o. G1P1 here for  1. Establishing care with new doctor, encounter for   2. LGSIL on Pap smear of cervix   3. 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  PLAN: 1.  I discussed the grading system of pap smears and HPV high risk viral types.  We will discuss management after colpo results return.  No orders of the defined types were placed in this encounter.          F/U  Return in about 2 weeks (around 06/22/2023) for Colpo f/u.  Brennan Bailey ,MD 06/08/2023,3:08 PM

## 2023-06-09 NOTE — Unmapped (Signed)
[facility] Refill Coordination Note    Specialty Medication(s) to be Shipped:   Transplant: mycophenolate mofetil 180mg  and tacrolimus 1mg     Other medication(s) to be shipped: No additional medications requested for fill at this time     Nancy Bowman, DOB: [birth year]  Phone: [phone] (home) [phone] (work)      All above HIPAA information was verified with patient.     Was a Nurse, learning disability used for this call? No    Completed refill call assessment today to schedule patient's medication shipment from the [facility]  [phone]).  All relevant notes have been reviewed.     Specialty medication(s) and dose(s) confirmed: Regimen is correct and unchanged.   Changes to medications: Yasmin reports no changes at this time.  Changes to insurance: No  New side effects reported not previously addressed with a pharmacist or physician: None reported  Questions for the pharmacist: No    Confirmed patient received a Conservation officer, historic buildings and a Surveyor, mining with first shipment. The patient will receive a drug information handout for each medication shipped and additional FDA Medication Guides as required.       DISEASE/MEDICATION-SPECIFIC INFORMATION        N/A    SPECIALTY MEDICATION ADHERENCE     Medication Adherence    Patient reported X missed doses in the last month: 0  Specialty Medication: mycophenolate 180 MG EC tablet (MYFORTIC)  Patient is on additional specialty medications: Yes  Additional Specialty Medications: tacrolimus 1 MG capsule (PROGRAF)  Patient Reported Additional Medication X Missed Doses in the Last Month: 0  Patient is on more than two specialty medications: No  Any gaps in refill history greater than 2 weeks in the last 3 months: no  Demonstrates understanding of importance of adherence: yes  Informant: patient  Adherence tools used: patient uses a pill box to manage medications  Confirmed plan for next specialty medication refill: delivery by pharmacy  Refills needed for supportive medications: not needed          Refill Coordination    Has the Patients' Contact Information Changed: No  Is the Shipping Address Different: No         Were doses missed due to medication being on hold? No    mycophenolate 180 mg: 1 days of medicine on hand   tacrolimus 1  mg: 1 days of medicine on hand       REFERRAL TO PHARMACIST     Referral to the pharmacist: Not needed      [facility]     Shipping address confirmed in Epic.       Delivery Scheduled: Yes, Expected medication delivery date: 06/10/23.     Medication will be delivered via Same Day Courier to the prescription address in Epic WAM.    Kerby Less   [facility]  Specialty Technician

## 2023-06-10 LAB — SURGICAL PATHOLOGY

## 2023-06-10 MED FILL — TACROLIMUS 1 MG CAPSULE, IMMEDIATE-RELEASE: ORAL | 30 days supply | Qty: 240 | Fill #3

## 2023-06-10 MED FILL — MYCOPHENOLATE SODIUM 180 MG TABLET,DELAYED RELEASE: ORAL | 30 days supply | Qty: 180 | Fill #3

## 2023-06-11 ENCOUNTER — Ambulatory Visit: Payer: 59 | Admitting: Cardiovascular Disease

## 2023-06-11 NOTE — Progress Notes (Deleted)
Cardiology Office Note  Date:  06/11/2023   ID:  Theresa, Howard 02/17/1966, MRN [id number]  PCP:  Jackolyn Confer, MD   No chief complaint on file.   HPI:  [name] is a 57 year old woman with past medical history of  end-stage renal disease, on hemodialysis for the past 7 years,  history of seizure disorder,  hypertension,  stroke in March 2010 with intracranial bleed requiring intubation for at least one week at [place], who has been unable to tolerate anticoagulation,  history of narrow complex tachycardia with rate 160 beats per minute  11/02/2013, in the setting of upper respiratory infection, started on amiodarone. 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  at [facility]. She presents today for for evaluation of her heart arrhythmia  Previously seen by myself in clinic January 2016       EKG on today's visit shows normal sinus rhythm with rate 69 bpm, no significant ST or T-wave changes   Other past medical history She reports that she had severe allergies, sinus congestion, chest congestion on April 2. She was laying in bed when she had acute onset of tachycardia. Symptoms lasted 5 hours. She was not thinking clearly and finally went to the emergency room after family encouraged her to go. She had palpitations, shortness of breath, fatigue with malaise..  In the emergency room, she was given amiodarone infusion as it was unsure if she was in SVT or atrial flutter. Her rhythm broke and she was discharged on amiodarone by mouth twice a day.   PMH:   has a past medical history of Acute diverticulitis (08/21/2018), Arrhythmia, Atypical chest pain (08/09/2018), Cerebrovascular accident (HCC) (2010), Clotted renal dialysis AV graft (HCC), Compression of vein (08/16/2018), Dependence on hemodialysis (HCC) (11/17/2013), Diabetes mellitus  without complication (HCC), Ductal carcinoma in situ (DCIS) of left breast, End stage renal disease (HCC), GERD (gastroesophageal reflux disease), History of methicillin resistant Staphylococcus aureus infection, History of stress test, HIT (heparin-induced thrombocytopenia) (HCC), Hypertension, Mitral regurgitation, Renal transplant, status post (03/24/2014), RLS (restless legs syndrome), Seizure disorder (HCC), Seizures (HCC), Sepsis (HCC) (08/09/2016), and Stroke due to intracerebral hemorrhage (HCC) (11/17/2013).  PSH:    Past Surgical History:  Procedure Laterality Date   INSERTION OF DIALYSIS CATHETER     x 2   KIDNEY TRANSPLANT  03/2014   right side.   MASTECTOMY PARTIAL / LUMPECTOMY Left 09/24/2015   MASTECTOMY PARTIAL / LUMPECTOMY Left 10/25/2015    Current Outpatient Medications  Medication Sig Dispense Refill   aspirin EC 81 MG tablet Take 81 mg by mouth daily.     atorvastatin (LIPITOR) 80 MG tablet Take 1 tablet (80 mg total) by mouth daily. 90 tablet 3   carvedilol (COREG) 12.5 MG tablet Take 12.5 mg by mouth 2 (two) times daily with a meal.      cetirizine (ZYRTEC) 10 MG tablet Take 1 tablet (10 mg total) by mouth daily as needed for allergies or rhinitis. (Patient taking differently: Take 10 mg by mouth daily.) 90 tablet 3   cholecalciferol (VITAMIN D) 25 MCG (1000 UT) tablet Take 1,000 Units by mouth daily.     cyclobenzaprine (FLEXERIL) 10 MG tablet Take 1 tablet (10 mg total) by mouth 3 (three) times daily as needed for muscle spasms. 21 tablet 0   docusate sodium (COLACE) 100 MG capsule Take  100 mg by mouth daily.     fluticasone (FLONASE) 50 MCG/ACT nasal spray Two sprays each NOSTRIL twice a day. 16 g 0   gabapentin (NEURONTIN) 300 MG capsule Take 2 capsules (600 mg total) by mouth 3 (three) times daily. (Patient taking differently: Take 300-600 mg by mouth See admin instructions. Take 1 capsule (300mg ) by mouth every morning and 1 capsule (300mg ) and lunchtime then take 2  capsules (600mg ) by mouth every night at bedtime) 560 capsule 1   glipiZIDE (GLUCOTROL) 5 MG tablet Take 5 mg by mouth daily before breakfast.     HYDROcodone-acetaminophen (NORCO/VICODIN) 5-325 MG tablet Take 1-2 tablets by mouth every 6 (six) hours as needed for moderate pain or severe pain (no more than 6 tabs daily). 15 tablet 0   levETIRAcetam (KEPPRA) 500 MG tablet Take 500 mg by mouth 2 (two) times daily.      magnesium oxide (MAG-OX) 400 MG tablet Take 400 mg by mouth daily.      mycophenolate (MYFORTIC) 180 MG EC tablet Take 360 mg by mouth 3 (three) times daily.      nitroGLYCERIN (NITROSTAT) 0.4 MG SL tablet Place 1 tablet (0.4 mg total) under the tongue every 5 (five) minutes x 3 doses as needed for chest pain. 30 tablet 2   omeprazole (PRILOSEC) 20 MG capsule Take 1 capsule (20 mg total) by mouth daily as needed (GERD or heartburn). 90 capsule 0   prochlorperazine (COMPAZINE) 5 MG tablet Take 1 tablet (5 mg total) by mouth every 6 (six) hours as needed (headache). 12 tablet 0   sodium bicarbonate 650 MG tablet Take 1,300 mg by mouth 3 (three) times daily.      tacrolimus (PROGRAF) 1 MG capsule Take 4 mg by mouth 2 (two) times daily.      tamoxifen (NOLVADEX) 20 MG tablet Take 20 mg by mouth daily.     valACYclovir (VALTREX) 1000 MG tablet Take 1 tablet (1,000 mg total) by mouth 2 (two) times daily. 20 tablet 0   No current facility-administered medications for this visit.     Allergies:   Heparin, Penicillin g, Ibuprofen, Other, and Penicillins   Social History:  The patient  reports that she quit smoking about 14 years ago. Her smoking use included cigarettes. She has never used smokeless tobacco. She reports current alcohol use of about 2.0 standard drinks of alcohol per week. She reports current drug use. Drugs: "Crack" cocaine and Marijuana.   Family History:   family history includes Aneurysm in her brother; Cancer in her mother and paternal grandmother; Diabetes in her  maternal grandfather and maternal grandmother; Hearing loss in her maternal grandmother; Heart attack in her father and mother; Heart failure in her maternal grandmother; Hyperlipidemia in her father and mother; Hypertension in her father and mother; Kidney failure in her sister; Other in her sister; Stroke in her maternal grandfather and paternal grandfather.    Review of Systems: ROS   PHYSICAL EXAM: VS:  LMP 03/03/2016 (Approximate)  , BMI There is no height or weight on file to calculate BMI. GEN: Well nourished, well developed, in no acute distress HEENT: normal Neck: no JVD, carotid bruits, or masses Cardiac: RRR; no murmurs, rubs, or gallops,no edema  Respiratory:  clear to auscultation bilaterally, normal work of breathing GI: soft, nontender, nondistended, + BS MS: no deformity or atrophy Skin: warm and dry, no rash Neuro:  Strength and sensation are intact Psych: euthymic mood, full affect    Recent Labs: 04/09/2023:  TSH 0.861 04/21/2023: Magnesium 2.5 04/23/2023: ALT 12; Hemoglobin 11.8; Platelets 217 05/18/2023: BUN 40; Creatinine, Ser 2.19; Potassium 4.9; Sodium 136    Lipid Panel Lab Results  Component Value Date   CHOL 166 04/20/2023   HDL 39 (L) 04/20/2023   LDLCALC 103 (H) 04/20/2023   TRIG 135 04/20/2023      Wt Readings from Last 3 Encounters:  06/08/23 168 lb 11.2 oz (76.5 kg)  05/18/23 169 lb (76.7 kg)  04/22/23 165 lb (74.8 kg)       ASSESSMENT AND PLAN:  Problem List Items Addressed This Visit   None    Disposition:   F/U  12 months   Total encounter time more than 30 minutes  Greater than 50% was spent in counseling and coordination of care with the patient    Signed, Dossie Arbour, M.D., Ph.D. [facility] [place], [place] [phone]

## 2023-06-18 ENCOUNTER — Encounter: Payer: Self-pay | Admitting: *Deleted

## 2023-06-18 ENCOUNTER — Ambulatory Visit: Payer: Self-pay

## 2023-06-18 NOTE — Telephone Encounter (Signed)
This encounter was created in error - please disregard.
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
## 2023-06-21 ENCOUNTER — Telehealth: Payer: Self-pay | Admitting: Pediatrics

## 2023-06-21 NOTE — Telephone Encounter (Signed)
See other phone encounter.  

## 2023-06-21 NOTE — Telephone Encounter (Signed)
Copied from CRM [id number]. Topic: General - Inquiry >> Jun 21, 2023  3:51 PM Lennox Pippins wrote: Patient has called back returning [place] CMA call. Please contact patient back.

## 2023-06-21 NOTE — Telephone Encounter (Signed)
Called and notified patient of Dr. Carie Caddy message. Patient states she is not experiencing the incontinence anymore. States she is still having neck, shoulder, and back pains. States she is scheduled to see Neurology tomorrow and will follow up with them. Advised for patient to give [place] a call and schedule a follow up after she she's them if she needs to.

## 2023-06-21 NOTE — Telephone Encounter (Signed)
Called and LVM asking for patient to please return my call.  

## 2023-06-22 ENCOUNTER — Ambulatory Visit: Payer: 59 | Admitting: Obstetrics and Gynecology

## 2023-06-22 DIAGNOSIS — N870 Mild cervical dysplasia: Secondary | ICD-10-CM

## 2023-07-14 ENCOUNTER — Encounter: Payer: Self-pay | Admitting: Pediatrics

## 2023-07-21 DIAGNOSIS — Z79899 Other long term (current) drug therapy: Principal | ICD-10-CM

## 2023-07-21 DIAGNOSIS — K219 Gastro-esophageal reflux disease without esophagitis: Principal | ICD-10-CM

## 2023-07-21 DIAGNOSIS — D0512 Intraductal carcinoma in situ of left breast: Principal | ICD-10-CM

## 2023-07-21 DIAGNOSIS — Z940 Kidney transplant status: Principal | ICD-10-CM

## 2023-07-21 DIAGNOSIS — I10 Essential (primary) hypertension: Principal | ICD-10-CM

## 2023-07-21 DIAGNOSIS — B009 Herpesviral infection, unspecified: Principal | ICD-10-CM

## 2023-07-21 MED ORDER — VALACYCLOVIR 500 MG TABLET
ORAL_TABLET | Freq: Every day | ORAL | 0 refills | 0.00 days
Start: 2023-07-21 — End: ?

## 2023-07-21 MED ORDER — GLIPIZIDE 5 MG TABLET
ORAL_TABLET | Freq: Every day | ORAL | 0 refills | 0.00 days
Start: 2023-07-21 — End: ?

## 2023-07-21 MED ORDER — OMEPRAZOLE 20 MG CAPSULE,DELAYED RELEASE
ORAL_CAPSULE | 0 refills | 0.00 days
Start: 2023-07-21 — End: ?

## 2023-07-21 MED ORDER — TAMOXIFEN 20 MG TABLET
ORAL_TABLET | Freq: Every day | ORAL | 0 refills | 0.00 days
Start: 2023-07-21 — End: ?

## 2023-07-21 MED ORDER — CARVEDILOL 12.5 MG TABLET
ORAL_TABLET | Freq: Two times a day (BID) | ORAL | 0 refills | 0.00 days
Start: 2023-07-21 — End: ?

## 2023-07-22 MED ORDER — VALACYCLOVIR 500 MG TABLET
ORAL_TABLET | Freq: Every day | ORAL | 0 refills | 30.00 days | Status: CP
Start: 2023-07-22 — End: ?

## 2023-07-22 MED ORDER — OMEPRAZOLE 20 MG CAPSULE,DELAYED RELEASE
ORAL_CAPSULE | 0 refills | 0.00 days | Status: CP
Start: 2023-07-22 — End: ?

## 2023-07-22 MED ORDER — TAMOXIFEN 20 MG TABLET
ORAL_TABLET | Freq: Every day | ORAL | 0 refills | 90.00 days | Status: CP
Start: 2023-07-22 — End: ?

## 2023-07-22 MED ORDER — GLIPIZIDE 5 MG TABLET
ORAL_TABLET | Freq: Every day | ORAL | 0 refills | 30.00 days | Status: CP
Start: 2023-07-22 — End: 2024-07-21

## 2023-07-22 MED ORDER — CARVEDILOL 12.5 MG TABLET
ORAL_TABLET | Freq: Two times a day (BID) | ORAL | 0 refills | 30.00 days | Status: CP
Start: 2023-07-22 — End: ?

## 2023-07-22 NOTE — Unmapped (Signed)
 Pt is requesting refill    Most recent clinic visit: Visit date not found  Next clinic visit:  Visit date not found

## 2023-07-29 MED FILL — MYCOPHENOLATE SODIUM 180 MG TABLET,DELAYED RELEASE: ORAL | 30 days supply | Qty: 180 | Fill #4

## 2023-07-29 MED FILL — TACROLIMUS 1 MG CAPSULE, IMMEDIATE-RELEASE: ORAL | 30 days supply | Qty: 240 | Fill #4

## 2023-07-29 NOTE — Unmapped (Signed)
[facility] Specialty and Home Delivery Pharmacy Refill Coordination Note    Specialty Medication(s) to be Shipped:   Transplant: mycophenolate mofetil 180mg  and tacrolimus 1mg     Other medication(s) to be shipped: No additional medications requested for fill at this time     Nancy Bowman, DOB: 1966-07-25  Phone: [phone] (home) [phone] (work)      All above HIPAA information was verified with patient.     Was a Nurse, learning disability used for this call? No    Completed refill call assessment today to schedule patient's medication shipment from the [facility] and Home Delivery Pharmacy  [phone]).  All relevant notes have been reviewed.     Specialty medication(s) and dose(s) confirmed: Regimen is correct and unchanged.   Changes to medications: Franceen reports no changes at this time.  Changes to insurance: No  New side effects reported not previously addressed with a pharmacist or physician: None reported  Questions for the pharmacist: No    Confirmed patient received a Conservation officer, historic buildings and a Surveyor, mining with first shipment. The patient will receive a drug information handout for each medication shipped and additional FDA Medication Guides as required.       DISEASE/MEDICATION-SPECIFIC INFORMATION        N/A    SPECIALTY MEDICATION ADHERENCE     Medication Adherence    Patient reported X missed doses in the last month: 0  Specialty Medication: mycophenolate 180 MG EC tablet (MYFORTIC)  Patient is on additional specialty medications: Yes  Additional Specialty Medications: tacrolimus 1 MG capsule (PROGRAF)  Patient Reported Additional Medication X Missed Doses in the Last Month: 0  Patient is on more than two specialty medications: No  Any gaps in refill history greater than 2 weeks in the last 3 months: no  Demonstrates understanding of importance of adherence: yes  Informant: patient  Reliability of informant: reliable  Provider-estimated medication adherence level: good  Patient is at risk for Non-Adherence: No  Reasons for non-adherence: no problems identified  Adherence tools used: patient uses a pill box to manage medications  Confirmed plan for next specialty medication refill: delivery by pharmacy  Refills needed for supportive medications: not needed          Refill Coordination    Has the Patients' Contact Information Changed: No  Is the Shipping Address Different: No         Were doses missed due to medication being on hold? No    tacrolimus 1 mg: 0 days of medicine on hand   Mycophenolate  180 mg: 0 days of medicine on hand       REFERRAL TO PHARMACIST     Referral to the pharmacist: Not needed      [facility]     Shipping address confirmed in Epic.       Delivery Scheduled: Yes, Expected medication delivery date: 12/26.     Medication will be delivered via Same Day Courier to the prescription address in Epic WAM.    Antonietta Barcelona   [facility] Special[facility] and [facility] Delivery Pharm[facility]  Specialty Technician

## 2023-08-27 DIAGNOSIS — K219 Gastro-esophageal reflux disease without esophagitis: Principal | ICD-10-CM

## 2023-08-27 DIAGNOSIS — I10 Essential (primary) hypertension: Principal | ICD-10-CM

## 2023-08-27 DIAGNOSIS — Z940 Kidney transplant status: Principal | ICD-10-CM

## 2023-08-27 DIAGNOSIS — Z79899 Other long term (current) drug therapy: Principal | ICD-10-CM

## 2023-08-27 DIAGNOSIS — B009 Herpesviral infection, unspecified: Principal | ICD-10-CM

## 2023-08-27 MED ORDER — CARVEDILOL 12.5 MG TABLET
ORAL_TABLET | Freq: Two times a day (BID) | ORAL | 0 refills | 30.00 days | Status: CP
Start: 2023-08-27 — End: ?

## 2023-08-27 MED ORDER — OMEPRAZOLE 20 MG CAPSULE,DELAYED RELEASE
ORAL_CAPSULE | 0 refills | 0.00 days | Status: CP
Start: 2023-08-27 — End: ?

## 2023-08-27 MED ORDER — GLIPIZIDE 5 MG TABLET
ORAL_TABLET | Freq: Every day | ORAL | 0 refills | 30.00 days | Status: CP
Start: 2023-08-27 — End: 2024-08-26

## 2023-08-27 MED ORDER — VALACYCLOVIR 500 MG TABLET
ORAL_TABLET | Freq: Every day | ORAL | 0 refills | 30.00 days | Status: CP
Start: 2023-08-27 — End: ?

## 2023-08-27 NOTE — Progress Notes (Deleted)
NO SHOW

## 2023-08-30 ENCOUNTER — Ambulatory Visit: Payer: 59 | Attending: Cardiovascular Disease | Admitting: Cardiovascular Disease

## 2023-08-30 DIAGNOSIS — E1122 Type 2 diabetes mellitus with diabetic chronic kidney disease: Secondary | ICD-10-CM

## 2023-08-30 DIAGNOSIS — N184 Chronic kidney disease, stage 4 (severe): Secondary | ICD-10-CM

## 2023-08-30 DIAGNOSIS — I471 Supraventricular tachycardia, unspecified: Secondary | ICD-10-CM

## 2023-08-30 NOTE — Unmapped (Signed)
[facility] Specialty and Home Delivery Pharmacy Refill Coordination Note    Specialty Medication(s) to be Shipped:   Transplant: mycophenolate mofetil 180mg  and tacrolimus 1mg     Other medication(s) to be shipped: No additional medications requested for fill at this time     Nancy Bowman, DOB: 12/14/1965  Phone: [phone] (home) [phone] (work)      All above HIPAA information was verified with patient.     Was a Nurse, learning disability used for this call? No    Completed refill call assessment today to schedule patient's medication shipment from the [facility]  [phone]).  All relevant notes have been reviewed.     Specialty medication(s) and dose(s) confirmed: Regimen is correct and unchanged.   Changes to medications: Nancy Bowman reports no changes at this time.  Changes to insurance: No  New side effects reported not previously addressed with a pharmacist or physician: None reported  Questions for the pharmacist: No    Confirmed patient received a Conservation officer, historic buildings and a Surveyor, mining with first shipment. The patient will receive a drug information handout for each medication shipped and additional FDA Medication Guides as required.       DISEASE/MEDICATION-SPECIFIC INFORMATION        N/A    SPECIALTY MEDICATION ADHERENCE     Medication Adherence    Patient reported X missed doses in the last month: 0  Specialty Medication: mycophenolate 180 MG EC tablet (MYFORTIC)  Patient is on additional specialty medications: Yes  Additional Specialty Medications: tacrolimus 1 MG capsule (PROGRAF)  Patient Reported Additional Medication X Missed Doses in the Last Month: 0  Patient is on more than two specialty medications: No  Informant: patient  Adherence tools used: patient uses a pill box to manage medications              Were doses missed due to medication being on hold? No    Mycophenolate 180 mg: 0 days of medicine on hand   Tacrolimus 1 mg: 0 days of medicine on hand REFERRAL TO PHARMACIST     Referral to the pharmacist: Not needed      [facility]     Shipping address confirmed in Epic.       Delivery Scheduled: Yes, Expected medication delivery date: 08/31/23.     Medication will be delivered via Same Day Courier to the prescription address in Epic WAM.    [name], PharmD   [facility] Specialty and Home Delivery Pharmacy  Specialty Pharmacist

## 2023-08-31 MED FILL — MYCOPHENOLATE SODIUM 180 MG TABLET,DELAYED RELEASE: ORAL | 30 days supply | Qty: 180 | Fill #5

## 2023-08-31 MED FILL — TACROLIMUS 1 MG CAPSULE, IMMEDIATE-RELEASE: ORAL | 30 days supply | Qty: 240 | Fill #5

## 2023-10-05 MED FILL — MYCOPHENOLATE SODIUM 180 MG TABLET,DELAYED RELEASE: ORAL | 30 days supply | Qty: 180 | Fill #6

## 2023-10-05 MED FILL — TACROLIMUS 1 MG CAPSULE, IMMEDIATE-RELEASE: ORAL | 30 days supply | Qty: 240 | Fill #6

## 2023-10-05 NOTE — Unmapped (Signed)
 [facility] Specialty and Home Delivery Pharmacy Refill Coordination Note    Specialty Medication(s) to be Shipped:   Transplant: mycophenolate mofetil 180 mg and tacrolimus 1mg     Other medication(s) to be shipped: No additional medications requested for fill at this time     Nancy Bowman, DOB: 19-Apr-1966  Phone: [phone] (home) [phone] (work)      All above HIPAA information was verified with patient.     Was a Nurse, learning disability used for this call? No    Completed refill call assessment today to schedule patient's medication shipment from the [facility] Delivery Pharm[facility]  [phone]).  All relevant notes have been reviewed.     Specialty medication(s) and dose(s) confirmed: Regimen is correct and unchanged.   Changes to medications: Shaquasia reports no changes at this time.  Changes to insurance: No  New side effects reported not previously addressed with a pharmacist or physician: None reported  Questions for the pharmacist: No    Confirmed patient received a Conservation officer, historic buildings and a Surveyor, mining with first shipment. The patient will receive a drug information handout for each medication shipped and additional FDA Medication Guides as required.       DISEASE/MEDICATION-SPECIFIC INFORMATION        N/A    SPECIALTY MEDICATION ADHERENCE     Medication Adherence    Patient reported X missed doses in the last month: 0  Specialty Medication: mycophenolate 180 MG EC tablet (MYFORTIC)  Patient is on additional specialty medications: Yes  Additional Specialty Medications: tacrolimus 1 MG capsule (PROGRAF)  Patient Reported Additional Medication X Missed Doses in the Last Month: 0  Patient is on more than two specialty medications: No  Adherence tools used: patient uses a pill box to manage medications              Were doses missed due to medication being on hold? No    mycophenolate 180  mg: 0 doses of medicine on hand   tacrolimus 1 mg: 0 days of medicine on hand       REFERRAL TO PHARMACIST Referral to the pharmacist: Not needed      [facility]     Shipping address confirmed in Epic.       Delivery Scheduled: Yes, Expected medication delivery date: 10/05/2023.     Medication will be delivered via Same Day Courier to the prescription address in Epic WAM.    Quintella Reichert   [facility] Specialty and Home Delivery Pharmacy  Specialty Technician

## 2023-10-11 DIAGNOSIS — Z79899 Other long term (current) drug therapy: Principal | ICD-10-CM

## 2023-10-11 DIAGNOSIS — G629 Polyneuropathy, unspecified: Principal | ICD-10-CM

## 2023-10-11 DIAGNOSIS — K219 Gastro-esophageal reflux disease without esophagitis: Principal | ICD-10-CM

## 2023-10-11 DIAGNOSIS — D0512 Intraductal carcinoma in situ of left breast: Principal | ICD-10-CM

## 2023-10-11 DIAGNOSIS — N186 End stage renal disease: Principal | ICD-10-CM

## 2023-10-11 DIAGNOSIS — B009 Herpesviral infection, unspecified: Principal | ICD-10-CM

## 2023-10-11 DIAGNOSIS — E559 Vitamin D deficiency, unspecified: Principal | ICD-10-CM

## 2023-10-11 DIAGNOSIS — Z940 Kidney transplant status: Principal | ICD-10-CM

## 2023-10-11 DIAGNOSIS — I10 Essential (primary) hypertension: Principal | ICD-10-CM

## 2023-10-11 DIAGNOSIS — E872 Metabolic acidosis: Principal | ICD-10-CM

## 2023-10-11 MED ORDER — SODIUM BICARBONATE 650 MG TABLET
ORAL_TABLET | 11 refills | 0.00 days | Status: CP
Start: 2023-10-11 — End: ?

## 2023-10-11 MED ORDER — CARVEDILOL 12.5 MG TABLET
ORAL_TABLET | Freq: Two times a day (BID) | ORAL | 11 refills | 30.00 days | Status: CP
Start: 2023-10-11 — End: 2024-10-10

## 2023-10-11 MED ORDER — TAMOXIFEN 20 MG TABLET
ORAL_TABLET | Freq: Every day | ORAL | 0 refills | 0.00 days
Start: 2023-10-11 — End: ?

## 2023-10-11 MED ORDER — LEVETIRACETAM 500 MG TABLET
ORAL_TABLET | Freq: Two times a day (BID) | ORAL | 11 refills | 30.00 days | Status: CP
Start: 2023-10-11 — End: 2024-10-10

## 2023-10-11 MED ORDER — GLIPIZIDE 5 MG TABLET
ORAL_TABLET | Freq: Every day | ORAL | 11 refills | 30.00 days | Status: CP
Start: 2023-10-11 — End: 2024-10-10

## 2023-10-11 MED ORDER — MAGNESIUM OXIDE 400 MG (241.3 MG MAGNESIUM) TABLET
ORAL_TABLET | Freq: Every day | ORAL | 11 refills | 30.00 days | Status: CP
Start: 2023-10-11 — End: 2024-10-10

## 2023-10-11 MED ORDER — VALACYCLOVIR 500 MG TABLET
ORAL_TABLET | Freq: Every day | ORAL | 11 refills | 30.00 days | Status: CP
Start: 2023-10-11 — End: 2024-10-10

## 2023-10-11 MED ORDER — GABAPENTIN 300 MG CAPSULE
ORAL_CAPSULE | 4 refills | 0.00 days | Status: CP
Start: 2023-10-11 — End: ?

## 2023-10-11 MED ORDER — OMEPRAZOLE 20 MG CAPSULE,DELAYED RELEASE
ORAL_CAPSULE | Freq: Every day | ORAL | 11 refills | 30.00 days | Status: CP
Start: 2023-10-11 — End: 2024-10-10

## 2023-10-11 MED ORDER — ASPIRIN 81 MG CHEWABLE TABLET
ORAL_TABLET | Freq: Every day | ORAL | 11 refills | 30.00 days | Status: CP
Start: 2023-10-11 — End: 2024-10-10

## 2023-10-11 MED ORDER — CHOLECALCIFEROL (VITAMIN D3) 25 MCG (1,000 UNIT) CAPSULE
ORAL_CAPSULE | Freq: Every day | ORAL | 11 refills | 30.00 days | Status: CP
Start: 2023-10-11 — End: 2024-10-10

## 2023-10-11 MED ORDER — CETIRIZINE 5 MG CHEWABLE TABLET
ORAL_TABLET | Freq: Every day | ORAL | 11 refills | 30.00 days | Status: CP
Start: 2023-10-11 — End: 2024-10-10

## 2023-10-11 NOTE — Unmapped (Signed)
 Pt is requesting refill    Most recent clinic visit: Visit date not found  Next clinic visit:  Visit date not found

## 2023-10-12 MED ORDER — TAMOXIFEN 20 MG TABLET
ORAL_TABLET | Freq: Every day | ORAL | 0 refills | 90.00 days | Status: CP
Start: 2023-10-12 — End: ?

## 2023-10-12 NOTE — Unmapped (Signed)
 Patient called and stated that she is unable to call her coordinator directly with her phone.  She stated she needs a refill for Flonase and Atorvastatin sent to her local pharmacy--[facility].  Primary coordinator notified.

## 2023-10-13 DIAGNOSIS — Z940 Kidney transplant status: Principal | ICD-10-CM

## 2023-10-13 DIAGNOSIS — Z79899 Other long term (current) drug therapy: Principal | ICD-10-CM

## 2023-10-13 MED ORDER — FLUTICASONE PROPIONATE 50 MCG/ACTUATION NASAL SPRAY,SUSPENSION
4 refills | 0.00 days | Status: CP
Start: 2023-10-13 — End: ?

## 2023-10-13 MED ORDER — CETIRIZINE 5 MG TABLET
ORAL_TABLET | Freq: Every day | ORAL | 11 refills | 30.00 days | Status: CP
Start: 2023-10-13 — End: 2024-10-12

## 2023-10-13 MED ORDER — ATORVASTATIN 40 MG TABLET
ORAL_TABLET | Freq: Every day | ORAL | 11 refills | 30 days | Status: CP
Start: 2023-10-13 — End: 2024-10-12

## 2023-10-13 NOTE — Unmapped (Signed)
 Spoke with pt again on phone today. She would like atorvastatin, flonase and zyrtec (non chewable) sent in to local pharmacy. TNC spoke with pt 2 days ago and she asked for all meds except tac and MMF to be sent to local pharmacy for refill.

## 2023-10-27 ENCOUNTER — Ambulatory Visit: Admitting: Obstetrics and Gynecology

## 2023-11-01 ENCOUNTER — Encounter: Payer: Self-pay | Admitting: Obstetrics and Gynecology

## 2023-11-09 NOTE — Unmapped (Signed)
 [facility] Specialty and Home Delivery Pharmacy Refill Coordination Note    Specialty Medication(s) to be Shipped:   Transplant: Myfortic 180mg  and Prograf 1mg     Other medication(s) to be shipped: No additional medications requested for fill at this time     Nancy Bowman, DOB: 07/12/1966  Phone: [phone] (home) [phone] (work)      All above HIPAA information was verified with patient.     Was a Nurse, learning disability used for this call? No    Completed refill call assessment today to schedule patient's medication shipment from the [facility] and Home Delivery Pharmacy  [phone]).  All relevant notes have been reviewed.     Specialty medication(s) and dose(s) confirmed: Regimen is correct and unchanged.   Changes to medications: [name] reports no changes at this time.  Changes to insurance: No  New side effects reported not previously addressed with a pharmacist or physician: None reported  Questions for the pharmacist: No    Confirmed patient received a Conservation officer, historic buildings and a Surveyor, mining with first shipment. The patient will receive a drug information handout for each medication shipped and additional FDA Medication Guides as required.       DISEASE/MEDICATION-SPECIFIC INFORMATION        N/A    SPECIALTY MEDICATION ADHERENCE     Medication Adherence    Patient reported X missed doses in the last month: 0  Specialty Medication: mycophenolate 180 MG EC tablet (MYFORTIC)  Patient is on additional specialty medications: Yes  Additional Specialty Medications: tacrolimus 1 MG capsule (PROGRAF)  Patient Reported Additional Medication X Missed Doses in the Last Month: 0  Adherence tools used: patient uses a pill box to manage medications              Were doses missed due to medication being on hold? No    mycophenolate 180 MG EC tablet (MYFORTIC)  : 2 doses of medicine on hand   tacrolimus 1 MG capsule (PROGRAF)  : 2 doses of medicine on hand       REFERRAL TO PHARMACIST     Referral to the pharmacist: Not needed      SHIPPING     Shipping address confirmed in Epic.     Cost and Payment: Patient has a $0 copay, payment information is not required.    Delivery Scheduled: Yes, Expected medication delivery date: 11/10/23.     Medication will be delivered via Same Day Courier to the prescription address in Epic WAM.    Nancy Bowman   [facility] Specialty and Home Delivery Pharmacy  Specialty Technician

## 2023-11-10 DIAGNOSIS — Z79899 Other long term (current) drug therapy: Principal | ICD-10-CM

## 2023-11-10 DIAGNOSIS — Z940 Kidney transplant status: Principal | ICD-10-CM

## 2023-11-10 DIAGNOSIS — E1369 Other specified diabetes mellitus with other specified complication: Principal | ICD-10-CM

## 2023-11-10 MED ORDER — ONETOUCH ULTRA TEST STRIPS
ORAL_STRIP | 0 refills | 0.00 days
Start: 2023-11-10 — End: ?

## 2023-11-10 MED FILL — TACROLIMUS 1 MG CAPSULE, IMMEDIATE-RELEASE: ORAL | 30 days supply | Qty: 240 | Fill #7

## 2023-11-10 MED FILL — MYCOPHENOLATE SODIUM 180 MG TABLET,DELAYED RELEASE: ORAL | 30 days supply | Qty: 180 | Fill #7

## 2023-11-11 DIAGNOSIS — Z940 Kidney transplant status: Principal | ICD-10-CM

## 2023-11-11 MED ORDER — ONETOUCH ULTRA TEST STRIPS
ORAL_STRIP | 0 refills | 0.00 days | Status: CP
Start: 2023-11-11 — End: 2024-02-06

## 2023-11-13 DIAGNOSIS — R569 Unspecified convulsions: Secondary | ICD-10-CM | POA: Diagnosis not present

## 2023-11-16 ENCOUNTER — Ambulatory Visit: Payer: Self-pay | Admitting: Pediatrics

## 2023-11-19 ENCOUNTER — Ambulatory Visit: Admitting: Cardiology

## 2023-11-30 ENCOUNTER — Ambulatory Visit: Payer: 59 | Admitting: Cardiovascular Disease

## 2023-12-06 DIAGNOSIS — E1369 Other specified diabetes mellitus with other specified complication: Principal | ICD-10-CM

## 2023-12-06 DIAGNOSIS — Z940 Kidney transplant status: Principal | ICD-10-CM

## 2023-12-06 DIAGNOSIS — Z79899 Other long term (current) drug therapy: Principal | ICD-10-CM

## 2023-12-06 MED ORDER — ONETOUCH ULTRA TEST STRIPS
ORAL_STRIP | 0 refills | 0.00000 days | Status: CP
Start: 2023-12-06 — End: 2024-03-02

## 2023-12-07 NOTE — Unmapped (Signed)
 Pt called and left message that she needs a refill on lidocaine patches.   Pt has not been seen in clinic by Dr. Helga Loan for over 5 years.  TNC called pt back, no answer. VM left that states that she has not been seen in 5 years and we will not be able to fill her medications except for transplant meds and some of her BP medications. She will need to follow up with PCP or neurology for her medications.

## 2023-12-13 NOTE — Unmapped (Signed)
 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     Was a Nurse, learning disability used for this call? No    Completed refill call assessment today to schedule patient's medication shipment from the [facility]  [phone]).  All relevant notes have been reviewed.     Specialty medication(s) and dose(s) confirmed: Regimen is correct and unchanged.   Changes to medications: Antigone reports no changes at this time.  Changes to insurance: No  New side effects reported not previously addressed with a pharmacist or physician: None reported  Questions for the pharmacist: No    Confirmed patient received a Conservation officer, historic buildings and a Surveyor, mining with first shipment. The patient will receive a drug information handout for each medication shipped and additional FDA Medication Guides as required.       DISEASE/MEDICATION-SPECIFIC INFORMATION        N/A    SPECIALTY MEDICATION ADHERENCE     Medication Adherence    Patient reported X missed doses in the last month: 0  Specialty Medication: mycophenolate 180 MG EC tablet (MYFORTIC)  Patient is on additional specialty medications: Yes  Additional Specialty Medications: tacrolimus 1 MG capsule (PROGRAF)  Patient Reported Additional Medication X Missed Doses in the Last Month: 0  Adherence tools used: patient uses a pill box to manage medications              Were doses missed due to medication being on hold? No    mycophenolate 180 MG EC tablet (MYFORTIC): 1 days of medicine on hand   tacrolimus 1 MG capsule (PROGRAF): 1 days of medicine on hand     REFERRAL TO PHARMACIST     Referral to the pharmacist: Not needed      [facility]     Shipping address confirmed in Epic.     Cost and Payment: Patient has a $0 copay, payment information is not required.    Delivery Scheduled: Yes, Expected medication delivery date: 12/14/23.     Medication will be delivered via Same Day Courier to the prescription address in Epic WAM.    Acheron Sugg   [facility]  Specialty Technician

## 2023-12-14 MED FILL — MYCOPHENOLATE SODIUM 180 MG TABLET,DELAYED RELEASE: ORAL | 30 days supply | Qty: 180 | Fill #8

## 2023-12-14 MED FILL — TACROLIMUS 1 MG CAPSULE, IMMEDIATE-RELEASE: ORAL | 30 days supply | Qty: 240 | Fill #8

## 2023-12-17 ENCOUNTER — Ambulatory Visit: Attending: Cardiology | Admitting: Cardiology

## 2024-01-04 ENCOUNTER — Telehealth: Payer: Self-pay

## 2024-01-04 NOTE — Telephone Encounter (Signed)
 Ok for E2C2 to review.  Attempted to reach patient to schedule Annual Medicare Wellness Visit. If she returns call please transfer to office.

## 2024-01-07 DIAGNOSIS — D0512 Intraductal carcinoma in situ of left breast: Principal | ICD-10-CM

## 2024-01-07 MED ORDER — TAMOXIFEN 20 MG TABLET
ORAL_TABLET | Freq: Every day | ORAL | 0 refills | 0.00000 days
Start: 2024-01-07 — End: ?

## 2024-01-10 MED ORDER — TAMOXIFEN 20 MG TABLET
ORAL_TABLET | Freq: Every day | ORAL | 0 refills | 90.00000 days | Status: CP
Start: 2024-01-10 — End: ?

## 2024-01-17 ENCOUNTER — Telehealth: Payer: Self-pay | Admitting: Pediatrics

## 2024-01-17 NOTE — Telephone Encounter (Signed)
 Copied from CRM [id number]. Topic: Medicare AWV >> Jan 17, 2024 10:26 AM Juliana Ocean wrote: Reason for CRM: LVM 01/17/2024 to confirm AWV 01/18/2024 at w/NHA Chloe Counter; Care Guide Ambulatory Clinical Support Yancey l [facility] Direct Dial: [phone]

## 2024-01-17 NOTE — Unmapped (Signed)
 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     Was a Nurse, learning disability used for this call? No    Completed refill call assessment today to schedule patient's medication shipment from the [facility] Delivery Pharm[facility]  [phone]).  All relevant notes have been reviewed.     Specialty medication(s) and dose(s) confirmed: Regimen is correct and unchanged.   Changes to medications: Nancy Bowman reports starting the following medications: Nortriptyline  Changes to insurance: No  New side effects reported not previously addressed with a pharmacist or physician: None reported  Questions for the pharmacist: No    Confirmed patient received a Conservation officer, historic buildings and a Surveyor, mining with first shipment. The patient will receive a drug information handout for each medication shipped and additional FDA Medication Guides as required.       DISEASE/MEDICATION-SPECIFIC INFORMATION        N/A    SPECIALTY MEDICATION ADHERENCE     Medication Adherence    Patient reported X missed doses in the last month: 0  Specialty Medication: tacrolimus 1 MG capsule (PROGRAF)  Patient is on additional specialty medications: Yes  Additional Specialty Medications: mycophenolate 180 MG EC tablet (MYFORTIC)  Patient Reported Additional Medication X Missed Doses in the Last Month: 0  Patient is on more than two specialty medications: No  Adherence tools used: patient uses a pill box to manage medications              Were doses missed due to medication being on hold? No    tacrolimus 1 MG capsule (PROGRAF) : 1 days of medicine on hand   mycophenolate 180 MG EC tablet (MYFORTIC) : 1 days of medicine on hand       REFERRAL TO PHARMACIST     Referral to the pharmacist: Not needed      [facility]     Shipping address confirmed in Epic.     Cost and Payment: Patient has a $0 copay, payment information is not required.    Delivery Scheduled: Yes, Expected medication delivery date: 01/18/24.     Medication will be delivered via Same Day Courier to the prescription address in Epic WAM.    [name]   [facility]  Specialty Technician

## 2024-01-18 ENCOUNTER — Ambulatory Visit (INDEPENDENT_AMBULATORY_CARE_PROVIDER_SITE_OTHER): Admitting: Emergency Medicine

## 2024-01-18 MED FILL — MYCOPHENOLATE SODIUM 180 MG TABLET,DELAYED RELEASE: ORAL | 30 days supply | Qty: 180 | Fill #9

## 2024-01-18 MED FILL — TACROLIMUS 1 MG CAPSULE, IMMEDIATE-RELEASE: ORAL | 30 days supply | Qty: 240 | Fill #9

## 2024-01-18 NOTE — Progress Notes (Signed)
   Subjective:   A user error has taken place: encounter opened in error, closed for administrative reasons.  Tried calling multiple times (>6) to complete AWV. Patient's cell service kept dropping the call. RS for 01/25/24 @ 10am.

## 2024-01-25 ENCOUNTER — Ambulatory Visit (INDEPENDENT_AMBULATORY_CARE_PROVIDER_SITE_OTHER): Admitting: Emergency Medicine

## 2024-01-25 VITALS — Ht 65.5 in | Wt 189.0 lb

## 2024-01-25 DIAGNOSIS — Z0000 Encounter for general adult medical examination without abnormal findings: Secondary | ICD-10-CM

## 2024-01-25 DIAGNOSIS — Z1211 Encounter for screening for malignant neoplasm of colon: Secondary | ICD-10-CM

## 2024-01-25 NOTE — Patient Instructions (Signed)
 [name] , Thank you for taking time out of your busy schedule to complete your Annual Wellness Visit with me. I enjoyed our conversation and look forward to speaking with you again next year. I, as well as your care team,  appreciate your ongoing commitment to your health goals. Please review the following plan we discussed and let me know if I can assist you in the future. Your Game plan/ To Do List    Referrals: I have placed a referral to [facility] (ph# [phone] a screening colonoscopy. Someone from their office should call you, if you haven't heard from the office you've been referred to, please reach out to them at the phone provided.   Follow up Visits: Next Medicare AWV with our clinical staff: 01/30/25 @ 10:00am (PHONE VISIT)   Have you seen your provider in the last 6 months (3 months if uncontrolled diabetes)? No Next Office Visit with your provider: 01/31/24 @ 3:00pm with Dr. Herold  Clinician Recommendations: Call to schedule your mammogram at your earliest convenience.  Aim for 30 minutes of exercise or brisk walking, 6-8 glasses of water, and 5 servings of fruits and vegetables each day.       This is a list of the screening recommended for you and due dates:  Health Maintenance  Topic Date Due   Hepatitis B Vaccine (1 of 3 - 19+ 3-dose series) Never done   Zoster (Shingles) Vaccine (1 of 2) Never done   Colon Cancer Screening  Never done   Mammogram  01/15/2020   COVID-19 Vaccine (5 - 2024-25 season) 04/04/2023   Pneumococcal Vaccination (3 of 3 - PCV20 or PCV21) 08/10/2023   Hemoglobin A1C  10/18/2023   Flu Shot  03/03/2024   Yearly kidney health urinalysis for diabetes  04/19/2024   Eye exam for diabetics  04/29/2024   Complete foot exam   05/09/2024   Yearly kidney function blood test for diabetes  05/17/2024   Medicare Annual Wellness Visit  01/24/2025   DTaP/Tdap/Td vaccine (2 - Td or Tdap) 03/13/2025   Pap with HPV screening  05/17/2028   Hepatitis C  Screening  Completed   HIV Screening  Completed   HPV Vaccine  Aged Out   Meningitis B Vaccine  Aged Out    Advanced directives: (Provided) Advance directive discussed with you today. I have provided a copy for you to complete at home and have notarized. Once this is complete, please bring a copy in to our office so we can scan it into your chart.  Advance Care Planning is important because it:  [x]  Makes sure you receive the medical care that is consistent with your values, goals, and preferences  [x]  It provides guidance to your family and loved ones and reduces their decisional burden about whether or not they are making the right decisions based on your wishes.  Follow the link provided in your after visit summary or read over the paperwork we have mailed to you to help you started getting your Advance Directives in place. If you need assistance in completing these, please reach out to us  so that we can help you!  See attachments for Preventive Care and Fall Prevention Tips.   Fall Prevention in the Home, Adult Falls can cause injuries and affect people of all ages. There are many simple things that you can do to make your home safe and to help prevent falls. If you need it, ask for help making these changes. What actions can  I take to prevent falls? General information Use good lighting in all rooms. Make sure to: Replace any light bulbs that burn out. Turn on lights if it is dark and use night-lights. Keep items that you use often in easy-to-reach places. Lower the shelves around your home if needed. Move furniture so that there are clear paths around it. Do not keep throw rugs or other things on the floor that can make you trip. If any of your floors are uneven, fix them. Add color or contrast paint or tape to clearly mark and help you see: Grab bars or handrails. First and last steps of staircases. Where the edge of each step is. If you use a ladder or stepladder: Make sure  that it is fully opened. Do not climb a closed ladder. Make sure the sides of the ladder are locked in place. Have someone hold the ladder while you use it. Know where your pets are as you move through your home. What can I do in the bathroom?     Keep the floor dry. Clean up any water that is on the floor right away. Remove soap buildup in the bathtub or shower. Buildup makes bathtubs and showers slippery. Use non-skid mats or decals on the floor of the bathtub or shower. Attach bath mats securely with double-sided, non-slip rug tape. If you need to sit down while you are in the shower, use a non-slip stool. Install grab bars by the toilet and in the bathtub and shower. Do not use towel bars as grab bars. What can I do in the bedroom? Make sure that you have a light by your bed that is easy to reach. Do not use any sheets or blankets on your bed that hang to the floor. Have a firm bench or chair with side arms that you can use for support when you get dressed. What can I do in the kitchen? Clean up any spills right away. If you need to reach something above you, use a sturdy step stool that has a grab bar. Keep electrical cables out of the way. Do not use floor polish or wax that makes floors slippery. What can I do with my stairs? Do not leave anything on the stairs. Make sure that you have a light switch at the top and the bottom of the stairs. Have them installed if you do not have them. Make sure that there are handrails on both sides of the stairs. Fix handrails that are broken or loose. Make sure that handrails are as long as the staircases. Install non-slip stair treads on all stairs in your home if they do not have carpet. Avoid having throw rugs at the top or bottom of stairs, or secure the rugs with carpet tape to prevent them from moving. Choose a carpet design that does not hide the edge of steps on the stairs. Make sure that carpet is firmly attached to the stairs. Fix any  carpet that is loose or worn. What can I do on the outside of my home? Use bright outdoor lighting. Repair the edges of walkways and driveways and fix any cracks. Clear paths of anything that can make you trip, such as tools or rocks. Add color or contrast paint or tape to clearly mark and help you see high doorway thresholds. Trim any bushes or trees on the main path into your home. Check that handrails are securely fastened and in good repair. Both sides of all steps should have handrails. Install  guardrails along the edges of any raised decks or porches. Have leaves, snow, and ice cleared regularly. Use sand, salt, or ice melt on walkways during winter months if you live where there is ice and snow. In the garage, clean up any spills right away, including grease or oil spills. What other actions can I take? Review your medicines with your health care provider. Some medicines can make you confused or feel dizzy. This can increase your chance of falling. Wear closed-toe shoes that fit well and support your feet. Wear shoes that have rubber soles and low heels. Use a cane, walker, scooter, or crutches that help you move around if needed. Talk with your provider about other ways that you can decrease your risk of falls. This may include seeing a physical therapist to learn to do exercises to improve movement and strength. Where to find more information Centers for Disease Control and Prevention, [employer]: TonerPromos.no [employer] on Aging: BaseRingTones.pl National Institute on Aging: BaseRingTones.pl Contact a health care provider if: You are afraid of falling at home. You feel weak, drowsy, or dizzy at home. You fall at home. Get help right away if you: Lose consciousness or have trouble moving after a fall. Have a fall that causes a head injury. These symptoms may be an emergency. Get help right away. Call 911. Do not wait to see if the symptoms will go away. Do not drive yourself to the  hospital. This information is not intended to replace advice given to you by your health care provider. Make sure you discuss any questions you have with your health care provider. Document Revised: 03/23/2022 Document Reviewed: 03/23/2022 Elsevier Patient Education  2024 [employer].

## 2024-01-25 NOTE — Progress Notes (Signed)
 Subjective:   [name] is a 58 y.o. who presents for a Medicare Wellness preventive visit.  As a reminder, Annual Wellness Visits don't include a physical exam, and some assessments may be limited, especially if this visit is performed virtually. We may recommend an in-person follow-up visit with your provider if needed.  Visit Complete: Virtual I connected with  Britnie D Mazzie on 01/25/24 by a audio enabled telemedicine application and verified that I am speaking with the correct person using two identifiers.  Patient Location: Home  Provider Location: Home Office  I discussed the limitations of evaluation and management by telemedicine. The patient expressed understanding and agreed to proceed.  Vital Signs: Because this visit was a virtual/telehealth visit, some criteria may be missing or patient reported. Any vitals not documented were not able to be obtained and vitals that have been documented are patient reported.  VideoDeclined- This patient declined Librarian, academic. Therefore the visit was completed with audio only.  Persons Participating in Visit: Patient.  AWV Questionnaire: No: Patient Medicare AWV questionnaire was not completed prior to this visit.  Cardiac Risk Factors include: diabetes mellitus;hypertension;obesity (BMI >30kg/m2)     Objective:    Today's Vitals   01/25/24 0956 01/25/24 0957  Weight: 189 lb (85.7 kg)   Height: 5' 5.5 (1.664 m)   PainSc:  9    Body mass index is 30.97 kg/m.     01/25/2024   10:16 AM 04/22/2023   11:22 PM 04/09/2023    3:22 PM 03/07/2023   12:06 AM 05/20/2021    1:29 PM 03/25/2020    1:54 PM 12/05/2019   11:47 AM  Advanced Directives  Does Patient Have a Medical Advance Directive? No No No No No No No  Would patient like information on creating a medical advance directive? Yes (MAU/Ambulatory/Procedural Areas - Information given) No - Patient declined No - Patient declined   No - Patient  declined No - Patient declined    Current Medications (verified) Outpatient Encounter Medications as of 01/25/2024  Medication Sig   aspirin  EC 81 MG tablet Take 81 mg by mouth daily.   atorvastatin  (LIPITOR) 80 MG tablet Take 1 tablet (80 mg total) by mouth daily.   carvedilol  (COREG ) 12.5 MG tablet Take 12.5 mg by mouth 2 (two) times daily with a meal.    cetirizine  (ZYRTEC ) 10 MG tablet Take 1 tablet (10 mg total) by mouth daily as needed for allergies or rhinitis.   cholecalciferol  (VITAMIN D ) 25 MCG (1000 UT) tablet Take 1,000 Units by mouth daily.   docusate sodium  (COLACE) 100 MG capsule Take 100 mg by mouth daily. (Patient taking differently: Take 100 mg by mouth daily. prn)   fluticasone  (FLONASE ) 50 MCG/ACT nasal spray Two sprays each NOSTRIL twice a day.   gabapentin  (NEURONTIN ) 300 MG capsule Take 2 capsules (600 mg total) by mouth 3 (three) times daily. (Patient taking differently: Take 300-600 mg by mouth See admin instructions. Take 1 capsule (300mg ) by mouth every morning and 1 capsule (300mg ) and lunchtime then take 2 capsules (600mg ) by mouth every night at bedtime Taking 3 capsules in the morning, 2 in the afternoon and 3 at bedtime)   glipiZIDE  (GLUCOTROL ) 5 MG tablet Take 5 mg by mouth daily before breakfast.   magnesium  oxide (MAG-OX) 400 MG tablet Take 400 mg by mouth daily.    mycophenolate  (MYFORTIC ) 180 MG EC tablet Take 360 mg by mouth 3 (three) times daily.  nitroGLYCERIN  (NITROSTAT ) 0.4 MG SL tablet Place 1 tablet (0.4 mg total) under the tongue every 5 (five) minutes x 3 doses as needed for chest pain.   omeprazole  (PRILOSEC) 20 MG capsule Take 1 capsule (20 mg total) by mouth daily as needed (GERD or heartburn).   sodium bicarbonate  650 MG tablet Take 1,300 mg by mouth 3 (three) times daily.    tacrolimus  (PROGRAF ) 1 MG capsule Take 4 mg by mouth 2 (two) times daily.    tamoxifen  (NOLVADEX ) 20 MG tablet Take 20 mg by mouth daily.   valACYclovir  (VALTREX ) 1000 MG  tablet Take 1 tablet (1,000 mg total) by mouth 2 (two) times daily. (Patient taking differently: Take 1,000 mg by mouth 2 (two) times daily. Taking once per day)   cyclobenzaprine  (FLEXERIL ) 10 MG tablet Take 1 tablet (10 mg total) by mouth 3 (three) times daily as needed for muscle spasms. (Patient not taking: Reported on 01/25/2024)   HYDROcodone -acetaminophen  (NORCO/VICODIN) 5-325 MG tablet Take 1-2 tablets by mouth every 6 (six) hours as needed for moderate pain or severe pain (no more than 6 tabs daily). (Patient not taking: Reported on 01/25/2024)   levETIRAcetam  (KEPPRA ) 500 MG tablet Take 500 mg by mouth 2 (two) times daily.  (Patient not taking: Reported on 01/25/2024)   nortriptyline (PAMELOR) 10 MG capsule Take by mouth 2 (two) times daily. Takes 4 tablets at bedtime   prochlorperazine  (COMPAZINE ) 5 MG tablet Take 1 tablet (5 mg total) by mouth every 6 (six) hours as needed (headache). (Patient not taking: Reported on 01/25/2024)   No facility-administered encounter medications on file as of 01/25/2024.    Allergies (verified) Heparin , Penicillin g, Ibuprofen, Other, and Penicillins   History: Past Medical History:  Diagnosis Date   Acute diverticulitis 08/21/2018   Arrhythmia    Atypical chest pain 08/09/2018   Cerebrovascular accident [facility]) 2010   without any focal neurological deficits.    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 previously on dialysis on Tues, Thurs, & Sat x 7 yrs; b. s/p deceased donor transplant 03-26-2014    GERD (gastroesophageal reflux disease)    History of methicillin resistant Staphylococcus aureus infection    History of stress test    a. 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 01/2014: EF >55%, LVH, mild MR, dilated LA   Renal transplant, status post 03-26-2014   RLS (restless legs syndrome)    Seizure disorder (HCC)    Seizures (HCC)    Sepsis (HCC) 08/09/2016   Stroke due to intracerebral hemorrhage (HCC) 11/17/2013   Past Surgical History:  Procedure Laterality Date   INSERTION OF DIALYSIS CATHETER     x 2   KIDNEY TRANSPLANT  03/2014   right side.   MASTECTOMY PARTIAL / LUMPECTOMY Left 09/24/2015   MASTECTOMY PARTIAL / LUMPECTOMY Left 10/25/2015   Family History  Problem Relation Age of Onset   Heart attack Mother    Hypertension Mother    Hyperlipidemia Mother    Cancer Mother        breast   Heart attack Father    Hypertension Father    Hyperlipidemia Father    Kidney  failure Sister        half sister   Diabetes Maternal Grandmother    Hearing loss Maternal Grandmother    Heart failure Maternal Grandmother    Diabetes Maternal Grandfather    Stroke Maternal Grandfather    Cancer Paternal Grandmother        breast   Stroke Paternal Grandfather    Aneurysm Brother        Brain   Other Sister        Nerve problems   Social History   Socioeconomic History   Marital status: Single    Spouse name: Not on file   Number of children: 1   Years of education: Not on file   Highest education level: Not on file  Occupational History   Occupation: disability  Tobacco Use   Smoking status: Former    Current packs/day: 0.00    Average packs/day: 0.3 packs/day for 20.3 years (5.1 ttl pk-yrs)    Types: Cigarettes    Start date: 58    Quit date: 11/25/2008    Years since quitting: 15.1   Smokeless tobacco: Never  Vaping Use   Vaping status: Never Used  Substance and Sexual Activity   Alcohol use: Not Currently    Comment: wine monthly or less   Drug use: Yes    Types: Crack cocaine, Marijuana    Comment: quit cocaine in 2003, smokes marijuana twice a month   Sexual activity: Yes    Birth control/protection:  Post-menopausal  Other Topics Concern   Not on file  Social History Narrative   Not on file   Social Drivers of Health   Financial Resource Strain: Low Risk  (01/25/2024)   Overall Financial Resource Strain (CARDIA)    Difficulty of Paying Living Expenses: Not hard at all  Food Insecurity: No Food Insecurity (01/25/2024)   Hunger Vital Sign    Worried About Running Out of Food in the Last Year: Never true    Ran Out of Food in the Last Year: Never true  Transportation Needs: No Transportation Needs (01/25/2024)   PRAPARE - Administrator, Civil Service (Medical): No    Lack of Transportation (Non-Medical): No  Physical Activity: Insufficiently Active (01/25/2024)   Exercise Vital Sign    Days of Exercise per Week: 3 days    Minutes of Exercise per Session: 20 min  Stress: No Stress Concern Present (01/25/2024)   [employer] of Occupational Health - Occupational Stress Questionnaire    Feeling of Stress: Only a little  Social Connections: Moderately Isolated (01/25/2024)   Social Connection and Isolation Panel    Frequency of Communication with Friends and Family: More than three times a week    Frequency of Social Gatherings with Friends and Family: More than three times a week    Attends Religious Services: More than 4 times per year    Active Member of [employer] or Organizations: No    Attends Engineer, structural: Never    Marital Status: Never married    Tobacco Counseling Counseling given: Not Answered    Clinical Intake:  Pre-visit preparation completed: Yes  Pain : 0-10 Pain Score: 9  Pain Type: Chronic pain Pain Location: Back Pain Descriptors / Indicators: Aching     BMI - recorded: 30.97 Nutritional Status: BMI > 30  Obese Nutritional Risks: None Diabetes: Yes CBG done?: No Did pt. bring in CBG monitor from home?: No  Lab Results  Component Value Date  HGBA1C 6.7 (H) 04/20/2023   HGBA1C 7.6 (H) 12/05/2019   HGBA1C 6.6  08/16/2018     How often do you need to have someone help you when you read instructions, pamphlets, or other written materials from your doctor or pharmacy?: 1 - Never  Interpreter Needed?: No  Information entered by :: Vina Ned, CMA   Activities of Daily Living     01/25/2024    9:59 AM  In your present state of health, do you have any difficulty performing the following activities:  Hearing? 0  Vision? 0  Difficulty concentrating or making decisions? 1  Comment forgets sometimes  Walking or climbing stairs? 1  Comment uses cane and rolling walker  Dressing or bathing? 0  Doing errands, shopping? 0  Preparing Food and eating ? N  Using the Toilet? N  In the past six months, have you accidently leaked urine? N  Do you have problems with loss of bowel control? N  Managing your Medications? N  Managing your Finances? N  Housekeeping or managing your Housekeeping? N    Patient Care Team: Herold Hadassah SQUIBB, MD as PCP - General (Family Medicine) Perla Evalene PARAS, MD as Consulting Physician (Cardiology) Lenn Aran, MD as Radiation Oncologist (Radiation Oncology) Pa, [facility] (Optometry) True, Wonda LABOR, MD as Referring Physician (Nephrology) Lane Arthea BRAVO, MD as Referring Physician (Neurology)  I have updated your Care Teams any recent Medical Services you may have received from other providers in the past year.     Assessment:   This is a routine wellness examination for [name].  Hearing/Vision screen Hearing Screening - Comments:: Denies hearing loss Vision Screening - Comments:: Gets DM eye exams, [facility]   Goals Addressed             This Visit's Progress    Patient Stated       Control pain       Depression Screen     01/25/2024   10:12 AM 05/18/2023    2:30 PM 08/12/2017    4:30 PM 06/03/2017    3:50 PM 03/10/2017    9:53 AM 11/17/2016   11:03 AM 08/26/2016   10:02 AM  PHQ 2/9 Scores  PHQ - 2 Score 0 0 0 3 0 0  0  PHQ- 9 Score 4 4 0 11       Fall Risk     01/25/2024   10:17 AM 05/18/2023    2:30 PM 08/16/2018    2:45 PM 08/12/2017    4:30 PM 06/03/2017    3:50 PM  Fall Risk   Falls in the past year? 1 1 0  No  No   Number falls in past yr: 1 1     Injury with Fall? 1 0     Risk for fall due to : History of fall(s);Impaired balance/gait;Orthopedic patient      Follow up Falls evaluation completed;Education provided  Falls evaluation completed        Data saved with a previous flowsheet row definition    MEDICARE RISK AT HOME:  Medicare Risk at Home Any stairs in or around the home?: No If so, are there any without handrails?: No Home free of loose throw rugs in walkways, pet beds, electrical cords, etc?: Yes Adequate lighting in your home to reduce risk of falls?: Yes Life alert?: No Use of a cane, walker or w/c?: Yes (uses cane and rolling walker) Grab bars in the bathroom?: No Shower  chair or bench in shower?: No Elevated toilet seat or a handicapped toilet?: No  TIMED UP AND GO:  Was the test performed?  No  Cognitive Function: 6CIT completed        01/25/2024   10:19 AM  6CIT Screen  What Year? 4 points  What month? 0 points  What time? 0 points  Count back from 20 0 points  Months in reverse 0 points  Repeat phrase 0 points  Total Score 4 points    Immunizations Immunization History  Administered Date(s) Administered   Influenza,inj,Quad PF,6+ Mos 07/18/2014, 07/17/2015, 06/03/2017, 07/11/2020, 07/22/2022   Influenza,inj,quad, With Preservative 06/06/2018   Influenza-Unspecified 06/17/2016, 06/04/2018, 05/12/2021   Moderna Covid-19 Fall Seasonal Vaccine 43yrs & older 07/22/2022   Moderna Sars-Covid-2 Vaccination 09/27/2019, 10/25/2019, 07/11/2020   Pneumococcal Conjugate-13 02/26/2017   Pneumococcal Polysaccharide-23 08/09/2018   Tdap 03/14/2015    Screening Tests Health Maintenance  Topic Date Due   Hepatitis B Vaccines (1 of 3 - 19+ 3-dose series) Never  done   Zoster Vaccines- Shingrix (1 of 2) Never done   Colonoscopy  Never done   MAMMOGRAM  01/15/2020   COVID-19 Vaccine (5 - 2024-25 season) 04/04/2023   Pneumococcal Vaccine 10-51 Years old (3 of 3 - PCV20 or PCV21) 08/10/2023   HEMOGLOBIN A1C  10/18/2023   INFLUENZA VACCINE  03/03/2024   Diabetic kidney evaluation - Urine ACR  04/19/2024   OPHTHALMOLOGY EXAM  04/29/2024   FOOT EXAM  05/09/2024   Diabetic kidney evaluation - eGFR measurement  05/17/2024   Medicare Annual Wellness (AWV)  01/24/2025   DTaP/Tdap/Td (2 - Td or Tdap) 03/13/2025   Cervical Cancer Screening (HPV/Pap Cotest)  05/17/2028   Hepatitis C Screening  Completed   HIV Screening  Completed   HPV VACCINES  Aged Out   Meningococcal B Vaccine  Aged Out    Health Maintenance  Health Maintenance Due  Topic Date Due   Hepatitis B Vaccines (1 of 3 - 19+ 3-dose series) Never done   Zoster Vaccines- Shingrix (1 of 2) Never done   Colonoscopy  Never done   MAMMOGRAM  01/15/2020   COVID-19 Vaccine (5 - 2024-25 season) 04/04/2023   Pneumococcal Vaccine 69-24 Years old (3 of 3 - PCV20 or PCV21) 08/10/2023   HEMOGLOBIN A1C  10/18/2023   Health Maintenance Items Addressed: Referral sent to GI for colonoscopy, See Nurse Notes at the end of this note  Additional Screening:  Vision Screening: Recommended annual ophthalmology exams for early detection of glaucoma and other disorders of the eye. Would you like a referral to an eye doctor? No    Dental Screening: Recommended annual dental exams for proper oral hygiene  Community Resource Referral / Chronic Care Management: CRR required this visit?  No   CCM required this visit?  No   Plan:    I have personally reviewed and noted the following in the patient's chart:   Medical and social history Use of alcohol, tobacco or illicit drugs  Current medications and supplements including opioid prescriptions. Patient is not currently taking opioid  prescriptions. Functional ability and status Nutritional status Physical activity Advanced directives List of other physicians Hospitalizations, surgeries, and ER visits in previous 12 months Vitals Screenings to include cognitive, depression, and falls Referrals and appointments  In addition, I have reviewed and discussed with patient certain preventive protocols, quality metrics, and best practice recommendations. A written personalized care plan for preventive services as well as general preventive health recommendations were provided  to patient.   Vina Ned, CMA   01/25/2024   After Visit Summary: (Mail) Due to this being a telephonic visit, the after visit summary with patients personalized plan was offered to patient via mail   Notes:  6 CIT Score - 4 Needs pneumonia vaccine Placed referral for colonoscopy Needs MMG, order placed 04/20/23 Declined DM & Nutrition education referral

## 2024-01-31 ENCOUNTER — Ambulatory Visit: Admitting: Pediatrics

## 2024-01-31 ENCOUNTER — Encounter: Payer: Self-pay | Admitting: Pediatrics

## 2024-01-31 VITALS — BP 125/81 | HR 79 | Temp 98.4°F | Resp 16 | Ht 65.0 in | Wt 175.0 lb

## 2024-01-31 DIAGNOSIS — M25512 Pain in left shoulder: Secondary | ICD-10-CM

## 2024-01-31 DIAGNOSIS — M25511 Pain in right shoulder: Secondary | ICD-10-CM

## 2024-01-31 DIAGNOSIS — R7303 Prediabetes: Secondary | ICD-10-CM

## 2024-01-31 DIAGNOSIS — M5442 Lumbago with sciatica, left side: Secondary | ICD-10-CM | POA: Diagnosis not present

## 2024-01-31 DIAGNOSIS — I129 Hypertensive chronic kidney disease with stage 1 through stage 4 chronic kidney disease, or unspecified chronic kidney disease: Secondary | ICD-10-CM

## 2024-01-31 DIAGNOSIS — G8929 Other chronic pain: Secondary | ICD-10-CM | POA: Diagnosis not present

## 2024-01-31 DIAGNOSIS — M542 Cervicalgia: Secondary | ICD-10-CM

## 2024-01-31 DIAGNOSIS — T300 Burn of unspecified body region, unspecified degree: Secondary | ICD-10-CM
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
## 2024-02-06 ENCOUNTER — Encounter: Payer: Self-pay | Admitting: Pediatrics

## 2024-02-10 DIAGNOSIS — Z940 Kidney transplant status: Principal | ICD-10-CM

## 2024-02-10 MED ORDER — TACROLIMUS 1 MG CAPSULE, IMMEDIATE-RELEASE
ORAL_CAPSULE | Freq: Two times a day (BID) | ORAL | 11 refills | 30.00000 days | Status: CP
Start: 2024-02-10 — End: 2025-02-09
  Filled 2024-02-15: qty 240, 30d supply, fill #0

## 2024-02-10 MED ORDER — MYCOPHENOLATE SODIUM 180 MG TABLET,DELAYED RELEASE
ORAL_TABLET | Freq: Three times a day (TID) | ORAL | 11 refills | 30.00000 days | Status: CP
Start: 2024-02-10 — End: 2025-02-09
  Filled 2024-02-15: qty 180, 30d supply, fill #0

## 2024-02-10 NOTE — Unmapped (Signed)
 Pt request for RX Refill

## 2024-02-14 NOTE — Unmapped (Signed)
 02/14/2024 - Clinical assessment date was due on 03/19/2024. Reached out to Nancy Bowman and got approval to proceed with scheduling the patients refill and to move the clinical assessment date to match the next refill coordination.    [facility] Delivery [facility] Refill Coordination Note     Specialty Medication(s) to be Shipped:   Transplant: mycophenolate  mofetil 180mg  and tacrolimus  1mg     Other medication(s) to be shipped: No additional medications requested for fill at this time     Nancy Bowman, DOB: 1966-05-09  Phone: [phone] (home) [phone] (work)      All above HIPAA information was verified with patient.     Was a Nurse, learning disability used for this call? No    Completed refill call assessment today to schedule patient's medication shipment from the [facility]  [phone]).  All relevant notes have been reviewed.     Specialty medication(s) and dose(s) confirmed: Regimen is correct and unchanged.   Changes to medications: Nancy Bowman reports no changes at this time.  Changes to insurance: No  New side effects reported not previously addressed with a pharmacist or physician: None reported  Questions for the pharmacist: No    Confirmed patient received a Conservation officer, historic buildings and a Surveyor, mining with first shipment. The patient will receive a drug information handout for each medication shipped and additional FDA Medication Guides as required.       DISEASE/MEDICATION-SPECIFIC INFORMATION        N/A    SPECIALTY MEDICATION ADHERENCE     Medication Adherence    Patient reported X missed doses in the last month: 0  Specialty Medication: mycophenolate  180 MG EC tablet (MYFORTIC )  Patient is on additional specialty medications: Yes  Additional Specialty Medications: tacrolimus  1 MG capsule (PROGRAF )  Patient Reported Additional Medication X Missed Doses in the Last Month: 0  Patient is on more than two specialty medications: No  Adherence tools used: patient uses a pill box to manage medications              Were doses missed due to medication being on hold? No      mycophenolate  180 MG EC tablet (MYFORTIC ): 3-4 days of medicine on hand     tacrolimus  1 MG capsule (PROGRAF ): 3-4 days of medicine on hand     REFERRAL TO PHARMACIST     Referral to the pharmacist: Not needed      [facility]     Shipping address confirmed in Epic.     Cost and Payment: Patient has a $0 copay, payment information is not required.    Delivery Scheduled: Yes, Expected medication delivery date: 02/15/2024.     Medication will be delivered via Same Day Courier to the prescription address in Epic WAM.    [facility] Pharm[facility]  Specialty Technician

## 2024-02-18 DIAGNOSIS — M25512 Pain in left shoulder: Secondary | ICD-10-CM | POA: Diagnosis not present

## 2024-02-18 DIAGNOSIS — M24412 Recurrent dislocation, left shoulder: Secondary | ICD-10-CM | POA: Diagnosis not present

## 2024-02-18 DIAGNOSIS — M19011 Primary osteoarthritis, right shoulder: Secondary | ICD-10-CM | POA: Diagnosis not present

## 2024-02-18 DIAGNOSIS — Z940 Kidney transplant status: Secondary | ICD-10-CM | POA: Diagnosis not present

## 2024-03-03 DIAGNOSIS — Z79899 Other long term (current) drug therapy: Principal | ICD-10-CM

## 2024-03-03 DIAGNOSIS — N186 End stage renal disease: Principal | ICD-10-CM

## 2024-03-03 DIAGNOSIS — I10 Essential (primary) hypertension: Principal | ICD-10-CM

## 2024-03-03 DIAGNOSIS — E872 Metabolic acidosis: Principal | ICD-10-CM

## 2024-03-03 DIAGNOSIS — E559 Vitamin D deficiency, unspecified: Principal | ICD-10-CM

## 2024-03-03 DIAGNOSIS — E1369 Other specified diabetes mellitus with other specified complication: Principal | ICD-10-CM

## 2024-03-03 DIAGNOSIS — G629 Polyneuropathy, unspecified: Principal | ICD-10-CM

## 2024-03-03 DIAGNOSIS — K219 Gastro-esophageal reflux disease without esophagitis: Principal | ICD-10-CM

## 2024-03-03 DIAGNOSIS — Z940 Kidney transplant status: Principal | ICD-10-CM

## 2024-03-03 MED ORDER — ATORVASTATIN 40 MG TABLET
ORAL_TABLET | Freq: Every day | ORAL | 11 refills | 30.00000 days | Status: CP
Start: 2024-03-03 — End: 2025-03-03

## 2024-03-03 MED ORDER — GLIPIZIDE 5 MG TABLET
ORAL_TABLET | Freq: Every day | ORAL | 11 refills | 30.00000 days | Status: CP
Start: 2024-03-03 — End: 2025-03-03

## 2024-03-03 MED ORDER — ASPIRIN 81 MG CHEWABLE TABLET
ORAL_TABLET | Freq: Every day | ORAL | 11 refills | 30.00000 days | Status: CP
Start: 2024-03-03 — End: 2025-03-03

## 2024-03-03 MED ORDER — MAGNESIUM OXIDE 400 MG (241.3 MG MAGNESIUM) TABLET
ORAL_TABLET | Freq: Every day | ORAL | 11 refills | 30.00000 days | Status: CP
Start: 2024-03-03 — End: 2025-03-03

## 2024-03-03 MED ORDER — OMEPRAZOLE 20 MG CAPSULE,DELAYED RELEASE
ORAL_CAPSULE | Freq: Every day | ORAL | 11 refills | 30.00000 days | Status: CP
Start: 2024-03-03 — End: 2025-03-03

## 2024-03-03 MED ORDER — CARVEDILOL 12.5 MG TABLET
ORAL_TABLET | Freq: Two times a day (BID) | ORAL | 11 refills | 30.00000 days | Status: CP
Start: 2024-03-03 — End: 2025-03-03

## 2024-03-03 MED ORDER — FLUTICASONE PROPIONATE 50 MCG/ACTUATION NASAL SPRAY,SUSPENSION
NASAL | 4 refills | 0.00000 days | Status: CP
Start: 2024-03-03 — End: ?

## 2024-03-03 MED ORDER — ONETOUCH ULTRA TEST STRIPS
ORAL_STRIP | 3 refills | 0.00000 days | Status: CP
Start: 2024-03-03 — End: 2024-05-29

## 2024-03-03 MED ORDER — SODIUM BICARBONATE 650 MG TABLET
ORAL_TABLET | 11 refills | 0.00000 days | Status: CP
Start: 2024-03-03 — End: ?

## 2024-03-03 MED ORDER — CETIRIZINE 5 MG TABLET
ORAL_TABLET | Freq: Every day | ORAL | 11 refills | 30.00000 days | Status: CP
Start: 2024-03-03 — End: 2025-03-03

## 2024-03-03 MED ORDER — CHOLECALCIFEROL (VITAMIN D3) 25 MCG (1,000 UNIT) CAPSULE
ORAL_CAPSULE | Freq: Every day | ORAL | 11 refills | 30.00000 days | Status: CP
Start: 2024-03-03 — End: 2025-03-03

## 2024-03-03 MED ORDER — GABAPENTIN 300 MG CAPSULE
ORAL_CAPSULE | ORAL | 4 refills | 0.00000 days | Status: CP
Start: 2024-03-03 — End: ?

## 2024-03-03 NOTE — Unmapped (Signed)
 Spoke with Nancy Bowman- she asks if she can get MRI with contrast for shoulder- Reviewed with Dr Nancey- ok for pt to get.  Pt also asks for all of her meds to be refilled- this was done

## 2024-03-07 NOTE — Unmapped (Signed)
 [facility] Clinical Assessment & Refill Coordination Note    Darilyn Storbeck, DOB: 1965-12-19  Phone: [phone] (home) [phone] (work)    All above HIPAA information was verified with patient.     Was a Nurse, learning disability used for this call? No    Specialty Medication(s):   Transplant: mycophenolic acid 180mg  and tacrolimus  1mg      Current Medications[1]     Changes to medications: Jaymie reports starting the following medications: nortriptyline. Stopped levetiracetam     Medication list has been reviewed and updated in Epic: Yes    Allergies[2]    Changes to allergies: No    Allergies have been reviewed and updated in Epic: Yes    SPECIALTY MEDICATION ADHERENCE     tacrolimus  1 mg: 2 doses of medicine on hand   Mycophenolate   180 mg: 2 doses of medicine on hand       Medication Adherence    Patient reported X missed doses in the last month: 0  Specialty Medication: mycophenolate  180mg   Patient is on additional specialty medications: Yes  Additional Specialty Medications: Tacrolimus  1mg   Patient Reported Additional Medication X Missed Doses in the Last Month: 0  Patient is on more than two specialty medications: No  Informant: patient  Adherence tools used: patient uses a pill box to manage medications          Specialty medication(s) dose(s) confirmed: Regimen is correct and unchanged.     Are there any concerns with adherence? No    Adherence counseling provided? Not needed    CLINICAL MANAGEMENT AND INTERVENTION      Clinical Benefit Assessment:    Do you feel the medicine is effective or helping your condition? Patient declined to answer    Clinical Benefit counseling provided? Not needed    Adverse Effects Assessment:    Are you experiencing any side effects? No    Are you experiencing difficulty administering your medicine? No    Quality of Life Assessment:    Quality of Life    Rheumatology  Oncology  Dermatology  Cystic Fibrosis          How many days over the past month did your condition  keep you from your normal activities? For example, brushing your teeth or getting up in the morning. Patient declined to answer    Have you discussed this with your provider? Not needed    Acute Infection Status:    Acute infections noted within Epic:  No active infections    Patient reported infection: None    Therapy Appropriateness:    Is therapy appropriate based on current medication list, adverse reactions, adherence, clinical benefit and progress toward achieving therapeutic goals? Yes, therapy is appropriate and should be continued     Clinical Intervention:    Was an intervention completed as part of this clinical assessment? No    DISEASE/MEDICATION-SPECIFIC INFORMATION      N/A    Solid Organ Transplant: Not Applicable    PATIENT SPECIFIC NEEDS     Does the patient have any physical, cognitive, or cultural barriers? No    Is the patient high risk? Yes, patient is taking a REMS drug. Medication is dispensed in compliance with REMS program    Does the patient require physician intervention or other additional services (i.e., nutrition, smoking cessation, social work)? No    Does the patient have an additional or emergency contact listed in their chart? Yes    SOCIAL DETERMINANTS OF HEALTH  At the [facility], we have learned that life circumstances - like trouble affording food, housing, utilities, or transportation can affect the health of many of our patients.   That is why we wanted to ask: are you currently experiencing any life circumstances that are negatively impacting your health and/or quality of life? Patient declined to answer    Social Drivers of Health     Food Insecurity: No Food Insecurity (01/25/2024)    Received from [facility]    Hunger Vital Sign     Within the past 12 months, you worried that your food would run out before you got the money to buy more.: Never true     Within the past 12 months, the food you bought just didn't last and you didn't have money to get more.: Never true   Tobacco Use: Medium Risk (02/06/2024)    Received from [facility]    Patient History     Smoking Tobacco Use: Former     Smokeless Tobacco Use: Never     Passive Exposure: Not on file   Transportation Needs: No Transportation Needs (01/25/2024)    Received from [facility] - Transportation     In the past 12 months, has lack of transportation kept you from medical appointments or from getting medications?: No     In the past 12 months, has lack of transportation kept you from meetings, work, or from getting things needed for daily living?: No   Alcohol Use: Not on file   Housing: Not on file   Physical Activity: Insufficiently Active (01/25/2024)    Received from [facility]    Exercise Vital Sign     On average, how many days per week do you engage in moderate to strenuous exercise (like a brisk walk)?: 3 days     On average, how many minutes do you engage in exercise at this level?: 20 min   Utilities: Not At Risk (01/25/2024)    Received from [facility] Utilities     In the past 12 months has the electric, gas, oil, or water company threatened to shut off services in your home?: No   Stress: No Stress Concern Present (01/25/2024)    Received from [facility] - Occupational Stress Questionnaire     Do you feel stress - tense, restless, nervous, or anxious, or unable to sleep at night because your mind is troubled all the time - these days?: Only a little   Interpersonal Safety: Not on file   Substance Use: Not on file (06/10/2023)   Intimate Partner Violence: Not At Risk (01/25/2024)    Received from [facility]    Humiliation, Afraid, Rape, and Kick questionnaire     Within the last year, have you been afraid of your partner or ex-partner?: No     Within the last year, have you been humiliated or emotionally abused in other ways by your partner or ex-partner?: No     Within the last year, have you been kicked, hit, slapped, or otherwise physically hurt by your partner or ex-partner?: No     Within the last year, have you been raped or forced to have any kind of sexual activity by your partner or ex-partner?: No   Social Connections: Moderately Isolated (01/25/2024)    Received from [facility]    Social Connection and Isolation Panel     In  a typical week, how many times do you talk on the phone with family, friends, or neighbors?: More than three times a week     How often do you get together with friends or relatives?: More than three times a week     How often do you attend church or religious services?: More than 4 times per year     Do you belong to any clubs or organizations such as church groups, unions, fraternal or athletic groups, or school groups?: No     How often do you attend meetings of the clubs or organizations you belong to?: Never     Are you married, widowed, divorced, separated, never married, or living with a partner?: Never married   Physicist, medical Strain: Low Risk  (01/25/2024)    Received from [facility]    Overall Financial Resource Strain (CARDIA)     How hard is it for you to pay for the very basics like food, housing, medical care, and heating?: Not hard at all   Health Literacy: Adequate Health Literacy (01/25/2024)    Received from [facility]    B1300 Health Literacy     How often do you need to have someone help you when you read instructions, pamphlets, or other written material from your doctor or pharmacy?: Never   Internet Connectivity: Not on file       Would you be willing to receive help with any of the needs that you have identified today? Not applicable       SHIPPING     Specialty Medication(s) to be Shipped:   Transplant: mycophenolic acid 180mg  and tacrolimus  1mg     Other medication(s) to be shipped: No additional medications requested for fill at this time     Changes to insurance: No    Cost and Payment: Patient has a $0 copay, payment information is not required.    Delivery Scheduled: Yes, Expected medication delivery date: 03/28/24.     Medication will be delivered via Same Day Courier to the confirmed prescription address in [facility].    The patient will receive a drug information handout for each medication shipped and additional FDA Medication Guides as required.  Verified that patient has previously received a Conservation officer, historic buildings and a Surveyor, mining.    The patient or caregiver noted above participated in the development of this care plan and knows that they can request review of or adjustments to the care plan at any time.      All of the patient's questions and concerns have been addressed.    Aleck CHRISTELLA Gaskins, PharmD   [facility] Specialty Pharmacist         [1]   Current Outpatient Medications   Medication Sig Dispense Refill    aspirin  81 MG chewable tablet Chew 1 tablet (81 mg total) daily. 30 tablet 11    atorvastatin  (LIPITOR) 40 MG tablet Take 1 tablet (40 mg total) by mouth daily. 30 tablet 11    carvedilol  (COREG ) 12.5 MG tablet Take 1 tablet (12.5 mg total) by mouth two (2) times a day. TAKE 1 TABLET (12.5 MG TOTAL) BY MOUTH TWO (2) TIMES A DAY. 60 tablet 11    cholecalciferol , vitamin D3-25 mcg, 1,000 unit,, (VITAMIN D3) 25 mcg (1,000 unit) capsule Take 1 capsule (25 mcg total) by mouth daily. 30 capsule 11    gabapentin  (NEURONTIN ) 300 MG capsule Take 1 capsule (300 mg ) by mouth in the  morning and at noon, 2 capsules (600 mg) in the evening 360 capsule 4    glipiZIDE  (GLUCOTROL ) 5 MG tablet Take 1 tablet (5 mg total) by mouth daily. 30 tablet 11    magnesium  oxide (MAG-OX) 400 mg (241.3 mg elemental magnesium ) tablet Take 1 tablet (400 mg total) by mouth daily. 30 tablet 11    mycophenolate  (MYFORTIC ) 180 MG EC tablet Take 2 tablets (360 mg total) by mouth Three (3) times a day. 180 tablet 11    nortriptyline (PAMELOR) 10 MG capsule Take 4 capsules (40 mg total) by mouth nightly.      tacrolimus  (PROGRAF ) 1 MG capsule Take 4 capsules (4 mg total) by mouth two (2) times a day. 240 capsule 11    tamoxifen  (NOLVADEX ) 20 MG tablet Take 1 tablet (20 mg total) by mouth daily. 90 tablet 0    traMADoL  (ULTRAM ) 50 mg tablet Take 1 tablet (50 mg total) by mouth every eight (8) hours as needed for pain. 60 tablet 2    valACYclovir  (VALTREX ) 500 MG tablet Take 1 tablet (500 mg total) by mouth daily. 30 tablet 11    blood sugar diagnostic (ACCU-CHEK GUIDE TEST STRIPS) Strp by Other route four (4) times a day. 200 each 6    blood sugar diagnostic (ONETOUCH ULTRA TEST) Strp by Other route every four (4) hours. 700 strip 3    blood-glucose meter kit Use as instructed 1 each 0    blood-glucose meter kit Use as instructed. Accu-chek guide meter 1 each 0    cetirizine  (ZYRTEC ) 5 MG chewable tablet Chew 1 tablet (5 mg total) daily. 30 tablet 11    cetirizine  (ZYRTEC ) 5 MG tablet Take 1 tablet (5 mg total) by mouth daily. 30 tablet 11    fluticasone  propionate (FLONASE ) 50 mcg/actuation nasal spray Two sprays each nare twice a day 16 g 4    levETIRAcetam  (KEPPRA ) 500 MG tablet Take 1 tablet (500 mg total) by mouth two (2) times a day. TAKE 1 TABLET (500 MG TOTAL) BY MOUTH TWO (2) TIMES A DAY. (Patient not taking: Reported on 03/27/2024) 60 tablet 11    miscellaneous medical supply Misc 1 knee brace 1 each 0    omeprazole  (PRILOSEC) 20 MG capsule Take 1 capsule (20 mg total) by mouth daily. 30 capsule 11    oxyCODONE  (ROXICODONE ) 5 MG immediate release tablet Take 1 tablet (5 mg total) by mouth every four (4) hours as needed for pain for up to 5 doses. (Patient not taking: Reported on 03/27/2024) 5 tablet 0    sodium bicarbonate  650 mg tablet Take 2 tablets (1300 mg) three times a day. 180 tablet 11     No current facility-administered medications for this visit.   [2]   Allergies  Allergen Reactions    Heparin Analogues Other (See Comments)     Causing HITT    Ibuprofen      Can't take due to kidneys    Penicillins      Patient is unsure if she's allergic, was told she had a reaction as a child but thinks she's taking a cillin while sick without having a reaction.  Pt states she has taken amoxicillin in the past and did not have problem

## 2024-03-14 DIAGNOSIS — E139 Other specified diabetes mellitus without complications: Principal | ICD-10-CM

## 2024-03-14 DIAGNOSIS — Z794 Long term (current) use of insulin: Principal | ICD-10-CM

## 2024-03-14 MED ORDER — BLOOD-GLUCOSE METER KIT WRAPPER
ORAL | 0 refills | 0.00000 days | Status: CP
Start: 2024-03-14 — End: 2025-03-14

## 2024-03-14 MED ORDER — ACCU-CHEK GUIDE TEST STRIPS
Freq: Three times a day (TID) | 6 refills | 0.00000 days | Status: CP
Start: 2024-03-14 — End: 2025-03-14

## 2024-03-15 DIAGNOSIS — Z794 Long term (current) use of insulin: Principal | ICD-10-CM

## 2024-03-15 DIAGNOSIS — E139 Other specified diabetes mellitus without complications: Principal | ICD-10-CM

## 2024-03-15 MED ORDER — ACCU-CHEK GUIDE TEST STRIPS
Freq: Four times a day (QID) | 6 refills | 0.00000 days | Status: CP
Start: 2024-03-15 — End: 2025-03-15

## 2024-03-28 MED FILL — MYCOPHENOLATE SODIUM 180 MG TABLET,DELAYED RELEASE: ORAL | 30 days supply | Qty: 180 | Fill #1

## 2024-03-28 MED FILL — TACROLIMUS 1 MG CAPSULE, IMMEDIATE-RELEASE: ORAL | 30 days supply | Qty: 240 | Fill #1

## 2024-04-05 NOTE — Unmapped (Signed)
[employer] forms

## 2024-04-13 DIAGNOSIS — M24412 Recurrent dislocation, left shoulder: Secondary | ICD-10-CM | POA: Diagnosis not present

## 2024-04-13 DIAGNOSIS — H268 Other specified cataract: Secondary | ICD-10-CM | POA: Diagnosis not present

## 2024-04-13 DIAGNOSIS — S43014D Anterior dislocation of right humerus, subsequent encounter: Secondary | ICD-10-CM | POA: Diagnosis not present

## 2024-04-13 DIAGNOSIS — M19011 Primary osteoarthritis, right shoulder: Secondary | ICD-10-CM | POA: Diagnosis not present

## 2024-04-13 DIAGNOSIS — M19012 Primary osteoarthritis, left shoulder: Secondary | ICD-10-CM | POA: Diagnosis not present

## 2024-04-13 DIAGNOSIS — E119 Type 2 diabetes mellitus without complications: Secondary | ICD-10-CM | POA: Diagnosis not present

## 2024-04-13 DIAGNOSIS — H25013 Cortical age-related cataract, bilateral: Secondary | ICD-10-CM | POA: Diagnosis not present

## 2024-04-13 DIAGNOSIS — H2513 Age-related nuclear cataract, bilateral: Secondary | ICD-10-CM | POA: Diagnosis not present

## 2024-04-13 LAB — HM DIABETES EYE EXAM

## 2024-04-18 DIAGNOSIS — D0512 Intraductal carcinoma in situ of left breast: Principal | ICD-10-CM

## 2024-04-18 MED ORDER — TAMOXIFEN 20 MG TABLET
ORAL_TABLET | Freq: Every day | ORAL | 0 refills | 90.00000 days | Status: CP
Start: 2024-04-18 — End: ?

## 2024-04-24 NOTE — Unmapped (Signed)
 [facility] Delivery Pharm[facility] Refill Coordination Note    Specialty Medication(s) to be Shipped:   Transplant: mycophenolate  mofetil 180mg  and tacrolimus  1mg     Other medication(s) to be shipped: No additional medications requested for fill at this time    Specialty Medications not needed at this time: N/A     Nancy Bowman, DOB: 1965/10/12  Phone: [phone] (home) [phone] (work)      All above HIPAA information was verified with patient.     Was a Nurse, learning disability used for this call? No    Completed refill call assessment today to schedule patient's medication shipment from the [facility]  [phone]).  All relevant notes have been reviewed.     Specialty medication(s) and dose(s) confirmed: Regimen is correct and unchanged.   Changes to medications: Nancy Bowman reports no changes at this time.  Changes to insurance: No  New side effects reported not previously addressed with a pharmacist or physician: None reported  Questions for the pharmacist: No    Confirmed patient received a Conservation officer, historic buildings and a Surveyor, mining with first shipment. The patient will receive a drug information handout for each medication shipped and additional FDA Medication Guides as required.       DISEASE/MEDICATION-SPECIFIC INFORMATION        N/A    SPECIALTY MEDICATION ADHERENCE     Medication Adherence    Patient reported X missed doses in the last month: 0  Specialty Medication: tacrolimus  1 MG capsule (PROGRAF )  Patient is on additional specialty medications: Yes  Additional Specialty Medications: mycophenolate  180 MG EC tablet (MYFORTIC )  Patient Reported Additional Medication X Missed Doses in the Last Month: 0  Patient is on more than two specialty medications: No  Any gaps in refill history greater than 2 weeks in the last 3 months: no  Demonstrates understanding of importance of adherence: yes  Informant: patient  Adherence tools used: patient uses a pill box to manage medications  Confirmed plan for next specialty medication refill: delivery by pharmacy  Refills needed for supportive medications: not needed          Refill Coordination    Has the Patients' Contact Information Changed: No  Is the Shipping Address Different: No         Were doses missed due to medication being on hold? No    tacrolimus  1  mg: 4 days of medicine on hand   mycophenolate  180  mg: 4 days of medicine on hand       REFERRAL TO PHARMACIST     Referral to the pharmacist: Not needed      [facility]     Shipping address confirmed in Epic.     Cost and Payment: Patient has a $0 copay, payment information is not required.    Delivery Scheduled: Yes, Expected medication delivery date: 04/27/24.     Medication will be delivered via Next Day Courier to the prescription address in Epic WAM.    Nancy Bowman   [facility] Delivery [facility]  Specialty Technician

## 2024-04-26 DIAGNOSIS — H2511 Age-related nuclear cataract, right eye: Secondary | ICD-10-CM | POA: Diagnosis not present

## 2024-04-26 MED FILL — TACROLIMUS 1 MG CAPSULE, IMMEDIATE-RELEASE: ORAL | 30 days supply | Qty: 240 | Fill #2

## 2024-04-26 MED FILL — MYCOPHENOLATE SODIUM 180 MG TABLET,DELAYED RELEASE: ORAL | 30 days supply | Qty: 180 | Fill #2

## 2024-04-27 ENCOUNTER — Encounter: Payer: Self-pay | Admitting: Pediatrics

## 2024-04-27 ENCOUNTER — Ambulatory Visit (INDEPENDENT_AMBULATORY_CARE_PROVIDER_SITE_OTHER): Admitting: Pediatrics

## 2024-04-27 VITALS — BP 132/86 | HR 76 | Temp 98.0°F | Ht 65.0 in | Wt 175.0 lb

## 2024-04-27 DIAGNOSIS — Z7984 Long term (current) use of oral hypoglycemic drugs: Secondary | ICD-10-CM | POA: Diagnosis not present

## 2024-04-27 DIAGNOSIS — G629 Polyneuropathy, unspecified: Secondary | ICD-10-CM | POA: Diagnosis not present

## 2024-04-27 DIAGNOSIS — M25511 Pain in right shoulder: Secondary | ICD-10-CM

## 2024-04-27 DIAGNOSIS — Z1231 Encounter for screening mammogram for malignant neoplasm of breast: Secondary | ICD-10-CM | POA: Diagnosis not present

## 2024-04-27 DIAGNOSIS — G8929 Other chronic pain: Secondary | ICD-10-CM

## 2024-04-27 DIAGNOSIS — N183 Chronic kidney disease, stage 3 unspecified: Secondary | ICD-10-CM | POA: Diagnosis not present

## 2024-04-27 DIAGNOSIS — E1122 Type 2 diabetes mellitus with diabetic chronic kidney disease: Secondary | ICD-10-CM | POA: Diagnosis not present

## 2024-04-27 DIAGNOSIS — F419 Anxiety disorder, unspecified: Secondary | ICD-10-CM | POA: Diagnosis not present

## 2024-04-27 DIAGNOSIS — M25512 Pain in left shoulder: Secondary | ICD-10-CM | POA: Diagnosis not present

## 2024-04-27 MED ORDER — NORTRIPTYLINE HCL 10 MG PO CAPS: 40.0000 mg | ORAL_CAPSULE | Freq: Every day | ORAL | 0 refills | Status: DC

## 2024-04-27 MED ORDER — DULOXETINE HCL 20 MG PO CPEP
20.0000 mg | ORAL_CAPSULE | Freq: Every day | ORAL | 1 refills | Status: AC
Start: 2024-04-27 — End: ?

## 2024-04-27 MED ORDER — NORTRIPTYLINE HCL 10 MG PO CAPS: 10.0000 mg | ORAL_CAPSULE | Freq: Two times a day (BID) | ORAL | 1 refills | Status: DC
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
## 2024-04-28 ENCOUNTER — Ambulatory Visit: Payer: Self-pay | Admitting: Pediatrics

## 2024-04-28 LAB — RENAL FUNCTION PANEL
Albumin: 4.1 g/dL (ref 3.8–4.9)
BUN/Creatinine Ratio: 17 (ref 9–23)
BUN: 39 mg/dL — ABNORMAL HIGH (ref 6–24)
CO2: 18 mmol/L — ABNORMAL LOW (ref 20–29)
Calcium: 10 mg/dL (ref 8.7–10.2)
Chloride: 103 mmol/L (ref 96–106)
Creatinine, Ser: 2.29 mg/dL — ABNORMAL HIGH (ref 0.57–1.00)
Glucose: 137 mg/dL — ABNORMAL HIGH (ref 70–99)
Phosphorus: 2.8 mg/dL — ABNORMAL LOW (ref 3.0–4.3)
Potassium: 5.1 mmol/L (ref 3.5–5.2)
Sodium: 138 mmol/L (ref 134–144)
eGFR: 24 mL/min/1.73 — ABNORMAL LOW (ref >59)

## 2024-04-28 LAB — HEMOGLOBIN A1C
Est. average glucose Bld gHb Est-mCnc: 169 mg/dL
Hgb A1c MFr Bld: 7.5 % — ABNORMAL HIGH (ref 4.8–5.6)

## 2024-05-01 ENCOUNTER — Encounter: Payer: Self-pay | Admitting: Ophthalmology

## 2024-05-01 NOTE — Anesthesia Preprocedure Evaluation (Signed)
 Anesthesia Evaluation  Patient identified by MRN, date of birth, ID band Patient awake    Reviewed: Allergy & Precautions, H&P , NPO status , Patient's Chart, lab work & pertinent test results  Airway Mallampati: III  TM Distance: >3 FB Neck ROM: Full    Dental no notable dental hx.    Pulmonary neg pulmonary ROS, former smoker   Pulmonary exam normal breath sounds clear to auscultation       Cardiovascular hypertension, + Past MI  negative cardio ROS Normal cardiovascular exam+ dysrhythmias  Rhythm:Regular Rate:Normal     Neuro/Psych Seizures -,  PSYCHIATRIC DISORDERS Anxiety Depression     Neuromuscular disease CVA negative neurological ROS  negative psych ROS   GI/Hepatic negative GI ROS, Neg liver ROS,GERD  ,,  Endo/Other  negative endocrine ROSdiabetes    Renal/GU Renal diseasenegative Renal ROS  negative genitourinary   Musculoskeletal negative musculoskeletal ROS (+) Arthritis ,    Abdominal   Peds negative pediatric ROS (+)  Hematology negative hematology ROS (+)   Anesthesia Other Findings RLS (restless legs syndrome) Seizure disorder (HCC) History of methicillin resistant Staphylococcus aureus infection Cerebrovascular accident (HCC) Clotted renal dialysis  AV graft  Arrhythmia Renal transplant, status post  History of stress test Mitral regurgitation  Seizures (HCC) Hypertension  End stage renal disease (HCC) GERD (gastroesophageal reflux disease) 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 Sepsis (HCC) Diabetes mellitus without complication (HCC) Compression of vein Atypical chest pain Acute diverticulitis Dysrhythmia Arthritis Depression Anxiety Myocardial infarction (HCC) S/p cadaver renal transplant    Reproductive/Obstetrics negative OB ROS                               Anesthesia Physical Anesthesia Plan  ASA: 3  Anesthesia Plan: MAC   Post-op Pain Management:    Induction: Intravenous  PONV Risk Score and Plan:   Airway Management Planned: Natural Airway and Nasal Cannula  Additional Equipment:   Intra-op Plan:   Post-operative Plan:   Informed Consent: I have reviewed the patients History and Physical, chart, labs and discussed the procedure including the risks, benefits and alternatives for the proposed anesthesia with the patient or authorized representative who has indicated his/her understanding and acceptance.     Dental Advisory Given  Plan Discussed with: Anesthesiologist, CRNA and Surgeon  Anesthesia Plan Comments: (Patient consented for risks of anesthesia including but not limited to:  - adverse reactions to medications - damage to eyes, teeth, lips or other oral mucosa - nerve damage due to positioning  - sore throat or hoarseness - Damage to heart, brain, nerves, lungs, other parts of body or loss of life  Patient voiced understanding and assent.)         Anesthesia Quick Evaluation

## 2024-05-01 NOTE — Discharge Instructions (Signed)

## 2024-05-02 ENCOUNTER — Encounter: Payer: Self-pay | Admitting: Ophthalmology

## 2024-05-03 ENCOUNTER — Other Ambulatory Visit: Payer: Self-pay

## 2024-05-03 ENCOUNTER — Ambulatory Visit: Payer: Self-pay | Admitting: Anesthesiology

## 2024-05-03 ENCOUNTER — Ambulatory Visit
Admission: RE | Admit: 2024-05-03 | Discharge: 2024-05-03 | Disposition: A | Discharge status: Home / Self Care | Attending: Ophthalmology | Admitting: Ophthalmology

## 2024-05-03 ENCOUNTER — Encounter
Admission: RE | Disposition: A | Discharge status: Home / Self Care | Payer: Self-pay | Source: Home / Self Care | Attending: Ophthalmology

## 2024-05-03 ENCOUNTER — Encounter: Payer: Self-pay | Admitting: Ophthalmology

## 2024-05-03 DIAGNOSIS — Z992 Dependence on renal dialysis: Secondary | ICD-10-CM | POA: Insufficient documentation

## 2024-05-03 DIAGNOSIS — Z940 Kidney transplant status: Secondary | ICD-10-CM | POA: Diagnosis not present

## 2024-05-03 DIAGNOSIS — E1122 Type 2 diabetes mellitus with diabetic chronic kidney disease: Secondary | ICD-10-CM | POA: Insufficient documentation

## 2024-05-03 DIAGNOSIS — H2511 Age-related nuclear cataract, right eye: Secondary | ICD-10-CM | POA: Insufficient documentation

## 2024-05-03 DIAGNOSIS — E1136 Type 2 diabetes mellitus with diabetic cataract: Secondary | ICD-10-CM | POA: Diagnosis not present

## 2024-05-03 DIAGNOSIS — N186 End stage renal disease: Secondary | ICD-10-CM | POA: Diagnosis not present

## 2024-05-03 DIAGNOSIS — Z7984 Long term (current) use of oral hypoglycemic drugs: Secondary | ICD-10-CM | POA: Diagnosis not present

## 2024-05-03 DIAGNOSIS — Z87891 Personal history of nicotine dependence: Secondary | ICD-10-CM | POA: Diagnosis not present

## 2024-05-03 DIAGNOSIS — H268 Other specified cataract: Secondary | ICD-10-CM | POA: Diagnosis not present

## 2024-05-03 DIAGNOSIS — I471 Supraventricular tachycardia, unspecified: Secondary | ICD-10-CM | POA: Diagnosis not present

## 2024-05-03 DIAGNOSIS — I120 Hypertensive chronic kidney disease with stage 5 chronic kidney disease or end stage renal disease: Secondary | ICD-10-CM | POA: Insufficient documentation

## 2024-05-03 HISTORY — DX: Acute myocardial infarction, unspecified: I21.9

## 2024-05-03 HISTORY — PX: CATARACT EXTRACTION W/PHACO: SHX586

## 2024-05-03 HISTORY — DX: Unspecified osteoarthritis, unspecified site: M19.90

## 2024-05-03 HISTORY — DX: Depression, unspecified: F32.A

## 2024-05-03 HISTORY — DX: Anxiety disorder, unspecified: F41.9

## 2024-05-03 HISTORY — DX: Cardiac arrhythmia, unspecified: I49.9

## 2024-05-03 HISTORY — DX: Kidney transplant status: Z94.0

## 2024-05-03 LAB — GLUCOSE, CAPILLARY: Glucose-Capillary: 124 mg/dL — ABNORMAL HIGH (ref 70–99)

## 2024-05-03 SURGERY — PHACOEMULSIFICATION, CATARACT, WITH IOL INSERTION
Anesthesia: Monitor Anesthesia Care | Laterality: Right

## 2024-05-03 MED ORDER — TRYPAN BLUE 0.06 % IO SOSY
PREFILLED_SYRINGE | INTRAOCULAR | Status: DC | PRN
  Administered 2024-05-03: .5 mL via INTRAOCULAR

## 2024-05-03 MED ORDER — TETRACAINE HCL 0.5 % OP SOLN
1.0000 [drp] | OPHTHALMIC | Status: DC | PRN
  Administered 2024-05-03 (×3): 1 [drp] via OPHTHALMIC

## 2024-05-03 MED ORDER — MIDAZOLAM HCL 2 MG/2ML IJ SOLN
INTRAMUSCULAR | Status: AC
  Filled 2024-05-03: qty 2

## 2024-05-03 MED ORDER — BRIMONIDINE TARTRATE-TIMOLOL 0.2-0.5 % OP SOLN
OPHTHALMIC | Status: DC | PRN
  Administered 2024-05-03: 1 [drp] via OPHTHALMIC

## 2024-05-03 MED ORDER — FENTANYL CITRATE (PF) 100 MCG/2ML IJ SOLN
INTRAMUSCULAR | Status: AC
  Filled 2024-05-03: qty 2

## 2024-05-03 MED ORDER — ARMC OPHTHALMIC DILATING DROPS
1.0000 | OPHTHALMIC | Status: DC | PRN
  Administered 2024-05-03 (×3): 1 via OPHTHALMIC

## 2024-05-03 MED ORDER — LIDOCAINE HCL (PF) 2 % IJ SOLN
INTRAOCULAR | Status: DC | PRN
  Administered 2024-05-03: .5 mL

## 2024-05-03 MED ORDER — SIGHTPATH DOSE#1 BSS IO SOLN
INTRAOCULAR | Status: DC | PRN
  Administered 2024-05-03: 83 mL via OPHTHALMIC

## 2024-05-03 MED ORDER — MOXIFLOXACIN HCL 0.5 % OP SOLN
OPHTHALMIC | Status: DC | PRN
  Administered 2024-05-03: .2 mL via OPHTHALMIC

## 2024-05-03 MED ORDER — SIGHTPATH DOSE#1 BSS IO SOLN
INTRAOCULAR | Status: DC | PRN
  Administered 2024-05-03: 15 mL via INTRAOCULAR

## 2024-05-03 MED ORDER — LACTATED RINGERS IV SOLN: INTRAVENOUS | Status: DC

## 2024-05-03 MED ORDER — SIGHTPATH DOSE#1 NA HYALUR & NA CHOND-NA HYALUR IO KIT
PACK | INTRAOCULAR | Status: DC | PRN
  Administered 2024-05-03: 1 via OPHTHALMIC

## 2024-05-03 MED ORDER — FENTANYL CITRATE (PF) 100 MCG/2ML IJ SOLN
INTRAMUSCULAR | Status: DC | PRN
  Administered 2024-05-03 (×2): 50 ug via INTRAVENOUS

## 2024-05-03 MED ORDER — MIDAZOLAM HCL 2 MG/2ML IJ SOLN
INTRAMUSCULAR | Status: DC | PRN
  Administered 2024-05-03: 2 mg via INTRAVENOUS

## 2024-05-03 SURGICAL SUPPLY — 12 items
BNDG EYE OVAL 2 1/8 X 2 5/8 (GAUZE/BANDAGES/DRESSINGS) IMPLANT
CANNULA ANT/CHMB 27G (MISCELLANEOUS) IMPLANT
FEE CATARACT SUITE SIGHTPATH (MISCELLANEOUS) ×1 IMPLANT
GLOVE BIOGEL PI IND STRL 8 (GLOVE) ×1 IMPLANT
GLOVE SURG LX STRL 7.5 STRW (GLOVE) ×1 IMPLANT
GLOVE SURG SYN 6.5 PF PI BL (GLOVE) ×1 IMPLANT
LENS IOL CLRN CLEAR 21.0 (Intraocular Lens) IMPLANT
NDL FILTER BLUNT 18X1 1/2 (NEEDLE) ×1 IMPLANT
NEEDLE FILTER BLUNT 18X1 1/2 (NEEDLE) ×1 IMPLANT
PACK VIT ANT 23G (MISCELLANEOUS) IMPLANT
RING MALYGIN 7.0 (MISCELLANEOUS) IMPLANT
SYR 3ML LL SCALE MARK (SYRINGE) ×1 IMPLANT

## 2024-05-03 NOTE — Op Note (Signed)
 OPERATIVE NOTE  [name] [id number] 05/03/2024   PREOPERATIVE DIAGNOSIS:  Right Eye Cataract  Pre-Op Diagnosis Codes:     * Mature cataract [H26.8]          Mature (Total) Cataract Right Eye H25.89   POSTOPERATIVE DIAGNOSIS: Mature nuclear sclerotic cataract right eye          PROCEDURE:  Phacoemusification with posterior chamber intraocular lens placement of the right eye .  Vision Blue dye was used to stain the lens capsule.  LENS:   Implant Name Type Inv. Item Serial No. Manufacturer Lot No. LRB No. Used Action  LENS IOL CLRN CLEAR 21.0 - [id number] Intraocular Lens LENS IOL CLRN CLEAR 21.0 [id number] [employer]  Right 1 Implanted       ULTRASOUND TIME: CDE 4.04  SURGEON:  Anthony Fiacco, MD   ANESTHESIA:  Topical with tetracaine drops augmented with 1% preservative-free intracameral lidocaine .   COMPLICATIONS:  None.   DESCRIPTION OF PROCEDURE: The patient was identified in the holding room and transported to the operating room and placed in the supine position under the operating microscope.  The right eye was identified as the operative eye and it was prepped and draped in the usual sterile ophthalmic fashion.  A 1 millimeter clear-corneal paracentesis was made at the 12:00 position.  0.5 ml of preservative-free 1% lidocaine  was injected into the anterior chamber.    Vision Blue dye was then injected under the viscoelastic to stain the lens capsule.  Shugarcaine was then used to wash the dye out.  Viscoelastic was placed into the anterior chamber. A 2.4 millimeter keratome was used to make a near-clear corneal incision at the 9:00 position. A curvilinear capsulorrhexis was made with a cystotome and capsulorrhexis forceps.  Balanced salt solution was used to hydrodissect and hydrodelineate the nucleus.  Viscoat was then placed in the anterior chamber.   Phacoemulsification was then used in divide and conquer fashion to remove the lens nucleus and epinucleus.  The  remaining cortex was then removed using the irrigation and aspiration handpiece. Provisc was then placed into the capsular bag to distend it for lens placement.  A [id number] +21.0 -diopter lens was then injected into the capsular bag.  The remaining viscoelastic was aspirated.   Wounds were hydrated with balanced salt solution.  The anterior chamber was inflated to a physiologic pressure with balanced salt solution. Vigamox 0.2 ml of a 1mg  per ml solution was injected into the anterior chamber for a dose of 0.2 mg of intracameral antibiotic at the completion of the case.  No wound leaks were noted.  Topical Timolol and Brimonidine drops were applied to the eye.  The patient was taken to the recovery room in stable condition without complications of anesthesia or surgery.  I was present for the entire surgical procedure.  [name] 05/03/2024, 1:42 PM

## 2024-05-03 NOTE — H&P (Signed)
 [name]   Primary Care Physician:  Herold Hadassah SQUIBB, MD Ophthalmologist: Dr. Dene Etienne  Pre-Procedure History & Physical: HPI:  [name] is a 58 y.o. female here for ophthalmic surgery.   Past Medical History:  Diagnosis Date   Acute diverticulitis 08/21/2018   Anxiety    Arrhythmia    Arthritis    Atypical chest pain 08/09/2018   Cerebrovascular accident [facility]) 2010   without any focal neurological deficits.    Clotted renal dialysis AV graft    Compression of vein 08/16/2018   Dependence on hemodialysis 11/17/2013   Depression    Diabetes mellitus without complication (HCC)    Ductal carcinoma in situ (DCIS) of left breast    Dysrhythmia    palpitations with exertion   End stage renal disease (HCC)    a. previously on dialysis on Tues, Thurs, & 2024/05/27 x 7 yrs; b. s/p deceased donor transplant 2014-04-07    GERD (gastroesophageal reflux disease)    History of methicillin resistant Staphylococcus aureus infection    History of stress test    a. 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   MASTECTOMY PARTIAL / LUMPECTOMY Left 09/24/2015   MASTECTOMY PARTIAL / LUMPECTOMY Left 10/25/2015    Prior to Admission medications   Medication Sig Start Date End Date Taking? Authorizing Provider  aspirin  EC 81 MG tablet Take 81 mg by mouth daily.   Yes [provider]  carvedilol  (COREG ) 12.5  MG tablet Take 12.5 mg by mouth 2 (two) times daily with a meal.  08/06/16  Yes [provider]  cholecalciferol  (VITAMIN D ) 25 MCG (1000 UT) tablet Take 1,000 Units by mouth daily.   Yes [provider]  docusate sodium  (COLACE) 100 MG capsule Take 100 mg by mouth daily.   Yes [provider]  DULoxetine  (CYMBALTA ) 20 MG capsule Take 1 capsule (20 mg total) by mouth daily. 04/27/24  Yes Herold Hadassah SQUIBB, MD  fluticasone  (FLONASE ) 50 MCG/ACT nasal spray Two sprays each NOSTRIL twice a day. 06/19/20  Yes Johnson, Megan P, DO  gabapentin  (NEURONTIN ) 300 MG capsule Take 2 capsules (600 mg total) by mouth 3 (three) times daily. 02/17/19  Yes Johnson, Megan P, DO  glipiZIDE  (GLUCOTROL ) 5 MG tablet Take 5 mg by mouth daily before breakfast.   Yes [provider]  magnesium  oxide (MAG-OX) 400 MG tablet Take 400 mg by mouth daily.    Yes [provider]  mycophenolate  (MYFORTIC ) 180 MG EC tablet Take 360 mg by mouth 3 (three) times daily.    Yes [provider]  nitroGLYCERIN  (NITROSTAT ) 0.4 MG SL tablet Place 1 tablet (0.4 mg total) under the tongue every 5 (five) minutes x 3 doses as needed for chest pain. 08/09/18  Yes Laurence Bridegroom, MD  nortriptyline  (PAMELOR ) 10 MG  capsule Take 4 capsules (40 mg total) by mouth at bedtime. Takes 4 tablets at bedtime 04/27/24  Yes Herold Hadassah SQUIBB, MD  omeprazole  (PRILOSEC) 20 MG capsule Take 1 capsule (20 mg total) by mouth daily as needed (GERD or heartburn). 04/11/20  Yes Johnson, Megan P, DO  sodium bicarbonate  650 MG tablet Take 1,300 mg by mouth 3 (three) times daily.    Yes [provider]  tacrolimus  (PROGRAF ) 1 MG capsule Take 4 mg by mouth 2 (two) times daily.  06/06/14  Yes [provider]  tamoxifen  (NOLVADEX ) 20 MG tablet Take 20 mg by mouth daily. 12/07/16  Yes [provider]  valACYclovir  (VALTREX ) 1000 MG tablet Take 1 tablet (1,000 mg total) by mouth 2 (two) times daily. 02/26/20  Yes  Johnson, Megan P, DO  atorvastatin  (LIPITOR) 80 MG tablet Take 1 tablet (80 mg total) by mouth daily. 04/22/23 04/27/24  Herold Hadassah SQUIBB, MD    Allergies as of 04/18/2024 - Review Complete 02/06/2024  Allergen Reaction Noted   Heparin   12/08/2012   Penicillin g  12/09/2012   Ibuprofen  11/15/2013   Other  03/27/2013   Penicillins  11/15/2013    Family History  Problem Relation Age of Onset   Heart attack Mother    Hypertension Mother    Hyperlipidemia Mother    Cancer Mother        breast   Heart attack Father    Hypertension Father    Hyperlipidemia Father    Kidney failure Sister        half sister   Diabetes Maternal Grandmother    Hearing loss Maternal Grandmother    Heart failure Maternal Grandmother    Diabetes Maternal Grandfather    Stroke Maternal Grandfather    Cancer Paternal Grandmother        breast   Stroke Paternal Grandfather    Aneurysm Brother        Brain   Other Sister        Nerve problems    Social History   Socioeconomic History   Marital status: Single    Spouse name: Not on file   Number of children: 1   Years of education: Not on file   Highest education level: Not on file  Occupational History   Occupation: disability  Tobacco Use   Smoking status: Former    Current packs/day: 0.00    Average packs/day: 0.3 packs/day for 15.3 years (3.8 ttl pk-yrs)    Types: Cigarettes    Start date: 21    Quit date: 11/26/2003    Years since quitting: 20.4   Smokeless tobacco: Never  Vaping Use   Vaping status: Never Used  Substance and Sexual Activity   Alcohol use: Not Currently    Alcohol/week: 2.0 standard drinks of alcohol    Types: 1 Glasses of wine, 1 Cans of beer per week    Comment: wine or beer monthly or less   Drug use: Yes    Types: Crack cocaine, Marijuana    Comment: quit cocaine in 2003, smoked marijuana 08/2023   Sexual activity: Yes    Birth control/protection: Post-menopausal  Other Topics Concern   Not on file   Social History Narrative   Not on file   Social Drivers of Health   Financial Resource Strain: Low Risk  (01/25/2024)   Overall Financial Resource Strain (CARDIA)    Difficulty of Paying Living Expenses: Not hard at all  Food Insecurity: No Food Insecurity (01/25/2024)  Hunger Vital Sign    Worried About Running Out of Food in the Last Year: Never true    Ran Out of Food in the Last Year: Never true  Transportation Needs: No Transportation Needs (01/25/2024)   PRAPARE - Administrator, Civil Service (Medical): No    Lack of Transportation (Non-Medical): No  Physical Activity: Insufficiently Active (01/25/2024)   Exercise Vital Sign    Days of Exercise per Week: 3 days    Minutes of Exercise per Session: 20 min  Stress: No Stress Concern Present (01/25/2024)   [employer] of Occupational Health - Occupational Stress Questionnaire    Feeling of Stress: Only a little  Social Connections: Moderately Isolated (01/25/2024)   Social Connection and Isolation Panel    Frequency of Communication with Friends and Family: More than three times a week    Frequency of Social Gatherings with Friends and Family: More than three times a week    Attends Religious Services: More than 4 times per year    Active Member of [employer] or Organizations: No    Attends Banker Meetings: Never    Marital Status: Never married  Intimate Partner Violence: Not At Risk (01/25/2024)   Humiliation, Afraid, Rape, and Kick questionnaire    Fear of Current or Ex-Partner: No    Emotionally Abused: No    Physically Abused: No    Sexually Abused: No    Review of Systems: See HPI, otherwise negative ROS  Physical Exam: BP (!) 134/90   Pulse 84   Temp (!) 97.2 F (36.2 C) (Temporal)   Resp 14   Ht 5' 5 (1.651 m)   Wt 79.4 kg   LMP 03/03/2016 (Approximate)   SpO2 98%   BMI 29.12 kg/m  General:   Alert,  pleasant and cooperative in NAD Head:  Normocephalic and atraumatic. Lungs:   Clear to auscultation.    Heart:  Regular rate and rhythm.    Impression/Plan: [name] is here for ophthalmic surgery.  Risks, benefits, limitations, and alternatives regarding ophthalmic surgery have been reviewed with the patient.  Questions have been answered.  All parties agreeable.   MITTIE GASKIN, MD  05/03/2024, 12:39 PM

## 2024-05-03 NOTE — Anesthesia Postprocedure Evaluation (Signed)
 Anesthesia Post Note  Patient: [name]  Procedure(s) Performed: PHACOEMULSIFICATION, CATARACT, WITH IOL INSERTION 4.04, 00:38.7 (Right)  Patient location during evaluation: PACU Anesthesia Type: MAC Level of consciousness: awake and alert Pain management: pain level controlled Vital Signs Assessment: post-procedure vital signs reviewed and stable Respiratory status: spontaneous breathing, nonlabored ventilation, respiratory function stable and patient connected to nasal cannula oxygen Cardiovascular status: stable and blood pressure returned to baseline Postop Assessment: no apparent nausea or vomiting Anesthetic complications: no   No notable events documented.   Last Vitals:  Vitals:   05/03/24 1340 05/03/24 1342  BP:  131/86  Pulse: 73 75  Resp: 15 20  Temp:  (!) 36.2 C  SpO2: 97% 97%    Last Pain:  Vitals:   05/03/24 1342  TempSrc:   PainSc: 0-No pain                 Marvelous Bouwens C Libbi Towner

## 2024-05-03 NOTE — Transfer of Care (Signed)
 Immediate Anesthesia Transfer of Care Note  Patient: [name]  Procedure(s) Performed: PHACOEMULSIFICATION, CATARACT, WITH IOL INSERTION 4.04, 00:38.7 (Right)  Patient Location: PACU  Anesthesia Type: MAC  Level of Consciousness: awake, alert  and patient cooperative  Airway and Oxygen Therapy: Patient Spontanous Breathing   Post-op Assessment: Post-op Vital signs reviewed, Patient's Cardiovascular Status Stable, Respiratory Function Stable, Patent Airway and No signs of Nausea or vomiting  Post-op Vital Signs: Reviewed and stable  Complications: No notable events documented.

## 2024-05-06 DIAGNOSIS — Z940 Kidney transplant status: Principal | ICD-10-CM

## 2024-05-06 MED ORDER — TACROLIMUS 1 MG CAPSULE, IMMEDIATE-RELEASE
ORAL_CAPSULE | Freq: Two times a day (BID) | ORAL | 0 refills | 3.00000 days | Status: CP
Start: 2024-05-06 — End: 2025-05-06
  Filled 2024-05-06: qty 20, 3d supply, fill #0

## 2024-05-06 NOTE — Unmapped (Signed)
 Patient paged on call as she had eye surgery and is staying with her daughter and left her tacrolimus  in her own apartment and has lost the keys. She has called her maintenance but they will not be able to help until Monday. She needs 5 doses of tacrolimus . I sent a prescription to [facility], insurance denied a lost prescription request but we were able to use a savings card for $8.99. Pt is in agreement with that. She will come to [facility] today by 3pm and get the medications. She will call back if any issues.

## 2024-05-08 ENCOUNTER — Encounter: Payer: Self-pay | Admitting: Pediatrics

## 2024-05-08 DIAGNOSIS — F419 Anxiety disorder, unspecified: Secondary | ICD-10-CM | POA: Insufficient documentation

## 2024-05-08 NOTE — Assessment & Plan Note (Signed)
 Provided letter to help move apartments.

## 2024-05-08 NOTE — Patient Instructions (Signed)
 You have an order for:  []   2D Mammogram  [x]   3D Mammogram  []   Bone Density     Please call for appointment:    Make sure to wear two-piece clothing.  No lotions, powders, or deodorants the day of the appointment. Make sure to bring picture ID and insurance card.  Bring list of medications you are currently taking including any supplements.   Schedule your [facility] screening mammogram through MyChart!   Log into your MyChart account.  Go to 'Visit' (or 'Appointments' if on mobile App) --> Schedule an Appointment  Under 'Select a Reason for Visit' choose the Mammogram Screening option.  Complete the pre-visit questions and select the time and place that best fits your schedule.

## 2024-05-08 NOTE — Assessment & Plan Note (Signed)
 Due for repeat blood work. On glipizide  and statin.

## 2024-05-09 ENCOUNTER — Ambulatory Visit: Admitting: Pediatrics

## 2024-05-11 NOTE — Progress Notes (Signed)
 Jadelyn D Riden                                          MRN: [id number]   05/11/2024   The VBCI Quality Team Specialist reviewed this patient medical record for the purposes of chart review for care gap closure. The following were reviewed: abstraction for care gap closure-glycemic status assessment.    VBCI Quality Team

## 2024-05-19 NOTE — Unmapped (Signed)
 [facility] Specialty and Home Delivery Pharmacy Refill Coordination Note    Specialty Medication(s) to be Shipped:   Transplant: mycophenolate  mofetil 180mg  and tacrolimus  1mg     Other medication(s) to be shipped: No additional medications requested for fill at this time    Specialty Medications not needed at this time: N/A     Nancy Bowman, DOB: [birth year]  Phone: [phone] (home) [phone] (work)      All above HIPAA information was verified with patient.     Was a Nurse, learning disability used for this call? No    Completed refill call assessment today to schedule patient's medication shipment from the [facility] and Home Delivery Pharmacy  [phone]).  All relevant notes have been reviewed.     Specialty medication(s) and dose(s) confirmed: Regimen is correct and unchanged.   Changes to medications: [name] reports no changes at this time.  Changes to insurance: No  New side effects reported not previously addressed with a pharmacist or physician: None reported  Questions for the pharmacist: No    Confirmed patient received a Conservation officer, historic buildings and a Surveyor, mining with first shipment. The patient will receive a drug information handout for each medication shipped and additional FDA Medication Guides as required.       DISEASE/MEDICATION-SPECIFIC INFORMATION        N/A    SPECIALTY MEDICATION ADHERENCE     Medication Adherence    Patient reported X missed doses in the last month: 0  Specialty Medication: mycophenolate  180 MG EC tablet (MYFORTIC )  Patient is on additional specialty medications: Yes  Additional Specialty Medications: tacrolimus  1 MG capsule (PROGRAF )  Patient Reported Additional Medication X Missed Doses in the Last Month: 0  Patient is on more than two specialty medications: No  Any gaps in refill history greater than 2 weeks in the last 3 months: no  Demonstrates understanding of importance of adherence: yes  Informant: patient  Adherence tools used: patient uses a pill box to manage medications  Confirmed plan for next specialty medication refill: delivery by pharmacy  Refills needed for supportive medications: not needed          Refill Coordination    Has the Patients' Contact Information Changed: No  Is the Shipping Address Different: No         Were doses missed due to medication being on hold? No    tacrolimus  1 mg: 5 days of medicine on hand   mycophenolate  180  mg: 5 days of medicine on hand       REFERRAL TO PHARMACIST     Referral to the pharmacist: Not needed      [facility]     Shipping address confirmed in Epic.     Cost and Payment: Patient has a $0 copay, payment information is not required.    Delivery Scheduled: Yes, Expected medication delivery date: 05/23/24.     Medication will be delivered via Next Day Courier to the prescription address in Epic WAM.    Nancy Bowman   [facility] Specialty and Home Delivery Pharmacy  Specialty Technician

## 2024-05-22 MED FILL — TACROLIMUS 1 MG CAPSULE, IMMEDIATE-RELEASE: ORAL | 30 days supply | Qty: 240 | Fill #3

## 2024-05-22 MED FILL — MYCOPHENOLATE SODIUM 180 MG TABLET,DELAYED RELEASE: ORAL | 30 days supply | Qty: 180 | Fill #3

## 2024-05-24 ENCOUNTER — Other Ambulatory Visit: Payer: Self-pay | Admitting: Pediatrics

## 2024-05-24 DIAGNOSIS — G8929 Other chronic pain: Secondary | ICD-10-CM

## 2024-05-26 NOTE — Telephone Encounter (Signed)
 Requested Prescriptions  Pending Prescriptions Disp Refills   nortriptyline  (PAMELOR ) 10 MG capsule [Pharmacy Med Name: NORTRIPTYLINE  HCL 10 MG CAP] 120 capsule 0    Sig: Take 4 capsules (40 mg total) by mouth at bedtime.     Psychiatry:  Antidepressants - Heterocyclics (TCAs) Passed - 05/26/2024 12:03 PM      Passed - Valid encounter within last 6 months    Recent Outpatient Visits           4 weeks ago Type 2 diabetes mellitus with stage 3 chronic kidney disease, without long-term current use of insulin , unspecified whether stage 3a or 3b CKD (HCC)   [facility] Herold Hadassah SQUIBB, MD   3 months ago Prediabetes   [facility] Herold Hadassah SQUIBB, MD

## 2024-06-05 DIAGNOSIS — Z940 Kidney transplant status: Principal | ICD-10-CM

## 2024-06-05 DIAGNOSIS — E559 Vitamin D deficiency, unspecified: Principal | ICD-10-CM

## 2024-06-05 DIAGNOSIS — R799 Abnormal finding of blood chemistry, unspecified: Principal | ICD-10-CM

## 2024-06-05 DIAGNOSIS — N186 End stage renal disease: Principal | ICD-10-CM

## 2024-06-15 NOTE — Progress Notes (Signed)
 [facility] Specialty and Home Delivery Pharmacy Refill Coordination Note    Specialty Medication(s) to be Shipped:   Transplant: mycophenolic acid 180mg  and tacrolimus  1mg     Other medication(s) to be shipped: No additional medications requested for fill at this time    Specialty Medications not needed at this time: N/A     Nancy Bowman, DOB: 1966/07/02  Phone: [phone] (home) [phone] (work)      All above HIPAA information was verified with patient.     Was a nurse, learning disability used for this call? No    Completed refill call assessment today to schedule patient's medication shipment from the [facility] and Home Delivery Pharmacy  [phone]).  All relevant notes have been reviewed.     Specialty medication(s) and dose(s) confirmed: Regimen is correct and unchanged.   Changes to medications: Milanie reports no changes at this time.  Changes to insurance: No  New side effects reported not previously addressed with a pharmacist or physician: None reported  Questions for the pharmacist: No    Confirmed patient received a Conservation Officer, Historic Buildings and a Surveyor, Mining with first shipment. The patient will receive a drug information handout for each medication shipped and additional FDA Medication Guides as required.       DISEASE/MEDICATION-SPECIFIC INFORMATION        N/A    SPECIALTY MEDICATION ADHERENCE     Medication Adherence    Patient reported X missed doses in the last month: 0  Specialty Medication: tacrolimus  1 MG capsule (PROGRAF )  Patient is on additional specialty medications: Yes  Additional Specialty Medications: mycophenolate  180 MG EC tablet (MYFORTIC )  Patient Reported Additional Medication X Missed Doses in the Last Month: 0  Adherence tools used: patient uses a pill box to manage medications              Were doses missed due to medication being on hold? No     mycophenolate  180 MG EC tablet (MYFORTIC ): 1 days of medicine on hand    tacrolimus  1 MG capsule (PROGRAF ): 1 days of medicine on hand       REFERRAL TO PHARMACIST     Referral to the pharmacist: Not needed      Nancy Bowman     Shipping address confirmed in Epic.     Cost and Payment: Patient has a $0 copay, payment information is not required.    Delivery Scheduled: Yes, Expected medication delivery date: 06/16/2024.     Medication will be delivered via Same Day Courier to the prescription address in Epic WAM.    Nancy Bowman   [facility] Specialty and Home Delivery Pharmacy  Specialty Technician

## 2024-06-16 MED FILL — TACROLIMUS 1 MG CAPSULE, IMMEDIATE-RELEASE: ORAL | 30 days supply | Qty: 240 | Fill #4

## 2024-06-16 MED FILL — MYCOPHENOLATE SODIUM 180 MG TABLET,DELAYED RELEASE: ORAL | 30 days supply | Qty: 180 | Fill #4

## 2024-06-21 ENCOUNTER — Ambulatory Visit: Admitting: Pediatrics

## 2024-06-28 DIAGNOSIS — D0512 Intraductal carcinoma in situ of left breast: Principal | ICD-10-CM

## 2024-06-28 MED ORDER — TAMOXIFEN 20 MG TABLET
ORAL_TABLET | Freq: Every day | ORAL | 0 refills | 90.00000 days
Start: 2024-06-28 — End: ?

## 2024-07-04 MED ORDER — TAMOXIFEN 20 MG TABLET
ORAL_TABLET | Freq: Every day | ORAL | 0 refills | 90.00000 days | Status: CP
Start: 2024-07-04 — End: ?

## 2024-07-14 MED FILL — TACROLIMUS 1 MG CAPSULE, IMMEDIATE-RELEASE: ORAL | 30 days supply | Qty: 240 | Fill #5

## 2024-07-14 MED FILL — MYCOPHENOLATE SODIUM 180 MG TABLET,DELAYED RELEASE: ORAL | 30 days supply | Qty: 180 | Fill #5

## 2024-07-14 NOTE — Progress Notes (Signed)
 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     Was a nurse, learning disability used for this call? No    Completed refill call assessment today to schedule patient's medication shipment from the [facility]  [phone]).  All relevant notes have been reviewed.     Specialty medication(s) and dose(s) confirmed: Regimen is correct and unchanged.   Changes to medications: Bryn reports no changes at this time.  Changes to insurance: No  New side effects reported not previously addressed with a pharmacist or physician: None reported  Questions for the pharmacist: No    Confirmed patient received a Conservation Officer, Historic Buildings and a Surveyor, Mining with first shipment. The patient will receive a drug information handout for each medication shipped and additional FDA Medication Guides as required.       DISEASE/MEDICATION-SPECIFIC INFORMATION        N/A    SPECIALTY MEDICATION ADHERENCE     Medication Adherence    Patient reported X missed doses in the last month: 0  Specialty Medication: tacrolimus  1 MG capsule (PROGRAF )  Patient is on additional specialty medications: Yes  Additional Specialty Medications: mycophenolate  180 MG EC tablet (MYFORTIC )  Patient is on more than two specialty medications: No  Informant: patient  Adherence tools used: patient uses a pill box to manage medications              Were doses missed due to medication being on hold? No     mycophenolate  180 MG EC tablet (MYFORTIC ):  3 days of medicine on hand   tacrolimus  1 MG capsule (PROGRAF ): 3 days of medicine on hand       Specialty medication is an injection or given on a cycle: No    REFERRAL TO PHARMACIST     Referral to the pharmacist: Not needed      [facility]     Shipping address confirmed in Epic.     Cost and Payment: Patient has a $0 copay, payment information is not required.    Delivery Scheduled: Yes, Expected medication delivery date: 12/15.     Medication will be delivered via Next Day Courier to the prescription address in Epic WAM.    Jeoffrey JAYSON [facility]

## 2024-08-14 DIAGNOSIS — D0512 Intraductal carcinoma in situ of left breast: Principal | ICD-10-CM

## 2024-08-14 MED ORDER — TAMOXIFEN 20 MG TABLET
ORAL_TABLET | Freq: Every day | ORAL | 0 refills | 90.00000 days
Start: 2024-08-14 — End: ?

## 2024-08-14 NOTE — Telephone Encounter (Unsigned)
 Copied from CRM [id number]. Topic: Clinical - Medication Refill >> Aug 14, 2024 11:22 AM Olam RAMAN wrote: Medication: glipiZIDE  (GLUCOTROL ) 5 MG tablet Needs 10 mg per provider  Has the patient contacted their pharmacy? Yes (Agent: If no, request that the patient contact the pharmacy for the refill. If patient does not wish to contact the pharmacy document the reason why and proceed with request.) (Agent: If yes, when and what did the pharmacy advise?)  This is the patient's preferred pharmacy:  [facility] - [place], [place] - [address] [place] [address] Phone: [phone] Fax: [phone]  Is this the correct pharmacy for this prescription? Yes If no, delete pharmacy and type the correct one.   Has the prescription been filled recently? Yes  Is the patient out of the medication? Yes  Has the patient been seen for an appointment in the last year OR does the patient have an upcoming appointment? Yes  Can we respond through MyChart? Yes  Agent: Please be advised that Rx refills may take up to 3 business days. We ask that you follow-up with your pharmacy.

## 2024-08-14 NOTE — Telephone Encounter (Signed)
 Over due for follow up

## 2024-08-14 NOTE — Telephone Encounter (Signed)
 Called to schedule follow up/TOC but the mailbox is full.

## 2024-08-16 NOTE — Telephone Encounter (Signed)
 2 attempt to reach patient for [facility] scheduling. Mailbox is full.

## 2024-08-17 NOTE — Telephone Encounter (Signed)
 Scheduled for an acute visit for ear pain with Marcelline Benders on 1/19 and scheduled TOC for 5/22 with Dr Vicci

## 2024-08-17 NOTE — Progress Notes (Signed)
 [facility] Refill Coordination Note    Specialty Medication(s) to be Shipped:   Transplant: mycophenolate  mofetil 180mg  and tacrolimus  1mg     Other medication(s) to be shipped: No additional medications requested for fill at this time    Specialty Medications not needed at this time: N/A     Nancy Bowman, DOB: 08-May-1966  Phone: [phone] (home) [phone] (work)      All above HIPAA information was verified with patient.     Was a nurse, learning disability used for this call? No    Completed refill call assessment today to schedule patient's medication shipment from the [facility]  [phone]).  All relevant notes have been reviewed.     Specialty medication(s) and dose(s) confirmed: Regimen is correct and unchanged.   Changes to medications: Bryella reports no changes at this time.  Changes to insurance: No  New side effects reported not previously addressed with a pharmacist or physician: None reported  Questions for the pharmacist: No    Confirmed patient received a Conservation Officer, Historic Buildings and a Surveyor, Mining with first shipment. The patient will receive a drug information handout for each medication shipped and additional FDA Medication Guides as required.       DISEASE/MEDICATION-SPECIFIC INFORMATION        N/A    SPECIALTY MEDICATION ADHERENCE     Medication Adherence    Patient reported X missed doses in the last month: 0  Specialty Medication: mycophenolate  180 MG EC tablet (MYFORTIC )  Patient is on additional specialty medications: Yes  Additional Specialty Medications: tacrolimus  1 MG capsule (PROGRAF )  Patient Reported Additional Medication X Missed Doses in the Last Month: 0  Patient is on more than two specialty medications: No  Adherence tools used: patient uses a pill box to manage medications              Were doses missed due to medication being on hold? No    tacrolimus  1 MG capsule (PROGRAF )  : 4-5 days of medicine on hand mycophenolate  180 MG EC tablet (MYFORTIC ): 4-5 days of medicine on hand       Specialty medication is an injection or given on a cycle: No    REFERRAL TO PHARMACIST     Referral to the pharmacist: Not needed      [facility]     Shipping address confirmed in Epic.     Cost and Payment: Patient has a $0 copay, payment information is not required.    Delivery Scheduled: Yes, Expected medication delivery date: 08/23/24.     Medication will be delivered via Next Day Courier to the prescription address in Epic WAM.    [facility]  Specialty Technician

## 2024-08-21 ENCOUNTER — Ambulatory Visit

## 2024-08-22 MED FILL — MYCOPHENOLATE SODIUM 180 MG TABLET,DELAYED RELEASE: ORAL | 90 days supply | Qty: 540 | Fill #6

## 2024-08-22 MED FILL — TACROLIMUS 1 MG CAPSULE, IMMEDIATE-RELEASE: ORAL | 90 days supply | Qty: 720 | Fill #6

## 2024-08-23 ENCOUNTER — Ambulatory Visit

## 2024-09-01 DIAGNOSIS — D0512 Intraductal carcinoma in situ of left breast: Principal | ICD-10-CM

## 2024-09-01 MED ORDER — TAMOXIFEN 20 MG TABLET
ORAL_TABLET | Freq: Every day | ORAL | 0 refills | 90.00000 days | Status: CP
Start: 2024-09-01 — End: ?

## 2024-09-01 NOTE — Progress Notes (Signed)
 Received notification from the attending requesting NN to call patient to schedule a return appointment due the medication request.  The patient was last seen in the [facility] in 2019.      Background:Recently, she called in requesting a renewal for her Tamoxifen .   The prescription was e-scripted to her preferred pharmacy on 08/14/24 by the provider.  The preferred pharmacy called the Call Center relaying prior authorization was needed.      NN attempted to call the patient on this date as requested.  Unable to contact and left a message identifying self and the purpose of the call.  Requested return call and this writer's office number was provided.

## 2024-12-22 ENCOUNTER — Encounter: Admitting: Family Medicine

## 2025-01-30 ENCOUNTER — Ambulatory Visit
